# Patient Record
Sex: Female | Born: 1940 | Race: Black or African American | Hispanic: No | Smoking: Never smoker
Health system: Southern US, Community
[De-identification: ages and names within clinical notes are randomized; demographics above are authoritative.]

## PROBLEM LIST (undated history)

## (undated) DIAGNOSIS — S82891A Other fracture of right lower leg, initial encounter for closed fracture: Secondary | ICD-10-CM

## (undated) DIAGNOSIS — F329 Major depressive disorder, single episode, unspecified: Secondary | ICD-10-CM

## (undated) DIAGNOSIS — D696 Thrombocytopenia, unspecified: Secondary | ICD-10-CM

## (undated) DIAGNOSIS — F32A Depression, unspecified: Secondary | ICD-10-CM

## (undated) DIAGNOSIS — IMO0002 Reserved for concepts with insufficient information to code with codable children: Secondary | ICD-10-CM

## (undated) DIAGNOSIS — D472 Monoclonal gammopathy: Secondary | ICD-10-CM

## (undated) DIAGNOSIS — K219 Gastro-esophageal reflux disease without esophagitis: Secondary | ICD-10-CM

## (undated) DIAGNOSIS — F419 Anxiety disorder, unspecified: Secondary | ICD-10-CM

## (undated) DIAGNOSIS — R161 Splenomegaly, not elsewhere classified: Secondary | ICD-10-CM

## (undated) DIAGNOSIS — M1990 Unspecified osteoarthritis, unspecified site: Secondary | ICD-10-CM

## (undated) DIAGNOSIS — E669 Obesity, unspecified: Secondary | ICD-10-CM

## (undated) DIAGNOSIS — E7800 Pure hypercholesterolemia, unspecified: Secondary | ICD-10-CM

## (undated) DIAGNOSIS — K7581 Nonalcoholic steatohepatitis (NASH): Secondary | ICD-10-CM

## (undated) DIAGNOSIS — M797 Fibromyalgia: Secondary | ICD-10-CM

## (undated) HISTORY — DX: Pure hypercholesterolemia, unspecified: E78.00

## (undated) HISTORY — PX: ABDOMINAL HYSTERECTOMY: SHX81

## (undated) HISTORY — DX: Nonalcoholic steatohepatitis (NASH): K75.81

## (undated) HISTORY — DX: Other fracture of right lower leg, initial encounter for closed fracture: S82.891A

## (undated) HISTORY — DX: Thrombocytopenia, unspecified: D69.6

## (undated) HISTORY — DX: Depression, unspecified: F32.A

## (undated) HISTORY — DX: Reserved for concepts with insufficient information to code with codable children: IMO0002

## (undated) HISTORY — DX: Major depressive disorder, single episode, unspecified: F32.9

## (undated) HISTORY — DX: Anxiety disorder, unspecified: F41.9

## (undated) HISTORY — DX: Obesity, unspecified: E66.9

## (undated) HISTORY — DX: Gastro-esophageal reflux disease without esophagitis: K21.9

## (undated) HISTORY — DX: Monoclonal gammopathy: D47.2

## (undated) HISTORY — PX: PARTIAL HYSTERECTOMY: SHX80

## (undated) HISTORY — DX: Unspecified osteoarthritis, unspecified site: M19.90

## (undated) HISTORY — DX: Fibromyalgia: M79.7

## (undated) HISTORY — DX: Splenomegaly, not elsewhere classified: R16.1

---

## 1980-04-01 HISTORY — PX: BREAST BIOPSY: SHX20

## 1997-07-08 ENCOUNTER — Other Ambulatory Visit: Admission: RE | Admit: 1997-07-08 | Discharge: 1997-07-08 | Payer: Self-pay | Admitting: Gynecology

## 1998-05-24 ENCOUNTER — Other Ambulatory Visit: Admission: RE | Admit: 1998-05-24 | Discharge: 1998-05-24 | Payer: Self-pay | Admitting: Gynecology

## 1998-07-24 ENCOUNTER — Ambulatory Visit (HOSPITAL_COMMUNITY): Admission: RE | Admit: 1998-07-24 | Discharge: 1998-07-24 | Payer: Self-pay | Admitting: Gastroenterology

## 1998-07-24 ENCOUNTER — Encounter: Payer: Self-pay | Admitting: Gastroenterology

## 1998-08-10 ENCOUNTER — Ambulatory Visit (HOSPITAL_COMMUNITY): Admission: RE | Admit: 1998-08-10 | Discharge: 1998-08-10 | Payer: Self-pay | Admitting: Gastroenterology

## 1998-08-25 ENCOUNTER — Ambulatory Visit (HOSPITAL_COMMUNITY): Admission: RE | Admit: 1998-08-25 | Discharge: 1998-08-25 | Payer: Self-pay | Admitting: Gastroenterology

## 1999-10-26 ENCOUNTER — Other Ambulatory Visit: Admission: RE | Admit: 1999-10-26 | Discharge: 1999-10-26 | Payer: Self-pay | Admitting: Gynecology

## 2000-02-18 ENCOUNTER — Encounter: Payer: Self-pay | Admitting: Emergency Medicine

## 2000-02-18 ENCOUNTER — Emergency Department (HOSPITAL_COMMUNITY): Admission: EM | Admit: 2000-02-18 | Discharge: 2000-02-18 | Payer: Self-pay

## 2000-11-06 ENCOUNTER — Other Ambulatory Visit: Admission: RE | Admit: 2000-11-06 | Discharge: 2000-11-06 | Payer: Self-pay | Admitting: Gynecology

## 2001-07-27 ENCOUNTER — Ambulatory Visit (HOSPITAL_COMMUNITY): Admission: RE | Admit: 2001-07-27 | Discharge: 2001-07-27 | Payer: Self-pay | Admitting: Pulmonary Disease

## 2001-10-27 ENCOUNTER — Encounter: Payer: Self-pay | Admitting: Neurosurgery

## 2001-10-27 ENCOUNTER — Encounter: Admission: RE | Admit: 2001-10-27 | Discharge: 2001-10-27 | Payer: Self-pay | Admitting: Neurosurgery

## 2001-11-10 ENCOUNTER — Encounter: Admission: RE | Admit: 2001-11-10 | Discharge: 2001-11-10 | Payer: Self-pay | Admitting: Neurosurgery

## 2001-11-10 ENCOUNTER — Encounter: Payer: Self-pay | Admitting: Neurosurgery

## 2002-09-02 ENCOUNTER — Other Ambulatory Visit: Admission: RE | Admit: 2002-09-02 | Discharge: 2002-09-02 | Payer: Self-pay | Admitting: Gynecology

## 2002-12-18 ENCOUNTER — Encounter: Payer: Self-pay | Admitting: Emergency Medicine

## 2002-12-18 ENCOUNTER — Emergency Department (HOSPITAL_COMMUNITY): Admission: EM | Admit: 2002-12-18 | Discharge: 2002-12-18 | Payer: Self-pay | Admitting: Emergency Medicine

## 2003-01-27 ENCOUNTER — Ambulatory Visit (HOSPITAL_COMMUNITY): Admission: RE | Admit: 2003-01-27 | Discharge: 2003-01-27 | Payer: Self-pay | Admitting: Internal Medicine

## 2003-03-24 ENCOUNTER — Ambulatory Visit (HOSPITAL_COMMUNITY): Admission: RE | Admit: 2003-03-24 | Discharge: 2003-03-24 | Payer: Self-pay | Admitting: Family Medicine

## 2003-04-18 ENCOUNTER — Ambulatory Visit (HOSPITAL_COMMUNITY): Admission: RE | Admit: 2003-04-18 | Discharge: 2003-04-18 | Payer: Self-pay | Admitting: Pulmonary Disease

## 2003-06-03 ENCOUNTER — Ambulatory Visit (HOSPITAL_COMMUNITY): Admission: RE | Admit: 2003-06-03 | Discharge: 2003-06-03 | Payer: Self-pay | Admitting: Cardiology

## 2003-09-05 ENCOUNTER — Other Ambulatory Visit: Admission: RE | Admit: 2003-09-05 | Discharge: 2003-09-05 | Payer: Self-pay | Admitting: Gynecology

## 2004-02-29 ENCOUNTER — Other Ambulatory Visit: Admission: RE | Admit: 2004-02-29 | Discharge: 2004-02-29 | Payer: Self-pay | Admitting: Gynecology

## 2004-04-03 ENCOUNTER — Ambulatory Visit (HOSPITAL_COMMUNITY): Admission: RE | Admit: 2004-04-03 | Discharge: 2004-04-03 | Payer: Self-pay | Admitting: Family Medicine

## 2004-06-27 ENCOUNTER — Ambulatory Visit: Payer: Self-pay | Admitting: Orthopedic Surgery

## 2004-08-09 ENCOUNTER — Ambulatory Visit: Payer: Self-pay | Admitting: Orthopedic Surgery

## 2004-09-10 ENCOUNTER — Ambulatory Visit: Payer: Self-pay | Admitting: Orthopedic Surgery

## 2004-10-05 ENCOUNTER — Other Ambulatory Visit: Admission: RE | Admit: 2004-10-05 | Discharge: 2004-10-05 | Payer: Self-pay | Admitting: Gynecology

## 2004-12-17 ENCOUNTER — Ambulatory Visit: Payer: Self-pay | Admitting: Orthopedic Surgery

## 2004-12-19 ENCOUNTER — Ambulatory Visit (HOSPITAL_COMMUNITY): Admission: RE | Admit: 2004-12-19 | Discharge: 2004-12-19 | Payer: Self-pay | Admitting: Orthopedic Surgery

## 2004-12-24 ENCOUNTER — Ambulatory Visit: Payer: Self-pay | Admitting: Orthopedic Surgery

## 2005-02-04 ENCOUNTER — Ambulatory Visit: Payer: Self-pay | Admitting: Orthopedic Surgery

## 2005-07-31 ENCOUNTER — Encounter: Payer: Self-pay | Admitting: Gastroenterology

## 2005-10-07 ENCOUNTER — Other Ambulatory Visit: Admission: RE | Admit: 2005-10-07 | Discharge: 2005-10-07 | Payer: Self-pay | Admitting: Gynecology

## 2005-10-28 ENCOUNTER — Ambulatory Visit (HOSPITAL_COMMUNITY): Admission: RE | Admit: 2005-10-28 | Discharge: 2005-10-28 | Payer: Self-pay | Admitting: Pulmonary Disease

## 2005-11-14 ENCOUNTER — Encounter (HOSPITAL_COMMUNITY): Admission: RE | Admit: 2005-11-14 | Discharge: 2005-12-14 | Payer: Self-pay | Admitting: Neurosurgery

## 2005-12-16 ENCOUNTER — Encounter (HOSPITAL_COMMUNITY): Admission: RE | Admit: 2005-12-16 | Discharge: 2005-12-28 | Payer: Self-pay | Admitting: Neurosurgery

## 2006-10-21 ENCOUNTER — Other Ambulatory Visit: Admission: RE | Admit: 2006-10-21 | Discharge: 2006-10-21 | Payer: Self-pay | Admitting: Gynecology

## 2006-12-16 ENCOUNTER — Encounter: Admission: RE | Admit: 2006-12-16 | Discharge: 2006-12-16 | Payer: Self-pay | Admitting: Gastroenterology

## 2007-12-20 ENCOUNTER — Emergency Department (HOSPITAL_COMMUNITY): Admission: EM | Admit: 2007-12-20 | Discharge: 2007-12-20 | Payer: Self-pay | Admitting: Emergency Medicine

## 2008-08-30 HISTORY — PX: COLONOSCOPY: SHX174

## 2008-10-21 ENCOUNTER — Ambulatory Visit: Payer: Self-pay | Admitting: Gynecology

## 2008-10-21 ENCOUNTER — Other Ambulatory Visit: Admission: RE | Admit: 2008-10-21 | Discharge: 2008-10-21 | Payer: Self-pay | Admitting: Gynecology

## 2008-10-21 ENCOUNTER — Encounter: Payer: Self-pay | Admitting: Gynecology

## 2008-11-08 ENCOUNTER — Ambulatory Visit: Payer: Self-pay | Admitting: Gynecology

## 2009-10-24 ENCOUNTER — Ambulatory Visit: Payer: Self-pay | Admitting: Gynecology

## 2009-10-24 ENCOUNTER — Other Ambulatory Visit: Admission: RE | Admit: 2009-10-24 | Discharge: 2009-10-24 | Payer: Self-pay | Admitting: Gynecology

## 2010-03-23 ENCOUNTER — Emergency Department (HOSPITAL_COMMUNITY)
Admission: EM | Admit: 2010-03-23 | Discharge: 2010-03-23 | Payer: Self-pay | Source: Home / Self Care | Admitting: Emergency Medicine

## 2010-04-28 ENCOUNTER — Emergency Department (HOSPITAL_COMMUNITY)
Admission: EM | Admit: 2010-04-28 | Discharge: 2010-04-29 | Payer: Self-pay | Source: Home / Self Care | Admitting: Emergency Medicine

## 2010-05-21 DIAGNOSIS — S82891A Other fracture of right lower leg, initial encounter for closed fracture: Secondary | ICD-10-CM

## 2010-05-21 DIAGNOSIS — IMO0002 Reserved for concepts with insufficient information to code with codable children: Secondary | ICD-10-CM

## 2010-05-21 HISTORY — DX: Reserved for concepts with insufficient information to code with codable children: IMO0002

## 2010-05-21 HISTORY — DX: Other fracture of right lower leg, initial encounter for closed fracture: S82.891A

## 2010-05-24 ENCOUNTER — Ambulatory Visit (HOSPITAL_COMMUNITY)
Admission: RE | Admit: 2010-05-24 | Discharge: 2010-05-24 | Disposition: A | Discharge status: Home / Self Care | Payer: Medicare Other | Source: Ambulatory Visit | Attending: Pulmonary Disease | Admitting: Pulmonary Disease

## 2010-05-24 ENCOUNTER — Other Ambulatory Visit (HOSPITAL_COMMUNITY): Payer: Self-pay | Admitting: Pulmonary Disease

## 2010-05-24 DIAGNOSIS — M545 Low back pain: Secondary | ICD-10-CM

## 2010-05-24 DIAGNOSIS — M8448XA Pathological fracture, other site, initial encounter for fracture: Secondary | ICD-10-CM | POA: Insufficient documentation

## 2010-05-24 DIAGNOSIS — M549 Dorsalgia, unspecified: Secondary | ICD-10-CM | POA: Insufficient documentation

## 2010-05-25 ENCOUNTER — Other Ambulatory Visit (HOSPITAL_COMMUNITY): Payer: Self-pay | Admitting: Pulmonary Disease

## 2010-05-25 DIAGNOSIS — IMO0002 Reserved for concepts with insufficient information to code with codable children: Secondary | ICD-10-CM

## 2010-05-28 ENCOUNTER — Ambulatory Visit (HOSPITAL_COMMUNITY)
Admission: RE | Admit: 2010-05-28 | Discharge: 2010-05-28 | Payer: Medicare Other | Source: Ambulatory Visit | Attending: Pulmonary Disease | Admitting: Pulmonary Disease

## 2010-06-10 ENCOUNTER — Emergency Department (HOSPITAL_COMMUNITY): Payer: Medicare Other

## 2010-06-10 ENCOUNTER — Emergency Department (HOSPITAL_COMMUNITY)
Admission: EM | Admit: 2010-06-10 | Discharge: 2010-06-10 | Disposition: A | Discharge status: Home / Self Care | Payer: Medicare Other | Attending: Emergency Medicine | Admitting: Emergency Medicine

## 2010-06-10 DIAGNOSIS — R51 Headache: Secondary | ICD-10-CM | POA: Insufficient documentation

## 2010-06-10 DIAGNOSIS — F411 Generalized anxiety disorder: Secondary | ICD-10-CM | POA: Insufficient documentation

## 2010-06-10 DIAGNOSIS — E7800 Pure hypercholesterolemia, unspecified: Secondary | ICD-10-CM | POA: Insufficient documentation

## 2010-06-10 DIAGNOSIS — R079 Chest pain, unspecified: Secondary | ICD-10-CM | POA: Insufficient documentation

## 2010-06-10 DIAGNOSIS — R0602 Shortness of breath: Secondary | ICD-10-CM | POA: Insufficient documentation

## 2010-06-10 DIAGNOSIS — M129 Arthropathy, unspecified: Secondary | ICD-10-CM | POA: Insufficient documentation

## 2010-06-10 DIAGNOSIS — R5381 Other malaise: Secondary | ICD-10-CM | POA: Insufficient documentation

## 2010-06-10 DIAGNOSIS — R5383 Other fatigue: Secondary | ICD-10-CM | POA: Insufficient documentation

## 2010-06-10 DIAGNOSIS — Z79899 Other long term (current) drug therapy: Secondary | ICD-10-CM | POA: Insufficient documentation

## 2010-06-10 DIAGNOSIS — R0989 Other specified symptoms and signs involving the circulatory and respiratory systems: Secondary | ICD-10-CM | POA: Insufficient documentation

## 2010-06-10 DIAGNOSIS — R0609 Other forms of dyspnea: Secondary | ICD-10-CM | POA: Insufficient documentation

## 2010-06-10 LAB — BASIC METABOLIC PANEL
BUN: 11 mg/dL (ref 6–23)
CO2: 27 mEq/L (ref 19–32)
Calcium: 9.3 mg/dL (ref 8.4–10.5)
Chloride: 105 mEq/L (ref 96–112)
Creatinine, Ser: 0.85 mg/dL (ref 0.4–1.2)
GFR calc Af Amer: >60 mL/min (ref >60)
GFR calc non Af Amer: >60 mL/min (ref >60)
Glucose, Bld: 106 mg/dL — ABNORMAL HIGH (ref 70–99)
Potassium: 3.8 mEq/L (ref 3.5–5.1)
Sodium: 140 mEq/L (ref 135–145)

## 2010-06-10 LAB — DIFFERENTIAL
Basophils Absolute: 0 10*3/uL (ref 0.0–0.1)
Basophils Relative: 0 % (ref 0–1)
Eosinophils Relative: 0 % (ref 0–5)
Lymphocytes Relative: 1 % — ABNORMAL LOW (ref 12–46)
Lymphs Abs: 0.1 10*3/uL — ABNORMAL LOW (ref 0.7–4.0)
Monocytes Absolute: 0.3 10*3/uL (ref 0.1–1.0)
Monocytes Relative: 3 % (ref 3–12)

## 2010-06-10 LAB — POCT CARDIAC MARKERS
CKMB, poc: <1 ng/mL — ABNORMAL LOW (ref 1.0–8.0)
CKMB, poc: <1 ng/mL — ABNORMAL LOW (ref 1.0–8.0)
Myoglobin, poc: 77.3 ng/mL (ref 12–200)
Troponin i, poc: <0.05 ng/mL (ref 0.00–0.09)
Troponin i, poc: <0.05 ng/mL (ref 0.00–0.09)

## 2010-06-10 LAB — CBC
HCT: 37.9 % (ref 36.0–46.0)
MCH: 32.6 pg (ref 26.0–34.0)
MCV: 95.7 fL (ref 78.0–100.0)
Platelets: 103 10*3/uL — ABNORMAL LOW (ref 150–400)
RBC: 3.96 MIL/uL (ref 3.87–5.11)
RDW: 12.5 % (ref 11.5–15.5)
WBC: 10.8 10*3/uL — ABNORMAL HIGH (ref 4.0–10.5)

## 2010-06-10 MED ORDER — IOHEXOL 300 MG/ML  SOLN
100.0000 mL | Freq: Once | INTRAMUSCULAR | Status: AC | PRN
  Administered 2010-06-10: 100 mL via INTRAVENOUS

## 2010-08-17 NOTE — Cardiovascular Report (Signed)
NAME:  Melanie Porter, Melanie Porter                         ACCOUNT NO.:  [id number]   MEDICAL RECORD NO.:  [id number]                   PATIENT TYPE:  OIB   LOCATION:  2899                                 FACILITY:  [facility]   PHYSICIAN:  Madaline Savage, M.D.             DATE OF BIRTH:  June 07, 1940   DATE OF PROCEDURE:  06/03/2003  DATE OF DISCHARGE:  06/03/2003                              CARDIAC CATHETERIZATION   PROCEDURES PERFORMED:  1. Selective coronary angiography by Judkins technique.  2. Retrograde left heart catheterization.  3. Left ventricular angiography.  4. Angio-Seal femoral arteriotomy of the right femoral artery.   CARDIOLOGIST:  Madaline Savage, M.D.   COMPLICATIONS:  None.   ENTRY SITE:  Right femoral.   CONTRAST MATERIAL:  Dye used; Omnipaque.   PATIENT PROFILE:  [name] is a 70 year old obese woman, 5 feet 2 inches  tall, 261 pounds, who has multiple complaints including her back, her spine,  her knees, her ankles, and has some difficulty sleeping.  A recent  Cardiolite stress test was performed to assess her cardiac status in view of  her symptoms and her ejection fraction was noted to be 69% and there was  noted to be mild inferolateral scar from base to apex.   The patient was apprised of her situation and after talking it over with  some of her family members she has decided to go ahead with cardiac  catheterization.  She is a patient of Dr. Shaune Pollack.   RESULTS:   PRESSURES:  The left ventricular pressure was 130/11, end-diastolic pressure  18.  Central aortic pressure 130/65, mean of 95.  No aortic valve gradient  by pullback technique.   ANGIOGRAPHIC DATA:  Left Main Coronary Artery:  The left main coronary  artery was patent.   Left Anterior Descending Coronary Artery:  The left anterior descending  coronary artery courses the cardiac apex and gave rise to one diagonal  branch, and both vessels were normal.   Left Circumflex Coronary Artery:  The  left circumflex coronary artery is a  large caliber vessel, which bifurcates from its midportion down into a  circumflex continuation branch and a bifurcating obtuse marginal branch.  No  lesions were seen.   Right Coronary Artery:  The right coronary artery is a relatively small  caliber vessel, codominant with the circumflex, which showed no lesions.   Left ventricular angiogram showed normal LV systolic function.  Ejection  fraction 60-70% with catheter-induced mitral regurgitation, but no prolapse  and no LV thrombus.   AORTOGRAPHIC DATA:  Abdominal aortography showed no evidence of an abdominal  aortic aneurysm.  Normal renal arteries and normal common iliacs.   A right anterior oblique 45-degree view of the right femoral artery showed  the sheath to insert above the bifurcation point and we then proceeded with  a 6 French Angio-Seal arteriotomy, which was successful and uncomplicated.   FINAL IMPRESSION:  1. Angiographically patent coronary arteries.  2. Normal left ventricular systolic function.  3. Normal renal arteries.  4. Normal abdominal aorta.   PLAN:  The patient will recover for approximately two hours and ambulated,  and then is a candidate for discharge.                                               Madaline Savage, M.D.    WHG/MEDQ  D:  06/03/2003  T:  06/04/2003  Job:  [id number]   cc:   Ramon Dredge L. Juanetta Gosling, M.D.  [address]  [place]  [place] [address]  Fax: [phone]   [facility]

## 2010-08-17 NOTE — H&P (Signed)
NAME:  Melanie Porter, Melanie Porter                         ACCOUNT NO.:  [id number]   MEDICAL RECORD NO.:  [id number]                   PATIENT TYPE:  OIB   LOCATION:                                       FACILITY:  [facility]   PHYSICIAN:  Lionel December, M.D.                 DATE OF BIRTH:  08-18-1940   DATE OF ADMISSION:  01/27/2003  DATE OF DISCHARGE:                                HISTORY & PHYSICAL   CHIEF COMPLAINT:  Acid reflux.   HISTORY OF PRESENT ILLNESS:  [name] is a 70 year old African-  American female who presents to our office for evaluation of chronic acid  reflux.  She is  complaining of heartburn on a daily basis, all the time,  especially postprandially.  She has had these symptoms for more than a year,  and has tried numerous over-the-counter regimen such as Tagamet, Zantac, and  Prilosec, as well as several PPI's including Nexium, Aciphex, and Protonix.  Overall, she reports Aciphex works the best for her, and she has been on  that for two months; however, it still does not control all of her symptoms.  She denies any dysphagia or odynophagia.  She denies any nausea or vomiting.  She reports her bowel movements are soft and brown and daily, and she denies  any hematochezia or melena.  She does report a history of peptic ulcer  disease, and EGD approximately 10 years ago in [place].  She also reports  she had a normal colonoscopy by Dr. Loreta Ave in [place] several years ago;  however, she does not know the date.  Weight remains steadily increasing,  and she reports stable appetite.   PAST MEDICAL HISTORY:  1. Hypercholesterolemia.  2. GERD.  3. Depression.  4. Anxiety.  5. Currently on hormone replacement therapy.  6. Hysterectomy.  7. Left breast benign biopsy.   CURRENT MEDICATIONS:  1. Lipitor 10 mg daily.  2. Xanax 0.25 mg b.i.d.  3. Aspirin 81 mg daily.  4. Prozac 20 mg daily.  5. Estratest one tablet p.o. daily.  6. Vitamins A and D.  7. Calcium.  8.  Multivitamin.  9. Tylenol ES 500 mg p.r.n.  10.      Aciphex 20 mg daily.   ALLERGIES:  CODEINE.   FAMILY HISTORY:  No known family history of colorectal carcinoma.  Mother is  deceased at age 52 with a CVA and cerebral aneurysm.  Father is deceased at  36 with CAD.  She has eight sisters and two brothers, all of whom are  healthy.   SOCIAL HISTORY:  She is currently married, and has been for 40 years.  She  has two grown children, both of whom are healthy.  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  She denies any tobacco, alcohol, or drug  use.   REVIEW OF SYSTEMS:  CONSTITUTIONAL:  She reports her weight is slightly  increasing.  She denies any fatigue.  CARDIOVASCULAR:  She denies any chest  pain or palpitations.  PULMONARY:  She denies any shortness of breath,  dyspnea, or cough.  SKIN:  She denies any rash or jaundice.  GI:  See HPI.   PHYSICAL EXAMINATION:  VITAL SIGNS:  Weight 265 pounds, height 62 inches.  Temperature 97.0, blood pressure 130/80, respirations 72.  GENERAL:  Melanie Porter is a 70 year old African-American female who appears  her stated age.  She is alert, pleasant, cooperative, and in no acute  distress.  HEENT:  Sclerae clear, nonicteric.  Conjunctivae pink.  NECK:  Supple without mass or thyromegaly.  CHEST:  Heart regular rate and rhythm without murmurs, clicks, rubs, or  gallops.  LUNGS:  Clear to auscultation bilaterally.  ABDOMEN:  Obese with positive bowel sounds x4.  Positive excoriations where  she has been itching from hives.  Positive bowel sounds x4.  Soft,  nontender, nondistended.  No organomegaly.  However, exam was limited due to  patient's body habitus.  EXTREMITIES:  2+ pedal pulses bilaterally.  No pedal edema.   ASSESSMENT:  1. [name] is a 70 year old African-American female with     intractable gastroesophageal reflux disease and history of  peptic ulcer     disease.  Given her history of peptic ulcer disease, as well as her     epigastric pain and dyspepsia, I felt it would be beneficial at this time     to proceed with EGD by Dr. Dionicia Abler to rule out peptic ulcer disease, reflux     esophagitis, or changes of Barrett's esophagus.  I discussed this     procedure with [name], as well as risks and benefits to include, but     not limited to, __________ and infection.  She agrees with this plan.     She should also continue her Aciphex 20 mg daily, since this PPI is what     has worked best so far for her.  This may need to be increased to b.i.d.;     however, will wait for procedure results initially.  Also, she was     instructed to hold her aspirin three days prior to the procedure.  2. Obesity.  I discussed with [name] the need to decrease her caloric     intake, increase her exercise, and actively try to lose weight.  I feel     like this would help tremendously with her reflux symptoms, as well as     her osteoarthritis.  Not mentioned above, she was also on Vioxx for her     pain, and Actonel, and I have asked her to avoid NSAID's until EGD by Dr.     Dionicia Abler.  Weight loss would also help her osteoarthritic symptoms, as well.     She is currently contemplating this.   RECOMMENDATIONS:  1. Will try to obtain colonoscopy and EGD records from Dr. Kenna Gilbert office in     [place].  2. Recommended weight loss diet and exercise.  3. We will give her literature today on gastroesophageal reflux disease.  4. She is to continue Aciphex 20 mg.  I have given her a prescription for     #90 per her insurance company's request     with one refill.  She is to take 30 minutes before breakfast.  I also  have given her Aciphex samples today.  5. She was scheduled for EGD with Dr. Dionicia Abler at [facility].  Consent     will be obtained.  We would like to thank Dr. Juanetta Gosling for this kind referral.      _____________________________________  ___________________________________________  Nicholas Lose, N.P.               Lionel December, M.D.   KC/MEDQ  D:  01/14/2003  T:  01/14/2003  Job:  [id number]   cc:   Dr. Dionicia Abler   Dr. Juanetta Gosling

## 2010-08-30 ENCOUNTER — Ambulatory Visit (INDEPENDENT_AMBULATORY_CARE_PROVIDER_SITE_OTHER): Payer: Medicare Other | Admitting: Gynecology

## 2010-08-30 DIAGNOSIS — B373 Candidiasis of vulva and vagina: Secondary | ICD-10-CM

## 2010-08-30 DIAGNOSIS — N898 Other specified noninflammatory disorders of vagina: Secondary | ICD-10-CM

## 2010-10-26 ENCOUNTER — Other Ambulatory Visit (HOSPITAL_COMMUNITY)
Admission: RE | Admit: 2010-10-26 | Discharge: 2010-10-26 | Disposition: A | Discharge status: Home / Self Care | Payer: Medicare Other | Source: Ambulatory Visit | Attending: Gynecology | Admitting: Gynecology

## 2010-10-26 ENCOUNTER — Encounter: Payer: Self-pay | Admitting: Gynecology

## 2010-10-26 ENCOUNTER — Ambulatory Visit (INDEPENDENT_AMBULATORY_CARE_PROVIDER_SITE_OTHER): Payer: Medicare Other | Admitting: Gynecology

## 2010-10-26 VITALS — BP 132/88 | Ht 60.0 in | Wt 228.0 lb

## 2010-10-26 DIAGNOSIS — Z780 Asymptomatic menopausal state: Secondary | ICD-10-CM

## 2010-10-26 DIAGNOSIS — L538 Other specified erythematous conditions: Secondary | ICD-10-CM

## 2010-10-26 DIAGNOSIS — L304 Erythema intertrigo: Secondary | ICD-10-CM

## 2010-10-26 DIAGNOSIS — N949 Unspecified condition associated with female genital organs and menstrual cycle: Secondary | ICD-10-CM

## 2010-10-26 DIAGNOSIS — Z124 Encounter for screening for malignant neoplasm of cervix: Secondary | ICD-10-CM

## 2010-10-26 DIAGNOSIS — Z01419 Encounter for gynecological examination (general) (routine) without abnormal findings: Secondary | ICD-10-CM | POA: Insufficient documentation

## 2010-10-26 DIAGNOSIS — Z1211 Encounter for screening for malignant neoplasm of colon: Secondary | ICD-10-CM

## 2010-10-26 DIAGNOSIS — R102 Pelvic and perineal pain: Secondary | ICD-10-CM

## 2010-10-26 MED ORDER — TOLNAFTATE 1 % EX AERP: INHALATION_SPRAY | CUTANEOUS | Status: AC | PRN

## 2010-10-26 NOTE — Progress Notes (Signed)
The patient presented to the office with complaint of left lower quadrant discomfort and pruritus in her groin area. She patient's records outlining her review of systems as well as her past medical history current medications. Physical examination: HEENT unremarkable Lungs care clear to auscultation rhonchi or wheezes Heart regular rate and rhythm no murmurs or gallops Abdomen soft nontender no rebound guarding pendulous Pelvic Bartholin urethra and Skene glands within normal limits Cervix: Answered Vaginal cuff intact bimanual examination incomplete due to patient's obesity. Rectal exam Hemoccult testing done.  Assessment: Patient will be placed on tinactum cream to apply to her intertrigo her groin on a when necessary basis. She'll return back next week for pelvic ultrasound to better assess her left adnexa. She is in the process of being evaluated by a neurosurgeon because of her back and scheduled for CT scan next week. All her lab were done recently with her recent hospitalization as well as by her primary physician. She she was recently placed on Lipitor by her primary physician will have follow up her liver function tests in 3 months. She will return back in a few weeks for ultrasound and further discussion. She was instructed to continue her calcium and vitamin D for osteoporosis prevention as well as weightbearing exercises 3 times a week.

## 2010-10-26 NOTE — Progress Notes (Signed)
Addended by: Bertram Savin A on: 10/26/2010 10:09 AM   Modules accepted: Orders

## 2010-11-09 ENCOUNTER — Other Ambulatory Visit: Payer: Medicare Other

## 2010-11-09 ENCOUNTER — Ambulatory Visit (INDEPENDENT_AMBULATORY_CARE_PROVIDER_SITE_OTHER): Payer: Medicare Other | Admitting: Gynecology

## 2010-11-09 DIAGNOSIS — R1032 Left lower quadrant pain: Secondary | ICD-10-CM

## 2010-11-09 NOTE — Progress Notes (Signed)
Patient 70 years of age presented to the office today for an ultrasound of the fact that time of her annual exam she had brought up to my attention that she was having on-and-off left lower quadrant discomfort. Patient has a history of total vaginal hysterectomy several years ago her colonoscopy was 2 years ago which was normal her mammogram was done this year which was normal and her bone density was done early part of this year ordered by another provider and she states the results were normal. The last bone density study we have was in office in 2010 which had a normal bone mineralization. Patient is overweight so to better assess her adnexa she was asked to come in today for an ultrasound. The ultrasound demonstrated a uterus that measured 14.6 x 13.2 x 14.3 mm both ovaries appeared to be normal absent uterus. Patient was reassured that from a gynecological standpoint there is nothing contributory runoff left lower quadrant discomfort. 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 Her recent Pap smear was also normal as well. We'll see her back in the office in one year or when necessary.

## 2010-11-23 ENCOUNTER — Encounter: Payer: Self-pay | Admitting: Gynecology

## 2010-12-17 ENCOUNTER — Encounter: Payer: Self-pay | Admitting: Gynecology

## 2011-09-27 ENCOUNTER — Ambulatory Visit (HOSPITAL_COMMUNITY)
Admission: RE | Admit: 2011-09-27 | Discharge: 2011-09-27 | Disposition: A | Discharge status: Home / Self Care | Payer: Medicare Other | Source: Ambulatory Visit | Attending: Family Medicine | Admitting: Family Medicine

## 2011-09-27 ENCOUNTER — Other Ambulatory Visit (HOSPITAL_COMMUNITY): Payer: Self-pay | Admitting: Family Medicine

## 2011-09-27 DIAGNOSIS — M546 Pain in thoracic spine: Secondary | ICD-10-CM | POA: Insufficient documentation

## 2011-09-27 DIAGNOSIS — M47814 Spondylosis without myelopathy or radiculopathy, thoracic region: Secondary | ICD-10-CM | POA: Insufficient documentation

## 2011-10-09 ENCOUNTER — Other Ambulatory Visit (HOSPITAL_COMMUNITY): Payer: Self-pay | Admitting: Family Medicine

## 2011-10-09 DIAGNOSIS — M546 Pain in thoracic spine: Secondary | ICD-10-CM

## 2011-10-09 DIAGNOSIS — G8929 Other chronic pain: Secondary | ICD-10-CM

## 2011-10-11 ENCOUNTER — Ambulatory Visit (HOSPITAL_COMMUNITY): Payer: Medicare Other

## 2011-10-16 ENCOUNTER — Other Ambulatory Visit (HOSPITAL_COMMUNITY): Payer: Self-pay | Admitting: Family Medicine

## 2011-10-16 ENCOUNTER — Ambulatory Visit (HOSPITAL_COMMUNITY)
Admission: RE | Admit: 2011-10-16 | Discharge: 2011-10-16 | Disposition: A | Discharge status: Home / Self Care | Payer: Medicare Other | Source: Ambulatory Visit | Attending: Family Medicine | Admitting: Family Medicine

## 2011-10-16 DIAGNOSIS — G8929 Other chronic pain: Secondary | ICD-10-CM

## 2011-10-16 DIAGNOSIS — M546 Pain in thoracic spine: Secondary | ICD-10-CM

## 2011-10-16 DIAGNOSIS — M79609 Pain in unspecified limb: Secondary | ICD-10-CM | POA: Insufficient documentation

## 2011-10-16 MED ORDER — GADOBENATE DIMEGLUMINE 529 MG/ML IV SOLN
20.0000 mL | Freq: Once | INTRAVENOUS | Status: AC | PRN
  Administered 2011-10-16: 20 mL via INTRAVENOUS

## 2011-10-17 LAB — POCT I-STAT, CHEM 8
Creatinine, Ser: 0.7 mg/dL (ref 0.50–1.10)
Glucose, Bld: 106 mg/dL — ABNORMAL HIGH (ref 70–99)
HCT: 42 % (ref 36.0–46.0)
Hemoglobin: 14.3 g/dL (ref 12.0–15.0)
Potassium: 4.1 mEq/L (ref 3.5–5.1)
Sodium: 140 mEq/L (ref 135–145)
TCO2: 20 mmol/L (ref 0–100)

## 2012-01-14 ENCOUNTER — Telehealth (HOSPITAL_COMMUNITY): Payer: Self-pay | Admitting: Oncology

## 2012-01-29 ENCOUNTER — Encounter (HOSPITAL_COMMUNITY): Payer: Medicare Other | Attending: Oncology | Admitting: Oncology

## 2012-01-29 VITALS — BP 141/55 | HR 66 | Temp 97.8°F | Resp 18 | Wt 237.2 lb

## 2012-01-29 DIAGNOSIS — E669 Obesity, unspecified: Secondary | ICD-10-CM | POA: Insufficient documentation

## 2012-01-29 DIAGNOSIS — D696 Thrombocytopenia, unspecified: Secondary | ICD-10-CM

## 2012-01-29 DIAGNOSIS — M1710 Unilateral primary osteoarthritis, unspecified knee: Secondary | ICD-10-CM | POA: Insufficient documentation

## 2012-01-29 DIAGNOSIS — M549 Dorsalgia, unspecified: Secondary | ICD-10-CM | POA: Insufficient documentation

## 2012-01-29 LAB — CBC WITH DIFFERENTIAL/PLATELET
Basophils Relative: 1 % (ref 0–1)
Eosinophils Absolute: 0.3 10*3/uL (ref 0.0–0.7)
Hemoglobin: 13.1 g/dL (ref 12.0–15.0)
MCH: 32.8 pg (ref 26.0–34.0)
MCHC: 33.2 g/dL (ref 30.0–36.0)
Monocytes Relative: 11 % (ref 3–12)
Neutro Abs: 3.4 10*3/uL (ref 1.7–7.7)
Neutrophils Relative %: 48 % (ref 43–77)
Platelets: 88 10*3/uL — ABNORMAL LOW (ref 150–400)
RBC: 4 MIL/uL (ref 3.87–5.11)

## 2012-01-29 LAB — SEDIMENTATION RATE: Sed Rate: 30 mm/hr — ABNORMAL HIGH (ref 0–22)

## 2012-01-29 NOTE — Patient Instructions (Addendum)
[facility]  Discharge Instructions  RECOMMENDATIONS MADE BY THE CONSULTANT AND ANY TEST RESULTS WILL BE SENT TO YOUR REFERRING DOCTOR.   EXAM FINDINGS BY MD TODAY AND SIGNS AND SYMPTOMS TO REPORT TO CLINIC OR PRIMARY MD:  We are doing lab work today. If this does not give [place] and answer we will do a CT scan next Friday   INSTRUCTIONS GIVEN AND DISCUSSED: You have 2 bottles of prep that you will need to drink starting 2 hrs prior to your xray time  SPECIAL INSTRUCTIONS/FOLLOW-UP: 3 weeks to see our PA   I acknowledge that I have been informed and understand all the instructions given to me and received a copy. I do not have any more questions at this time, but understand that I may call the Specialty Clinic at [facility] at [phone] during business hours should I have any further questions or need assistance in obtaining follow-up care.    __________________________________________  _____________  __________ Signature of Patient or Authorized Representative            Date                   Time    __________________________________________ Nurse's Signature

## 2012-01-29 NOTE — Progress Notes (Signed)
Problem #1 thrombocytopenia unclear etiology Problem #2 obesity Problem #3 chronic back pain with which she walks with a cane and has a wide-based gait Problem #4 DJD of the knees. This lady had a platelet count of just over 100,000 last year. Labs were sent to [place] but did not include a platelet count from this year. She has no bleeding issues from the GI or GU tracts. She is chronically overweight. She has not lost nor gained weight in the last 6 months. She has no fevers, night sweats, chills, masses that she is aware of. She is not on any new medication. She is not aware of extra marital sexual encounters for her or her husband. She is not a drug user. She's never had a transfusion. She had a simple hysterectomy many years ago for excessive uterine bleeding. She does not crave ice. She is not have a sore tongue her sore gums. She does have some dryness to her throat she states with some trouble swallowing.  She has 2 children in good health though her son is a diabetic. He is much better than he used to be. She and her husband have been married many years but have not been sexually active since 1989. She is not a smoker or drinker. 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 2 years ago she also lost 85 pounds with the help of a Research scientist (medical) in [place] but regained all of that. She states that 2 years ago after the weight reduction she was placed on medication which increased her appetite but that she took only briefly. She cannot remember the name of the medication. Vital signs are recorded. She has no lymphadenopathy. She has no obvious thyromegaly. Breast exam is negative for masses. There is a scar in the upper inner quadrant of the left breast which is well-healed. Lungs are clear. Heart shows a grade 1/6 systolic murmur. I did not hear an S3 gallop. She has a regular rhythm and rate. Abdomen is obese without obvious masses. Bowel sounds are diminished but present. Pulses are 2+ in the dorsalis  pedis area but posterior tibialis pulses are difficult to feel if at all. She has no leg edema no arm edema. She has fairly good dental hygiene. Pupils are equally round and reactive to light facial symmetry is intact.  She needs blood work and a CT scan and will return to see [place] in a few weeks.

## 2012-01-30 LAB — ANA: Anti Nuclear Antibody(ANA): POSITIVE — AB

## 2012-01-30 LAB — FOLATE: Folate: >20 ng/mL

## 2012-01-30 LAB — ANTI-NUCLEAR AB-TITER (ANA TITER): ANA Titer 1: NEGATIVE

## 2012-01-30 LAB — HIV ANTIBODY (ROUTINE TESTING W REFLEX): HIV: NONREACTIVE

## 2012-01-30 LAB — IRON AND TIBC
Iron: 73 ug/dL (ref 42–135)
TIBC: 259 ug/dL (ref 250–470)

## 2012-01-30 LAB — RHEUMATOID FACTOR: Rhuematoid fact SerPl-aCnc: 11 IU/mL (ref <=14)

## 2012-01-31 ENCOUNTER — Telehealth (HOSPITAL_COMMUNITY): Payer: Self-pay

## 2012-01-31 NOTE — Telephone Encounter (Signed)
Spoke to patient regarding CT scan that is scheduled and that we want her to go ahead and do the scan as scheduled.  Patient verbalized understanding and confirmed appointment time and date and that she had her preparation to drink prior to her scan.

## 2012-02-04 ENCOUNTER — Encounter: Payer: Self-pay | Admitting: Gynecology

## 2012-02-04 ENCOUNTER — Ambulatory Visit (INDEPENDENT_AMBULATORY_CARE_PROVIDER_SITE_OTHER): Payer: Medicare Other | Admitting: Gynecology

## 2012-02-04 VITALS — BP 124/80 | Ht 60.0 in | Wt 234.0 lb

## 2012-02-04 DIAGNOSIS — K219 Gastro-esophageal reflux disease without esophagitis: Secondary | ICD-10-CM | POA: Insufficient documentation

## 2012-02-04 DIAGNOSIS — Z780 Asymptomatic menopausal state: Secondary | ICD-10-CM

## 2012-02-04 DIAGNOSIS — E785 Hyperlipidemia, unspecified: Secondary | ICD-10-CM | POA: Insufficient documentation

## 2012-02-04 DIAGNOSIS — M797 Fibromyalgia: Secondary | ICD-10-CM | POA: Insufficient documentation

## 2012-02-04 DIAGNOSIS — N952 Postmenopausal atrophic vaginitis: Secondary | ICD-10-CM

## 2012-02-04 DIAGNOSIS — M1990 Unspecified osteoarthritis, unspecified site: Secondary | ICD-10-CM | POA: Insufficient documentation

## 2012-02-04 NOTE — Patient Instructions (Signed)
Bone Densitometry Bone densitometry is a special X-ray that measures your bone density and can be used to help predict your risk of bone fractures. This test is used to determine bone mineral content and density to diagnose osteoporosis. Osteoporosis is the loss of bone that may cause the bone to become weak. Osteoporosis commonly occurs in women entering menopause. However, it may be found in men and in people with other diseases. PREPARATION FOR TEST No preparation necessary. WHO SHOULD BE TESTED?  All women older than 65.  Postmenopausal women (50 to 65) with risk factors for osteoporosis.  People with a previous fracture caused by normal activities.  People with a small body frame (less than 127 poundsor a body mass index [BMI] of less than 21).  People who have a parent with a hip fracture or history of osteoporosis.  People who smoke.  People who have rheumatoid arthritis.  Anyone who engages in excessive alcohol use (more than 3 drinks most days).  Women who experience early menopause. WHEN SHOULD YOU BE RETESTED? Current guidelines suggest that you should wait at least 2 years before doing a bone density test again if your first test was normal.Recent studies indicated that women with normal bone density may be able to wait a few years before needing to repeat a bone density test. You should discuss this with your caregiver.  NORMAL FINDINGS   Normal: less than standard deviation below normal (greater than -1).  Osteopenia: 1 to 2.5 standard deviations below normal (-1 to -2.5).  Osteoporosis: greater than 2.5 standard deviations below normal (less than -2.5). Test results are reported as a "T score" and a "Z score."The T score is a number that compares your bone density with the bone density of healthy, young women.The Z score is a number that compares your bone density with the scores of women who are the same age, gender, and race.  Ranges for normal findings may vary  among different laboratories and hospitals. You should always check with your doctor after having lab work or other tests done to discuss the meaning of your test results and whether your values are considered within normal limits. MEANING OF TEST  Your caregiver will go over the test results with you and discuss the importance and meaning of your results, as well as treatment options and the need for additional tests if necessary. OBTAINING THE TEST RESULTS It is your responsibility to obtain your test results. Ask the lab or department performing the test when and how you will get your results. Document Released: 04/09/2004 Document Revised: 06/10/2011 Document Reviewed: 05/02/2010 [employer] Patient Information 2013 [employer].  

## 2012-02-04 NOTE — Progress Notes (Signed)
Melanie Porter 08-01-1940 [id number]   History:    71 y.o.  for annual gyn exam who was last seen in 2012. Patient is complaining of some vaginal dryness. Patient has history of transvaginal hysterectomy in the past. Her colonoscopy was 4 years ago. Her mammogram was normal in 2012 and she states she does her monthly self breast examination. Her bone density study was normal in 2011. She had an ultrasound last year because of her obesity to better assess her ovaries and there were normal. Her primary physician Dr. Phillips Odor has been treating the patient for her arthritis, fibromyalgia, GERD and hyperlipidemia and has been doing all her lab work.  Past medical history,surgical history, family history and social history were all reviewed and documented in the EPIC chart.  Gynecologic History No LMP recorded. Patient is postmenopausal. Contraception: none Last Pap: 2012. Results were: normal Last mammogram: 2012. Results were: normal  Obstetric History OB History    Grav Para Term Preterm Abortions TAB SAB Ect Mult Living   2 2 2       2      # Outc Date GA Lbr Len/2nd Wgt Sex Del Anes PTL Lv   1 TRM     M SVD  No Yes   2 TRM     F SVD  No Yes       ROS: A ROS was performed and pertinent positives and negatives are included in the history.  GENERAL: No fevers or chills. HEENT: No change in vision, no earache, sore throat or sinus congestion. NECK: No pain or stiffness. CARDIOVASCULAR: No chest pain or pressure. No palpitations. PULMONARY: No shortness of breath, cough or wheeze. GASTROINTESTINAL: No abdominal pain, nausea, vomiting or diarrhea, melena or bright red blood per rectum. GENITOURINARY: No urinary frequency, urgency, hesitancy or dysuria. MUSCULOSKELETAL: No joint or muscle pain, no back pain, no recent trauma. DERMATOLOGIC: No rash, no itching, no lesions. ENDOCRINE: No polyuria, polydipsia, no heat or cold intolerance. No recent change in weight. HEMATOLOGICAL: No anemia or easy  bruising or bleeding. NEUROLOGIC: No headache, seizures, numbness, tingling or weakness. PSYCHIATRIC: No depression, no loss of interest in normal activity or change in sleep pattern.     Exam: chaperone present  BP 124/80  Ht 5' (1.524 m)  Wt 234 lb (106.142 kg)  BMI 45.70 kg/m2  Body mass index is 45.70 kg/(m^2).  General appearance : Well developed well nourished female. No acute distress HEENT: Neck supple, trachea midline, no carotid bruits, no thyroidmegaly Lungs: Clear to auscultation, no rhonchi or wheezes, or rib retractions  Heart: Regular rate and rhythm, no murmurs or gallops Breast:Examined in sitting and supine position were symmetrical in appearance, no palpable masses or tenderness,  no skin retraction, no nipple inversion, no nipple discharge, no skin discoloration, no axillary or supraclavicular lymphadenopathy Abdomen: no palpable masses or tenderness, no rebound or guarding Extremities: no edema or skin discoloration or tenderness  Pelvic:  Bartholin, Urethra, Skene Glands: Within normal limits             Vagina: No gross lesions or discharge, first degree cystocele  Cervix: Absent  Uterus absent  Adnexa  Without masses or tenderness  Anus and perineum  normal   Rectovaginal  normal sphincter tone without palpated masses or tenderness             Hemoccult cards provided.     Assessment/Plan:  71 y.o. female for annual exam with some mild vaginal atrophy. No Pap smear done  today. New Pap smear screening guidelines discussed. Patient would know prior history of abnormal Pap smears. Hemoccult cards were provided her to symmetrical the office for testing. Requisition was provided for to schedule her mammogram as well as her bone density study. We discussed importance of calcium and vitamin D and regular exercise for osteoporosis prevention.    Ok Edwards MD, 2:30 PM 02/04/2012

## 2012-02-07 ENCOUNTER — Ambulatory Visit (HOSPITAL_COMMUNITY)
Admission: RE | Admit: 2012-02-07 | Discharge: 2012-02-07 | Disposition: A | Discharge status: Home / Self Care | Payer: Medicare Other | Source: Ambulatory Visit | Attending: Oncology | Admitting: Oncology

## 2012-02-07 DIAGNOSIS — D696 Thrombocytopenia, unspecified: Secondary | ICD-10-CM | POA: Insufficient documentation

## 2012-02-07 DIAGNOSIS — K769 Liver disease, unspecified: Secondary | ICD-10-CM | POA: Insufficient documentation

## 2012-02-07 LAB — POCT I-STAT, CHEM 8
Calcium, Ion: 1.22 mmol/L (ref 1.13–1.30)
Glucose, Bld: 101 mg/dL — ABNORMAL HIGH (ref 70–99)
HCT: 38 % (ref 36.0–46.0)
Hemoglobin: 12.9 g/dL (ref 12.0–15.0)
TCO2: 23 mmol/L (ref 0–100)

## 2012-02-07 MED ORDER — IOHEXOL 300 MG/ML  SOLN
100.0000 mL | Freq: Once | INTRAMUSCULAR | Status: AC | PRN
  Administered 2012-02-07: 100 mL via INTRAVENOUS

## 2012-02-18 ENCOUNTER — Encounter: Payer: Medicare Other | Admitting: Gynecology

## 2012-02-19 ENCOUNTER — Telehealth (HOSPITAL_COMMUNITY): Payer: Self-pay

## 2012-02-19 ENCOUNTER — Telehealth: Payer: Self-pay

## 2012-02-19 ENCOUNTER — Encounter (HOSPITAL_COMMUNITY): Payer: Self-pay | Admitting: Oncology

## 2012-02-19 ENCOUNTER — Encounter (HOSPITAL_COMMUNITY): Payer: Medicare Other | Attending: Oncology | Admitting: Oncology

## 2012-02-19 VITALS — BP 113/62 | HR 78 | Temp 98.0°F | Resp 24 | Wt 234.1 lb

## 2012-02-19 DIAGNOSIS — K7460 Unspecified cirrhosis of liver: Secondary | ICD-10-CM

## 2012-02-19 DIAGNOSIS — D696 Thrombocytopenia, unspecified: Secondary | ICD-10-CM | POA: Insufficient documentation

## 2012-02-19 HISTORY — DX: Thrombocytopenia, unspecified: D69.6

## 2012-02-19 NOTE — Telephone Encounter (Signed)
appt made for 11.25

## 2012-02-19 NOTE — Patient Instructions (Addendum)
[facility]  Discharge Instructions  RECOMMENDATIONS MADE BY THE CONSULTANT AND ANY TEST RESULTS WILL BE SENT TO YOUR REFERRING DOCTOR.   EXAM FINDINGS BY MD TODAY AND SIGNS AND SYMPTOMS TO REPORT TO CLINIC OR PRIMARY MD: exam and discussion by PA.  We will make a referral to Dr. Darrick Penna regarding your cirrhosis of the liver.  MEDICATIONS PRESCRIBED: none   INSTRUCTIONS GIVEN AND DISCUSSED: Other :  Report unusual bruising or bleeding.  SPECIAL INSTRUCTIONS/FOLLOW-UP: Lab work Needed in 6 months and Return to Clinic in 6 months to see MD.   I acknowledge that I have been informed and understand all the instructions given to me and received a copy. I do not have any more questions at this time, but understand that I may call the Specialty Clinic at [facility] at [phone] during business hours should I have any further questions or need assistance in obtaining follow-up care.    __________________________________________  _____________  __________ Signature of Patient or Authorized Representative            Date                   Time    __________________________________________ Nurse's Signature

## 2012-02-19 NOTE — Progress Notes (Signed)
Melanie Ribas, MD [address] [place] [address]  1. Thrombocytopenia     CURRENT THERAPY: Work-up  INTERVAL HISTORY: Melanie Porter 71 y.o. female returns for  regular  visit for followup of thrombocytopenia.  [name] is here for follow-up after her work-up for thrombocytopenia.  She was initially seen by Dr. Mariel Porter in consultation on 01/29/2012 at which time lab work was performed and CT of abd was ordered.   I personally reviewed and went over laboratory results with the patient.  The patient's CBC was impressive for moderate thrombocytopenia at 88,000.  Otherwise her WBC, RBC, Hgb, MCV, were all WNL.  Her iron studies were unremarkable.  Vitamin B-12 and Folate were adequate.  Her Methylmalonic acid is WNL.  Of note, the patient had a sed rate of 30, positive ANA with a negative titer, non-reactive HIV, and rheumatoid factor was 11.   I personally reviewed and went over radiographic studies with the patient.  The patient's CT abd/pelvis was impressive for cirrhosis of the liver.  The CT report denies splenomegaly, but on images review, in our opinion, she has splenomegaly with a generous spleen size on CT scan (14.5 cm anterior-posteriorly, 8.8 cm laterally, and 9.8 cm cranio-caudally).  The patient reports that 30-40 years ago a doctor told her that something was wrong with her liver before.  She thinks she underwent an EGD at that time.  She was informed that she was to eat raw fruits and vegetables and that solved her issue that she knows of.  She denies ever being jaundiced.  She denies being educated that she had an infection such as hepatitis.   With this information regarding her CT scan, we will refer the patient to GI for further evaluation and management.   I provided the patient education regarding cirrhosis of liver and its correlation with thrombocytopenia.   She complains of B/L LE pain, more in right.  She blames this on blood draws  because that is when it started to hurt (after blood tests).  I explained to the patient that it is not likely from blood tests, but she was not interested in hearing that information.   She plans on going to her PCP today and requesting a "shot in her leg."  I have asked her to give her results to her PCP to be added to their chart.    Past Medical History  Diagnosis Date  . Depression   . Anxiety   . Arthritis   . Obesity   . Hypercholesteremia   . Fibromyalgia   . GERD (gastroesophageal reflux disease)   . Back fracture 05/21/10  . Knee fracture, right 05/21/10  . Ankle fracture, right 05/21/10  . Thrombocytopenia 02/19/2012    has Fibromyalgia; Hyperlipidemia; Arthritis; GERD (gastroesophageal reflux disease); and Thrombocytopenia on her problem list.      has no known allergies.  Melanie Porter had no medications administered during this visit.  Past Surgical History  Procedure Date  . Breast biopsy 1982    left benign  . Total vaginal hysterectomy     Denies any headaches, dizziness, double vision, fevers, chills, night sweats, nausea, vomiting, diarrhea, constipation, chest pain, heart palpitations, shortness of breath, blood in stool, black tarry stool, urinary pain, urinary burning, urinary frequency, hematuria.   PHYSICAL EXAMINATION  ECOG PERFORMANCE STATUS: 2 - Symptomatic, <50% confined to bed  Filed Vitals:   02/19/12 1000  BP: 113/62  Pulse: 78  Temp: 98 F (  36.7 C)  Resp: 24    GENERAL:alert, no distress, comfortable, cooperative and obese SKIN: skin color, texture, turgor are normal, no rashes or significant lesions HEAD: Normocephalic, No masses, lesions, tenderness or abnormalities EYES: normal, Conjunctiva are pink and non-injected EARS: External ears normal OROPHARYNX:mucous membranes are moist  NECK: supple, trachea midline LYMPH:  not examined BREAST:not examined LUNGS: clear to auscultation  HEART: regular rate & rhythm, no murmurs, no gallops,  S1 normal and S2 normal ABDOMEN:abdomen soft, non-tender, obese and normal bowel sounds BACK: Back symmetric, no curvature., No CVA tenderness EXTREMITIES:less then 2 second capillary refill, no joint deformities, effusion, or inflammation, no skin discoloration, no clubbing, no cyanosis, positive findings:  edema left LE 1+ pitting edema pre-tibially.  No erythema or heat.  No tenderness to palpation of calves.  NEURO: alert & oriented x 3 with fluent speech, no focal motor/sensory deficits    LABORATORY DATA: CBC    Component Value Date/Time   WBC 7.0 01/29/2012 1630   RBC 4.00 01/29/2012 1630   HGB 12.9 02/07/2012 0940   HCT 38.0 02/07/2012 0940   PLT 88* 01/29/2012 1630   MCV 98.8 01/29/2012 1630   MCH 32.8 01/29/2012 1630   MCHC 33.2 01/29/2012 1630   RDW 12.9 01/29/2012 1630   LYMPHSABS 2.6 01/29/2012 1630   MONOABS 0.7 01/29/2012 1630   EOSABS 0.3 01/29/2012 1630   BASOSABS 0.0 01/29/2012 1630     RADIOGRAPHIC STUDIES: 02/07/2012  *RADIOLOGY REPORT*  Clinical Data: thrombocytopenia  CT ABDOMEN AND PELVIS WITH CONTRAST  Technique: Multidetector CT imaging of the abdomen and pelvis was  performed following the standard protocol during bolus  administration of intravenous contrast.  Contrast: OMNIPAQUE IOHEXOL 300 MG/ML SOLN  Comparison: None  Findings: Lower thorax: No pericardial or pleural effusion. There  is a azygo-esophageal lymph node which appears enlarged measuring  1.5 cm, image #1. The lung bases are clear.  Upper abdomen: There is hypertrophy of the left hepatic lobe and  caudate lobe of the liver.  Atrophy of the medial segment of left hepatic lobe is noted and  there is a diffuse nodular contour of the liver compatible with  cirrhosis. No focal liver lesions identified.  The gallbladder appears normal. There is no biliary dilatation.  Normal appearance of the pancreas. The spleen is normal measuring  9.8 cm and the length.  The adrenal glands are  both normal. Normal appearance of the right  kidney. The left kidney is normal. Partial collapse of the  urinary bladder which appears thick-walled. Bladder cystocele is  identified. Previous hysterectomy.  Gastrohepatic ligament lymph node appears enlarged measuring 1.2  cm, image number 21. The aortocaval lymph node measures 0.8 cm,  image 35. No enlarged pelvic or inguinal lymph nodes.  Normal appearance of the stomach. The small bowel loops appear  within normal limits. Normal appearance of the colon.  Bones/Musculoskeletal: Review of the visualized bony structures is  significant for multilevel degenerative disc disease within the  lumbar spine. There is a compression fracture involving the T12  vertebra. This is similar to previous exam. No new fractures  identified  IMPRESSION:  1. Morphologic features of the liver compatible with cirrhosis.  2. No splenomegaly.  3. Stable appearance of T12 compression fracture.  4. Enlarged subcarinal and gastrohepatic ligament lymph nodes.  Original Report Authenticated By: Signa Kell, M.D.     ASSESSMENT:  1. Thrombocytopenia 2. Cirrhosis of liver 3. Splenomegaly, in our opinion on CT 4. Obesity 5. 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 I personally reviewed and went over laboratory results with the patient. 2. I personally reviewed and went over radiographic studies with the patient. 3. Referral to GI for cirrhosis of liver and associated thrombocytopenia.  She reports that she has seen Dr. Darrick Penna in the past so we will get [name] in to see Dr. Darrick Penna.  4. Lab work in 6 months: CBC diff 5. Return in 6 months for follow-up.     All questions were answered. The patient knows to call the clinic with any problems, questions or concerns. We can certainly see the patient much sooner if necessary.  The patient and plan discussed  with Glenford Peers, MD and he is in agreement with the aforementioned.  KEFALAS,THOMAS

## 2012-02-19 NOTE — Telephone Encounter (Signed)
Patient notified of GI consult on 02/24/12 @ 9:30 am @ [facility].

## 2012-02-19 NOTE — Telephone Encounter (Signed)
Routing to [place] to schedule.

## 2012-02-19 NOTE — Telephone Encounter (Signed)
Get pt 1st available NPV FOR CIRRHOSIS.

## 2012-02-19 NOTE — Telephone Encounter (Signed)
T/C from Fannie Knee at the [facility]. Pt being referred to Dr. Darrick Penna for cirrhosis and needs to be seen within one month.

## 2012-02-20 ENCOUNTER — Other Ambulatory Visit (HOSPITAL_COMMUNITY): Payer: Self-pay | Admitting: Family Medicine

## 2012-02-20 DIAGNOSIS — R609 Edema, unspecified: Secondary | ICD-10-CM

## 2012-02-21 ENCOUNTER — Ambulatory Visit (HOSPITAL_COMMUNITY)
Admission: RE | Admit: 2012-02-21 | Discharge: 2012-02-21 | Disposition: A | Discharge status: Home / Self Care | Payer: Medicare Other | Source: Ambulatory Visit | Attending: Family Medicine | Admitting: Family Medicine

## 2012-02-21 DIAGNOSIS — M25559 Pain in unspecified hip: Secondary | ICD-10-CM | POA: Insufficient documentation

## 2012-02-21 DIAGNOSIS — R609 Edema, unspecified: Secondary | ICD-10-CM | POA: Insufficient documentation

## 2012-02-21 DIAGNOSIS — E119 Type 2 diabetes mellitus without complications: Secondary | ICD-10-CM | POA: Insufficient documentation

## 2012-02-24 ENCOUNTER — Ambulatory Visit (INDEPENDENT_AMBULATORY_CARE_PROVIDER_SITE_OTHER): Payer: Medicare Other | Admitting: Gastroenterology

## 2012-02-24 ENCOUNTER — Encounter: Payer: Self-pay | Admitting: Gastroenterology

## 2012-02-24 VITALS — BP 120/70 | HR 75 | Temp 98.2°F | Ht 62.0 in | Wt 233.4 lb

## 2012-02-24 DIAGNOSIS — K7460 Unspecified cirrhosis of liver: Secondary | ICD-10-CM

## 2012-02-24 LAB — HEPATIC FUNCTION PANEL
ALT: 30 U/L (ref 0–35)
AST: 31 U/L (ref 0–37)
Bilirubin, Direct: 0.1 mg/dL (ref 0.0–0.3)
Indirect Bilirubin: 0.5 mg/dL (ref 0.0–0.9)
Total Bilirubin: 0.6 mg/dL (ref 0.3–1.2)

## 2012-02-24 LAB — PROTIME-INR: Prothrombin Time: 14 seconds (ref 11.6–15.2)

## 2012-02-24 LAB — BASIC METABOLIC PANEL
Chloride: 105 mEq/L (ref 96–112)
Creat: 0.71 mg/dL (ref 0.50–1.10)
Potassium: 3.6 mEq/L (ref 3.5–5.3)

## 2012-02-24 NOTE — Patient Instructions (Addendum)
We have set you up for an upper endoscopy with Dr. Darrick Penna in the near future.  We have also ordered bloodwork; please have this completed, and we will call with the results.  Please review the handout on cirrhosis. We will see you back in 6 months or sooner if needed.       Cirrhosis Cirrhosis is a condition of scarring of the liver which is caused when the liver has tried repairing itself following damage. This damage may come from a previous infection such as one of the forms of hepatitis (usually hepatitis C), or the damage may come from being injured by toxins. The main toxin that causes this damage is alcohol. The scarring of the liver from use of alcohol is irreversible. That means the liver cannot return to normal even though alcohol is not used any more. The main danger of hepatitis C infection is that it may cause long-lasting (chronic) liver disease, and this also may lead to cirrhosis. This complication is progressive and irreversible. CAUSES   Prior to available blood tests, hepatitis C could be contracted by blood transfusions. Since testing of blood has improved, this is now unlikely. This infection can also be contracted through intravenous drug use and the sharing of needles. It can also be contracted through sexual relationships. The injury caused by alcohol comes from too much use. It is not a few drinks that poison the liver, but years of misuse. Usually there will be some signs and symptoms early with scarring of the liver that suggest the development of better habits. Alcohol should never be used while using acetaminophen. A small dose of both taken together may cause irreversible damage to the liver. HOME CARE INSTRUCTIONS   There is no specific treatment for cirrhosis. However, there are things you can do to avoid making the condition worse.  Rest as needed.   Eat a well-balanced diet. Your caregiver can help you with suggestions.   Vitamin supplements including vitamins  A, K, D, and thiamine can help.   A low-salt diet, water restriction, or diuretic medicine may be needed to reduce fluid retention.   Avoid alcohol. This can be extremely toxic if combined with acetaminophen.   Avoid drugs which are toxic to the liver. Some of these include isoniazid, methyldopa, acetaminophen, anabolic steroids (muscle-building drugs), erythromycin, and oral contraceptives (birth control pills). Check with your caregiver to make sure medicines you are presently taking will not be harmful.   Periodic blood tests may be required. Follow your caregiver's advice regarding the timing of these.   Milk thistle is an herbal remedy which does protect the liver against toxins. However, it will not help once the liver has been scarred.  SEEK MEDICAL CARE IF:  You have increasing fatigue or weakness.   You develop swelling of the hands, feet, legs, or face.   You vomit bright red blood, or a coffee ground appearing material.   You have blood in your stools, or the stools turn black and tarry.   You have a fever.   You develop loss of appetite, or have nausea and vomiting.   You develop jaundice.   You develop easy bruising or bleeding.   You have worsening of any of the problems you are concerned about.  Document Released: 03/18/2005 Document Revised: 06/10/2011 Document Reviewed: 11/04/2007 [facility] Patient Information 2013 [place], [place].

## 2012-02-24 NOTE — Progress Notes (Signed)
Primary Care Physician:  Colette Ribas, MD Primary Gastroenterologist:  Dr. Darrick Penna   Chief Complaint  Patient presents with  . Cirrhosis    HPI:   71 year old female referred to our practice by Jenita Seashore, PA/Dr. Mariel Sleet due to new finding of cirrhosis. She had been seeing Hematology due to thrombocytopenia. CT ordered noted cirrhosis. She notes she has been told she had a fatty liver in the remote past. Denies jaundice, pruritis, memory loss, confusion, lack of appetite. +reflux, on Omeprazole daily. Notes difficulty initiating swallowing. No food dysphagia. Difficulty with large pills.  Notes chronic constipation. Takes MOM. No rectal bleeding. Reportedly EGD in remote past, but we do not have records. Requesting. Notes last colonoscopy in 2010; we are requesting this as well.   No prior blood transfusion. No IV drug use, no tattoos. Only 1 sexual partner.    CT Nov 2013:  IMPRESSION:  1. Morphologic features of the liver compatible with cirrhosis.  2. No splenomegaly.  3. Stable appearance of T12 compression fracture.  4. Enlarged subcarinal and gastrohepatic ligament lymph nodes   Past Medical History  Diagnosis Date  . Depression   . Anxiety   . Arthritis   . Obesity   . Hypercholesteremia   . Fibromyalgia   . GERD (gastroesophageal reflux disease)   . Back fracture 05/21/10  . Knee fracture, right 05/21/10  . Ankle fracture, right 05/21/10  . Thrombocytopenia 02/19/2012    Past Surgical History  Procedure Date  . Breast biopsy 1982    left benign  . Partial hysterectomy     Current Outpatient Prescriptions  Medication Sig Dispense Refill  . aspirin 81 MG tablet Take 81 mg by mouth daily.        . bisacodyl (DULCOLAX) 5 MG EC tablet Take 10 mg by mouth daily as needed. Constipation      . Calcium Carbonate-Vit D-Min (CALTRATE PLUS PO) Take 1 tablet by mouth daily.       . fish oil-omega-3 fatty acids 1000 MG capsule Take 1 g by mouth 2 (two) times  daily.       . Flaxseed, Linseed, (FLAXSEED OIL) 1000 MG CAPS Take 1,000 mg by mouth 2 (two) times daily.       Marland Kitchen FLUoxetine (PROZAC) 40 MG capsule Take 40 mg by mouth daily.        Marland Kitchen HYDROcodone-acetaminophen (NORCO/VICODIN) 5-325 MG per tablet Take 1 tablet by mouth every 6 (six) hours as needed. Pain      . Multiple Vitamin (MULTIVITAMIN) tablet Take 1 tablet by mouth daily.        Marland Kitchen omeprazole (PRILOSEC) 20 MG capsule Take 20 mg by mouth daily.        Marland Kitchen Propylene Glycol (SYSTANE BALANCE) 0.6 % SOLN Apply 1 drop to eye daily as needed. Dry Eyes      . simvastatin (ZOCOR) 40 MG tablet Take 40 mg by mouth every evening.      . traZODone (DESYREL) 100 MG tablet Take 100 mg by mouth at bedtime.      . vitamin B-12 (CYANOCOBALAMIN) 250 MCG tablet Take 250 mcg by mouth daily.        Allergies as of 02/24/2012  . (No Known Allergies)    Family History  Problem Relation Age of Onset  . Heart disease Father   . Diabetes Sister   . Hypertension Sister   . Heart disease Sister   . Hypertension Brother   . Heart disease Brother   .  Colon cancer Neg Hx     History   Social History  . Marital Status: Married    Spouse Name: N/A    Number of Children: N/A  . Years of Education: N/A   Occupational History  . 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 Smoking status: Never Smoker   . Smokeless tobacco: Never Used  . Alcohol Use: No  . Drug Use: No  . Sexually Active: No   Other Topics Concern  . Not on file   Social History Narrative  . No narrative on file    Review of Systems: Gen: Denies any fever, chills. + fatigue.  CV: +palpitations Resp: Denies shortness of breath at rest or with exertion. Denies wheezing or cough.  GI: see HPI GU : +urinary incontinence MS: +joint pain "all over" Derm: Denies rash, itching, dry skin Psych: age-related memory changes Heme: Denies bruising, bleeding, and enlarged lymph nodes.  Physical  Exam: BP 120/70  Pulse 75  Temp 98.2 F (36.8 C) (Temporal)  Ht 5\' 2"  (1.575 m)  Wt 233 lb 6.4 oz (105.87 kg)  BMI 42.69 kg/m2 General:   Alert and oriented. Pleasant and cooperative. Well-nourished and well-developed.  Head:  Normocephalic and atraumatic. Eyes:  Without icterus, sclera clear and conjunctiva pink.  Ears:  Normal auditory acuity. Nose:  No deformity, discharge,  or lesions. Mouth:  No deformity or lesions, oral mucosa pink.  Neck:  Supple, without mass or thyromegaly. Lungs:  Clear to auscultation bilaterally. No wheezes, rales, or rhonchi. No distress.  Heart:  S1, S2 present without murmurs appreciated.  Abdomen:  +BS, soft, non-tender and non-distended. Large AP diameter; difficult to appreciate definite HSM.  No guarding or rebound. No masses appreciated.  Rectal:  Deferred  Msk:  Symmetrical without gross deformities. Lower extremity trace edema. Left lower extremity negative for DVT via [place] recently. Extremities:  Without clubbing or edema. Neurologic:  Alert and  oriented x4;  grossly normal neurologically. Skin:  Intact without significant lesions or rashes. Cervical Nodes:  No significant cervical adenopathy. Psych:  Alert and cooperative. Normal mood and affect.

## 2012-02-25 ENCOUNTER — Encounter (HOSPITAL_COMMUNITY): Payer: Self-pay | Admitting: Pharmacy Technician

## 2012-02-25 DIAGNOSIS — K7469 Other cirrhosis of liver: Secondary | ICD-10-CM | POA: Insufficient documentation

## 2012-02-25 LAB — HEPATITIS B SURFACE ANTIGEN: Hepatitis B Surface Ag: NEGATIVE

## 2012-02-25 LAB — IGG, IGA, IGM: IgA: 541 mg/dL — ABNORMAL HIGH (ref 69–380)

## 2012-02-25 NOTE — Assessment & Plan Note (Signed)
71 year old female with recent diagnosis of cirrhosis, hx of fatty liver per pt. Appears well-compensated at this time. Needs EGD for esophageal varices screening; also notes difficulty initiating swallowing at times, but she denies solid food or pill dysphagia. Doubt need for dilation. +reflux but takes Omeprazole daily.   Likely cirrhosis secondary to fatty liver. Will draw further labs, including AFP, for completeness. Next [place] and AFP in 6 mos.  Cirrhosis handout provided.  Proceed with upper endoscopy in the near future with Dr. Darrick Penna. The risks, benefits, and alternatives have been discussed in detail with patient. They have stated understanding and desire to proceed.  As of note, last TCS recently per pt (2010?) retrieve notes for our records.

## 2012-02-26 ENCOUNTER — Other Ambulatory Visit: Payer: Self-pay | Admitting: Gynecology

## 2012-02-26 ENCOUNTER — Telehealth: Payer: Self-pay | Admitting: *Deleted

## 2012-02-26 LAB — ANTI-SMOOTH MUSCLE ANTIBODY, IGG: Smooth Muscle Ab: 7 U (ref <20)

## 2012-02-26 NOTE — Telephone Encounter (Signed)
Please tell her that to save her money that we need to call her in at the compound pharmacy on [address] the following:  Estradiol 0.02% 1 ML prefilled Applicator to apply vaginally twice a week. # 3 months supply 4 refills

## 2012-02-26 NOTE — Telephone Encounter (Signed)
Pt was seen on 02/04/12 c/o vaginal dryness, she spoke with you about a Rx, but pharmacy never received Rx. Pt said it was a cream. Please advise

## 2012-02-26 NOTE — Progress Notes (Signed)
Faxed to PCP

## 2012-03-02 ENCOUNTER — Ambulatory Visit (HOSPITAL_COMMUNITY)
Admission: RE | Admit: 2012-03-02 | Discharge: 2012-03-02 | Disposition: A | Discharge status: Home / Self Care | Payer: Medicare Other | Source: Ambulatory Visit | Attending: Gastroenterology | Admitting: Gastroenterology

## 2012-03-02 ENCOUNTER — Encounter (HOSPITAL_COMMUNITY): Payer: Self-pay | Admitting: *Deleted

## 2012-03-02 ENCOUNTER — Encounter (HOSPITAL_COMMUNITY)
Admission: RE | Disposition: A | Discharge status: Home / Self Care | Payer: Self-pay | Source: Ambulatory Visit | Attending: Gastroenterology

## 2012-03-02 DIAGNOSIS — K259 Gastric ulcer, unspecified as acute or chronic, without hemorrhage or perforation: Secondary | ICD-10-CM | POA: Insufficient documentation

## 2012-03-02 DIAGNOSIS — K7460 Unspecified cirrhosis of liver: Secondary | ICD-10-CM

## 2012-03-02 DIAGNOSIS — K219 Gastro-esophageal reflux disease without esophagitis: Secondary | ICD-10-CM | POA: Insufficient documentation

## 2012-03-02 HISTORY — PX: ESOPHAGOGASTRODUODENOSCOPY: SHX5428

## 2012-03-02 SURGERY — EGD (ESOPHAGOGASTRODUODENOSCOPY)
Anesthesia: Moderate Sedation

## 2012-03-02 MED ORDER — SODIUM CHLORIDE 0.45 % IV SOLN
INTRAVENOUS | Status: DC
  Administered 2012-03-02: 1000 mL via INTRAVENOUS

## 2012-03-02 MED ORDER — STERILE WATER FOR IRRIGATION IR SOLN
Status: DC | PRN
  Administered 2012-03-02: 13:00:00

## 2012-03-02 MED ORDER — MIDAZOLAM HCL 5 MG/5ML IJ SOLN
INTRAMUSCULAR | Status: DC | PRN
  Administered 2012-03-02: 1 mg via INTRAVENOUS
  Administered 2012-03-02: 2 mg via INTRAVENOUS

## 2012-03-02 MED ORDER — MEPERIDINE HCL 100 MG/ML IJ SOLN
INTRAMUSCULAR | Status: DC | PRN
  Administered 2012-03-02: 25 mg via INTRAVENOUS

## 2012-03-02 MED ORDER — MEPERIDINE HCL 100 MG/ML IJ SOLN
INTRAMUSCULAR | Status: AC
  Filled 2012-03-02: qty 2

## 2012-03-02 MED ORDER — BUTAMBEN-TETRACAINE-BENZOCAINE 2-2-14 % EX AERO
INHALATION_SPRAY | CUTANEOUS | Status: DC | PRN
  Administered 2012-03-02: 1 via TOPICAL

## 2012-03-02 MED ORDER — MIDAZOLAM HCL 5 MG/5ML IJ SOLN
INTRAMUSCULAR | Status: AC
  Filled 2012-03-02: qty 10

## 2012-03-02 NOTE — H&P (Signed)
Primary Care Physician:  Colette Ribas, MD Primary Gastroenterologist:  Dr. Darrick Penna  Pre-Procedure History & Physical: HPI:  Melanie Porter is a 71 y.o. female here for SCREENING FOR VARICES.  Past Medical History  Diagnosis Date  . Depression   . Anxiety   . Arthritis   . Obesity   . Hypercholesteremia   . Fibromyalgia   . GERD (gastroesophageal reflux disease)   . Back fracture 05/21/10  . Knee fracture, right 05/21/10  . Ankle fracture, right 05/21/10  . Thrombocytopenia 02/19/2012    Past Surgical History  Procedure Date  . Breast biopsy 1982    left benign  . Partial hysterectomy     Prior to Admission medications   Medication Sig Start Date End Date Taking? Authorizing Provider  aspirin 81 MG tablet Take 81 mg by mouth daily.     Yes Historical Provider, MD  bisacodyl (DULCOLAX) 5 MG EC tablet Take 10 mg by mouth daily as needed. Constipation   Yes Historical Provider, MD  Calcium Carbonate-Vit D-Min (CALTRATE PLUS PO) Take 1 tablet by mouth daily.    Yes Historical Provider, MD  fish oil-omega-3 fatty acids 1000 MG capsule Take 1 g by mouth 2 (two) times daily.    Yes Historical Provider, MD  Flaxseed, Linseed, (FLAXSEED OIL) 1000 MG CAPS Take 1,000 mg by mouth 2 (two) times daily.    Yes Historical Provider, MD  FLUoxetine (PROZAC) 40 MG capsule Take 40 mg by mouth daily.     Yes Historical Provider, MD  HYDROcodone-acetaminophen (NORCO/VICODIN) 5-325 MG per tablet Take 1 tablet by mouth every 6 (six) hours as needed. Pain   Yes Historical Provider, MD  Multiple Vitamin (MULTIVITAMIN) tablet Take 1 tablet by mouth daily.     Yes Historical Provider, MD  omeprazole (PRILOSEC) 20 MG capsule Take 20 mg by mouth daily.     Yes Historical Provider, MD  Propylene Glycol (SYSTANE BALANCE) 0.6 % SOLN Apply 1 drop to eye daily as needed. Dry Eyes   Yes Historical Provider, MD  simvastatin (ZOCOR) 40 MG tablet Take 40 mg by mouth every evening.   Yes Historical Provider, MD    traZODone (DESYREL) 100 MG tablet Take 100 mg by mouth at bedtime.   Yes Historical Provider, MD  vitamin B-12 (CYANOCOBALAMIN) 250 MCG tablet Take 250 mcg by mouth daily.   Yes Historical Provider, MD    Allergies as of 02/24/2012  . (No Known Allergies)    Family History  Problem Relation Age of Onset  . Heart disease Father   . Diabetes Sister   . Hypertension Sister   . Heart disease Sister   . Hypertension Brother   . Heart disease Brother   . Colon cancer Neg Hx     History   Social History  . Marital Status: Married    Spouse Name: N/A    Number of Children: N/A  . Years of Education: N/A   Occupational History  . 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 Smoking status: Never Smoker   . Smokeless tobacco: Never Used  . Alcohol Use: No  . Drug Use: No  . Sexually Active: No   Other Topics Concern  . Not on file   Social History Narrative  . No narrative on file    Review of Systems: See HPI, otherwise negative ROS   Physical Exam: BP 136/65  Pulse 55  Temp 97.8  F (36.6 C) (Oral)  Resp 21  SpO2 96% General:   Alert,  pleasant and cooperative in NAD Head:  Normocephalic and atraumatic. Neck:  Supple; Lungs:  Clear throughout to auscultation.    Heart:  Regular rate and rhythm. Abdomen:  Soft, nontender and nondistended. Normal bowel sounds, without guarding, and without rebound.   Neurologic:  Alert and  oriented x4;  grossly normal neurologically.  Impression/Plan:     SCREENING VARICES  PLAN:  1.EGD TODAY

## 2012-03-02 NOTE — Op Note (Signed)
[facility] [address] [place], [address]   ENDOSCOPY PROCEDURE REPORT  PATIENT: Melanie Porter, Melanie Porter  MR#: [id number] BIRTHDATE: 06-16-1940 , 71  yrs. old GENDER: Female  ENDOSCOPIST: Jonette Eva, MD REFERRED VH:QION Phillips Odor, M.D.  Glenford Peers, M.D.  PROCEDURE DATE: 03/02/2012 PROCEDURE:   EGD w/ biopsy  INDICATIONS:Screening for varices. PROBABLE NASH CIRRHOSIS-BMI > 40. CHILD PUGH A(5) & MELD SCORE 7  MEDICATIONS: Demerol 25 mg IV and Versed 3 mg IV TOPICAL ANESTHETIC:   Cetacaine Spray  DESCRIPTION OF PROCEDURE:     Physical exam was performed.  Informed consent was obtained from the patient after explaining the benefits, risks, and alternatives to the procedure.  The patient was connected to the monitor and placed in the left lateral position.  Continuous oxygen was provided by nasal cannula and IV medicine administered through an indwelling cannula.  After administration of sedation, the patients esophagus was intubated and the EG-[id number] ([id number])  endoscope was advanced under direct visualization to the second portion of the duodenum.  The scope was removed slowly by carefully examining the color, texture, anatomy, and integrity of the mucosa on the way out.  The patient was recovered in endoscopy and discharged home in satisfactory condition.      ESOPHAGUS: The mucosa of the esophagus appeared normal.  STOMACH: Multiple non-bleeding linear, irregular shaped and clean-based ulcers, ranging between 3-43mm in size, with surrounding edema and heaped up edges were found in the gastric antrum. Biopsies were taken around the ulcers.  DUODENUM: The duodenal mucosa showed no abnormalities in the bulb and second portion of the duodenum. COMPLICATIONS:   None  ENDOSCOPIC IMPRESSION: 1.   The mucosa of the esophagus appeared normal 2.   Multiple non-bleeding ulcers, ranging between 3-15mm in size, were found in the gastric antrum 3.   The duodenal  mucosa showed no abnormalities in the bulb and second portion of the duodenum  RECOMMENDATIONS: HOLD ASPIRIN.  RESTART JAN 2.  TAKE OMPERAZOLE twice daily FOR 3 MOS THEN ONCE DAILY.  FOLLOW A LOW FAT DIET.  BIOPSIES SHOULD BE BACK IN 7 DAYS.  FOLLOW UP IN 6 MOS.  CONSIDER REPEAT EGD IN 3 MO S TO ASSESS HEALING. REPEAT EGD IN 3 YEARS TO SCREEN FOR VARICES.   REPEAT EXAM:   _______________________________ Rosalie DoctorJonette Eva, MD 03/02/2012 3:12 PM       PATIENT NAME:  Melanie Porter, Melanie Porter MR#: [id number]

## 2012-03-03 MED ORDER — NONFORMULARY OR COMPOUNDED ITEM: Status: DC

## 2012-03-03 NOTE — Telephone Encounter (Signed)
Pt informed with the below note, rx called  

## 2012-03-04 ENCOUNTER — Telehealth: Payer: Self-pay | Admitting: Gastroenterology

## 2012-03-04 ENCOUNTER — Encounter (HOSPITAL_COMMUNITY): Payer: Self-pay | Admitting: Gastroenterology

## 2012-03-04 NOTE — Telephone Encounter (Signed)
Please call pt. HER stomach Bx shows ULCERS DUE TO ASA USE.    HOLD ASPIRIN. RESTART JAN 2.  TAKE OMPERAZOLE twice daily FOR 3 MOS THEN ONCE DAILY.  FOLLOW A LOW FAT DIET.   REPEAT EGD IN 3 MOS TO CONFIRM ULCERS ARE HEALED.  FOLLOW UP IN 6 MOS E30 CIRRHOSIS. YOU SHOULD BE SEEN TWICE A YEAR.  REPEAT EGD IN 3 YEARS.

## 2012-03-05 NOTE — Telephone Encounter (Signed)
Path faxed to PCP, recalls made 

## 2012-03-05 NOTE — Telephone Encounter (Signed)
Called, home line busy. Mobile not accepting calls at this time.

## 2012-03-05 NOTE — Telephone Encounter (Signed)
I nicd in the computer to repeat EGD in 3 months

## 2012-03-06 NOTE — Telephone Encounter (Signed)
Mailed letter for pt to call.

## 2012-03-10 ENCOUNTER — Telehealth: Payer: Self-pay

## 2012-03-10 NOTE — Telephone Encounter (Signed)
Pt called for her results from the EGD. She was informed of it all.

## 2012-03-12 ENCOUNTER — Other Ambulatory Visit: Payer: Self-pay | Admitting: Anesthesiology

## 2012-03-12 DIAGNOSIS — Z1211 Encounter for screening for malignant neoplasm of colon: Secondary | ICD-10-CM

## 2012-03-16 ENCOUNTER — Other Ambulatory Visit: Payer: Self-pay | Admitting: Gynecology

## 2012-03-16 DIAGNOSIS — Z1211 Encounter for screening for malignant neoplasm of colon: Secondary | ICD-10-CM

## 2012-04-02 NOTE — Progress Notes (Signed)
Quick Note:  MELD 7.  Make sure pt has appt for 6 months ______

## 2012-04-02 NOTE — Progress Notes (Signed)
Quick Note:  Pt chart has been nic'd for 6 month follow up. ______

## 2012-05-28 ENCOUNTER — Encounter: Payer: Self-pay | Admitting: Gastroenterology

## 2012-05-28 NOTE — Progress Notes (Signed)
REVIEWED.  SCHEDULE EGD IN MAR 2014, DX:PUD

## 2012-06-18 ENCOUNTER — Ambulatory Visit (INDEPENDENT_AMBULATORY_CARE_PROVIDER_SITE_OTHER): Payer: Medicare Other

## 2012-06-18 DIAGNOSIS — Z780 Asymptomatic menopausal state: Secondary | ICD-10-CM

## 2012-07-01 ENCOUNTER — Encounter: Payer: Self-pay | Admitting: Gynecology

## 2012-07-02 ENCOUNTER — Other Ambulatory Visit: Payer: Self-pay | Admitting: *Deleted

## 2012-07-03 ENCOUNTER — Other Ambulatory Visit: Payer: Self-pay | Admitting: *Deleted

## 2012-07-03 DIAGNOSIS — M8580 Other specified disorders of bone density and structure, unspecified site: Secondary | ICD-10-CM

## 2012-07-07 ENCOUNTER — Other Ambulatory Visit: Payer: Medicare Other

## 2012-07-07 ENCOUNTER — Encounter: Payer: Self-pay | Admitting: Gastroenterology

## 2012-07-14 ENCOUNTER — Encounter: Payer: Self-pay | Admitting: Gynecology

## 2012-07-28 ENCOUNTER — Other Ambulatory Visit: Payer: Self-pay | Admitting: Radiology

## 2012-07-28 ENCOUNTER — Encounter: Payer: Self-pay | Admitting: Gynecology

## 2012-07-30 ENCOUNTER — Telehealth: Payer: Self-pay | Admitting: *Deleted

## 2012-07-30 NOTE — Telephone Encounter (Signed)
Spoke with patient regarding letter from [facility] for her follow -up breast bx. Pt said that she had bx done and will contact [facility] to make sure everything is done.

## 2012-08-18 ENCOUNTER — Other Ambulatory Visit (HOSPITAL_COMMUNITY): Payer: Medicare Other

## 2012-08-19 ENCOUNTER — Encounter (HOSPITAL_COMMUNITY): Payer: Medicare Other | Attending: Hematology and Oncology

## 2012-08-19 ENCOUNTER — Encounter (HOSPITAL_COMMUNITY): Payer: Self-pay

## 2012-08-19 VITALS — BP 102/63 | HR 71 | Temp 98.5°F | Resp 18 | Wt 236.8 lb

## 2012-08-19 DIAGNOSIS — D696 Thrombocytopenia, unspecified: Secondary | ICD-10-CM | POA: Insufficient documentation

## 2012-08-19 DIAGNOSIS — R599 Enlarged lymph nodes, unspecified: Secondary | ICD-10-CM

## 2012-08-19 LAB — COMPREHENSIVE METABOLIC PANEL
ALT: 21 U/L (ref 0–35)
BUN: 11 mg/dL (ref 6–23)
CO2: 28 mEq/L (ref 19–32)
Calcium: 9.2 mg/dL (ref 8.4–10.5)
GFR calc Af Amer: >90 mL/min (ref >90)
GFR calc non Af Amer: 88 mL/min — ABNORMAL LOW (ref >90)
Glucose, Bld: 103 mg/dL — ABNORMAL HIGH (ref 70–99)
Sodium: 138 mEq/L (ref 135–145)

## 2012-08-19 LAB — CBC WITH DIFFERENTIAL/PLATELET
Basophils Relative: 0 % (ref 0–1)
Eosinophils Absolute: 0.2 10*3/uL (ref 0.0–0.7)
Eosinophils Relative: 4 % (ref 0–5)
HCT: 37.9 % (ref 36.0–46.0)
Hemoglobin: 12.6 g/dL (ref 12.0–15.0)
MCH: 32.6 pg (ref 26.0–34.0)
MCHC: 33.2 g/dL (ref 30.0–36.0)
MCV: 98.2 fL (ref 78.0–100.0)
Monocytes Absolute: 0.6 10*3/uL (ref 0.1–1.0)
Monocytes Relative: 11 % (ref 3–12)
Neutro Abs: 3.1 10*3/uL (ref 1.7–7.7)

## 2012-08-19 LAB — IGG, IGA, IGM: IgG (Immunoglobin G), Serum: 1350 mg/dL (ref 690–1700)

## 2012-08-19 NOTE — Progress Notes (Signed)
[facility] [phone]   [phone]  OFFICE PROGRESS NOTE  Melanie Ribas, MD [address] [place] [address]  DIAGNOSIS: 1.Thrombocytopenia                        2. Lymphadenopathy   INTERVAL HISTORY: Melanie Porter 72 y.o. female returns to the clinic today for follow up of thrombocytopenia. Patient brought with her  CBC and CMP results from her primary physician's office visit. This is date 07/06/2012.The result was essentially unremarkable except for its current 57,000. Hemoglobin was normal at 13 and white count 5.7. I reviewed Radiologic imaging results 02/07/2012 she had features thought to be compatible with  Liver cirrhosis.enlarged subcarinal and gastrohepatic ligament lymph nodes we also mentioned this is ranged from 1.2 cm to 0.8 cm. Also  CT angiogram 06/10/10 showed  Some enlargement of mediastinal  lymph nodes measuring up measuring up to 1.1 cm.  She reports painful right wrist which she does she had sprained last weekend. She says that this is getting better. She reports some mild shortness of breath and activity but this has not changed from baseline. She denies any unintentional weight loss. She reports night sweats but states that this does not happen every night. She admits to easy bruising but states that this has not changed. This involves upper extremities mostly. She denies history of excessive abnormal bleeding.    MEDICAL HISTORY: Past Medical History  Diagnosis Date  . Depression   . Anxiety   . Arthritis   . Obesity   . Hypercholesteremia   . Fibromyalgia   . GERD (gastroesophageal reflux disease)   . Back fracture 05/21/10  . Knee fracture, right 05/21/10  . Ankle fracture, right 05/21/10  . Thrombocytopenia 02/19/2012  . Nonalcoholic steatohepatitis (NASH) BMI > 40    NOV 2013 CHILD PUGH A, MELD SCORE 7    ALLERGIES:  has no allergies on file.  MEDICATIONS:  Current  Outpatient Prescriptions  Medication Sig Dispense Refill  . Calcium Carbonate-Vit D-Min (CALTRATE PLUS PO) Take 1 tablet by mouth daily.       . Cyanocobalamin (VITAMIN B 12 PO) Take 2,000 mcg by mouth daily.      Marland Kitchen docusate sodium (COLACE) 100 MG capsule Take 100 mg by mouth at bedtime. Takes 2 at bedtime      . Flaxseed, Linseed, (FLAXSEED OIL) 1000 MG CAPS Take 1,000 mg by mouth 2 (two) times daily.       Marland Kitchen FLUoxetine (PROZAC) 40 MG capsule Take 40 mg by mouth daily.        Marland Kitchen HYDROcodone-acetaminophen (NORCO/VICODIN) 5-325 MG per tablet Take 1 tablet by mouth every 6 (six) hours as needed. Pain      . loratadine (CLARITIN) 10 MG tablet Take 10 mg by mouth as needed for allergies.      . Multiple Vitamins-Minerals (MULTIVITAMIN GUMMIES ADULT PO) Take 2 each by mouth daily. Vitafusion Women's complete multivitamin gummy      . omeprazole (PRILOSEC) 20 MG capsule Take 20 mg by mouth daily.        Marland Kitchen Propylene Glycol (SYSTANE BALANCE) 0.6 % SOLN Apply 1 drop to eye daily as needed. Dry Eyes      . simvastatin (ZOCOR) 40 MG tablet Take 40 mg by mouth every evening.      . traZODone (DESYREL) 100 MG tablet Take 100 mg by mouth at  bedtime. Takes 1/2 tablet at bedtime.      . bisacodyl (DULCOLAX) 5 MG EC tablet Take 10 mg by mouth daily as needed. Constipation       No current facility-administered medications for this visit.    SURGICAL HISTORY:  Past Surgical History  Procedure Laterality Date  . Breast biopsy  1982    left benign  . Partial hysterectomy    . Esophagogastroduodenoscopy  03/02/2012    Procedure: ESOPHAGOGASTRODUODENOSCOPY (EGD);  Surgeon: West Bali, MD;  Location: [facility];  Service: Endoscopy;  Laterality: N/A;  1:30    REVIEW OF SYSTEMS:  14 point review of system is as in the history above otherwise negative.  PHYSICAL EXAMINATION:    Blood pressure 102/63, pulse 71, temperature 98.5 F (36.9 C), temperature source Oral, resp. rate 18, weight 236 lb 12.8 oz  (107.412 kg).  GENERAL: No distress, Severely obese.  SKIN:  No rashes or significant lesions  HEAD: Normocephalic, No masses, lesions, tenderness or abnormalities  EYES: Conjunctiva are pink and non-injected  ENT: External ears normal ,lips, buccal mucosa, and tongue normal and mucous membranes are moist  LYMPH: No palpable lymphadenopathy in the neck , supraclavicular ,axilla or inguinal areas.  BREAST:not examined.  LUNGS: clear to auscultation , no crackles or wheezes HEART: regular rate & rhythm, no murmurs, no gallops, S1 normal and S2 normal  ABDOMEN: Abdomen soft, non-tender, normal bowel sounds, difficult to palpate for organomegaly due obesity. EXTREMITIES: 1+ leg edema, no skin discoloration or tenderness NEURO: alert & oriented , no focal motor/sensory deficits.    LABORATORY DATA: Lab Results  Component Value Date   WBC 7.0 01/29/2012   HGB 12.9 02/07/2012   HCT 38.0 02/07/2012   MCV 98.8 01/29/2012   PLT 88* 01/29/2012      Chemistry      Component Value Date/Time   NA 137 02/24/2012 1028   K 3.6 02/24/2012 1028   CL 105 02/24/2012 1028   CO2 26 02/24/2012 1028   BUN 18 02/24/2012 1028   CREATININE 0.71 02/24/2012 1028   CREATININE 0.90 02/07/2012 0940      Component Value Date/Time   CALCIUM 9.7 02/24/2012 1028   ALKPHOS 108 02/24/2012 1028   AST 31 02/24/2012 1028   ALT 30 02/24/2012 1028   BILITOT 0.6 02/24/2012 1028       RADIOGRAPHIC STUDIES: No results found.   ASSESSMENT: [name] has thrombocytopenia ,abdomen and thoracic lymphadenopathy. This raises the suspicion of  Lymphoma as possible etiology for decreased platelet count. This is evidenced in the CTs results referenced in the history above.elevated IgG and IgA balance serum protein electrophoresis and light chain assay past biclonal gammopathy is a recognized entity.   PLAN:  1. Have a repeat CT of the chest abdomen and pelvis to re evaluate the interval status of the lymph nodes in the  chest and abdomen. 2. In due course patient may need a bone marrow aspiration and biopsy to further work this before the diagnosis of ITP can be made. 3. Have scheduled a return visit in 4 weeks to discuss results of the CT. 4. I also ordered serum protein electrophoresis and light chain assay further evaluate the increase in IgG and IgA to rule out the possibility of monoclonal gammopathy.  5. I ordered a repeat CBC given that the platelet count done on 07/06/2012 was lower than her baseline. I want to make sure that this is not trending downwards.    All questions  were answered. The patient knows to call the clinic with any problems, questions or concerns. We can certainly see the patient much sooner if necessary.  I spent 15 minutes counseling the patient face to face. The total time spent in the appointment was 30 minutes.   Sherral Hammers, MD FACP. Hematology/Oncology.

## 2012-08-19 NOTE — Patient Instructions (Addendum)
[facility] Discharge Instructions  RECOMMENDATIONS MADE BY THE CONSULTANT AND ANY TEST RESULTS WILL BE SENT TO YOUR REFERRING PHYSICIAN.  EXAM FINDINGS BY THE PHYSICIAN TODAY AND SIGNS OR SYMPTOMS TO REPORT TO CLINIC OR PRIMARY PHYSICIAN: Exam and discussion by Dr. Sharia Reeve.  Your platelet count has dropped some and we need to repeat your blood work.  We also need to recheck some xrays to make sure nothing has changed.  MEDICATIONS PRESCRIBED:  none  INSTRUCTIONS GIVEN AND DISCUSSED: Report increased night sweats, recurring infections, unusual and unexplained bruising or bleeding.  SPECIAL INSTRUCTIONS/FOLLOW-UP: Blood work today, CT scans of your chest, abdomen and pelvis and follow-up in 1 month.  Thank you for choosing [facility] to provide your oncology and hematology care.  To afford each patient quality time with our providers, please arrive at least 15 minutes before your scheduled appointment time.  With your help, our goal is to use those 15 minutes to complete the necessary work-up to ensure our physicians have the information they need to help with your evaluation and healthcare recommendations.    Effective January 1st, 2014, we ask that you re-schedule your appointment with our physicians should you arrive 10 or more minutes late for your appointment.  We strive to give you quality time with our providers, and arriving late affects you and other patients whose appointments are after yours.    Again, thank you for choosing [facility].  Our hope is that these requests will decrease the amount of time that you wait before being seen by our physicians.       _____________________________________________________________  Should you have questions after your visit to [facility], please contact our office at [phone] between the hours of 8:30 a.m. and 5:00 p.m.  Voicemails left after 4:30 p.m. will not be returned  until the following business day.  For prescription refill requests, have your pharmacy contact our office with your prescription refill request.

## 2012-08-20 LAB — KAPPA/LAMBDA LIGHT CHAINS: Kappa free light chain: 2.59 mg/dL — ABNORMAL HIGH (ref 0.33–1.94)

## 2012-08-21 ENCOUNTER — Encounter: Payer: Self-pay | Admitting: Gastroenterology

## 2012-08-21 LAB — MULTIPLE MYELOMA PANEL, SERUM
Albumin ELP: 48.2 % — ABNORMAL LOW (ref 55.8–66.1)
Beta 2: 6.5 % (ref 3.2–6.5)
Beta Globulin: 5.4 % (ref 4.7–7.2)
IgM, Serum: 250 mg/dL (ref 52–322)

## 2012-08-21 LAB — IMMUNOFIXATION ELECTROPHORESIS: IgG (Immunoglobin G), Serum: 1380 mg/dL (ref 690–1700)

## 2012-08-26 ENCOUNTER — Ambulatory Visit (HOSPITAL_COMMUNITY)
Admission: RE | Admit: 2012-08-26 | Discharge: 2012-08-26 | Disposition: A | Discharge status: Home / Self Care | Payer: Medicare Other | Source: Ambulatory Visit | Attending: Hematology and Oncology | Admitting: Hematology and Oncology

## 2012-08-26 ENCOUNTER — Ambulatory Visit (HOSPITAL_COMMUNITY): Payer: Medicare Other

## 2012-08-26 DIAGNOSIS — R05 Cough: Secondary | ICD-10-CM | POA: Insufficient documentation

## 2012-08-26 DIAGNOSIS — R599 Enlarged lymph nodes, unspecified: Secondary | ICD-10-CM

## 2012-08-26 DIAGNOSIS — M545 Low back pain, unspecified: Secondary | ICD-10-CM | POA: Insufficient documentation

## 2012-08-26 DIAGNOSIS — R059 Cough, unspecified: Secondary | ICD-10-CM | POA: Insufficient documentation

## 2012-08-26 DIAGNOSIS — K7460 Unspecified cirrhosis of liver: Secondary | ICD-10-CM | POA: Insufficient documentation

## 2012-08-26 DIAGNOSIS — D696 Thrombocytopenia, unspecified: Secondary | ICD-10-CM

## 2012-08-26 MED ORDER — IOHEXOL 300 MG/ML  SOLN
100.0000 mL | Freq: Once | INTRAMUSCULAR | Status: AC | PRN
  Administered 2012-08-26: 100 mL via INTRAVENOUS

## 2012-09-02 ENCOUNTER — Other Ambulatory Visit (HOSPITAL_COMMUNITY): Payer: Self-pay | Admitting: Oncology

## 2012-09-02 DIAGNOSIS — N6459 Other signs and symptoms in breast: Secondary | ICD-10-CM

## 2012-09-16 ENCOUNTER — Ambulatory Visit (HOSPITAL_COMMUNITY)
Admission: RE | Admit: 2012-09-16 | Discharge: 2012-09-16 | Disposition: A | Discharge status: Home / Self Care | Payer: Medicare Other | Source: Ambulatory Visit | Attending: Oncology | Admitting: Oncology

## 2012-09-16 DIAGNOSIS — N63 Unspecified lump in unspecified breast: Secondary | ICD-10-CM | POA: Insufficient documentation

## 2012-09-16 DIAGNOSIS — N6459 Other signs and symptoms in breast: Secondary | ICD-10-CM

## 2012-09-21 ENCOUNTER — Encounter (HOSPITAL_COMMUNITY): Payer: Medicare Other | Attending: Hematology and Oncology

## 2012-09-21 DIAGNOSIS — D696 Thrombocytopenia, unspecified: Secondary | ICD-10-CM | POA: Insufficient documentation

## 2012-09-21 DIAGNOSIS — R599 Enlarged lymph nodes, unspecified: Secondary | ICD-10-CM | POA: Insufficient documentation

## 2012-09-21 DIAGNOSIS — K7460 Unspecified cirrhosis of liver: Secondary | ICD-10-CM | POA: Insufficient documentation

## 2012-09-21 LAB — CBC WITH DIFFERENTIAL/PLATELET
Basophils Absolute: 0 10*3/uL (ref 0.0–0.1)
Basophils Relative: 0 % (ref 0–1)
Hemoglobin: 12.8 g/dL (ref 12.0–15.0)
Lymphocytes Relative: 30 % (ref 12–46)
MCHC: 33.5 g/dL (ref 30.0–36.0)
Monocytes Relative: 14 % — ABNORMAL HIGH (ref 3–12)
Neutro Abs: 2.5 10*3/uL (ref 1.7–7.7)
Neutrophils Relative %: 51 % (ref 43–77)
RDW: 12.6 % (ref 11.5–15.5)
WBC: 5 10*3/uL (ref 4.0–10.5)

## 2012-09-21 LAB — COMPREHENSIVE METABOLIC PANEL
AST: 31 U/L (ref 0–37)
Albumin: 3.3 g/dL — ABNORMAL LOW (ref 3.5–5.2)
BUN: 10 mg/dL (ref 6–23)
Calcium: 9.2 mg/dL (ref 8.4–10.5)
Creatinine, Ser: 0.73 mg/dL (ref 0.50–1.10)
Total Bilirubin: 0.4 mg/dL (ref 0.3–1.2)
Total Protein: 7.4 g/dL (ref 6.0–8.3)

## 2012-09-21 NOTE — Progress Notes (Signed)
Labs drawn today for cbc/diff,cmp 

## 2012-09-23 ENCOUNTER — Encounter (HOSPITAL_BASED_OUTPATIENT_CLINIC_OR_DEPARTMENT_OTHER): Payer: Medicare Other | Admitting: Oncology

## 2012-09-23 ENCOUNTER — Encounter (HOSPITAL_COMMUNITY): Payer: Self-pay | Admitting: Oncology

## 2012-09-23 VITALS — BP 150/70 | HR 57 | Temp 98.2°F | Resp 20 | Wt 235.3 lb

## 2012-09-23 DIAGNOSIS — R161 Splenomegaly, not elsewhere classified: Secondary | ICD-10-CM

## 2012-09-23 DIAGNOSIS — D696 Thrombocytopenia, unspecified: Secondary | ICD-10-CM

## 2012-09-23 DIAGNOSIS — K7460 Unspecified cirrhosis of liver: Secondary | ICD-10-CM

## 2012-09-23 DIAGNOSIS — D472 Monoclonal gammopathy: Secondary | ICD-10-CM

## 2012-09-23 HISTORY — DX: Splenomegaly, not elsewhere classified: R16.1

## 2012-09-23 HISTORY — DX: Monoclonal gammopathy: D47.2

## 2012-09-23 LAB — CBC WITH DIFFERENTIAL/PLATELET
Basophils Relative: 0 % (ref 0–1)
Eosinophils Relative: 4 % (ref 0–5)
Hemoglobin: 12.6 g/dL (ref 12.0–15.0)
Lymphs Abs: 2.1 10*3/uL (ref 0.7–4.0)
MCH: 32.5 pg (ref 26.0–34.0)
MCV: 98.7 fL (ref 78.0–100.0)
Monocytes Absolute: 0.6 10*3/uL (ref 0.1–1.0)
Monocytes Relative: 12 % (ref 3–12)
Platelets: 81 10*3/uL — ABNORMAL LOW (ref 150–400)
RBC: 3.88 MIL/uL (ref 3.87–5.11)
WBC: 5 10*3/uL (ref 4.0–10.5)

## 2012-09-23 NOTE — Patient Instructions (Addendum)
.  [facility] Discharge Instructions  RECOMMENDATIONS MADE BY THE CONSULTANT AND ANY TEST RESULTS WILL BE SENT TO YOUR REFERRING PHYSICIAN.  EXAM FINDINGS BY THE PHYSICIAN TODAY AND SIGNS OR SYMPTOMS TO REPORT TO CLINIC OR PRIMARY PHYSICIAN:   We will repeat one lab test today to check your platelets SPECIAL INSTRUCTIONS/FOLLOW-UP: You will need labs and see Tom on same day in 3 months  You will need CT scan and labs in 6 months then to see Dr. Mariel Sleet Follow up with mammogram as directed by radiology  Thank you for choosing [facility] to provide your oncology and hematology care.  To afford each patient quality time with our providers, please arrive at least 15 minutes before your scheduled appointment time.  With your help, our goal is to use those 15 minutes to complete the necessary work-up to ensure our physicians have the information they need to help with your evaluation and healthcare recommendations.    Effective January 1st, 2014, we ask that you re-schedule your appointment with our physicians should you arrive 10 or more minutes late for your appointment.  We strive to give you quality time with our providers, and arriving late affects you and other patients whose appointments are after yours.    Again, thank you for choosing [facility].  Our hope is that these requests will decrease the amount of time that you wait before being seen by our physicians.       _____________________________________________________________  Should you have questions after your visit to [facility], please contact our office at [phone] between the hours of 8:30 a.m. and 5:00 p.m.  Voicemails left after 4:30 p.m. will not be returned until the following business day.  For prescription refill requests, have your pharmacy contact our office with your prescription refill request.

## 2012-09-23 NOTE — Progress Notes (Signed)
Colette Ribas, MD [address] [place] [address]  Thrombocytopenia - Plan: CBC with Differential, CBC with Differential, Comprehensive metabolic panel, CBC with Differential, Comprehensive metabolic panel, CT Abdomen Pelvis W Contrast, CBC with Differential, Beta 2 microglobuline, serum, Viscosity, Serum, Multiple myeloma panel, serum, Kappa/lambda light chains  Cirrhosis - Plan: CBC with Differential, CBC with Differential, Comprehensive metabolic panel, CBC with Differential, Comprehensive metabolic panel, CT Abdomen Pelvis W Contrast, CBC with Differential, Beta 2 microglobuline, serum, Viscosity, Serum, Multiple myeloma panel, serum, Kappa/lambda light chains  Splenomegaly  IgM monoclonal gammopathy of uncertain significance  CURRENT THERAPY: Observation  INTERVAL HISTORY: Melanie Porter 72 y.o. female returns for  regular  visit for followup of thrombocytopenia in the setting of cirrhosis of liver and splenomegaly with also some intra-abdominal lymphadenopathy.    I personally reviewed and went over laboratory results with the patient. Laboratory work shows a white blood cell count of 5.0, hemoglobin 12.8 g/dL, platelet clumping, unremarkable metabolic panel, and an IgM kappa monoclonal protein that could potentially be secondary to chronic inflammation of liver due to cirrhosis, but could also represent a MGUS or Waldenstrom macroglobulinemia potentially.  I personally reviewed and went over radiographic studies with the patient.  We'll report follows, but CT scan showed stability of lymphadenopathy, confirms liver cirrhosis, and also reveals splenomegaly. We also reviewed her diagnostic mammograms. She is due to have a repeat diagnostic mammograms in 4-6 months and she reports this are scheduled for October. She is encouraged to continue with this appointment as scheduled.  I spent time with the patient reviewing the aforementioned information.  Provide her education regarding cirrhosis of liver and associated splenomegaly and how this affects her blood counts.  She denies any trauma to the breasts that she is aware of.  She reports that a while back, she was falling and may have injured one of her breasts, but she's not positive on this piece of information.  She denies any B. symptomatology including fevers, chills, night sweats, unintentional weight loss, and decreased appetite. She denies any early satiety.  Hematologically, the patient denies any complaints and ROS questioning is negative.   Past Medical History  Diagnosis Date  . Depression   . Anxiety   . Arthritis   . Obesity   . Hypercholesteremia   . Fibromyalgia   . GERD (gastroesophageal reflux disease)   . Back fracture 05/21/10  . Knee fracture, right 05/21/10  . Ankle fracture, right 05/21/10  . Thrombocytopenia 02/19/2012  . Nonalcoholic steatohepatitis (NASH) BMI > 40    NOV 2013 CHILD PUGH A, MELD SCORE 7  . Splenomegaly 09/23/2012  . IgM monoclonal gammopathy of uncertain significance 09/23/2012    has Fibromyalgia; Hyperlipidemia; Arthritis; GERD (gastroesophageal reflux disease); Thrombocytopenia; Cirrhosis; Splenomegaly; and IgM monoclonal gammopathy of uncertain significance on her problem list.     has no allergies on file.  Ms. Konczal does not currently have medications on file.  Past Surgical History  Procedure Laterality Date  . Breast biopsy  1982    left benign  . Partial hysterectomy    . Esophagogastroduodenoscopy  03/02/2012    Procedure: ESOPHAGOGASTRODUODENOSCOPY (EGD);  Surgeon: West Bali, MD;  Location: [facility];  Service: Endoscopy;  Laterality: N/A;  1:30    Denies any headaches, dizziness, double vision, fevers, chills, night sweats, nausea, vomiting, diarrhea, constipation, chest pain, heart palpitations, shortness of breath, blood in stool, black tarry stool, urinary pain, urinary burning, urinary  frequency, hematuria.     PHYSICAL EXAMINATION  ECOG PERFORMANCE STATUS: 2 - Symptomatic, <50% confined to bed  Filed Vitals:   09/23/12 1437  BP: 150/70  Pulse: 57  Temp: 98.2 F (36.8 C)  Resp: 20    GENERAL:alert, no distress, well nourished, well developed, comfortable, cooperative, obese and smiling SKIN: skin color, texture, turgor are normal, no rashes or significant lesions HEAD: Normocephalic, No masses, lesions, tenderness or abnormalities EYES: normal, Conjunctiva are pink and non-injected EARS: External ears normal OROPHARYNX:mucous membranes are moist  NECK: supple, no adenopathy, thyroid normal size, non-tender, without nodularity, no stridor, non-tender, trachea midline LYMPH:  no palpable lymphadenopathy, no hepatosplenomegaly BREAST:breasts appear normal, no suspicious masses, no skin or nipple changes or axillary nodes, B/L fibroglandular tissue in breast LUNGS: clear to auscultation and percussion HEART: regular rate & rhythm, no murmurs, no gallops, S1 normal and S2 normal ABDOMEN:abdomen soft, non-tender, obese, normal bowel sounds, no masses or organomegaly, difficult to assess for hepatosplenomegaly due to body habitus and no hepatosplenomegaly BACK: Back symmetric, no curvature., No CVA tenderness EXTREMITIES:less then 2 second capillary refill, no joint deformities, effusion, or inflammation, no edema, no skin discoloration, no clubbing, no cyanosis  NEURO: alert & oriented x 3 with fluent speech, no focal motor/sensory deficits, uses a cane for stability when ambulating and she does have a limp secondary to back pain and arthritis    LABORATORY DATA: CBC    Component Value Date/Time   WBC PENDING 09/23/2012 1550   RBC 3.88 09/23/2012 1550   HGB 12.6 09/23/2012 1550   HCT 38.3 09/23/2012 1550   PLT PENDING 09/23/2012 1550   MCV 98.7 09/23/2012 1550   MCH 32.5 09/23/2012 1550   MCHC 32.9 09/23/2012 1550   RDW 12.6 09/23/2012 1550   LYMPHSABS PENDING 09/23/2012 1550   MONOABS  PENDING 09/23/2012 1550   EOSABS PENDING 09/23/2012 1550   BASOSABS PENDING 09/23/2012 1550      Chemistry      Component Value Date/Time   NA 141 09/21/2012 0854   K 4.0 09/21/2012 0854   CL 103 09/21/2012 0854   CO2 30 09/21/2012 0854   BUN 10 09/21/2012 0854   CREATININE 0.73 09/21/2012 0854   CREATININE 0.71 02/24/2012 1028      Component Value Date/Time   CALCIUM 9.2 09/21/2012 0854   ALKPHOS 108 09/21/2012 0854   AST 31 09/21/2012 0854   ALT 21 09/21/2012 0854   BILITOT 0.4 09/21/2012 0854      Results for ZIGGY, REVELES (MRN [id number]) as of 09/23/2012 15:38  Ref. Range 08/19/2012 10:24 08/19/2012 10:24 08/19/2012 10:25  Beta-2 Microglobulin Latest Range: 1.01-1.73 mg/L 2.15 (H)    Total Protein ELP Latest Range: 6.0-8.3 g/dL  6.9   Albumin ELP Latest Range: 55.8-66.1 %   48.2 (L)  Alpha-1-Globulin Latest Range: 2.9-4.9 %   4.0  Alpha-2-Globulin Latest Range: 7.1-11.8 %   11.1  Beta Globulin Latest Range: 4.7-7.2 %   5.4  Beta 2 Latest Range: 3.2-6.5 %   6.5  Gamma Globulin Latest Range: 11.1-18.8 %   24.8 (H)  M-SPIKE, % No range found   0.31  SPE Interp. No range found   (NOTE)  Comment No range found   (NOTE)  IgG (Immunoglobin G), Serum Latest Range: [phone] mg/dL [phone]  IgA Latest Range: 69-380 mg/dL 578 (H) 469 (H) 629 (H)  IgM, Serum Latest Range: 52-322 mg/dL 528 413 244  Kappa free light chain Latest Range: 0.33-1.94 mg/dL 0.10 (  H)    Lamda free light chains Latest Range: 0.57-2.63 mg/dL 16.10 (H)    Kappa, lamda light chain ratio Latest Range: 0.26-1.65  0.19 (L)        RADIOGRAPHIC STUDIES:  08/26/2012  *RADIOLOGY REPORT*  Clinical Data: Cough, low back pain, thrombocytopenia, enlarged  lymph nodes.  CT CHEST, ABDOMEN AND PELVIS WITH CONTRAST  Technique: Multidetector CT imaging of the chest, abdomen and  pelvis was performed following the standard protocol during bolus  administration of intravenous contrast.  Contrast: OMNIPAQUE IOHEXOL  300 MG/ML SOLN  Comparison: CT abdomen pelvis 02/07/2012 and CT chest 06/10/2010.  CT CHEST  Findings: No pathologically enlarged mediastinal, hilar or  axillary lymph nodes. Probable prominent pericardial recess  adjacent to the ascending aorta (image 17), unchanged. Breast  calcifications are seen bilaterally. Somewhat nodular appearing  soft tissue is seen in the lateral right breast. Heart is mildly  enlarged. No pericardial effusion.  A few scattered tiny nodular densities in the right lung are likely  unchanged. No pleural fluid. Airway is unremarkable.  IMPRESSION:  1. No acute findings in the chest.  2. Somewhat nodular appearing soft tissue in the lateral right  breast. Correlation with physical exam and mammogram recommended,  as clinically indicated.  CT ABDOMEN AND PELVIS  Findings: Liver contour is irregular. No definite focal lesions.  Gallbladder and adrenal glands are unremarkable. A sub centimeter  low attenuation lesion in the right kidney is unchanged and likely  a cyst. Kidneys, spleen, pancreas, stomach and bowel are otherwise  unremarkable.  Hysterectomy. Ovaries are visualized. Atherosclerotic  calcification of the arterial vasculature without abdominal aortic  aneurysm. Gastrohepatic ligament lymph node measures 1.5 cm  (previously 1.4 cm). Portacaval lymph node measures 2.2 cm,  unchanged. Retroperitoneal lymph nodes are otherwise sub  centimeter in size. No free fluid. No worrisome lytic or  sclerotic lesions. T12 compression fracture, as before.  IMPRESSION:  1. Cirrhosis.  2. Stable enlargement of gastrohepatic ligament and portacaval  lymph nodes.  Original Report Authenticated By: Leanna Battles, M.D.    09/16/2012  *RADIOLOGY REPORT*  Clinical Data: The patient had a recent CT scan of the chest  showing nodular soft tissue in the lateral aspect of the right  breast. The patient had a stereotactic biopsy of the left breast in  April 2014. The  pathology was benign and 65-month follow-up left  mammogram was recommended.  DIGITAL DIAGNOSTIC RIGHT MAMMOGRAM WITH CAD  Comparison: Chest CT dated 08/26/2012 and prior mammograms from  [facility] dated 06/30/2012, 11/09/2010 and 10/28/2008.  Findings:  ACR Breast Density Category 2: There is a scattered fibroglandular  pattern.  There is no new suspicious mass, malignant-type microcalcifications  or distortion. The asymmetric tissue in the lateral aspect of the  right breast is stable when compared to the prior exams.  Mammographic images were processed with CAD.  IMPRESSION:  No evidence of malignancy in the right breast.  RECOMMENDATION:  The patient is due for a diagnostic left mammogram in 4 months. If  the exam is stable at that time bilateral diagnostic mammogram in 6  months would be suggested to place the patient back on a yearly  schedule.  I have discussed the findings and recommendations with the patient.  Results were also provided in writing at the conclusion of the  visit. If applicable, a reminder letter will be sent to the  patient regarding her next appointment.  BI-RADS CATEGORY 1: Negative.  Original Report Authenticated By: Norwood Levo  Judyann Munson, M.D.     ASSESSMENT:  1. Thrombocytopenia in setting of liver cirrhosis and splenomegaly 2. Cirrhosis of liver 3. Splenomegaly 4. IgM Kappa monoclonal protein, possibly a MGUS, Waldenstrom, or secondary to chronic inflammation secondary to cirrhosis of liver  Patient Active Problem List   Diagnosis Date Noted  . Splenomegaly 09/23/2012  . 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 Cirrhosis 02/25/2012  . Thrombocytopenia 02/19/2012  . Fibromyalgia 02/04/2012  . Hyperlipidemia 02/04/2012  . Arthritis 02/04/2012  . GERD (gastroesophageal reflux disease) 02/04/2012    PLAN:  1. I personally reviewed and went over laboratory results with the patient. 2. I personally reviewed and went  over radiographic studies with the patient. 3. Lab work today: CBC diff and citrate tube for platelet testing 4. Lab work in 3 months: CBC diff, CMET, MM panel, Beta2 MG, Serum viscosity 5. Lab work in 6 months: CBC diff, CMET 6. CT abd/pelvis with contrast in 6 months 7. Diagnostic mammogram is required in both left and right breast in 4-6 months. Patient reports that this is scheduled for October 2014 timeframe.  8. Return in 3 months and 6 months for follow-up   THERAPY PLAN:  This is a very interesting patient. She does have elevation of IgM, monoclonal protein, and this could be indicative of number of things including MGUS, Waldenstrm's, or chronic inflammation of liver secondary to cirrhosis.  We'll perform some laboratory work today and then we will perform more lab work in 3 months time. We've asked to keep a bone marrow aspiration and biopsy on her list of possible interventions in the future depending on results of laboratory work. The role of CT scan in 6 months as approve stability of lymphadenopathy. We'll continue to follow this patient along.  All questions were answered. The patient knows to call the clinic with any problems, questions or concerns. We can certainly see the patient much sooner if necessary.  The patient and plan discussed with Glenford Peers, MD and he is in agreement with the aforementioned.  KEFALAS,THOMAS

## 2012-12-20 DIAGNOSIS — D731 Hypersplenism: Secondary | ICD-10-CM | POA: Insufficient documentation

## 2012-12-24 ENCOUNTER — Encounter (HOSPITAL_COMMUNITY): Payer: Medicare Other | Attending: Hematology and Oncology

## 2012-12-24 DIAGNOSIS — K7460 Unspecified cirrhosis of liver: Secondary | ICD-10-CM | POA: Insufficient documentation

## 2012-12-24 DIAGNOSIS — D696 Thrombocytopenia, unspecified: Secondary | ICD-10-CM

## 2012-12-24 DIAGNOSIS — D472 Monoclonal gammopathy: Secondary | ICD-10-CM | POA: Insufficient documentation

## 2012-12-24 DIAGNOSIS — D731 Hypersplenism: Secondary | ICD-10-CM | POA: Insufficient documentation

## 2012-12-24 LAB — CBC WITH DIFFERENTIAL/PLATELET
Eosinophils Absolute: 0.1 10*3/uL (ref 0.0–0.7)
Hemoglobin: 13.1 g/dL (ref 12.0–15.0)
Lymphs Abs: 1.6 10*3/uL (ref 0.7–4.0)
Monocytes Relative: 12 % (ref 3–12)
Neutro Abs: 2.5 10*3/uL (ref 1.7–7.7)
Neutrophils Relative %: 51 % (ref 43–77)
Platelets: DECREASED 10*3/uL (ref 150–400)
RBC: 4.05 MIL/uL (ref 3.87–5.11)
WBC: 4.8 10*3/uL (ref 4.0–10.5)

## 2012-12-24 LAB — COMPREHENSIVE METABOLIC PANEL
ALT: 18 U/L (ref 0–35)
Albumin: 3.3 g/dL — ABNORMAL LOW (ref 3.5–5.2)
Alkaline Phosphatase: 116 U/L (ref 39–117)
BUN: 11 mg/dL (ref 6–23)
Chloride: 104 mEq/L (ref 96–112)
GFR calc Af Amer: >90 mL/min (ref >90)
Glucose, Bld: 114 mg/dL — ABNORMAL HIGH (ref 70–99)
Potassium: 3.6 mEq/L (ref 3.5–5.1)
Sodium: 140 mEq/L (ref 135–145)
Total Bilirubin: 0.5 mg/dL (ref 0.3–1.2)
Total Protein: 7.6 g/dL (ref 6.0–8.3)

## 2012-12-24 NOTE — Progress Notes (Signed)
Labs drawn today for cbc/diff,cmp,Beta 2, mm,kllc

## 2012-12-24 NOTE — Progress Notes (Signed)
Need last colonoscopy reports. I'm sorry, I can't remember where she said this was completed. Just need for our records.

## 2012-12-25 LAB — KAPPA/LAMBDA LIGHT CHAINS
Kappa, lambda light chain ratio: 0.16 — ABNORMAL LOW (ref 0.26–1.65)
Lambda free light chains: 17.1 mg/dL — ABNORMAL HIGH (ref 0.57–2.63)

## 2012-12-25 LAB — BETA 2 MICROGLOBULIN, SERUM: Beta-2 Microglobulin: 2.18 mg/L — ABNORMAL HIGH (ref 1.01–1.73)

## 2012-12-25 NOTE — Progress Notes (Signed)
Pt cannot remember doctor's name, pt states she thinks she had it done on [address] in [place], I called that office and they don't open until 8:30.  Did pt tell you which doctor she seen?

## 2012-12-25 NOTE — Progress Notes (Signed)
I mailed pt a sign of release form so we can get records from her last colonoscopy.

## 2012-12-28 ENCOUNTER — Encounter (HOSPITAL_BASED_OUTPATIENT_CLINIC_OR_DEPARTMENT_OTHER): Payer: Medicare Other

## 2012-12-28 ENCOUNTER — Encounter (HOSPITAL_COMMUNITY): Payer: Self-pay

## 2012-12-28 VITALS — BP 90/60 | HR 62 | Temp 98.5°F | Resp 18 | Wt 234.6 lb

## 2012-12-28 DIAGNOSIS — D691 Qualitative platelet defects: Secondary | ICD-10-CM | POA: Insufficient documentation

## 2012-12-28 DIAGNOSIS — L259 Unspecified contact dermatitis, unspecified cause: Secondary | ICD-10-CM

## 2012-12-28 DIAGNOSIS — L309 Dermatitis, unspecified: Secondary | ICD-10-CM | POA: Insufficient documentation

## 2012-12-28 DIAGNOSIS — D731 Hypersplenism: Secondary | ICD-10-CM

## 2012-12-28 DIAGNOSIS — D472 Monoclonal gammopathy: Secondary | ICD-10-CM

## 2012-12-28 DIAGNOSIS — D696 Thrombocytopenia, unspecified: Secondary | ICD-10-CM

## 2012-12-28 DIAGNOSIS — K7460 Unspecified cirrhosis of liver: Secondary | ICD-10-CM

## 2012-12-28 DIAGNOSIS — R161 Splenomegaly, not elsewhere classified: Secondary | ICD-10-CM

## 2012-12-28 LAB — MULTIPLE MYELOMA PANEL, SERUM
Albumin ELP: 47 % — ABNORMAL LOW (ref 55.8–66.1)
Alpha-1-Globulin: 6 % — ABNORMAL HIGH (ref 2.9–4.9)
Beta 2: 5.5 % (ref 3.2–6.5)
Beta Globulin: 6 % (ref 4.7–7.2)
Gamma Globulin: 24.8 % — ABNORMAL HIGH (ref 11.1–18.8)
IgM, Serum: 240 mg/dL (ref 52–322)

## 2012-12-28 MED ORDER — NYSTATIN-TRIAMCINOLONE 100000-0.1 UNIT/GM-% EX OINT: TOPICAL_OINTMENT | Freq: Two times a day (BID) | CUTANEOUS | Status: DC

## 2012-12-28 NOTE — Patient Instructions (Addendum)
[facility] Discharge Instructions  RECOMMENDATIONS MADE BY THE CONSULTANT AND ANY TEST RESULTS WILL BE SENT TO YOUR REFERRING PHYSICIAN.  EXAM FINDINGS BY THE PHYSICIAN TODAY AND SIGNS OR SYMPTOMS TO REPORT TO CLINIC OR PRIMARY PHYSICIAN: Exam and findings as discussed by Dr. Zigmund Daniel. Will let you know if there is any problems with your bloodwork.  MEDICATIONS PRESCRIBED:  Mycolog ointment - use as directed  INSTRUCTIONS/FOLLOW-UP: CT scans as ordered in December and return in 6 months with labs and office visit.  Thank you for choosing [facility] to provide your oncology and hematology care.  To afford each patient quality time with our providers, please arrive at least 15 minutes before your scheduled appointment time.  With your help, our goal is to use those 15 minutes to complete the necessary work-up to ensure our physicians have the information they need to help with your evaluation and healthcare recommendations.    Effective January 1st, 2014, we ask that you re-schedule your appointment with our physicians should you arrive 10 or more minutes late for your appointment.  We strive to give you quality time with our providers, and arriving late affects you and other patients whose appointments are after yours.    Again, thank you for choosing [facility].  Our hope is that these requests will decrease the amount of time that you wait before being seen by our physicians.       _____________________________________________________________  Should you have questions after your visit to [facility], please contact our office at [phone] between the hours of 8:30 a.m. and 5:00 p.m.  Voicemails left after 4:30 p.m. will not be returned until the following business day.  For prescription refill requests, have your pharmacy contact our office with your prescription refill request.

## 2012-12-28 NOTE — Progress Notes (Signed)
[facility] OFFICE PROGRESS NOTE  Colette Ribas, MD [address] [place] [address]  DIAGNOSIS: Hypersplenism - Plan: Transferrin Receptor, Soluable  Thrombocytopenia  Cirrhosis  IgM monoclonal gammopathy of uncertain significance - Plan: IgG, IgA, IgM, Beta 2 microglobuline, serum, Lactate dehydrogenase, Kappa/lambda light chains  Splenomegaly  Platelet disorder(CLUMPING)  Chief Complaint  Patient presents with  . Follow-up    CURRENT THERAPY: Observation only.   INTERVAL HISTORY: [place] 72 y.o. female returns for followup of thrombocytopenia in the setting of cirrhosis of the liver with splenomegaly and intra-abdominal lymphadenopathy. He is complaining today of bilateral shoulder discomfort when she raises her arms along with perineal burning and itching. She also has bilateral knee and ankle pain with walking as well as low back pain although she has had for many years. She takes hydrocodone which produces pruritus. She denies any epistaxis but does bruise easily. She denies any melena, hematochezia, hematuria, vaginal bleeding, sore throat, skin rash, headache, or seizures. Lower extremity swelling is minimal.   MEDICAL HISTORY: Past Medical History  Diagnosis Date  . Depression   . Anxiety   . Arthritis   . Obesity   . Hypercholesteremia   . Fibromyalgia   . GERD (gastroesophageal reflux disease)   . Back fracture 05/21/10  . Knee fracture, right 05/21/10  . Ankle fracture, right 05/21/10  . Thrombocytopenia 02/19/2012  . Nonalcoholic steatohepatitis (NASH) BMI > 40    NOV 2013 CHILD PUGH A, MELD SCORE 7  . Splenomegaly 09/23/2012  . IgM monoclonal gammopathy of uncertain significance 09/23/2012    INTERIM HISTORY: has Fibromyalgia; Hyperlipidemia; Arthritis; GERD (gastroesophageal reflux disease); Thrombocytopenia; Cirrhosis; Splenomegaly; IgM monoclonal gammopathy of uncertain significance; Hypersplenism;  and Platelet disorder(CLUMPING) on her problem list.    ALLERGIES:  has no allergies on file.  MEDICATIONS: has a current medication list which includes the following prescription(s): bisacodyl, calcium carbonate-vit d-min, cyanocobalamin, docusate sodium, flaxseed oil, fluoxetine, hydrocodone-acetaminophen, loratadine, multiple vitamins-minerals, omeprazole, propylene glycol, psyllium, simvastatin, trazodone, and trolamine salicylate.  SURGICAL HISTORY:  Past Surgical History  Procedure Laterality Date  . Breast biopsy  1982    left benign  . Partial hysterectomy    . Esophagogastroduodenoscopy  03/02/2012    Procedure: ESOPHAGOGASTRODUODENOSCOPY (EGD);  Surgeon: West Bali, MD;  Location: [facility];  Service: Endoscopy;  Laterality: N/A;  1:30    FAMILY HISTORY: family history includes Diabetes in her sister; Heart disease in her brother, father, and sister; Hypertension in her brother and sister. There is no history of Colon cancer.  SOCIAL HISTORY:  reports that she has never smoked. She has never used smokeless tobacco. She reports that she does not drink alcohol or use illicit drugs.  REVIEW OF SYSTEMS:  Other than that discussed above is noncontributory.  PHYSICAL EXAMINATION: ECOG PERFORMANCE STATUS: 2 - Symptomatic, <50% confined to bed  Weight 234 lb 9.6 oz (106.414 kg).  GENERAL:alert, no distress and comfortable. Morbidly obese. SKIN: skin color, texture, turgor are normal, no rashes or significant lesions. Perineal macular erythema. EYES: normal, Conjunctiva are pink and non-injected, sclera clear OROPHARYNX:no exudate, no erythema and lips, buccal mucosa, and tongue normal  NECK: supple, thyroid normal size, non-tender, without nodularity CHEST: Normal AP diameter. No breast masses. LYMPH:  no palpable lymphadenopathy in the cervical, axillary or inguinal LUNGS: clear to auscultation and percussion with normal breathing effort HEART: regular rate & rhythm and  no murmurs and no lower extremity edema ABDOMEN:abdomen soft,  non-tender and normal bowel sounds. No free fluid wave or shifting dullness. Musculoskeletal:no cyanosis of digits and no clubbing  NEURO: alert & oriented x 3 with fluent speech, no focal motor/sensory deficits   LABORATORY DATA: Infusion on 12/24/2012  Component Date Value Range Status  . WBC 12/24/2012 4.8  4.0 - 10.5 K/uL Final  . RBC 12/24/2012 4.05  3.87 - 5.11 MIL/uL Final  . Hemoglobin 12/24/2012 13.1  12.0 - 15.0 g/dL Final  . HCT 16/12/9602 39.5  36.0 - 46.0 % Final  . MCV 12/24/2012 97.5  78.0 - 100.0 fL Final  . MCH 12/24/2012 32.3  26.0 - 34.0 pg Final  . MCHC 12/24/2012 33.2  30.0 - 36.0 g/dL Final  . RDW 54/11/8117 12.6  11.5 - 15.5 % Final  . Platelets 12/24/2012 PLATELET CLUMPS NOTED ON SMEAR, COUNT APPEARS DECREASED  150 - 400 K/uL Final  . Neutrophils Relative % 12/24/2012 51  43 - 77 % Final  . Neutro Abs 12/24/2012 2.5  1.7 - 7.7 K/uL Final  . Lymphocytes Relative 12/24/2012 34  12 - 46 % Final  . Lymphs Abs 12/24/2012 1.6  0.7 - 4.0 K/uL Final  . Monocytes Relative 12/24/2012 12  3 - 12 % Final  . Monocytes Absolute 12/24/2012 0.6  0.1 - 1.0 K/uL Final  . Eosinophils Relative 12/24/2012 3  0 - 5 % Final  . Eosinophils Absolute 12/24/2012 0.1  0.0 - 0.7 K/uL Final  . Basophils Relative 12/24/2012 0  0 - 1 % Final  . Basophils Absolute 12/24/2012 0.0  0.0 - 0.1 K/uL Final  . Sodium 12/24/2012 140  135 - 145 mEq/L Final  . Potassium 12/24/2012 3.6  3.5 - 5.1 mEq/L Final  . Chloride 12/24/2012 104  96 - 112 mEq/L Final  . CO2 12/24/2012 27  19 - 32 mEq/L Final  . Glucose, Bld 12/24/2012 114* 70 - 99 mg/dL Final  . BUN 14/78/2956 11  6 - 23 mg/dL Final  . Creatinine, Ser 12/24/2012 0.72  0.50 - 1.10 mg/dL Final  . Calcium 21/30/8657 9.5  8.4 - 10.5 mg/dL Final  . Total Protein 12/24/2012 7.6  6.0 - 8.3 g/dL Final  . Albumin 84/69/6295 3.3* 3.5 - 5.2 g/dL Final  . AST 28/41/3244 27  0 - 37 U/L Final   . ALT 12/24/2012 18  0 - 35 U/L Final  . Alkaline Phosphatase 12/24/2012 116  39 - 117 U/L Final  . Total Bilirubin 12/24/2012 0.5  0.3 - 1.2 mg/dL Final  . GFR calc non Af Amer 12/24/2012 84* >90 mL/min Final  . GFR calc Af Amer 12/24/2012 >90  >90 mL/min Final   Comment: (NOTE)                          The eGFR has been calculated using the CKD EPI equation.                          This calculation has not been validated in all clinical situations.                          eGFR's persistently <90 mL/min signify possible Chronic Kidney                          Disease.  . Beta-2 Microglobulin 12/24/2012 2.18* 1.01 - 1.73 mg/L Final  Performed at [facility]  . Viscosity, Serum 12/24/2012 1.7  1.5 - 1.9 rel to H2O Final   Comment: Units = Relative to Water                          Performed at [facility]  . Total Protein 12/24/2012 7.1  6.0 - 8.3 g/dL Final  . Albumin ELP 13/10/6576 PENDING  55.8 - 66.1 % Incomplete  . Alpha-1-Globulin 12/24/2012 PENDING  2.9 - 4.9 % Incomplete  . Alpha-2-Globulin 12/24/2012 PENDING  7.1 - 11.8 % Incomplete  . Beta Globulin 12/24/2012 PENDING  4.7 - 7.2 % Incomplete  . Beta 2 12/24/2012 PENDING  3.2 - 6.5 % Incomplete  . Gamma Globulin 12/24/2012 PENDING  11.1 - 18.8 % Incomplete  . M-Spike, % 12/24/2012 PENDING   Incomplete  . SPE Interp. 12/24/2012 PENDING   Incomplete  . Comment 12/24/2012 PENDING   Incomplete  . IgG (Immunoglobin G), Serum 12/24/2012 1500  690 - 1700 mg/dL Final  . IgA 46/96/2952 448* 69 - 380 mg/dL Final  . IgM, Serum 84/13/2440 240  52 - 322 mg/dL Final   Performed at [facility]  . Immunofix Electr Int 12/24/2012 PENDING   Incomplete  . Kappa free light chain 12/24/2012 2.68* 0.33 - 1.94 mg/dL Final  . Lamda free light chains 12/24/2012 17.10* 0.57 - 2.63 mg/dL Final  . Kappa, lamda light chain ratio 12/24/2012 0.16* 0.26 - 1.65 Final   Performed at [facility]     Urinalysis No results found for  this basename: colorurine,  appearanceur,  labspec,  phurine,  glucoseu,  hgbur,  bilirubinur,  ketonesur,  proteinur,  urobilinogen,  nitrite,  leukocytesur    RADIOGRAPHIC STUDIES: thrombocytopenia, enlarged  lymph nodes.  CT CHEST, ABDOMEN AND PELVIS WITH CONTRAST  Technique: Multidetector CT imaging of the chest, abdomen and  pelvis was performed following the standard protocol during bolus  administration of intravenous contrast.  Contrast: OMNIPAQUE IOHEXOL 300 MG/ML SOLN  Comparison: CT abdomen pelvis 02/07/2012 and CT chest 06/10/2010.  CT CHEST  Findings: No pathologically enlarged mediastinal, hilar or  axillary lymph nodes. Probable prominent pericardial recess  adjacent to the ascending aorta (image 17), unchanged. Breast  calcifications are seen bilaterally. Somewhat nodular appearing  soft tissue is seen in the lateral right breast. Heart is mildly  enlarged. No pericardial effusion.  A few scattered tiny nodular densities in the right lung are likely  unchanged. No pleural fluid. Airway is unremarkable.  IMPRESSION:  1. No acute findings in the chest.  2. Somewhat nodular appearing soft tissue in the lateral right  breast. Correlation with physical exam and mammogram recommended,  as clinically indicated.  CT ABDOMEN AND PELVIS  Findings: Liver contour is irregular. No definite focal lesions.  Gallbladder and adrenal glands are unremarkable. A sub centimeter  low attenuation lesion in the right kidney is unchanged and likely  a cyst. Kidneys, spleen, pancreas, stomach and bowel are otherwise  unremarkable.  Hysterectomy. Ovaries are visualized. Atherosclerotic  calcification of the arterial vasculature without abdominal aortic  aneurysm. Gastrohepatic ligament lymph node measures 1.5 cm  (previously 1.4 cm). Portacaval lymph node measures 2.2 cm,  unchanged. Retroperitoneal lymph nodes are otherwise sub  centimeter in size. No free fluid. No worrisome lytic  or  sclerotic lesions. T12 compression fracture, as before.  IMPRESSION:  1. Cirrhosis.  2. Stable enlargement of gastrohepatic ligament and portacaval  lymph nodes.  Original Report  Authenticated By: Leanna Battles, M.D   ASSESSMENT: #1. Cirrhosis with hypersplenism, stable. #2. IgM monoclonal gammopathy of uncertain significance, stable. #3. Fibromyalgia, on treatment. #4. Perineal dermatitis #5. Uncontrolled blood sugar.   PLAN: #1. Mycolog ointment to apply to the perineal area twice a day after washing, drying, and after application of the ointment-dosing with cornstarch. #2. Call if develops severe bruising, epistaxis, melena, hematochezia, vaginal bleeding, or hematuria. #3. Followup in 6 months with lab tests.   All questions were answered. The patient knows to call the clinic with any problems, questions or concerns. We can certainly see the patient much sooner if necessary.   I spent 30 minutes counseling the patient face to face. The total time spent in the appointment was 25 minutes.    Maurilio Lovely, MD 12/28/2012 11:29 AM

## 2013-01-06 NOTE — Progress Notes (Signed)
Requested Records.  

## 2013-01-14 ENCOUNTER — Other Ambulatory Visit (HOSPITAL_COMMUNITY): Payer: Self-pay | Admitting: Family Medicine

## 2013-01-14 DIAGNOSIS — Z09 Encounter for follow-up examination after completed treatment for conditions other than malignant neoplasm: Secondary | ICD-10-CM

## 2013-01-20 ENCOUNTER — Ambulatory Visit (HOSPITAL_COMMUNITY)
Admission: RE | Admit: 2013-01-20 | Discharge: 2013-01-20 | Disposition: A | Discharge status: Home / Self Care | Payer: Medicare Other | Source: Ambulatory Visit | Attending: Family Medicine | Admitting: Family Medicine

## 2013-01-20 DIAGNOSIS — R928 Other abnormal and inconclusive findings on diagnostic imaging of breast: Secondary | ICD-10-CM | POA: Insufficient documentation

## 2013-01-20 DIAGNOSIS — Z09 Encounter for follow-up examination after completed treatment for conditions other than malignant neoplasm: Secondary | ICD-10-CM | POA: Insufficient documentation

## 2013-03-29 ENCOUNTER — Encounter (HOSPITAL_COMMUNITY): Payer: Self-pay

## 2013-03-29 ENCOUNTER — Ambulatory Visit (HOSPITAL_COMMUNITY)
Admission: RE | Admit: 2013-03-29 | Discharge: 2013-03-29 | Disposition: A | Discharge status: Home / Self Care | Payer: Medicare Other | Source: Ambulatory Visit | Attending: Oncology | Admitting: Oncology

## 2013-03-29 ENCOUNTER — Other Ambulatory Visit (HOSPITAL_COMMUNITY): Payer: Medicare Other

## 2013-03-29 ENCOUNTER — Encounter (HOSPITAL_COMMUNITY): Payer: Medicare Other | Attending: Hematology and Oncology

## 2013-03-29 DIAGNOSIS — R599 Enlarged lymph nodes, unspecified: Secondary | ICD-10-CM | POA: Insufficient documentation

## 2013-03-29 DIAGNOSIS — K7460 Unspecified cirrhosis of liver: Secondary | ICD-10-CM | POA: Insufficient documentation

## 2013-03-29 DIAGNOSIS — R161 Splenomegaly, not elsewhere classified: Secondary | ICD-10-CM | POA: Insufficient documentation

## 2013-03-29 DIAGNOSIS — D696 Thrombocytopenia, unspecified: Secondary | ICD-10-CM

## 2013-03-29 LAB — COMPREHENSIVE METABOLIC PANEL
ALT: 25 U/L (ref 0–35)
AST: 36 U/L (ref 0–37)
Alkaline Phosphatase: 135 U/L — ABNORMAL HIGH (ref 39–117)
CO2: 26 mEq/L (ref 19–32)
Calcium: 9.4 mg/dL (ref 8.4–10.5)
Chloride: 103 mEq/L (ref 96–112)
GFR calc Af Amer: >90 mL/min (ref >90)
GFR calc non Af Amer: 87 mL/min — ABNORMAL LOW (ref >90)
Glucose, Bld: 105 mg/dL — ABNORMAL HIGH (ref 70–99)
Potassium: 4 mEq/L (ref 3.5–5.1)
Sodium: 139 mEq/L (ref 135–145)
Total Bilirubin: 0.5 mg/dL (ref 0.3–1.2)

## 2013-03-29 LAB — CBC WITH DIFFERENTIAL/PLATELET
Basophils Absolute: 0 10*3/uL (ref 0.0–0.1)
Eosinophils Absolute: 0.1 10*3/uL (ref 0.0–0.7)
Eosinophils Relative: 3 % (ref 0–5)
Lymphocytes Relative: 32 % (ref 12–46)
Lymphs Abs: 1.6 10*3/uL (ref 0.7–4.0)
MCH: 32.7 pg (ref 26.0–34.0)
MCV: 98.5 fL (ref 78.0–100.0)
Neutro Abs: 2.8 10*3/uL (ref 1.7–7.7)
Platelets: ADEQUATE 10*3/uL (ref 150–400)
RBC: 3.98 MIL/uL (ref 3.87–5.11)
RDW: 12.9 % (ref 11.5–15.5)
WBC: 5.2 10*3/uL (ref 4.0–10.5)

## 2013-03-29 MED ORDER — IOHEXOL 300 MG/ML  SOLN
100.0000 mL | Freq: Once | INTRAMUSCULAR | Status: AC | PRN
  Administered 2013-03-29: 100 mL via INTRAVENOUS

## 2013-03-29 NOTE — Progress Notes (Signed)
Labs drawn today for cbc/diff,cmp 

## 2013-03-31 ENCOUNTER — Ambulatory Visit (HOSPITAL_COMMUNITY): Payer: Medicare Other

## 2013-06-14 ENCOUNTER — Encounter (HOSPITAL_COMMUNITY): Payer: Medicare Other | Attending: Hematology and Oncology

## 2013-06-14 DIAGNOSIS — D696 Thrombocytopenia, unspecified: Secondary | ICD-10-CM | POA: Insufficient documentation

## 2013-06-14 DIAGNOSIS — K7460 Unspecified cirrhosis of liver: Secondary | ICD-10-CM | POA: Insufficient documentation

## 2013-06-14 DIAGNOSIS — D472 Monoclonal gammopathy: Secondary | ICD-10-CM | POA: Insufficient documentation

## 2013-06-14 DIAGNOSIS — D731 Hypersplenism: Secondary | ICD-10-CM | POA: Insufficient documentation

## 2013-06-14 LAB — CBC WITH DIFFERENTIAL/PLATELET
BASOS ABS: 0 10*3/uL (ref 0.0–0.1)
BASOS PCT: 0 % (ref 0–1)
Eosinophils Absolute: 0.1 10*3/uL (ref 0.0–0.7)
Eosinophils Relative: 1 % (ref 0–5)
HEMATOCRIT: 38.4 % (ref 36.0–46.0)
Hemoglobin: 13 g/dL (ref 12.0–15.0)
LYMPHS PCT: 22 % (ref 12–46)
Lymphs Abs: 1.1 10*3/uL (ref 0.7–4.0)
MCH: 33.2 pg (ref 26.0–34.0)
MCHC: 33.9 g/dL (ref 30.0–36.0)
MCV: 98 fL (ref 78.0–100.0)
MONO ABS: 0.6 10*3/uL (ref 0.1–1.0)
Monocytes Relative: 12 % (ref 3–12)
Neutro Abs: 3.3 10*3/uL (ref 1.7–7.7)
Neutrophils Relative %: 65 % (ref 43–77)
Platelets: ADEQUATE 10*3/uL (ref 150–400)
RBC: 3.92 MIL/uL (ref 3.87–5.11)
RDW: 12.7 % (ref 11.5–15.5)
WBC: 5.1 10*3/uL (ref 4.0–10.5)

## 2013-06-14 LAB — LACTATE DEHYDROGENASE: LDH: 174 U/L (ref 94–250)

## 2013-06-14 NOTE — Progress Notes (Signed)
Labs drawn today for ldh,kllc,cbc/diff,b25mic,IGG,IGM,IGA

## 2013-06-15 LAB — IGG, IGA, IGM
IGA: 460 mg/dL — AB (ref 69–380)
IGG (IMMUNOGLOBIN G), SERUM: 1500 mg/dL (ref 690–1700)
IgM, Serum: 267 mg/dL — ABNORMAL HIGH (ref 52–322)

## 2013-06-15 LAB — KAPPA/LAMBDA LIGHT CHAINS
Kappa free light chain: 3.14 mg/dL — ABNORMAL HIGH (ref 0.33–1.94)
Kappa, lambda light chain ratio: 0.14 — ABNORMAL LOW (ref 0.26–1.65)
Lambda free light chains: 22.2 mg/dL — ABNORMAL HIGH (ref 0.57–2.63)

## 2013-06-16 LAB — BETA 2 MICROGLOBULIN, SERUM: BETA 2 MICROGLOBULIN: 3.7 mg/L — AB (ref <=2.51)

## 2013-06-21 ENCOUNTER — Encounter (HOSPITAL_COMMUNITY): Payer: Self-pay

## 2013-06-21 ENCOUNTER — Encounter (HOSPITAL_BASED_OUTPATIENT_CLINIC_OR_DEPARTMENT_OTHER): Payer: Medicare Other

## 2013-06-21 VITALS — BP 116/68 | HR 68 | Temp 97.4°F | Resp 18 | Wt 229.4 lb

## 2013-06-21 DIAGNOSIS — K7460 Unspecified cirrhosis of liver: Secondary | ICD-10-CM

## 2013-06-21 DIAGNOSIS — D731 Hypersplenism: Secondary | ICD-10-CM

## 2013-06-21 DIAGNOSIS — D696 Thrombocytopenia, unspecified: Secondary | ICD-10-CM

## 2013-06-21 DIAGNOSIS — D472 Monoclonal gammopathy: Secondary | ICD-10-CM

## 2013-06-21 NOTE — Progress Notes (Signed)
[facility]  OFFICE PROGRESS NOTE  Melanie Kilts, MD [address] [place] [address]  DIAGNOSIS: IgM monoclonal gammopathy of uncertain significance - Plan: CBC with Differential, Lactate dehydrogenase, Kappa/lambda light chains, IgG, IgA, IgM, Beta 2 microglobuline, serum  Thrombocytopenia - Plan: CBC with Differential, Lactate dehydrogenase, Kappa/lambda light chains, IgG, IgA, IgM, Beta 2 microglobuline, serum  Cirrhosis - Plan: CBC with Differential, Lactate dehydrogenase, Kappa/lambda light chains, IgG, IgA, IgM, Beta 2 microglobuline, serum  Hypersplenism - Plan: CBC with Differential, Lactate dehydrogenase, Kappa/lambda light chains, IgG, IgA, IgM, Beta 2 microglobuline, serum  Chief Complaint  Patient presents with  . MGUS    CURRENT THERAPY: Watchful expectation and surveillance.  INTERVAL HISTORY: Melanie Porter 73 y.o. female returns for followup of MGUS in the setting of cirrhosis with splenomegaly and intra-abdominal lymphadenopathy. There appears to be  Elevation above IgA and IgM with latest M. the ratio 0.14. She continues to have discomfort in most of her joints but no bone pain. She has had fractures in the knee and ankle on the left side in for that reason has a chronic swelling of the ankle. She denies any significant hot flashes, fever, easy satiety, peripheral paresthesias, melena, hematochezia, hematuria but does bruise easily. She denies any epistaxis, melena, hematochezia, vaginal bleeding, hematuria, or incontinence. Melanie Porter denies any skin rash, headache, or seizures.  MEDICAL HISTORY: Past Medical History  Diagnosis Date  . Depression   . Anxiety   . Arthritis   . Obesity   . Hypercholesteremia   . Fibromyalgia   . GERD (gastroesophageal reflux disease)   . Back fracture 05/21/10  . Knee fracture, right 05/21/10  . Ankle fracture, right 05/21/10  . Thrombocytopenia  02/19/2012  . Nonalcoholic steatohepatitis (NASH) BMI > 40    NOV 2013 CHILD PUGH A, MELD SCORE 7  . Splenomegaly 09/23/2012  . IgM monoclonal gammopathy of uncertain significance 09/23/2012    INTERIM HISTORY: has Fibromyalgia; Hyperlipidemia; Arthritis; GERD (gastroesophageal reflux disease); Thrombocytopenia; Cirrhosis; Splenomegaly; IgM monoclonal gammopathy of uncertain significance; Hypersplenism; Platelet disorder(CLUMPING); and Dermatitis perineal on her problem list.    ALLERGIES:  has No Known Allergies.  MEDICATIONS: has a current medication list which includes the following prescription(s): bisacodyl, calcium carbonate-vit d-min, cyanocobalamin, docusate sodium, fluoxetine, hydrocodone-acetaminophen, loratadine, multiple vitamins-minerals, nystatin-triamcinolone ointment, omeprazole, propylene glycol, psyllium, simvastatin, trazodone, trolamine salicylate, and flaxseed oil.  SURGICAL HISTORY:  Past Surgical History  Procedure Laterality Date  . Breast biopsy  1982    left benign  . Partial hysterectomy    . Esophagogastroduodenoscopy  03/02/2012    Procedure: ESOPHAGOGASTRODUODENOSCOPY (EGD);  Surgeon: Danie Binder, MD;  Location: [facility];  Service: Endoscopy;  Laterality: N/A;  1:30    FAMILY HISTORY: family history includes Diabetes in her sister; Heart disease in her brother, father, and sister; Hypertension in her brother and sister. There is no history of Colon cancer.  SOCIAL HISTORY:  reports that she has never smoked. She has never used smokeless tobacco. She reports that she does not drink alcohol or use illicit drugs.  REVIEW OF SYSTEMS:  Other than that discussed above is noncontributory.  PHYSICAL EXAMINATION: ECOG PERFORMANCE STATUS: 2 - Symptomatic, <50% confined to bed  Blood pressure 116/68, pulse 68, temperature 97.4 F (36.3 C), temperature source Oral, resp. rate 18, weight 229 lb 6.4 oz (104.055 kg), SpO2 100.00%.  GENERAL:alert, no distress and  comfortable. Morbidly obese. SKIN:  skin color, texture, turgor are normal, no rashes or significant lesions EYES: PERLA; Conjunctiva are pink and non-injected, sclera clear SINUSES: No redness or tenderness over maxillary or ethmoid sinuses OROPHARYNX:no exudate, no erythema on lips, buccal mucosa, or tongue. NECK: supple, thyroid normal size, non-tender, without nodularity. No masses CHEST: Normal AP diameter with no breast masses. LYMPH:  no palpable lymphadenopathy in the cervical, axillary or inguinal LUNGS: clear to auscultation and percussion with normal breathing effort HEART: regular rate & rhythm and no murmurs. ABDOMEN:abdomen soft, non-tender and normal bowel sounds MUSCULOSKELETAL:no cyanosis of digits and no clubbing.  Decreased range of motion of the left knee and left ankle. Minimal left ankle swelling.  NEURO: alert & oriented x 3 with fluent speech, no focal motor/sensory deficits   LABORATORY DATA: Infusion on 06/14/2013  Component Date Value Ref Range Status  . IgG (Immunoglobin G), Serum 06/14/2013 1500  690 - 1700 mg/dL Final  . IgA 06/14/2013 460* 69 - 380 mg/dL Final  . IgM, Serum 06/14/2013 267* 52 - 322 mg/dL Final   Performed at [facility]  . Beta-2 Microglobulin 06/14/2013 3.70* <=2.51 mg/L Final   Performed at [facility]  . LDH 06/14/2013 174  94 - 250 U/L Final  . Kappa free light chain 06/14/2013 3.14* 0.33 - 1.94 mg/dL Final  . Lamda free light chains 06/14/2013 22.20* 0.57 - 2.63 mg/dL Final  . Kappa, lamda light chain ratio 06/14/2013 0.14* 0.26 - 1.65 Final   Performed at [facility]  . WBC 06/14/2013 5.1  4.0 - 10.5 K/uL Final  . RBC 06/14/2013 3.92  3.87 - 5.11 MIL/uL Final  . Hemoglobin 06/14/2013 13.0  12.0 - 15.0 g/dL Final  . HCT 06/14/2013 38.4  36.0 - 46.0 % Final  . MCV 06/14/2013 98.0  78.0 - 100.0 fL Final  . MCH 06/14/2013 33.2  26.0 - 34.0 pg Final  . MCHC 06/14/2013 33.9  30.0 - 36.0 g/dL Final  . RDW  06/14/2013 12.7  11.5 - 15.5 % Final  . Platelets 06/14/2013 PLATELET CLUMPS NOTED ON SMEAR, COUNT APPEARS ADEQUATE  150 - 400 K/uL Final  . Neutrophils Relative % 06/14/2013 65  43 - 77 % Final  . Neutro Abs 06/14/2013 3.3  1.7 - 7.7 K/uL Final  . Lymphocytes Relative 06/14/2013 22  12 - 46 % Final  . Lymphs Abs 06/14/2013 1.1  0.7 - 4.0 K/uL Final  . Monocytes Relative 06/14/2013 12  3 - 12 % Final  . Monocytes Absolute 06/14/2013 0.6  0.1 - 1.0 K/uL Final  . Eosinophils Relative 06/14/2013 1  0 - 5 % Final  . Eosinophils Absolute 06/14/2013 0.1  0.0 - 0.7 K/uL Final  . Basophils Relative 06/14/2013 0  0 - 1 % Final  . Basophils Absolute 06/14/2013 0.0  0.0 - 0.1 K/uL Final    PATHOLOGY: No new pathology. Peripheral smear failed to reveal evidence of rouleaux formation.  Urinalysis No results found for this basename: colorurine,  appearanceur,  labspec,  phurine,  glucoseu,  hgbur,  bilirubinur,  ketonesur,  proteinur,  urobilinogen,  nitrite,  leukocytesur    ASSESSMENT: #1. Cirrhosis with hypersplenism, stable.  #2. IgM monoclonal gammopathy of uncertain significance, stable, now in IgA level slightly elevated as well. #3. Fibromyalgia, on treatment.  #4. Perineal dermatitis  #5. Uncontrolled blood sugar   PLAN:  #1. Patient wishes to be followed once a year with which I have no objection. #2. Followup in one year with CBC,  chem profile, myeloma evaluation, serum immunofixation, light chain analysis.   All questions were answered. The patient knows to call the clinic with any problems, questions or concerns. We can certainly see the patient much sooner if necessary.   I spent 25 minutes counseling the patient face to face. The total time spent in the appointment was 30 minutes.    Doroteo Bradford, MD 06/21/2013 12:13 PM

## 2013-06-21 NOTE — Patient Instructions (Signed)
[facility] Discharge Instructions  RECOMMENDATIONS MADE BY THE CONSULTANT AND ANY TEST RESULTS WILL BE SENT TO YOUR REFERRING PHYSICIAN. We will see you in 1 year. We will do repeat labs in 1 year prior to your visit. Please call for any questions for concerns.  Thank you for choosing [facility] to provide your oncology and hematology care.  To afford each patient quality time with our providers, please arrive at least 15 minutes before your scheduled appointment time.  With your help, our goal is to use those 15 minutes to complete the necessary work-up to ensure our physicians have the information they need to help with your evaluation and healthcare recommendations.    Effective January 1st, 2014, we ask that you re-schedule your appointment with our physicians should you arrive 10 or more minutes late for your appointment.  We strive to give you quality time with our providers, and arriving late affects you and other patients whose appointments are after yours.    Again, thank you for choosing [facility].  Our hope is that these requests will decrease the amount of time that you wait before being seen by our physicians.       _____________________________________________________________  Should you have questions after your visit to [facility], please contact our office at [phone] between the hours of 8:30 a.m. and 5:00 p.m.  Voicemails left after 4:30 p.m. will not be returned until the following business day.  For prescription refill requests, have your pharmacy contact our office with your prescription refill request.

## 2013-07-14 ENCOUNTER — Other Ambulatory Visit (HOSPITAL_COMMUNITY): Payer: Self-pay | Admitting: Family Medicine

## 2013-07-14 DIAGNOSIS — R0989 Other specified symptoms and signs involving the circulatory and respiratory systems: Secondary | ICD-10-CM

## 2013-07-15 ENCOUNTER — Ambulatory Visit (HOSPITAL_COMMUNITY)
Admission: RE | Admit: 2013-07-15 | Discharge: 2013-07-15 | Disposition: A | Discharge status: Home / Self Care | Payer: Medicare Other | Source: Ambulatory Visit | Attending: Family Medicine | Admitting: Family Medicine

## 2013-07-15 DIAGNOSIS — I7090 Unspecified atherosclerosis: Secondary | ICD-10-CM | POA: Insufficient documentation

## 2013-07-15 DIAGNOSIS — R0989 Other specified symptoms and signs involving the circulatory and respiratory systems: Secondary | ICD-10-CM

## 2013-07-15 DIAGNOSIS — I6529 Occlusion and stenosis of unspecified carotid artery: Secondary | ICD-10-CM | POA: Insufficient documentation

## 2013-07-15 DIAGNOSIS — I658 Occlusion and stenosis of other precerebral arteries: Secondary | ICD-10-CM | POA: Insufficient documentation

## 2013-07-20 ENCOUNTER — Ambulatory Visit (INDEPENDENT_AMBULATORY_CARE_PROVIDER_SITE_OTHER): Payer: Medicare Other | Admitting: Gynecology

## 2013-07-20 ENCOUNTER — Telehealth: Payer: Self-pay | Admitting: *Deleted

## 2013-07-20 ENCOUNTER — Encounter: Payer: Self-pay | Admitting: Gynecology

## 2013-07-20 VITALS — BP 120/74 | Ht 60.5 in | Wt 231.0 lb

## 2013-07-20 DIAGNOSIS — M949 Disorder of cartilage, unspecified: Secondary | ICD-10-CM

## 2013-07-20 DIAGNOSIS — N63 Unspecified lump in unspecified breast: Secondary | ICD-10-CM | POA: Insufficient documentation

## 2013-07-20 DIAGNOSIS — M899 Disorder of bone, unspecified: Secondary | ICD-10-CM

## 2013-07-20 DIAGNOSIS — E663 Overweight: Secondary | ICD-10-CM | POA: Insufficient documentation

## 2013-07-20 DIAGNOSIS — M8580 Other specified disorders of bone density and structure, unspecified site: Secondary | ICD-10-CM

## 2013-07-20 DIAGNOSIS — N8111 Cystocele, midline: Secondary | ICD-10-CM

## 2013-07-20 DIAGNOSIS — N952 Postmenopausal atrophic vaginitis: Secondary | ICD-10-CM | POA: Insufficient documentation

## 2013-07-20 NOTE — Telephone Encounter (Signed)
Message copied by Thamas Jaegers on Tue Jul 20, 2013 10:33 AM ------      Message from: Terrance Mass      Created: Tue Jul 20, 2013  9:59 AM       Please schedule a diagnostic mammogram for this patient's at [facility] where she has had a mammogram before.            Patient has a right breast solid mass measuring 1-1/2 cm nontender 4 fingerbreadths from the areolar region at the 7:00 position. No supraclavicular or axillary lymphadenopathy. Contralateral breast no palpable masses or tenderness or supraclavicular or axillary lymphadenopathy. ------

## 2013-07-20 NOTE — Telephone Encounter (Signed)
Appointment on 07/21/13 @ 2:45 pm at [facility], order faxed left message for pt to call.

## 2013-07-20 NOTE — Progress Notes (Signed)
Melanie Porter [birth year] [id number]   History:    73 y.o. with complaints of a right breast lump that she palpated in February but is nontender and has not seek any medical attention. Several years ago she had a left breast biopsy which was reported to be benign. Patient is being followed by her PCP Dr. Hilma Favors who has been doing her blood work. 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   Patient has history of osteopenia her last bone density study in 2014 demonstrating her lowest T score of -1.5 at the distal one third left forearm her left hip score T. score was -1.1 and she had a normal FRAX analysis. She did have a traumatic fall in 2012 resulting in a back and right ankle fracture. She is taking her calcium and vitamin D. Patient has had a history of transvaginal hysterectomy in the past. Patient denied any prior history of abnormal Pap smear.   Colonoscopy 2009 reported to be normal  Past medical history,surgical history, family history and social history were all reviewed and documented in the EPIC chart.  Gynecologic History No LMP recorded. Patient is postmenopausal. Contraception: status post hysterectomy Last Pap: 2012. Results were: normal Last mammogram: 2014. Results were: normal  Obstetric History OB History  Gravida Para Term Preterm AB SAB TAB Ectopic Multiple Living  2 2 2       2     # Outcome Date GA Lbr Len/2nd Weight Sex Delivery Anes PTL Lv  2 TRM     F SVD  N Y  1 TRM     M SVD  N Y       ROS: A ROS was performed and pertinent positives and negatives are included in the history.  GENERAL: No fevers or chills. HEENT: No change in vision, no earache, sore throat or sinus congestion. NECK: No pain or stiffness. CARDIOVASCULAR: No chest pain or pressure. No palpitations. PULMONARY: No shortness of breath, cough or wheeze. GASTROINTESTINAL: No abdominal pain, nausea,  vomiting or diarrhea, melena or bright red blood per rectum. GENITOURINARY: No urinary frequency, urgency, hesitancy or dysuria. MUSCULOSKELETAL: No joint or muscle pain, no back pain, no recent trauma. DERMATOLOGIC: No rash, no itching, no lesions. ENDOCRINE: No polyuria, polydipsia, no heat or cold intolerance. No recent change in weight. HEMATOLOGICAL: No anemia or easy bruising or bleeding. NEUROLOGIC: No headache, seizures, numbness, tingling or weakness. PSYCHIATRIC: No depression, no loss of interest in normal activity or change in sleep pattern.    Complaining of a right breast mass  Exam: chaperone present  BP 120/74  Ht 5' 0.5" (1.537 m)  Wt 231 lb (104.781 kg)  BMI 44.35 kg/m2  Body mass index is 44.35 kg/(m^2).  General appearance : Well developed well nourished female. No acute distress HEENT: Neck supple, trachea midline, no carotid bruits, no thyroidmegaly Lungs: Clear to auscultation, no rhonchi or wheezes, or rib retractions  Heart: Regular rate and rhythm, no murmurs or gallops Breast:Examined in sitting and supine position were symmetrical in appearance, Patient has a right breast solid mass measuring 1-1/2 cm nontender 4 fingerbreadths from the areolar region at the 7:00 position. No supraclavicular or axillary lymphadenopathy. Contralateral breast no palpable masses or tenderness or supraclavicular or axillary lymphadenopathy. Abdomen: no palpable masses or tenderness, no rebound or guarding Extremities: no edema or skin discoloration or tenderness  Pelvic:  Bartholin, Urethra, Skene Glands:  Within normal limits             Vagina: No gross lesions or discharge  Cervix:  absent  Uterus:  absent  Adnexa  Without masses or tenderness  Anus and perineum  normal   Rectovaginal  normal sphincter tone without palpated masses or tenderness             Hemoccult PCP provides      Assessment/Plan:  73 y.o. female with  a right breast solid mass measuring 1-1/2 cm  nontender 4 fingerbreadths from the areolar region at the 7:00 position. No supraclavicular or axillary lymphadenopathy. Contralateral breast no palpable masses or tenderness or supraclavicular or axillary lymphadenopathy. Patient will be scheduled for a diagnostic mammogram.PCP will be drawn her blood work. Pap smear not done in accordance to the new guidelines. She will need a bone density study next year. She was monitored on the importance of calcium and vitamin D and regular exercise for osteoporosis prevention.  Note: This dictation was prepared with  Dragon/digital dictation along withSmart phrase technology. Any transcriptional errors that result from this process are unintentional.   Terrance Mass MD, 10:00 AM 07/20/2013

## 2013-07-21 NOTE — Telephone Encounter (Signed)
Pt informed

## 2013-07-22 ENCOUNTER — Encounter: Payer: Self-pay | Admitting: Gynecology

## 2013-10-11 ENCOUNTER — Other Ambulatory Visit: Payer: Self-pay | Admitting: Gastroenterology

## 2013-10-11 ENCOUNTER — Encounter: Payer: Self-pay | Admitting: Gastroenterology

## 2013-10-11 DIAGNOSIS — K7460 Unspecified cirrhosis of liver: Secondary | ICD-10-CM

## 2013-11-01 ENCOUNTER — Ambulatory Visit (HOSPITAL_COMMUNITY)
Admission: RE | Admit: 2013-11-01 | Discharge: 2013-11-01 | Disposition: A | Discharge status: Home / Self Care | Payer: Medicare Other | Source: Ambulatory Visit | Attending: Gastroenterology | Admitting: Gastroenterology

## 2013-11-01 DIAGNOSIS — K7460 Unspecified cirrhosis of liver: Secondary | ICD-10-CM | POA: Diagnosis present

## 2013-11-02 NOTE — Progress Notes (Signed)
Quick Note:  Cirrhosis noted. No HCC. Repeat in 6 months. Needs routine, non-urgent OV with [place]. ______

## 2013-11-04 NOTE — Progress Notes (Signed)
Quick Note:  Called and informed pt. Routing to [place] to schedule appt and nic the [place] in 6 months. ______

## 2013-11-18 ENCOUNTER — Other Ambulatory Visit: Payer: Self-pay | Admitting: Gastroenterology

## 2013-11-18 ENCOUNTER — Other Ambulatory Visit: Payer: Self-pay

## 2013-11-18 ENCOUNTER — Ambulatory Visit (INDEPENDENT_AMBULATORY_CARE_PROVIDER_SITE_OTHER): Payer: Medicare Other | Admitting: Gastroenterology

## 2013-11-18 ENCOUNTER — Encounter: Payer: Self-pay | Admitting: Gastroenterology

## 2013-11-18 VITALS — BP 125/67 | HR 76 | Temp 97.9°F | Ht 62.0 in | Wt 229.2 lb

## 2013-11-18 DIAGNOSIS — R011 Cardiac murmur, unspecified: Secondary | ICD-10-CM

## 2013-11-18 DIAGNOSIS — K7460 Unspecified cirrhosis of liver: Secondary | ICD-10-CM

## 2013-11-18 DIAGNOSIS — K7469 Other cirrhosis of liver: Secondary | ICD-10-CM

## 2013-11-18 DIAGNOSIS — K219 Gastro-esophageal reflux disease without esophagitis: Secondary | ICD-10-CM

## 2013-11-18 DIAGNOSIS — K279 Peptic ulcer, site unspecified, unspecified as acute or chronic, without hemorrhage or perforation: Secondary | ICD-10-CM | POA: Insufficient documentation

## 2013-11-18 MED ORDER — PANTOPRAZOLE SODIUM 40 MG PO TBEC: 40.0000 mg | DELAYED_RELEASE_TABLET | Freq: Every day | ORAL | Status: DC

## 2013-11-18 NOTE — Assessment & Plan Note (Signed)
Notable systolic murmur on exam. Patient states she may have been told she had a murmur but no prior work-up. ?aortic stenosis. Proceed with echocardiogram now. Will then refer to cardiology likely. I would like to have her evaluated by cardiology prior to elective EGD. Query intermittent dizziness secondary to cardiac etiology. Will need further evaluation by PCP and cardiology.

## 2013-11-18 NOTE — Assessment & Plan Note (Signed)
Change from Prilosec to Protonix. EGD for PUD surveillance.

## 2013-11-18 NOTE — Assessment & Plan Note (Signed)
73 year old female with PUD in 2013, overdue for surveillance EGD. Lost to follow-up. 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 No varices on last EGD. Continued dyspepsia. Needs EGD at this point for evaluation. Change Prilosec to Protonix once daily.

## 2013-11-18 NOTE — Assessment & Plan Note (Addendum)
Likely secondary to NASH. Hep A and B vaccinations completed per patient. Next [place] due in Feb 2015. EGD now for variceal surveillance. Mild to moderate lower extremity edema. No concern for abdominal ascites. Consider low-dose diuretics after retrieving outside labs.

## 2013-11-18 NOTE — Patient Instructions (Signed)
I have set you up for an echocardiogram of your heart. We will likely be referring you to cardiology for further evaluation. I would like to get this done before pursuing the upper endoscopy.  You will need an upper endoscopy in the near future, once you see cardiology.  I have requested blood work from your primary care doctor. I may put you on some low dose diuretics after review of this.   Stop Omeprazole. You may start Protonix once daily, 30 minutes before breakfast.

## 2013-11-18 NOTE — Progress Notes (Signed)
Referring Provider: Sharilyn Sites, MD Primary Care Physician:  [name], MD Primary GI: Dr. Oneida Alar   Chief Complaint  Patient presents with  . Follow-up    HPI:   Returns today in follow-up with NASH cirrhosis. EGD in Dec 2013 with multiple non-bleeding ulcers, ranging between 3-7 mm in size. Was due for 3 month surveillance but did not complete for unknown reasons. Lost to follow-up. Colonoscopy at outside facility; records requested. Recent US of abdomen with cirrhotic liver, no HCC.   Wakes up in the morning with mouth wide open. Feels light-headed at times. States putting groceries away, laying down, bending head back causes dizziness. Denies any esophageal dysphagia. Feels like someone has poured syrup down her esophagus in the morning. Notes esophagus burns "all the time". Any time she throws her head back, feels like she is going to pass out. Taking Prilosec 20 mg once daily. Not helping with GERD. Uses dietary measures to assist with constipation; makes a smoothie with bananas, peaches, strawberries, pears, metamucil in the morning. No melena. No NSAIDs.   Notes bilateral ankle edema.   Past Medical History  Diagnosis Date  . Depression   . Anxiety   . Arthritis   . Obesity   . Hypercholesteremia   . Fibromyalgia   . GERD (gastroesophageal reflux disease)   . Back fracture 05/21/10  . Knee fracture, right 05/21/10  . Ankle fracture, right 05/21/10  . Thrombocytopenia 02/19/2012  . 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 Splenomegaly 09/23/2012  . 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 Breast biopsy  1982    left benign  . Partial hysterectomy    . Esophagogastroduodenoscopy  03/02/2012    Dr. Oneida Alar:The mucosa of the esophagus appeared normal Multiple  non-bleeding ulcers, ranging between 3-72mm in size were found in the gastric antrum  The duodenal mucosa showed no abnormalities in the bulb and second portion of the duodenum. path negative for H.pylori    Current Outpatient Prescriptions  Medication Sig Dispense Refill  . bisacodyl (DULCOLAX) 5 MG EC tablet Take 10 mg by mouth daily as needed. Constipation      . Calcium Carbonate-Vit D-Min (CALTRATE PLUS PO) Take 1 tablet by mouth daily.       . Cyanocobalamin (VITAMIN B 12 PO) Take 2,000 mcg by mouth daily.      Marland Kitchen docusate sodium (COLACE) 100 MG capsule Take 100 mg by mouth at bedtime. Takes 2 at bedtime      . Flaxseed, Linseed, (FLAXSEED OIL) 1000 MG CAPS Take 1,000 mg by mouth 2 (two) times daily.       Marland Kitchen FLUoxetine (PROZAC) 40 MG capsule Take 40 mg by mouth daily.        Marland Kitchen HYDROcodone-acetaminophen (NORCO/VICODIN) 5-325 MG per tablet Take 1 tablet by mouth every 6 (six) hours as needed. Pain      . loratadine (CLARITIN) 10 MG tablet Take 10 mg by mouth as needed for allergies.      . Multiple Vitamins-Minerals (MULTIVITAMIN GUMMIES ADULT PO) Take 2 each by mouth daily. Vitafusion Women's complete multivitamin gummy      . omeprazole (PRILOSEC) 20 MG capsule Take 20 mg by mouth daily.        Marland Kitchen Propylene Glycol (SYSTANE BALANCE)  0.6 % SOLN Apply 1 drop to eye daily as needed. Dry Eyes      . psyllium (METAMUCIL) 58.6 % powder Take 1 packet by mouth as needed.      . simvastatin (ZOCOR) 40 MG tablet Take 40 mg by mouth every evening.      . traZODone (DESYREL) 100 MG tablet Take 100 mg by mouth at bedtime. Takes 1/2 tablet at bedtime.      . trolamine salicylate (ASPERCREME) 10 % cream Apply topically as needed.      . pantoprazole (PROTONIX) 40 MG tablet Take 1 tablet (40 mg total) by mouth daily. Take 30 minutes prior to breakfast.  30 tablet  5   No current facility-administered medications for this visit.    Allergies as of 11/18/2013  . (No Known Allergies)    Family History    Problem Relation Age of Onset  . Heart disease Father   . Diabetes Sister   . Hypertension Sister   . Heart disease Sister   . Hypertension Brother   . Heart disease Brother   . Colon cancer Neg Hx     History   Social History  . Marital Status: Married    Spouse Name: N/A    Number of Children: N/A  . Years of Education: N/A   Occupational History  . 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 Smoking status: Never Smoker   . Smokeless tobacco: Never Used  . Alcohol Use: No  . Drug Use: No  . Sexual Activity: No   Other Topics Concern  . None   Social History Narrative  . None    Review of Systems: As mentioned in HPI.   Physical Exam: BP 125/67  Pulse 76  Temp(Src) 97.9 F (36.6 C) (Oral)  Ht 5\' 2"  (1.575 m)  Wt 229 lb 3.2 oz (103.964 kg)  BMI 41.91 kg/m2 General:   Alert and oriented. No distress noted. Pleasant and cooperative.  Head:  Normocephalic and atraumatic. Eyes:  Conjuctiva clear without scleral icterus. Mouth:  Oral mucosa pink and moist.  Heart:  S1, S2 present with 3/6 systolic murmur. No documentation of murmurs in PMH.  Abdomen:  +BS, soft, mild TTP epigastric region and non-distended. No rebound or guarding. No HSM or masses noted. Msk:  Symmetrical without gross deformities. Normal posture. Extremities:  1+ pedal/ankle edema Neurologic:  Alert and  oriented x4;  grossly normal neurologically. Skin:  Intact without significant lesions or rashes Psych:  Alert and cooperative. Normal mood and affect.

## 2013-11-18 NOTE — Progress Notes (Signed)
LMOM for a return call. Lab orders faxed to [facility].

## 2013-11-18 NOTE — Progress Notes (Signed)
Received outside labs dated June 2015.   Alk Phos 146, AST 44, ALT 39. No BMP performed.   Needs BMP and CBC prior to initiating any diuretic therapy.

## 2013-11-19 ENCOUNTER — Ambulatory Visit (HOSPITAL_COMMUNITY)
Admission: RE | Admit: 2013-11-19 | Discharge: 2013-11-19 | Disposition: A | Discharge status: Home / Self Care | Payer: Medicare Other | Source: Ambulatory Visit | Attending: Gastroenterology | Admitting: Gastroenterology

## 2013-11-19 DIAGNOSIS — R011 Cardiac murmur, unspecified: Secondary | ICD-10-CM | POA: Insufficient documentation

## 2013-11-19 DIAGNOSIS — I359 Nonrheumatic aortic valve disorder, unspecified: Secondary | ICD-10-CM

## 2013-11-19 NOTE — Progress Notes (Signed)
  Echocardiogram 2D Echocardiogram has been performed.  [name], [place] 11/19/2013, 11:01 AM

## 2013-11-22 NOTE — Progress Notes (Signed)
cc'd to pcp 

## 2013-11-23 NOTE — Progress Notes (Signed)
Pt is aware and will go today or tomorrow for labs.

## 2013-11-24 LAB — CBC WITH DIFFERENTIAL/PLATELET
BASOS PCT: 0 % (ref 0–1)
Basophils Absolute: 0 10*3/uL (ref 0.0–0.1)
EOS ABS: 0.1 10*3/uL (ref 0.0–0.7)
Eosinophils Relative: 2 % (ref 0–5)
HEMATOCRIT: 36.9 % (ref 36.0–46.0)
HEMOGLOBIN: 12.2 g/dL (ref 12.0–15.0)
Lymphocytes Relative: 22 % (ref 12–46)
Lymphs Abs: 1.1 10*3/uL (ref 0.7–4.0)
MCH: 32.7 pg (ref 26.0–34.0)
MCHC: 33.1 g/dL (ref 30.0–36.0)
MCV: 98.9 fL (ref 78.0–100.0)
MONO ABS: 0.5 10*3/uL (ref 0.1–1.0)
MONOS PCT: 11 % (ref 3–12)
Neutro Abs: 3.1 10*3/uL (ref 1.7–7.7)
Neutrophils Relative %: 65 % (ref 43–77)
Platelets: 60 10*3/uL — ABNORMAL LOW (ref 150–400)
RBC: 3.73 MIL/uL — ABNORMAL LOW (ref 3.87–5.11)
RDW: 13.4 % (ref 11.5–15.5)
WBC: 4.8 10*3/uL (ref 4.0–10.5)

## 2013-11-24 LAB — COMPREHENSIVE METABOLIC PANEL
ALK PHOS: 112 U/L (ref 39–117)
ALT: 27 U/L (ref 0–35)
AST: 33 U/L (ref 0–37)
Albumin: 3.5 g/dL (ref 3.5–5.2)
BILIRUBIN TOTAL: 0.6 mg/dL (ref 0.2–1.2)
BUN: 13 mg/dL (ref 6–23)
CO2: 28 meq/L (ref 19–32)
Calcium: 8.6 mg/dL (ref 8.4–10.5)
Chloride: 106 mEq/L (ref 96–112)
Creat: 0.66 mg/dL (ref 0.50–1.10)
GLUCOSE: 99 mg/dL (ref 70–99)
Potassium: 4.1 mEq/L (ref 3.5–5.3)
Sodium: 140 mEq/L (ref 135–145)
Total Protein: 6.8 g/dL (ref 6.0–8.3)

## 2013-11-24 NOTE — Progress Notes (Signed)
From ECHO: EF 60-65% with grade 1 diastolic dysfunction. Mild mitral regurgitation. Mild to moderate calcific aortic stenosis. Mild tricuspid regurgitation.   I ordered this due to murmur noted on exam. Let's have her establish care with cardiology. Reporting intermittent dizziness but no syncopal episodes. Some lower extremity mild edema but without concern for abdominal ascites. May benefit from low-dose diuretic therapy but I would like to have cardiology input first. For now follow a 2 gram Na diet.   Please have her see cardiology in near future, then we can proceed with EGD with Dr. Gala Romney for follow-up of PUD and variceal surveillance.

## 2013-11-25 ENCOUNTER — Other Ambulatory Visit: Payer: Self-pay | Admitting: Gastroenterology

## 2013-11-25 DIAGNOSIS — I340 Nonrheumatic mitral (valve) insufficiency: Secondary | ICD-10-CM

## 2013-11-25 NOTE — Progress Notes (Signed)
Referral has been made to [facility] in [place]

## 2013-12-02 NOTE — Progress Notes (Signed)
Quick Note:  CBC and CMP reviewed. 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 LFTs normal. ______

## 2013-12-09 ENCOUNTER — Telehealth: Payer: Self-pay

## 2013-12-09 NOTE — Telephone Encounter (Signed)
Returned patients call.

## 2013-12-09 NOTE — Telephone Encounter (Signed)
Pt left VM that she was returning someone's call in reference to MRI. Routing to [place].

## 2013-12-14 LAB — HEPATIC FUNCTION PANEL
ALT: 39
AST: 44 U/L
Albumin: 3.6
Alkaline Phosphatase: 146 U/L
BILIRUBIN DIRECT: 0.1 mg/dL (ref 0.01–0.4)
BILIRUBIN INDIRECT: 0.5
BILIRUBIN TOTAL: 0.6 mg/dL
Total Protein: 7.2 g/dL

## 2013-12-28 ENCOUNTER — Ambulatory Visit (INDEPENDENT_AMBULATORY_CARE_PROVIDER_SITE_OTHER): Payer: Medicare Other | Admitting: Cardiology

## 2013-12-28 ENCOUNTER — Encounter: Payer: Self-pay | Admitting: Cardiology

## 2013-12-28 VITALS — BP 108/78 | HR 78 | Ht 62.0 in | Wt 231.0 lb

## 2013-12-28 DIAGNOSIS — R609 Edema, unspecified: Secondary | ICD-10-CM

## 2013-12-28 DIAGNOSIS — R0989 Other specified symptoms and signs involving the circulatory and respiratory systems: Secondary | ICD-10-CM

## 2013-12-28 DIAGNOSIS — I359 Nonrheumatic aortic valve disorder, unspecified: Secondary | ICD-10-CM

## 2013-12-28 DIAGNOSIS — I059 Rheumatic mitral valve disease, unspecified: Secondary | ICD-10-CM

## 2013-12-28 DIAGNOSIS — R600 Localized edema: Secondary | ICD-10-CM

## 2013-12-28 DIAGNOSIS — I350 Nonrheumatic aortic (valve) stenosis: Secondary | ICD-10-CM

## 2013-12-28 MED ORDER — FUROSEMIDE 20 MG PO TABS: 20.0000 mg | ORAL_TABLET | Freq: Every day | ORAL | Status: DC

## 2013-12-28 NOTE — Patient Instructions (Signed)
Your physician wants you to follow-up in: 1 year You will receive a reminder letter in the mail two months in advance. If you don't receive a letter, please call our office to schedule the follow-up appointment.      Take lasix 20 mg daily as needed for swelling    Thank you for choosing [facility] !

## 2013-12-29 NOTE — Progress Notes (Signed)
Clinical Summary Ms. Redford is a 73 y.o.female seen today as a new patient for heart murmur  1. Heart murmur - noted by patient's gastroenterologist, echo ordered that showed mild AS (AVA 1.56, mean grad14) - she denies any significant DOE, chest pain, lightheadedness or syncope. Can have some occasional LE edema.  2. NASH cirrhosis - followed by GI   Past Medical History  Diagnosis Date  . Depression   . Anxiety   . Arthritis   . Obesity   . Hypercholesteremia   . Fibromyalgia   . GERD (gastroesophageal reflux disease)   . Back fracture 05/21/10  . Knee fracture, right 05/21/10  . Ankle fracture, right 05/21/10  . Thrombocytopenia 02/19/2012  . 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 Splenomegaly 09/23/2012  . 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 bisacodyl (DULCOLAX) 5 MG EC tablet Take 10 mg by mouth daily as needed. Constipation      . Calcium Carbonate-Vit D-Min (CALTRATE PLUS PO) Take 1 tablet by mouth daily.       . Cyanocobalamin (VITAMIN B 12 PO) Take 2,000 mcg by mouth daily.      Marland Kitchen docusate sodium (COLACE) 100 MG capsule Take 100 mg by mouth at bedtime. Takes 2 at bedtime      . Flaxseed, Linseed, (FLAXSEED OIL) 1000 MG CAPS Take 1,000 mg by mouth 2 (two) times daily.       Marland Kitchen FLUoxetine (PROZAC) 40 MG capsule Take 40 mg by mouth daily.        Marland Kitchen HYDROcodone-acetaminophen (NORCO/VICODIN) 5-325 MG per tablet Take 1 tablet by mouth every 6 (six) hours as needed. Pain      . loratadine (CLARITIN) 10 MG tablet Take 10 mg by mouth as needed for allergies.      . Multiple Vitamins-Minerals (MULTIVITAMIN GUMMIES ADULT PO) Take 2 each by mouth daily. Vitafusion Women's complete multivitamin gummy      . omeprazole (PRILOSEC) 20  MG capsule Take 20 mg by mouth daily.        . pantoprazole (PROTONIX) 40 MG tablet Take 1 tablet (40 mg total) by mouth daily. Take 30 minutes prior to breakfast.  30 tablet  5  . Propylene Glycol (SYSTANE BALANCE) 0.6 % SOLN Apply 1 drop to eye daily as needed. Dry Eyes      . psyllium (METAMUCIL) 58.6 % powder Take 1 packet by mouth as needed.      . simvastatin (ZOCOR) 40 MG tablet Take 40 mg by mouth every evening.      . traZODone (DESYREL) 100 MG tablet Take 100 mg by mouth at bedtime. Takes 1/2 tablet at bedtime.      . trolamine salicylate (ASPERCREME) 10 % cream Apply topically as needed.      . furosemide (LASIX) 20 MG tablet Take 1 tablet (20 mg total) by mouth daily. Take prn as needed for swelling  90 tablet  3   No current facility-administered medications for this visit.     Past Surgical History  Procedure Laterality Date  . Breast biopsy  1982    left benign  . Partial hysterectomy    . Esophagogastroduodenoscopy  03/02/2012    Dr. Oneida Alar:The mucosa of the esophagus  appeared normal Multiple non-bleeding ulcers, ranging between 3-3mm in size were found in the gastric antrum  The duodenal mucosa showed no abnormalities in the bulb and second portion of the duodenum. path negative for H.pylori     No Known Allergies    Family History  Problem Relation Age of Onset  . Heart disease Father   . Diabetes Sister   . Hypertension Sister   . Heart disease Sister   . Hypertension Brother   . Heart disease Brother   . Colon cancer Neg Hx      Social History Ms. Grabe reports that she has never smoked. She has never used smokeless tobacco. Ms. Kenealy reports that she does not drink alcohol.   Review of Systems CONSTITUTIONAL: No weight loss, fever, chills, weakness or fatigue.  HEENT: Eyes: No visual loss, blurred vision, double vision or yellow sclerae.No hearing loss, sneezing, congestion, runny nose or sore throat.  SKIN: No rash or itching.  CARDIOVASCULAR: per  HPI RESPIRATORY: No shortness of breath, cough or sputum.  GASTROINTESTINAL: No anorexia, nausea, vomiting or diarrhea. No abdominal pain or blood.  GENITOURINARY: No burning on urination, no polyuria NEUROLOGICAL: No headache, dizziness, syncope, paralysis, ataxia, numbness or tingling in the extremities. No change in bowel or bladder control.  MUSCULOSKELETAL: No muscle, back pain, joint pain or stiffness.  LYMPHATICS: No enlarged nodes. No history of splenectomy.  PSYCHIATRIC: No history of depression or anxiety.  ENDOCRINOLOGIC: No reports of sweating, cold or heat intolerance. No polyuria or polydipsia.  Marland Kitchen   Physical Examination Filed Vitals:   12/28/13 0846  BP: 108/78  Pulse: 78   Filed Weights   12/28/13 0846  Weight: 231 lb (104.781 kg)    Gen: resting comfortably, no acute distress HEENT: no scleral icterus, pupils equal round and reactive, no palptable cervical adenopathy,  CV: RRR, 2/6 systolic murmur RUSB, no JVD. + bilateral carotid bruits Resp: Clear to auscultation bilaterally GI: abdomen is soft, non-tender, non-distended, normal bowel sounds, no hepatosplenomegaly MSK: extremities are warm, 1+ bilateral LE edema  Skin: warm, no rash Neuro:  no focal deficits Psych: appropriate affect   Diagnostic Studies 11/19/13 echo Left ventricle: Systolic function was normal. The estimated ejection fraction was in the range of 60% to 65%. Wall motion was normal; there were no regional wall motion abnormalities. Doppler parameters are consistent with abnormal left ventricular relaxation (grade 1 diastolic dysfunction). - Aortic valve: Mildly calcified annulus. Trileaflet; moderately calcified leaflets. Cusp separation was reduced. There was mild to moderate stenosis. Mean gradient (S): 14 mm Hg. Peak gradient (S): 30 mm Hg. VTI ratio of LVOT to aortic valve: 0.39. Valve area (VTI): 1.56 cm^2. Valve area (Vmax): 1.43 cm^2. - Mitral valve: Calcified annulus. There was  mild regurgitation. - Right atrium: Central venous pressure (est): 3 mm Hg. - Tricuspid valve: There was mild regurgitation. - Pulmonary arteries: PA peak pressure: 37 mm Hg (S). - Pericardium, extracardiac: There was no pericardial effusion.  Impressions:  - Normal LV wall thickness and chamber size with LVEF 65-03%, grade 1 diastolic dysfunction. MAC with mild mitral regurgitation. Mild to moderate calcific aortic stenosis as outlined above. Mild tricuspid regurgitation with PASP 37 mmHg.  06/2013 Carotid US IMPRESSION:  Minimal atherosclerotic disease in the carotid arteries. No  significant stenosis. Estimated degree of stenosis in the internal  carotid arteries is less than 50% bilaterally.      Assessment and Plan  1. Aortic stenosis - mild by recent echo, no significant symptoms - continue  to follow clinically  2. Carotid bruits - recent carotid US without significant stenosis, continue to follow clinically  3. LE Edema - does have some mild diasotlic dysfunction on echo, given lasix prn      Arnoldo Lenis, M.D.

## 2014-01-25 ENCOUNTER — Ambulatory Visit (INDEPENDENT_AMBULATORY_CARE_PROVIDER_SITE_OTHER): Payer: Medicare Other | Admitting: Orthopedic Surgery

## 2014-01-25 ENCOUNTER — Ambulatory Visit (INDEPENDENT_AMBULATORY_CARE_PROVIDER_SITE_OTHER): Payer: Medicare Other

## 2014-01-25 ENCOUNTER — Encounter: Payer: Self-pay | Admitting: Orthopedic Surgery

## 2014-01-25 VITALS — BP 117/59 | Ht 62.0 in | Wt 231.0 lb

## 2014-01-25 DIAGNOSIS — M4806 Spinal stenosis, lumbar region: Secondary | ICD-10-CM

## 2014-01-25 DIAGNOSIS — M545 Low back pain: Secondary | ICD-10-CM

## 2014-01-25 DIAGNOSIS — M48061 Spinal stenosis, lumbar region without neurogenic claudication: Secondary | ICD-10-CM | POA: Insufficient documentation

## 2014-01-25 MED ORDER — GABAPENTIN 100 MG PO CAPS: 100.0000 mg | ORAL_CAPSULE | Freq: Three times a day (TID) | ORAL | Status: DC

## 2014-01-25 NOTE — Patient Instructions (Signed)
We will order MRI for you and call you with results We will refer you to Neurology, Dr Merlene Laughter Follow up with your primary care doctor

## 2014-01-25 NOTE — Progress Notes (Signed)
Patient ID: Melanie Porter, female   DOB: 06/21/1940, 73 y.o.   MRN: [id number] Patient ID: Melanie Porter, female   DOB: 02-Oct-1940, 73 y.o.   MRN: [id number]  Chief Complaint  Patient presents with  . Leg Pain    bilateral leg pain    HPI Melanie Porter is a 73 y.o. female.  HPI This is a 73 year old female with multiple medical problems as listed below who presents with a history of lumbar spine fracture and progressive worsening bilateral leg numbness weakness associated with upper extremity numbness and tingling and difficulty walking over the last 3 months.  She also reports that she loses control of her bladder and urine.  She does not report any treatment other than Norco for pain.   Past Medical History  Diagnosis Date  . Depression   . Anxiety   . Arthritis   . Obesity   . Hypercholesteremia   . Fibromyalgia   . GERD (gastroesophageal reflux disease)   . Back fracture 05/21/10  . Knee fracture, right 05/21/10  . Ankle fracture, right 05/21/10  . Thrombocytopenia 02/19/2012  . 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 Splenomegaly 09/23/2012  . 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 Breast biopsy  1982    left benign  . Partial hysterectomy    . Esophagogastroduodenoscopy  03/02/2012    Dr. Oneida Alar:The mucosa of the esophagus appeared normal Multiple non-bleeding ulcers, ranging between 3-50mm in size were found in the gastric antrum  The duodenal mucosa showed no abnormalities in the bulb and second portion of the duodenum. path negative for H.pylori    Family History  Problem Relation Age of Onset  . Heart disease Father   . Diabetes Sister   . Hypertension Sister   . Heart disease Sister   . Hypertension Brother   . Heart disease Brother   . Colon cancer Neg  Hx     Social History History  Substance Use Topics  . Smoking status: Never Smoker   . Smokeless tobacco: Never Used  . Alcohol Use: No    No Known Allergies  Current Outpatient Prescriptions  Medication Sig Dispense Refill  . bisacodyl (DULCOLAX) 5 MG EC tablet Take 10 mg by mouth daily as needed. Constipation      . Calcium Carbonate-Vit D-Min (CALTRATE PLUS PO) Take 1 tablet by mouth daily.       . Cyanocobalamin (VITAMIN B 12 PO) Take 2,000 mcg by mouth daily.      Marland Kitchen docusate sodium (COLACE) 100 MG capsule Take 100 mg by mouth at bedtime. Takes 2 at bedtime      . Flaxseed, Linseed, (FLAXSEED OIL) 1000 MG CAPS Take 1,000 mg by mouth 2 (two) times daily.       Marland Kitchen FLUoxetine (PROZAC) 40 MG capsule Take 40 mg by mouth daily.        . furosemide (LASIX) 20 MG tablet Take 1 tablet (20 mg total) by mouth daily. Take prn as needed for swelling  90 tablet  3  . HYDROcodone-acetaminophen (NORCO/VICODIN) 5-325 MG per tablet Take 1 tablet by mouth every 6 (six) hours as needed. Pain      . loratadine (CLARITIN) 10 MG tablet Take 10 mg by mouth as needed for allergies.      Marland Kitchen  Multiple Vitamins-Minerals (MULTIVITAMIN GUMMIES ADULT PO) Take 2 each by mouth daily. Vitafusion Women's complete multivitamin gummy      . omeprazole (PRILOSEC) 20 MG capsule Take 20 mg by mouth daily.        . pantoprazole (PROTONIX) 40 MG tablet Take 1 tablet (40 mg total) by mouth daily. Take 30 minutes prior to breakfast.  30 tablet  5  . Propylene Glycol (SYSTANE BALANCE) 0.6 % SOLN Apply 1 drop to eye daily as needed. Dry Eyes      . psyllium (METAMUCIL) 58.6 % powder Take 1 packet by mouth as needed.      . simvastatin (ZOCOR) 40 MG tablet Take 40 mg by mouth every evening.      . traZODone (DESYREL) 100 MG tablet Take 100 mg by mouth at bedtime. Takes 1/2 tablet at bedtime.      . trolamine salicylate (ASPERCREME) 10 % cream Apply topically as needed.       No current facility-administered medications for this  visit.    Review of Systems Review of Systems  Constitutional: Positive for fever, chills and fatigue.  HENT: Positive for ear pain.   Eyes: Positive for visual disturbance.  Respiratory: Positive for cough and shortness of breath.   Cardiovascular: Positive for leg swelling.  Gastrointestinal: Positive for abdominal pain and constipation.  Genitourinary: Positive for difficulty urinating.  Musculoskeletal: Positive for arthralgias, back pain, gait problem, joint swelling, myalgias, neck pain and neck stiffness.  Skin: Positive for rash.  Allergic/Immunologic: Positive for environmental allergies.  Neurological: Positive for dizziness, weakness and numbness.  Psychiatric/Behavioral: The patient is nervous/anxious.   All other systems reviewed and are negative.   Blood pressure 117/59, height 5\' 2"  (1.575 m), weight 231 lb (104.781 kg).  Physical Exam Physical Exam   Objective:     General :    alert, cooperative and mild distress  Gait:  Abnormal. The patient can bear weight   Tenderness:   bilateral paralumbar, bilateral iliac crest, bilateral sacroiliac, bilateral gluteal, bilateral ischial tuberosity/ies, bilateral greater trochanter, bilateral radicular  Edema:   ankles left greater than right .  Back ROM:  leg lengths equal with no atrophy  abnormal could not flex or extend more than 10 noted, poor rotation               Atrophy:    is absent  Pulses:  2+ and symmetric  Strength:  abductor 5/5; quadraceps 5/5; hamstrings 5/5; adductors 5/5; iliopsoas 5/5, upper extremities grade 5 strength as well   Straight  Leg Raise:  pain without radicular symptoms   Patellar Reflexes:  2+ bilaterally  Ankle Reflexes:   1+ bilaterally    her upper extremities were well aligned without contracture subluxation atrophy or tremor   Imaging I interpreted the images as severe spinal  spondylosis and degenerative disc disease with scoliosis      Assessment:      Encounter  Diagnosis  Name Primary?  . Spinal stenosis of lumbar region Yes        Plan:      Meds ordered this encounter  Medications  . gabapentin (NEURONTIN) 100 MG capsule    Sig: Take 1 capsule (100 mg total) by mouth 3 (three) times daily.    Dispense:  60 capsule    Refill:  5   I recommend MRI of the spine to evaluate the bladder control loss and the leg pain, most likely secondary to spinal stenosis.  She will need referral  to neurology , Dr.Doonquah  She will need to eventually be seen by neurosurgery probably.        Arther Abbott 01/25/2014, 9:53 AM

## 2014-01-31 ENCOUNTER — Encounter: Payer: Self-pay | Admitting: Orthopedic Surgery

## 2014-02-04 ENCOUNTER — Ambulatory Visit (HOSPITAL_COMMUNITY)
Admission: RE | Admit: 2014-02-04 | Discharge: 2014-02-04 | Disposition: A | Discharge status: Home / Self Care | Payer: Medicare Other | Source: Ambulatory Visit | Attending: Orthopedic Surgery | Admitting: Orthopedic Surgery

## 2014-02-04 DIAGNOSIS — R200 Anesthesia of skin: Secondary | ICD-10-CM | POA: Insufficient documentation

## 2014-02-04 DIAGNOSIS — M5126 Other intervertebral disc displacement, lumbar region: Secondary | ICD-10-CM | POA: Insufficient documentation

## 2014-02-04 DIAGNOSIS — M8448XD Pathological fracture, other site, subsequent encounter for fracture with routine healing: Secondary | ICD-10-CM | POA: Insufficient documentation

## 2014-02-04 DIAGNOSIS — M545 Low back pain: Secondary | ICD-10-CM | POA: Diagnosis present

## 2014-02-04 DIAGNOSIS — M48061 Spinal stenosis, lumbar region without neurogenic claudication: Secondary | ICD-10-CM

## 2014-02-14 ENCOUNTER — Ambulatory Visit (INDEPENDENT_AMBULATORY_CARE_PROVIDER_SITE_OTHER): Payer: Medicare Other | Admitting: Orthopedic Surgery

## 2014-02-14 ENCOUNTER — Encounter: Payer: Self-pay | Admitting: Orthopedic Surgery

## 2014-02-14 VITALS — BP 120/64 | Ht 61.0 in | Wt 231.0 lb

## 2014-02-14 DIAGNOSIS — M4806 Spinal stenosis, lumbar region: Secondary | ICD-10-CM

## 2014-02-14 DIAGNOSIS — M48061 Spinal stenosis, lumbar region without neurogenic claudication: Secondary | ICD-10-CM

## 2014-02-14 NOTE — Progress Notes (Signed)
Patient ID: Melanie Porter, female   DOB: January 10, 1941, 73 y.o.   MRN: [id number]  Chief Complaint  Patient presents with  . Results    MRI L SPINE RESULTS   The patient has complaints of back pain and left leg radicular pain  No trauma chronic history of back problems manifested lately with spinal stenosis and radicular symptoms in the left lower extremity.  Review of systems she still has control of her bowel and bladder  MRI was done medication list check she has gabapentin has not started yet  MRI shows spinal stenosis root impingement  Recommend referral to a spine specialist for further definitive care

## 2014-02-14 NOTE — Patient Instructions (Signed)
Will refer to Neurosurgery

## 2014-02-23 ENCOUNTER — Telehealth: Payer: Self-pay | Admitting: Gastroenterology

## 2014-02-23 NOTE — Telephone Encounter (Signed)
Needs routine follow-up to set up EGD for variceal screening. Lost to follow-up from last visit. Was sent to cardiology due to murmur noted on exam, and she was evaluated for this. Now just needs to follow-up with [place].

## 2014-02-28 NOTE — Telephone Encounter (Signed)
APPT MADE AND PATIENT AWARE °

## 2014-04-13 ENCOUNTER — Ambulatory Visit: Payer: Medicare Other | Admitting: Gastroenterology

## 2014-04-29 ENCOUNTER — Encounter: Payer: Self-pay | Admitting: Gastroenterology

## 2014-06-08 ENCOUNTER — Other Ambulatory Visit: Payer: Self-pay

## 2014-06-08 ENCOUNTER — Telehealth: Payer: Self-pay

## 2014-06-08 ENCOUNTER — Encounter: Payer: Self-pay | Admitting: Gastroenterology

## 2014-06-08 ENCOUNTER — Ambulatory Visit (INDEPENDENT_AMBULATORY_CARE_PROVIDER_SITE_OTHER): Payer: Medicare Other | Admitting: Gastroenterology

## 2014-06-08 VITALS — BP 110/70 | HR 73 | Temp 98.2°F | Ht 62.0 in | Wt 225.0 lb

## 2014-06-08 DIAGNOSIS — K279 Peptic ulcer, site unspecified, unspecified as acute or chronic, without hemorrhage or perforation: Secondary | ICD-10-CM

## 2014-06-08 DIAGNOSIS — K7460 Unspecified cirrhosis of liver: Secondary | ICD-10-CM | POA: Diagnosis not present

## 2014-06-08 DIAGNOSIS — K7581 Nonalcoholic steatohepatitis (NASH): Secondary | ICD-10-CM | POA: Diagnosis not present

## 2014-06-08 NOTE — Patient Instructions (Signed)
CONTINUE OMEPRAZOLE DAILY.  UPPER ENDOSCOPY APR 15.  COMPLETE LABS AND ULTRASOUND.  FOLLOW UP IN 6 MOS.

## 2014-06-08 NOTE — Progress Notes (Signed)
ON RECALL LIST  °

## 2014-06-08 NOTE — Telephone Encounter (Signed)
REVIEWED.  

## 2014-06-08 NOTE — Telephone Encounter (Signed)
T/C from [place] at [facility] asking for a different code for the AFP tumor marker. I gave her the code for cirrhosis and she said her insurance would not accept that. I told her I will have to check with DR. Fields, this is first time I have been told that would not cover for AFP.  She said she can draw it, but pt will probably be billed for it.

## 2014-06-08 NOTE — Assessment & Plan Note (Signed)
DUE TO NASH-WELL COMPENSATED LIVER DISEASE. NO SIGNS OR SYMPTOMS OF ACTIVE GI BLEED.  EGD IN APR EARLY AM PER PT REQUEST. DISCUSSED PROCEDURE, BENEFITS, AND RISKS OF PROCEDURE. COMPLETE LABS AND U/S. OPV IN 6 MOS

## 2014-06-08 NOTE — Telephone Encounter (Signed)
Per Dr. Oneida Alar I called and gave them the code K74.60 to try for the AFP tumor marker.  They will call if questions.

## 2014-06-08 NOTE — Progress Notes (Addendum)
   Subjective:    Patient ID: Melanie Porter, female    DOB: 09-10-1940, 74 y.o.   MRN: [id number]  [name], MD  HPI HAVING SINUS PROBLEMS AND LEG PAIN. SUBJECTIVE CHILLS. LAST EGD DEC 2013. LAST LABS/U/S AUG 2016. PT DENIES FEVER, CHILLS, HEMATOCHEZIA, HEMATEMESIS, nausea, vomiting, or melena. NO problems with sedation, heartburn or indigestion.   Past Medical History  Diagnosis Date  . Depression   . Anxiety   . Arthritis   . Obesity   . Hypercholesteremia   . Fibromyalgia   . GERD (gastroesophageal reflux disease)   . Back fracture 05/21/10  . Knee fracture, right 05/21/10  . Ankle fracture, right 05/21/10  . Thrombocytopenia 02/19/2012  . 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 Splenomegaly 09/23/2012  . 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 Breast biopsy  1982    left benign  . Partial hysterectomy    . Esophagogastroduodenoscopy  03/02/2012    Dr. Oneida Alar:The mucosa of the esophagus appeared normal Multiple non-bleeding ulcers, ranging between 3-60mm in size were found in the gastric antrum  The duodenal mucosa showed no abnormalities in the bulb and second portion of the duodenum. path negative for H.pylori    Allergies as of 06/08/2014  . (No Known Allergies)    Family History  Problem Relation Age of Onset  . Heart disease Father   . Diabetes Sister   . Hypertension Sister   . Heart disease Sister   . Hypertension Brother   . Heart disease Brother   . Colon cancer Neg Hx     History   Social History  . Marital Status: Married    Spouse Name: N/A  . Number of Children: N/A  . Years of Education: N/A   Occupational History  . 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 Smoking status: Never Smoker    . Smokeless tobacco: Never Used  . Alcohol Use: No  . Drug Use: No  . Sexual Activity: No   Other Topics Concern  . Not on file   Social History Narrative    Review of Systems: See HPI, otherwise negative ROS   Physical Exam: BP 110/70 mmHg  Pulse 73  Temp(Src) 98.2 F (36.8 C) (Oral)  Ht 5\' 2"  (1.575 m)  Wt 225 lb (102.059 kg)  BMI 41.14 kg/m2 General:   Alert,  pleasant and cooperative in NAD Head:  Normocephalic and atraumatic. Neck:  Supple; Lungs:  Clear throughout to auscultation.    Heart:  Regular rate and rhythm. Abdomen:  Soft, nontender and nondistended. Normal bowel sounds, without guarding, and without rebound.   Neurologic:  Alert and  oriented x4;  grossly normal neurologically.  Impression/Plan:        Review of Systems     Objective:   Physical Exam        Assessment & Plan:

## 2014-06-08 NOTE — Assessment & Plan Note (Signed)
.  ASYMPTOMATIC

## 2014-06-09 LAB — COMPREHENSIVE METABOLIC PANEL
ALBUMIN: 3.5 g/dL (ref 3.5–5.2)
ALT: 26 U/L (ref 0–35)
AST: 33 U/L (ref 0–37)
Alkaline Phosphatase: 109 U/L (ref 39–117)
BUN: 11 mg/dL (ref 6–23)
CO2: 28 meq/L (ref 19–32)
Calcium: 9.2 mg/dL (ref 8.4–10.5)
Chloride: 103 mEq/L (ref 96–112)
Creat: 0.7 mg/dL (ref 0.50–1.10)
Glucose, Bld: 91 mg/dL (ref 70–99)
Potassium: 3.9 mEq/L (ref 3.5–5.3)
Sodium: 139 mEq/L (ref 135–145)
Total Bilirubin: 0.5 mg/dL (ref 0.2–1.2)
Total Protein: 6.8 g/dL (ref 6.0–8.3)

## 2014-06-09 LAB — PROTIME-INR
INR: 1.13 (ref <1.50)
Prothrombin Time: 14.5 seconds (ref 11.6–15.2)

## 2014-06-09 LAB — CBC WITH DIFFERENTIAL/PLATELET
BASOS PCT: 0 % (ref 0–1)
Basophils Absolute: 0 10*3/uL (ref 0.0–0.1)
EOS ABS: 0.1 10*3/uL (ref 0.0–0.7)
Eosinophils Relative: 2 % (ref 0–5)
HCT: 38.9 % (ref 36.0–46.0)
HEMOGLOBIN: 12.4 g/dL (ref 12.0–15.0)
Lymphocytes Relative: 30 % (ref 12–46)
Lymphs Abs: 1.6 10*3/uL (ref 0.7–4.0)
MCH: 32 pg (ref 26.0–34.0)
MCHC: 31.9 g/dL (ref 30.0–36.0)
MCV: 100.5 fL — ABNORMAL HIGH (ref 78.0–100.0)
MPV: 11.7 fL (ref 8.6–12.4)
Monocytes Absolute: 0.5 10*3/uL (ref 0.1–1.0)
Monocytes Relative: 10 % (ref 3–12)
NEUTROS ABS: 3.1 10*3/uL (ref 1.7–7.7)
NEUTROS PCT: 58 % (ref 43–77)
Platelets: 93 10*3/uL — ABNORMAL LOW (ref 150–400)
RBC: 3.87 MIL/uL (ref 3.87–5.11)
RDW: 13.3 % (ref 11.5–15.5)
WBC: 5.4 10*3/uL (ref 4.0–10.5)

## 2014-06-09 LAB — AFP TUMOR MARKER: AFP-Tumor Marker: 4.7 ng/mL (ref <6.1)

## 2014-06-09 NOTE — Progress Notes (Signed)
cc'ed to pcp °

## 2014-06-10 ENCOUNTER — Ambulatory Visit (HOSPITAL_COMMUNITY)
Admission: RE | Admit: 2014-06-10 | Discharge: 2014-06-10 | Disposition: A | Discharge status: Home / Self Care | Payer: Medicare Other | Source: Ambulatory Visit | Attending: Gastroenterology | Admitting: Gastroenterology

## 2014-06-10 ENCOUNTER — Other Ambulatory Visit (HOSPITAL_COMMUNITY): Payer: Self-pay

## 2014-06-10 DIAGNOSIS — E669 Obesity, unspecified: Secondary | ICD-10-CM | POA: Insufficient documentation

## 2014-06-10 DIAGNOSIS — K279 Peptic ulcer, site unspecified, unspecified as acute or chronic, without hemorrhage or perforation: Secondary | ICD-10-CM

## 2014-06-10 DIAGNOSIS — D472 Monoclonal gammopathy: Secondary | ICD-10-CM

## 2014-06-10 DIAGNOSIS — K7460 Unspecified cirrhosis of liver: Secondary | ICD-10-CM | POA: Diagnosis not present

## 2014-06-15 ENCOUNTER — Encounter (HOSPITAL_COMMUNITY): Payer: Medicare Other | Attending: Hematology & Oncology

## 2014-06-15 DIAGNOSIS — D472 Monoclonal gammopathy: Secondary | ICD-10-CM

## 2014-06-15 DIAGNOSIS — R161 Splenomegaly, not elsewhere classified: Secondary | ICD-10-CM | POA: Diagnosis not present

## 2014-06-15 DIAGNOSIS — K7460 Unspecified cirrhosis of liver: Secondary | ICD-10-CM | POA: Insufficient documentation

## 2014-06-15 DIAGNOSIS — D696 Thrombocytopenia, unspecified: Secondary | ICD-10-CM | POA: Diagnosis not present

## 2014-06-15 LAB — CBC WITH DIFFERENTIAL/PLATELET
BASOS ABS: 0 10*3/uL (ref 0.0–0.1)
BASOS PCT: 0 % (ref 0–1)
Eosinophils Absolute: 0.1 10*3/uL (ref 0.0–0.7)
Eosinophils Relative: 2 % (ref 0–5)
HCT: 37.8 % (ref 36.0–46.0)
Hemoglobin: 12.4 g/dL (ref 12.0–15.0)
Lymphocytes Relative: 24 % (ref 12–46)
Lymphs Abs: 1.2 10*3/uL (ref 0.7–4.0)
MCH: 32.8 pg (ref 26.0–34.0)
MCHC: 32.8 g/dL (ref 30.0–36.0)
MCV: 100 fL (ref 78.0–100.0)
Monocytes Absolute: 0.5 10*3/uL (ref 0.1–1.0)
Monocytes Relative: 10 % (ref 3–12)
NEUTROS ABS: 3.3 10*3/uL (ref 1.7–7.7)
NEUTROS PCT: 64 % (ref 43–77)
PLATELETS: 73 10*3/uL — AB (ref 150–400)
RBC: 3.78 MIL/uL — ABNORMAL LOW (ref 3.87–5.11)
RDW: 12.7 % (ref 11.5–15.5)
WBC: 5.1 10*3/uL (ref 4.0–10.5)

## 2014-06-15 LAB — COMPREHENSIVE METABOLIC PANEL
ALBUMIN: 3.4 g/dL — AB (ref 3.5–5.2)
ALT: 29 U/L (ref 0–35)
ANION GAP: 6 (ref 5–15)
AST: 40 U/L — ABNORMAL HIGH (ref 0–37)
Alkaline Phosphatase: 115 U/L (ref 39–117)
BILIRUBIN TOTAL: 0.8 mg/dL (ref 0.3–1.2)
BUN: 12 mg/dL (ref 6–23)
CALCIUM: 9 mg/dL (ref 8.4–10.5)
CO2: 27 mmol/L (ref 19–32)
CREATININE: 0.66 mg/dL (ref 0.50–1.10)
Chloride: 106 mmol/L (ref 96–112)
GFR calc Af Amer: >90 mL/min (ref >90)
GFR calc non Af Amer: 86 mL/min — ABNORMAL LOW (ref >90)
Glucose, Bld: 101 mg/dL — ABNORMAL HIGH (ref 70–99)
Potassium: 3.8 mmol/L (ref 3.5–5.1)
Sodium: 139 mmol/L (ref 135–145)
Total Protein: 7.2 g/dL (ref 6.0–8.3)

## 2014-06-15 NOTE — Progress Notes (Signed)
Labs drawn

## 2014-06-16 LAB — KAPPA/LAMBDA LIGHT CHAINS
Kappa free light chain: 27.3 mg/L — ABNORMAL HIGH (ref 3.30–19.40)
Kappa, lambda light chain ratio: 0.08 — ABNORMAL LOW (ref 0.26–1.65)
Lambda free light chains: 344.06 mg/L — ABNORMAL HIGH (ref 5.71–26.30)

## 2014-06-16 LAB — BETA 2 MICROGLOBULIN, SERUM: Beta-2 Microglobulin: 2.6 mg/L — ABNORMAL HIGH (ref 0.6–2.4)

## 2014-06-21 LAB — MULTIPLE MYELOMA PANEL, SERUM
ALBUMIN ELP: 48.3 % — AB (ref 55.8–66.1)
ALPHA-1-GLOBULIN: 4.1 % (ref 2.9–4.9)
ALPHA-2-GLOBULIN: 10.7 % (ref 7.1–11.8)
BETA 2: 7.1 % — AB (ref 3.2–6.5)
BETA GLOBULIN: 5.3 % (ref 4.7–7.2)
GAMMA GLOBULIN: 24.5 % — AB (ref 11.1–18.8)
IGM, SERUM: 227 mg/dL (ref 52–322)
IgA: 381 mg/dL — ABNORMAL HIGH (ref 69–380)
IgG (Immunoglobin G), Serum: 1470 mg/dL (ref 690–1700)
M-Spike, %: 0.31 g/dL
TOTAL PROTEIN: 7.2 g/dL (ref 6.0–8.3)

## 2014-06-22 ENCOUNTER — Encounter (HOSPITAL_BASED_OUTPATIENT_CLINIC_OR_DEPARTMENT_OTHER): Payer: Medicare Other | Admitting: Hematology & Oncology

## 2014-06-22 ENCOUNTER — Encounter (HOSPITAL_COMMUNITY): Payer: Self-pay | Admitting: Hematology & Oncology

## 2014-06-22 VITALS — BP 122/59 | HR 73 | Temp 97.8°F | Resp 20 | Wt 225.5 lb

## 2014-06-22 DIAGNOSIS — D696 Thrombocytopenia, unspecified: Secondary | ICD-10-CM

## 2014-06-22 DIAGNOSIS — K7460 Unspecified cirrhosis of liver: Secondary | ICD-10-CM | POA: Diagnosis not present

## 2014-06-22 NOTE — Telephone Encounter (Addendum)
PLEASE CALL PT. HER LIVER TEST AND TUMOR MARKER ARE NORMAL. HER BLOOD COUNT IS NORMAL EXCEPT FOR HER PLATELET COUNT. HER U/S SHOWS CIRRHOSIS. EGD APR 15. CONTINUE YOUR WEIGHT LOSS EFFORTS.

## 2014-06-22 NOTE — Progress Notes (Signed)
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 female returns for  regular  visit for followup of thrombocytopenia in the setting of cirrhosis of liver and splenomegaly with also some intra-abdominal lymphadenopathy.    She continues to do fairly well with her only limitations being secondary to arthritis. She states she is also having some problems with sciatica. She is as active as she can be, she attends church regularly. She gets screening mammograms. She denies any change in baseline performance status since her last visit.  She denies any B. symptomatology including fevers, chills, night sweats, unintentional weight loss, and decreased appetite. She denies any early satiety.  Hematologically, the patient denies any complaints and ROS questioning is negative.   Past Medical History  Diagnosis Date  . Depression   . Anxiety   . Arthritis   . Obesity   . Hypercholesteremia   . Fibromyalgia   . GERD (gastroesophageal reflux disease)   . Back fracture 05/21/10  . Knee fracture, right 05/21/10  . Ankle fracture, right 05/21/10  . Thrombocytopenia 02/19/2012  . Nonalcoholic steatohepatitis (NASH) BMI > 40    Melanie Porter. 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 Splenomegaly 09/23/2012  . IgM monoclonal gammopathy of uncertain significance 09/23/2012    has Fibromyalgia; Hyperlipidemia; Arthritis; GERD (gastroesophageal reflux disease); Thrombocytopenia; Cirrhosis of liver without ascites; Splenomegaly; IgM monoclonal gammopathy of uncertain significance; Platelet disorder(CLUMPING); Dermatitis perineal; Osteopenia; Breast mass in female; Vaginal atrophy; PUD (peptic ulcer disease); Undiagnosed cardiac murmurs; and  Spinal stenosis of lumbar region on her problem list.     has No Known Allergies.  Melanie Porter does not currently have medications on file.  Past Surgical History  Procedure Laterality Date  . Breast biopsy  1982    left benign  . Partial hysterectomy    . Esophagogastroduodenoscopy  03/02/2012    Dr. Oneida Alar:The mucosa of the esophagus appeared normal Multiple non-bleeding ulcers, ranging between 3-19mm in size were found in the gastric antrum  The duodenal mucosa showed no abnormalities in the bulb and second portion of the duodenum. path negative for H.pylori    Denies any headaches, dizziness, double vision, fevers, chills, night sweats, nausea, vomiting, diarrhea, constipation, chest pain, heart palpitations, shortness of breath, blood in stool, black tarry stool, urinary pain, urinary burning, urinary frequency, hematuria.   PHYSICAL EXAMINATION  ECOG PERFORMANCE STATUS: 2 - Symptomatic, <50% confined to bed  There were no vitals filed for this visit.  GENERAL:alert, no distress, well nourished, well developed, comfortable, cooperative, obese and smiling SKIN: skin color, texture, turgor are normal, no rashes or significant lesions HEAD: Normocephalic, No masses, lesions, tenderness or abnormalities EYES: normal, Conjunctiva are pink and non-injected EARS: External ears normal OROPHARYNX:mucous membranes are moist  NECK: supple, no adenopathy, thyroid normal size, non-tender, without nodularity, no stridor, non-tender, trachea midline LYMPH:  no palpable lymphadenopathy, no hepatosplenomegaly BREAST:breasts appear normal, no suspicious masses, no skin or nipple changes or axillary nodes, B/L fibroglandular tissue in breast LUNGS: clear to auscultation and percussion HEART: regular rate & rhythm, no murmurs, no gallops, S1 normal and S2 normal ABDOMEN:abdomen soft, non-tender, obese, normal bowel sounds, no masses or organomegaly, difficult to assess for hepatosplenomegaly due to  body habitus and  no hepatosplenomegaly BACK: Back symmetric, no curvature., No CVA tenderness EXTREMITIES:less then 2 second capillary refill, no joint deformities, effusion, or inflammation, no edema, no skin discoloration, no clubbing, no cyanosis  NEURO: alert & oriented x 3 with fluent speech, no focal motor/sensory deficits, uses a cane for stability when ambulating    LABORATORY DATA: CBC    Component Value Date/Time   WBC 5.1 06/15/2014 0903   RBC 3.78* 06/15/2014 0903   HGB 12.4 06/15/2014 0903   HCT 37.8 06/15/2014 0903   PLT 73* 06/15/2014 0903   MCV 100.0 06/15/2014 0903   MCH 32.8 06/15/2014 0903   MCHC 32.8 06/15/2014 0903   RDW 12.Porter 06/15/2014 0903   LYMPHSABS 1.2 06/15/2014 0903   MONOABS 0.5 06/15/2014 0903   EOSABS 0.1 06/15/2014 0903   BASOSABS 0.0 06/15/2014 0903      Chemistry      Component Value Date/Time   NA 139 06/15/2014 0903   K 3.8 06/15/2014 0903   CL 106 06/15/2014 0903   CO2 27 06/15/2014 0903   BUN 12 06/15/2014 0903   CREATININE 0.66 06/15/2014 0903   CREATININE 0.70 06/08/2014 1510      Component Value Date/Time   CALCIUM 9.0 06/15/2014 0903   ALKPHOS 115 06/15/2014 0903   ALKPHOS 146 09/16/2013   AST 40* 06/15/2014 0903   AST 44 09/16/2013   ALT 29 06/15/2014 0903   ALT 39 09/16/2013   BILITOT 0.8 06/15/2014 0903   BILITOT 0.6 09/16/2013       RADIOGRAPHIC STUDIES:  CLINICAL DATA: Cirrhosis. Obesity.  EXAM: US ABDOMEN LIMITED - RIGHT UPPER QUADRANT  COMPARISON: Ultrasound 11/01/2013. CT 03/29/2013.  FINDINGS: Gallbladder:  No gallstones or wall thickening visualized. No sonographic Murphy sign noted.  Common bile duct:  Diameter: 4.3 mm.  Liver:  Liver has a nodular coarse echotexture consistent with patient's known cirrhosis. No focal hepatic abnormality.  IMPRESSION: Coarse nodular liver consistent with known cirrhosis. No focal hepatic abnormality. Exam is otherwise unremarkable. No  gallstones or biliary distention.   Electronically Signed  By: Marcello Moores Register  On: 06/10/2014 10:03   ASSESSMENT:  1. Thrombocytopenia in setting of liver cirrhosis and splenomegaly 2. Cirrhosis of liver 3. Splenomegaly 4. IgM Kappa monoclonal protein, possibly a MGUS, Waldenstrom, or secondary to chronic inflammation secondary to cirrhosis of liver   THERAPY PLAN:   I have recommended ongoing observation with follow-up again in 6 months. Counts have remained stable. She is a very small M spike and her SPEP is pending today. If she has any significant change on her SPEP/IEP  would bring her back sooner.   He is to continue with her screening mammograms.  All questions were answered. The patient knows to call the clinic with any problems, questions or concerns. We can certainly see the patient much sooner if necessary.  This note was signed electronically Molli Hazard MD

## 2014-06-22 NOTE — Patient Instructions (Signed)
[place] at [facility] Discharge Instructions  RECOMMENDATIONS MADE BY THE CONSULTANT AND ANY TEST RESULTS WILL BE SENT TO YOUR REFERRING PHYSICIAN.  Return to clinic in 6 months as scheduled. Report any issues/concerns to clinic as needed prior to appointment.  Thank you for choosing [place] at [facility] to provide your oncology and hematology care.  To afford each patient quality time with our provider, please arrive at least 15 minutes before your scheduled appointment time.    You need to re-schedule your appointment should you arrive 10 or more minutes late.  We strive to give you quality time with our providers, and arriving late affects you and other patients whose appointments are after yours.  Also, if you no show three or more times for appointments you may be dismissed from the clinic at the providers discretion.     Again, thank you for choosing [facility].  Our hope is that these requests will decrease the amount of time that you wait before being seen by our physicians.       _____________________________________________________________  Should you have questions after your visit to [facility], please contact our office at [phone] between the hours of 8:30 a.m. and 4:30 p.m.  Voicemails left after 4:30 p.m. will not be returned until the following business day.  For prescription refill requests, have your pharmacy contact our office.

## 2014-06-23 NOTE — Telephone Encounter (Signed)
LMOM to call. ( off tomorrow).

## 2014-06-27 NOTE — Telephone Encounter (Signed)
PT is aware of results.  

## 2014-06-28 ENCOUNTER — Encounter (HOSPITAL_COMMUNITY): Payer: Self-pay | Admitting: Hematology & Oncology

## 2014-07-15 ENCOUNTER — Ambulatory Visit (HOSPITAL_COMMUNITY)
Admission: RE | Admit: 2014-07-15 | Discharge: 2014-07-15 | Disposition: A | Discharge status: Home / Self Care | Payer: Medicare Other | Source: Ambulatory Visit | Attending: Gastroenterology | Admitting: Gastroenterology

## 2014-07-15 ENCOUNTER — Encounter (HOSPITAL_COMMUNITY): Payer: Self-pay | Admitting: *Deleted

## 2014-07-15 ENCOUNTER — Encounter (HOSPITAL_COMMUNITY)
Admission: RE | Disposition: A | Discharge status: Home / Self Care | Payer: Self-pay | Source: Ambulatory Visit | Attending: Gastroenterology

## 2014-07-15 DIAGNOSIS — K279 Peptic ulcer, site unspecified, unspecified as acute or chronic, without hemorrhage or perforation: Secondary | ICD-10-CM | POA: Diagnosis not present

## 2014-07-15 DIAGNOSIS — K7581 Nonalcoholic steatohepatitis (NASH): Secondary | ICD-10-CM | POA: Insufficient documentation

## 2014-07-15 DIAGNOSIS — F419 Anxiety disorder, unspecified: Secondary | ICD-10-CM | POA: Diagnosis not present

## 2014-07-15 DIAGNOSIS — E780 Pure hypercholesterolemia: Secondary | ICD-10-CM | POA: Insufficient documentation

## 2014-07-15 DIAGNOSIS — Z7982 Long term (current) use of aspirin: Secondary | ICD-10-CM | POA: Diagnosis not present

## 2014-07-15 DIAGNOSIS — E669 Obesity, unspecified: Secondary | ICD-10-CM | POA: Diagnosis not present

## 2014-07-15 DIAGNOSIS — Z79891 Long term (current) use of opiate analgesic: Secondary | ICD-10-CM | POA: Diagnosis not present

## 2014-07-15 DIAGNOSIS — K2950 Unspecified chronic gastritis without bleeding: Secondary | ICD-10-CM | POA: Diagnosis not present

## 2014-07-15 DIAGNOSIS — M797 Fibromyalgia: Secondary | ICD-10-CM | POA: Insufficient documentation

## 2014-07-15 DIAGNOSIS — F329 Major depressive disorder, single episode, unspecified: Secondary | ICD-10-CM | POA: Diagnosis not present

## 2014-07-15 DIAGNOSIS — Z90711 Acquired absence of uterus with remaining cervical stump: Secondary | ICD-10-CM | POA: Diagnosis not present

## 2014-07-15 DIAGNOSIS — K219 Gastro-esophageal reflux disease without esophagitis: Secondary | ICD-10-CM | POA: Insufficient documentation

## 2014-07-15 DIAGNOSIS — K259 Gastric ulcer, unspecified as acute or chronic, without hemorrhage or perforation: Secondary | ICD-10-CM | POA: Insufficient documentation

## 2014-07-15 DIAGNOSIS — Z79899 Other long term (current) drug therapy: Secondary | ICD-10-CM | POA: Diagnosis not present

## 2014-07-15 DIAGNOSIS — D472 Monoclonal gammopathy: Secondary | ICD-10-CM | POA: Diagnosis not present

## 2014-07-15 DIAGNOSIS — Z6841 Body Mass Index (BMI) 40.0 and over, adult: Secondary | ICD-10-CM | POA: Insufficient documentation

## 2014-07-15 DIAGNOSIS — M1990 Unspecified osteoarthritis, unspecified site: Secondary | ICD-10-CM | POA: Insufficient documentation

## 2014-07-15 DIAGNOSIS — Z0389 Encounter for observation for other suspected diseases and conditions ruled out: Secondary | ICD-10-CM | POA: Diagnosis present

## 2014-07-15 HISTORY — PX: ESOPHAGOGASTRODUODENOSCOPY: SHX5428

## 2014-07-15 SURGERY — EGD (ESOPHAGOGASTRODUODENOSCOPY)
Anesthesia: Moderate Sedation

## 2014-07-15 MED ORDER — MEPERIDINE HCL 100 MG/ML IJ SOLN
INTRAMUSCULAR | Status: DC | PRN
  Administered 2014-07-15: 25 mg via INTRAVENOUS

## 2014-07-15 MED ORDER — MEPERIDINE HCL 100 MG/ML IJ SOLN
INTRAMUSCULAR | Status: AC
  Filled 2014-07-15: qty 2

## 2014-07-15 MED ORDER — LIDOCAINE VISCOUS 2 % MT SOLN
OROMUCOSAL | Status: AC
  Filled 2014-07-15: qty 15

## 2014-07-15 MED ORDER — STERILE WATER FOR IRRIGATION IR SOLN
Status: DC | PRN
  Administered 2014-07-15: 09:00:00

## 2014-07-15 MED ORDER — MIDAZOLAM HCL 5 MG/5ML IJ SOLN
INTRAMUSCULAR | Status: DC | PRN
  Administered 2014-07-15 (×2): 2 mg via INTRAVENOUS

## 2014-07-15 MED ORDER — MIDAZOLAM HCL 5 MG/5ML IJ SOLN
INTRAMUSCULAR | Status: AC
  Filled 2014-07-15: qty 10

## 2014-07-15 MED ORDER — SODIUM CHLORIDE 0.9 % IV SOLN
INTRAVENOUS | Status: DC
Start: 2014-07-15 — End: 2014-07-15
  Administered 2014-07-15: 08:00:00 via INTRAVENOUS

## 2014-07-15 NOTE — Op Note (Signed)
[facility] [address] [place], [address]   ENDOSCOPY PROCEDURE REPORT  PATIENT: Melanie Porter, Melanie Porter  MR#: [id number] BIRTHDATE: December 16, 1940 , 73  yrs. old GENDER: female  ENDOSCOPIST: Danie Binder, MD REFERRED MG:NOIB Hilma Favors, M.D.  PROCEDURE DATE: 07-20-14 PROCEDURE:   EGD w/ biopsy  INDICATIONS:screening for varices. MEDICATIONS: Demerol 25 mg IV and Versed 5 mg IV TOPICAL ANESTHETIC:   Viscous Xylocaine ASA CLASS:  DESCRIPTION OF PROCEDURE:     Physical exam was performed.  Informed consent was obtained from the patient after explaining the benefits, risks, and alternatives to the procedure.  The patient was connected to the monitor and placed in the left lateral position.  Continuous oxygen was provided by nasal cannula and IV medicine administered through an indwelling cannula.  After administration of sedation, the patients esophagus was intubated and the [id number] ([id number])  endoscope was advanced under direct visualization to the second portion of the duodenum.  The scope was removed slowly by carefully examining the color, texture, anatomy, and integrity of the mucosa on the way out.  The patient was recovered in endoscopy and discharged home in satisfactory condition.   ESOPHAGUS: The mucosa of the esophagus appeared normal.   STOMACH: A small non-bleeding, round and deep ulcer with surrounding edema was found in the gastric body.  Biopsies were taken around the ulcer. DUODENUM: The duodenal mucosa showed no abnormalities in the bulb and 2nd part of the duodenum. COMPLICATIONS: There were no immediate complications.  ENDOSCOPIC IMPRESSION: 1.   NO ESOPHAGEAL, GASTRIC, OR DUODENAL VARICES 2.   Small 1-2 MM GASTRIC ulcer  RECOMMENDATIONS: STRICTLY AVOID ASPIRIN, BC/GOODY POWDERS, IBUPROFEN/MOTRIN, OR NAPROXEN/ALEVE UNLESS MEDICALLY NECESSARY. USE TYLENOL AS NEEDED FOR PAIN. CONTINUE OMEPRAZOLE.  TAKE 30 MINUTES PRIOR TO YOUR FIRST  MEALTWICE DAILY FOR 3 MOS THEN ONCE DAILY. FOLLOW A LOW FAT DIET. AWAIT BIOPSY RESULTS. FOLLOW UP IN SEP 2016.  REPEAT EXAM:  eSigned:  Danie Binder, MD 2014-07-20 4:12 PM    CPT CODES: ICD CODES:  The ICD and CPT codes recommended by this software are interpretations from the data that the clinical staff has captured with the software.  The verification of the translation of this report to the ICD and CPT codes and modifiers is the sole responsibility of the health care institution and practicing physician where this report was generated.  [place]. will not be held responsible for the validity of the ICD and CPT codes included on this report.  AMA assumes no liability for data contained or not contained herein. CPT is a Designer, television/film set of the [employer].

## 2014-07-15 NOTE — Discharge Instructions (Signed)
YOU HAVE ONE SMALL ULCERS LIKELY DUE TO ASA USE. YOU HAVE MILD GASTRITIS. I BIOPSIED YOUR STOMACH.   STRICTLY AVOID ASPIRIN, BC/GOODY POWDERS, IBUPROFEN/MOTRIN, OR NAPROXEN/ALEVE.  USE TYLENOL AS NEEDED FOR PAIN.  CONTINUE OMEPRAZOLE.  TAKE 30 MINUTES PRIOR TO YOUR FIRST MEALTWICE DAILY FOR 3 MOS THEN ONCE DAILY.  FOLLOW A LOW FAT DIET. SEE INFO BELOW.  YOUR BIOPSY RESULTS WILL BE AVAILABLE IN MY CHART AFTER  APR 19 AND MY OFFICE WILL CONTACT YOU IN 10-14 DAYS WITH YOUR RESULTS.   FOLLOW UP IN SEP 2016.     UPPER ENDOSCOPY AFTER CARE Read the instructions outlined below and refer to this sheet in the next week. These discharge instructions provide you with general information on caring for yourself after you leave the hospital. While your treatment has been planned according to the most current medical practices available, unavoidable complications occasionally occur. If you have any problems or questions after discharge, call DR. Gregorey Nabor, [phone].  ACTIVITY  You may resume your regular activity, but move at a slower pace for the next 24 hours.   Take frequent rest periods for the next 24 hours.   Walking will help get rid of the air and reduce the bloated feeling in your belly (abdomen).   No driving for 24 hours (because of the medicine (anesthesia) used during the test).   You may shower.   Do not sign any important legal documents or operate any machinery for 24 hours (because of the anesthesia used during the test).    NUTRITION  Drink plenty of fluids.   You may resume your normal diet as instructed by your doctor.   Begin with a light meal and progress to your normal diet. Heavy or fried foods are harder to digest and may make you feel sick to your stomach (nauseated).   Avoid alcoholic beverages for 24 hours or as instructed.    MEDICATIONS  You may resume your normal medications.   WHAT YOU CAN EXPECT TODAY  Some feelings of bloating in the abdomen.     Passage of more gas than usual.    IF YOU HAD A BIOPSY TAKEN DURING THE UPPER ENDOSCOPY:    Eat a soft diet IF YOU HAVE NAUSEA, BLOATING, ABDOMINAL PAIN, OR VOMITING.    FINDING OUT THE RESULTS OF YOUR TEST Not all test results are available during your visit. DR. Oneida Alar WILL CALL YOU WITHIN 14 DAYS OF YOUR PROCEDUE WITH YOUR RESULTS. Do not assume everything is normal if you have not heard from DR. Ethan Clayburn, CALL HER OFFICE AT [phone].  SEEK IMMEDIATE MEDICAL ATTENTION AND CALL THE OFFICE: [phone] IF:  You have more than a spotting of blood in your stool.   Your belly is swollen (abdominal distention).   You are nauseated or vomiting.   You have a temperature over 101F.   You have abdominal pain or discomfort that is severe or gets worse throughout the day.   Ulcer Disease (Peptic Ulcer, Gastric Ulcer, Duodenal Ulcer) You have an ulcer. This may be in your stomach (gastric ulcer) or in the first part of your small bowel, the duodenum (duodenal ulcer). An ulcer is a break in the lining of the stomach or duodenum. The ulcer causes erosion into the deeper tissue.  CAUSES The stomach has a lining to protect itself from the acid that digests food. The lining can be damaged in two main ways:  The [place] Pylori bacteria (H. Pyolori) can infect the lining of the stomach  and cause ulcers.   Nonsteroidal, anti-inflammatory medications (NSAIDS) can cause gastric ulcerations.   Smoking tobacco can increase the acid in the stomach. This can lead to ulcers, and will impair healing of ulcers.   Other factors, such as alcohol use and stress may contribute to ulcer formation.  SYMPTOMS The problems (symptoms) of ulcer disease are usually a burning or gnawing of the mid-upper belly (abdomen). This is often worse on an empty stomach and may get better with food. This may be associated with feeling sick to your stomach (nausea), bloating, and vomiting.  HOME CARE  INSTRUCTIONS Continue regular work and usual activities unless advised otherwise by your caregiver.  Avoid tobacco, alcohol, and caffeine. Tobacco use will decrease and slow the rates of healing.   Avoid foods that seem to aggravate or cause discomfort.    Low-Fat Diet BREADS, CEREALS, PASTA, RICE, DRIED PEAS, AND BEANS These products are high in carbohydrates and most are low in fat. Therefore, they can be increased in the diet as substitutes for fatty foods. They too, however, contain calories and should not be eaten in excess. Cereals can be eaten for snacks as well as for breakfast.   FRUITS AND VEGETABLES It is good to eat fruits and vegetables. Besides being sources of fiber, both are rich in vitamins and some minerals. They help you get the daily allowances of these nutrients. Fruits and vegetables can be used for snacks and desserts.  MEATS Limit lean meat, chicken, [place], and fish to no more than 6 ounces per day. Beef, Pork, and Lamb Use lean cuts of beef, pork, and lamb. Lean cuts include:  Extra-lean ground beef.  Arm roast.  Sirloin tip.  Center-cut ham.  Round steak.  Loin chops.  Rump roast.  Tenderloin.  Trim all fat off the outside of meats before cooking. It is not necessary to severely decrease the intake of red meat, but lean choices should be made. Lean meat is rich in protein and contains a highly absorbable form of iron. Premenopausal women, in particular, should avoid reducing lean red meat because this could increase the risk for low red blood cells (iron-deficiency anemia).  Chicken and [place] These are good sources of protein. The fat of poultry can be reduced by removing the skin and underlying fat layers before cooking. Chicken and [place] can be substituted for lean red meat in the diet. Poultry should not be fried or covered with high-fat sauces. Fish and Shellfish Fish is a good source of protein. Shellfish contain cholesterol, but they usually are low  in saturated fatty acids. The preparation of fish is important. Like chicken and [place], they should not be fried or covered with high-fat sauces. EGGS Egg whites contain no fat or cholesterol. They can be eaten often. Try 1 to 2 egg whites instead of whole eggs in recipes or use egg substitutes that do not contain yolk. MILK AND DAIRY PRODUCTS Use skim or 1% milk instead of 2% or whole milk. Decrease whole milk, natural, and processed cheeses. Use nonfat or low-fat (2%) cottage cheese or low-fat cheeses made from vegetable oils. Choose nonfat or low-fat (1 to 2%) yogurt. Experiment with evaporated skim milk in recipes that call for heavy cream. Substitute low-fat yogurt or low-fat cottage cheese for sour cream in dips and salad dressings. Have at least 2 servings of low-fat dairy products, such as 2 glasses of skim (or 1%) milk each day to help get your daily calcium intake. FATS AND OILS Reduce the  total intake of fats, especially saturated fat. Butterfat, lard, and beef fats are high in saturated fat and cholesterol. These should be avoided as much as possible. Vegetable fats do not contain cholesterol, but certain vegetable fats, such as coconut oil, palm oil, and palm kernel oil are very high in saturated fats. These should be limited. These fats are often used in bakery goods, processed foods, popcorn, oils, and nondairy creamers. Vegetable shortenings and some peanut butters contain hydrogenated oils, which are also saturated fats. Read the labels on these foods and check for saturated vegetable oils. Unsaturated vegetable oils and fats do not raise blood cholesterol. However, they should be limited because they are fats and are high in calories. Total fat should still be limited to 30% of your daily caloric intake. Desirable liquid vegetable oils are corn oil, cottonseed oil, olive oil, canola oil, safflower oil, soybean oil, and sunflower oil. Peanut oil is not as good, but small amounts are  acceptable. Buy a heart-healthy tub margarine that has no partially hydrogenated oils in the ingredients. Mayonnaise and salad dressings often are made from unsaturated fats, but they should also be limited because of their high calorie and fat content. Seeds, nuts, peanut butter, olives, and avocados are high in fat, but the fat is mainly the unsaturated type. These foods should be limited mainly to avoid excess calories and fat. OTHER EATING TIPS Snacks  Most sweets should be limited as snacks. They tend to be rich in calories and fats, and their caloric content outweighs their nutritional value. Some good choices in snacks are graham crackers, melba toast, soda crackers, bagels (no egg), English muffins, fruits, and vegetables. These snacks are preferable to snack crackers, [place] fries, TORTILLA CHIPS, and POTATO chips. Popcorn should be air-popped or cooked in small amounts of liquid vegetable oil. Desserts Eat fruit, low-fat yogurt, and fruit ices instead of pastries, cake, and cookies. Sherbet, angel food cake, gelatin dessert, frozen low-fat yogurt, or other frozen products that do not contain saturated fat (pure fruit juice bars, frozen ice pops) are also acceptable.  COOKING METHODS Choose those methods that use little or no fat. They include: Poaching.  Braising.  Steaming.  Grilling.  Baking.  Stir-frying.  Broiling.  Microwaving.  Foods can be cooked in a nonstick pan without added fat, or use a nonfat cooking spray in regular cookware. Limit fried foods and avoid frying in saturated fat. Add moisture to lean meats by using water, broth, cooking wines, and other nonfat or low-fat sauces along with the cooking methods mentioned above. Soups and stews should be chilled after cooking. The fat that forms on top after a few hours in the refrigerator should be skimmed off. When preparing meals, avoid using excess salt. Salt can contribute to raising blood pressure in some people.  EATING  AWAY FROM HOME Order entres, potatoes, and vegetables without sauces or butter. When meat exceeds the size of a deck of cards (3 to 4 ounces), the rest can be taken home for another meal. Choose vegetable or fruit salads and ask for low-calorie salad dressings to be served on the side. Use dressings sparingly. Limit high-fat toppings, such as bacon, crumbled eggs, cheese, sunflower seeds, and olives. Ask for heart-healthy tub margarine instead of butter.

## 2014-07-15 NOTE — H&P (Signed)
Primary Care Physician:  [name], MD Primary Gastroenterologist:  Dr. Oneida Alar  Pre-Procedure History & Physical: HPI:  Melanie Porter is a 74 y.o. female here for [place].  Past Medical History  Diagnosis Date  . Depression   . Anxiety   . Arthritis   . Obesity   . Hypercholesteremia   . Fibromyalgia   . GERD (gastroesophageal reflux disease)   . Back fracture 05/21/10  . Knee fracture, right 05/21/10  . Ankle fracture, right 05/21/10  . Thrombocytopenia 02/19/2012  . 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 Splenomegaly 09/23/2012  . 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 Breast biopsy  1982    left benign  . Partial hysterectomy    . Esophagogastroduodenoscopy  03/02/2012    Dr. Oneida Alar:The mucosa of the esophagus appeared normal Multiple non-bleeding ulcers, ranging between 3-89mm in size were found in the gastric antrum  The duodenal mucosa showed no abnormalities in the bulb and second portion of the duodenum. path negative for H.pylori    Prior to Admission medications   Medication Sig Start Date End Date Taking? Authorizing Provider  aspirin 81 MG tablet Take 81 mg by mouth daily.   Yes Historical Provider, MD  bisacodyl (DULCOLAX) 5 MG EC tablet Take 10 mg by mouth daily as needed. Constipation   Yes Historical Provider, MD  Calcium Carbonate-Vit D-Min (CALTRATE PLUS PO) Take 1 tablet by mouth daily.    Yes Historical Provider, MD  Cyanocobalamin (VITAMIN B 12 PO) Take 2,000 mcg by mouth daily.   Yes Historical Provider, MD  docusate sodium (COLACE) 100 MG capsule Take 100 mg by mouth at bedtime. Takes 2 at bedtime   Yes Historical Provider, MD  Flaxseed, Linseed, (FLAXSEED OIL) 1000 MG CAPS Take 1,000 mg by mouth 2 (two) times daily.    Yes  Historical Provider, MD  FLUoxetine (PROZAC) 40 MG capsule Take 40 mg by mouth daily.     Yes Historical Provider, MD  furosemide (LASIX) 20 MG tablet Take 1 tablet (20 mg total) by mouth daily. Take prn as needed for swelling 12/28/13  Yes Arnoldo Lenis, MD  gabapentin (NEURONTIN) 100 MG capsule Take 1 capsule (100 mg total) by mouth 3 (three) times daily. 01/25/14  Yes Carole Civil, MD  loratadine (CLARITIN) 10 MG tablet Take 10 mg by mouth as needed for allergies.   Yes Historical Provider, MD  Magnesium 250 MG TABS Take by mouth.   Yes Historical Provider, MD  Multiple Vitamins-Minerals (MULTIVITAMIN GUMMIES ADULT PO) Take 2 each by mouth daily. Vitafusion Women's complete multivitamin gummy   Yes Historical Provider, MD  omeprazole (PRILOSEC) 20 MG capsule Take 20 mg by mouth daily.     Yes Historical Provider, MD  oxybutynin (DITROPAN-XL) 5 MG 24 hr tablet Take 5 mg by mouth at bedtime.   Yes Historical Provider, MD  Propylene Glycol (SYSTANE BALANCE) 0.6 % SOLN Apply 1 drop to eye daily as needed. Dry Eyes   Yes Historical Provider, MD  psyllium (METAMUCIL) 58.6 % powder Take 1 packet by mouth as needed.   Yes Historical Provider, MD  simvastatin (ZOCOR) 40 MG tablet Take 40 mg by mouth every evening.   Yes Historical Provider, MD  traMADol (ULTRAM-ER) 100 MG  24 hr tablet Take 100 mg by mouth daily.   Yes Historical Provider, MD  traZODone (DESYREL) 100 MG tablet Take 100 mg by mouth at bedtime. Takes 1/2 tablet at bedtime.   Yes Historical Provider, MD  trolamine salicylate (ASPERCREME) 10 % cream Apply topically as needed.   Yes Historical Provider, MD  HYDROcodone-acetaminophen (NORCO/VICODIN) 5-325 MG per tablet Take 1 tablet by mouth every 6 (six) hours as needed. Pain    Historical Provider, MD    Allergies as of 06/08/2014  . (No Known Allergies)    Family History  Problem Relation Age of Onset  . Heart disease Father   . Heart attack Father   . Diabetes Sister   .  Hypertension Sister   . Heart disease Sister   . Hypertension Brother   . Heart disease Brother   . Colon cancer Neg Hx   . Aneurysm Mother     History   Social History  . Marital Status: Married    Spouse Name: N/A  . Number of Children: N/A  . Years of Education: N/A   Occupational History  . 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 Smoking status: Never Smoker   . Smokeless tobacco: Never Used  . Alcohol Use: No  . Drug Use: No  . Sexual Activity: No   Other Topics Concern  . Not on file   Social History Narrative   HAS A HUSBAND WHO MAY HAVE DEMENTIA AND IS CHALLENGING TO CARE FOR.    Review of Systems: See HPI, otherwise negative ROS   Physical Exam: BP 128/55 mmHg  Pulse 64  Temp(Src) 97.7 F (36.5 C) (Oral)  Resp 20  Ht 5\' 2"  (1.575 m)  Wt 225 lb (102.059 kg)  BMI 41.14 kg/m2  SpO2 92% General:   Alert,  pleasant and cooperative in NAD Head:  Normocephalic and atraumatic. Neck:  Supple; Lungs:  Clear throughout to auscultation.    Heart:  Regular rate and rhythm. Abdomen:  Soft, nontender and nondistended. Normal bowel sounds, without guarding, and without rebound.   Neurologic:  Alert and  oriented x4;  grossly normal neurologically.  Impression/Plan:    SCREENING VARICES  PLAN:  1.EGD TODAY

## 2014-07-18 ENCOUNTER — Encounter (HOSPITAL_COMMUNITY): Payer: Self-pay | Admitting: Gastroenterology

## 2014-07-20 ENCOUNTER — Other Ambulatory Visit (HOSPITAL_COMMUNITY): Payer: Self-pay | Admitting: Family Medicine

## 2014-07-20 DIAGNOSIS — Z1231 Encounter for screening mammogram for malignant neoplasm of breast: Secondary | ICD-10-CM

## 2014-07-23 ENCOUNTER — Telehealth: Payer: Self-pay | Admitting: Gastroenterology

## 2014-07-23 NOTE — Telephone Encounter (Signed)
Please call pt. HER stomach Bx shows gastritis. Continue OMEPRAZOLE 30 MINUTES PRIOR TO MEALS BID FOR 3 mos then once daily. FOLLOW A LOW FAT DIET. OPV IN in SEP 2016 E 30 CIRRHOSIS, GASTRITIS.

## 2014-07-25 NOTE — Telephone Encounter (Signed)
OV made °

## 2014-07-26 NOTE — Telephone Encounter (Signed)
LMOM to call.

## 2014-07-26 NOTE — Telephone Encounter (Signed)
Pt is aware of results. 

## 2014-07-27 ENCOUNTER — Ambulatory Visit (INDEPENDENT_AMBULATORY_CARE_PROVIDER_SITE_OTHER): Payer: Medicare Other | Admitting: Gynecology

## 2014-07-27 ENCOUNTER — Encounter: Payer: Self-pay | Admitting: Gynecology

## 2014-07-27 VITALS — BP 126/78 | Ht 61.0 in | Wt 225.0 lb

## 2014-07-27 DIAGNOSIS — Z01419 Encounter for gynecological examination (general) (routine) without abnormal findings: Secondary | ICD-10-CM

## 2014-07-27 DIAGNOSIS — IMO0002 Reserved for concepts with insufficient information to code with codable children: Secondary | ICD-10-CM

## 2014-07-27 DIAGNOSIS — R350 Frequency of micturition: Secondary | ICD-10-CM

## 2014-07-27 DIAGNOSIS — N8110 Cystocele, unspecified: Secondary | ICD-10-CM

## 2014-07-27 DIAGNOSIS — R300 Dysuria: Secondary | ICD-10-CM

## 2014-07-27 DIAGNOSIS — IMO0001 Reserved for inherently not codable concepts without codable children: Secondary | ICD-10-CM

## 2014-07-27 DIAGNOSIS — M8580 Other specified disorders of bone density and structure, unspecified site: Secondary | ICD-10-CM

## 2014-07-27 LAB — URINALYSIS W MICROSCOPIC + REFLEX CULTURE
BILIRUBIN URINE: NEGATIVE
Glucose, UA: NEGATIVE mg/dL
Hgb urine dipstick: NEGATIVE
KETONES UR: NEGATIVE mg/dL
Leukocytes, UA: NEGATIVE
NITRITE: NEGATIVE
PH: 7.5 (ref 5.0–8.0)
Protein, ur: NEGATIVE mg/dL
SPECIFIC GRAVITY, URINE: 1.015 (ref 1.005–1.030)
Urobilinogen, UA: 0.2 mg/dL (ref 0.0–1.0)

## 2014-07-27 NOTE — Patient Instructions (Signed)

## 2014-07-27 NOTE — Progress Notes (Signed)
Melanie Porter 28-Aug-1940 [id number]   History:    74 y.o.  for annual gyn exam who is only complaint today was of dysuria and frequency. Patient denied any back pain, fever, chills, nausea, or vomiting.Patient is being followed by her PCP Dr. Hilma Favors who has been doing her blood work. Also the hematologist oncologist has been following her for her thrombocytopenia. Her PCP has been doing her blood work. Patient stated that her last colonoscopy was in 2012 benign polyps of been removed and she is on a 5 year cycle. She also suffers from gastritis. Patient states all her vaccines are up-to-date.  Patient has history of osteopenia her last bone density study in 2014 demonstrating her lowest T score of -1.5 at the distal one third left forearm her left hip score T. score was -1.1 and she had a normal FRAX analysis. She did have a traumatic fall in 2012 resulting in a back and right ankle fracture. She is taking her calcium and vitamin D. Patient has had a history of transvaginal hysterectomy in the past. Patient denied any prior history of abnormal Pap smear.    Past medical history,surgical history, family history and social history were all reviewed and documented in the EPIC chart.  Gynecologic History No LMP recorded. Patient is postmenopausal. Contraception: status post hysterectomy Last Pap: 2012. Results were: normal Last mammogram: 2015. Results were: normal  Obstetric History OB History  Gravida Para Term Preterm AB SAB TAB Ectopic Multiple Living  2 2 2       2     # Outcome Date GA Lbr Len/2nd Weight Sex Delivery Anes PTL Lv  2 Term     F Vag-Spont  N Y  1 Term     M Vag-Spont  N Y       ROS: A ROS was performed and pertinent positives and negatives are included in the history.  GENERAL: No fevers or chills. HEENT: No change in vision, no earache, sore throat or sinus congestion. NECK: No pain or stiffness. CARDIOVASCULAR: No chest pain or pressure. No palpitations.  PULMONARY: No shortness of breath, cough or wheeze. GASTROINTESTINAL: No abdominal pain, nausea, vomiting or diarrhea, melena or bright red blood per rectum. GENITOURINARY: No urinary frequency, urgency, hesitancy or dysuria. MUSCULOSKELETAL: No joint or muscle pain, no back pain, no recent trauma. DERMATOLOGIC: No rash, no itching, no lesions. ENDOCRINE: No polyuria, polydipsia, no heat or cold intolerance. No recent change in weight. HEMATOLOGICAL: No anemia or easy bruising or bleeding. NEUROLOGIC: No headache, seizures, numbness, tingling or weakness. PSYCHIATRIC: No depression, no loss of interest in normal activity or change in sleep pattern.     Exam: chaperone present  BP 126/78 mmHg  Ht 5\' 1"  (1.549 m)  Wt 225 lb (102.059 kg)  BMI 42.54 kg/m2  Body mass index is 42.54 kg/(m^2).  General appearance : Well developed well nourished female. No acute distress HEENT: Eyes: no retinal hemorrhage or exudates,  Neck supple, trachea midline, no carotid bruits, no thyroidmegaly Lungs: Clear to auscultation, no rhonchi or wheezes, or rib retractions  Heart: Regular rate and rhythm, no murmurs or gallops Breast:Examined in sitting and supine position were symmetrical in appearance, no palpable masses or tenderness,  no skin retraction, no nipple inversion, no nipple discharge, no skin discoloration, no axillary or supraclavicular lymphadenopathy Abdomen: no palpable masses or tenderness, no rebound or guarding Extremities: no edema or skin discoloration or tenderness  Pelvic:  Bartholin, Urethra, Skene Glands: Within normal limits  Vagina: No gross lesions or discharge, first-degree cystocele  Cervix: Absent  Uterus absental   Rectovaginal  normal sphincter tone without palpated masses or tenderness             HemocculPCP provides     her screening urinalysis was negative   Assessment/Plan:  74 y.o. female for annualWho is overweight. She was weighing 231 pounds last year and  is down to 225 pounds. Although she's having dysuria and frequency and her urinalysis was negative were going to ask them to run a urine culture. Patient really attributes to a smell in her urine more than true dysuria. Pap smear no longer needed according to the new guidelines. Patient to schedule bone density study here in the office the next few weeks. She also is due for her mammogram for which requisition was provided. Her PCP will be doing her blood work.   Terrance Mass MD, 12:41 PM 07/27/2014

## 2014-07-29 LAB — URINE CULTURE: Colony Count: 1000

## 2014-08-15 ENCOUNTER — Telehealth: Payer: Self-pay | Admitting: Orthopedic Surgery

## 2014-08-16 ENCOUNTER — Other Ambulatory Visit: Payer: Self-pay | Admitting: Gynecology

## 2014-08-16 ENCOUNTER — Ambulatory Visit (INDEPENDENT_AMBULATORY_CARE_PROVIDER_SITE_OTHER): Payer: Medicare Other

## 2014-08-16 DIAGNOSIS — M8580 Other specified disorders of bone density and structure, unspecified site: Secondary | ICD-10-CM

## 2014-08-16 DIAGNOSIS — Z13820 Encounter for screening for osteoporosis: Secondary | ICD-10-CM

## 2014-08-16 DIAGNOSIS — N63 Unspecified lump in breast: Secondary | ICD-10-CM | POA: Diagnosis not present

## 2014-08-17 ENCOUNTER — Encounter: Payer: Self-pay | Admitting: Gynecology

## 2014-08-18 ENCOUNTER — Other Ambulatory Visit: Payer: Self-pay | Admitting: Orthopedic Surgery

## 2014-08-18 NOTE — Telephone Encounter (Signed)
Routing to Carol Foltz 

## 2014-08-18 NOTE — Telephone Encounter (Signed)
Patient aware - [facility] pharmacy to fax request - I confirmed with pharmacy to manually fax Vs. Electronic, per Estill Bamberg.

## 2014-08-18 NOTE — Telephone Encounter (Signed)
PATIENT IS CALLING STATING THAT [facility] IN [place] AS NOT RECEIVED A REFILL REQUEST FROM [place] FOR M[facility] [place] gabapentin (NEURONTIN) 100 MG capsule PLEASE ADVISE?

## 2014-08-19 ENCOUNTER — Other Ambulatory Visit: Payer: Self-pay | Admitting: *Deleted

## 2014-08-19 MED ORDER — GABAPENTIN 100 MG PO CAPS: 100.0000 mg | ORAL_CAPSULE | Freq: Three times a day (TID) | ORAL | Status: DC

## 2014-09-27 DIAGNOSIS — M5410 Radiculopathy, site unspecified: Secondary | ICD-10-CM | POA: Diagnosis not present

## 2014-09-27 DIAGNOSIS — M5136 Other intervertebral disc degeneration, lumbar region: Secondary | ICD-10-CM | POA: Diagnosis not present

## 2014-09-27 DIAGNOSIS — Z1389 Encounter for screening for other disorder: Secondary | ICD-10-CM | POA: Diagnosis not present

## 2014-10-18 DIAGNOSIS — E782 Mixed hyperlipidemia: Secondary | ICD-10-CM | POA: Diagnosis not present

## 2014-10-18 DIAGNOSIS — R7309 Other abnormal glucose: Secondary | ICD-10-CM | POA: Diagnosis not present

## 2014-10-18 DIAGNOSIS — Z0000 Encounter for general adult medical examination without abnormal findings: Secondary | ICD-10-CM | POA: Diagnosis not present

## 2014-10-18 DIAGNOSIS — Z1389 Encounter for screening for other disorder: Secondary | ICD-10-CM | POA: Diagnosis not present

## 2014-10-18 DIAGNOSIS — Z6841 Body Mass Index (BMI) 40.0 and over, adult: Secondary | ICD-10-CM | POA: Diagnosis not present

## 2014-11-17 DIAGNOSIS — Z23 Encounter for immunization: Secondary | ICD-10-CM | POA: Diagnosis not present

## 2014-11-25 DIAGNOSIS — M25561 Pain in right knee: Secondary | ICD-10-CM | POA: Diagnosis not present

## 2014-11-25 DIAGNOSIS — M25562 Pain in left knee: Secondary | ICD-10-CM | POA: Diagnosis not present

## 2014-11-25 DIAGNOSIS — M25551 Pain in right hip: Secondary | ICD-10-CM | POA: Diagnosis not present

## 2014-12-14 ENCOUNTER — Encounter (HOSPITAL_COMMUNITY): Payer: Medicare Other | Attending: Hematology & Oncology

## 2014-12-14 DIAGNOSIS — D472 Monoclonal gammopathy: Secondary | ICD-10-CM

## 2014-12-14 DIAGNOSIS — K7460 Unspecified cirrhosis of liver: Secondary | ICD-10-CM | POA: Diagnosis not present

## 2014-12-14 DIAGNOSIS — D696 Thrombocytopenia, unspecified: Secondary | ICD-10-CM | POA: Insufficient documentation

## 2014-12-14 LAB — COMPREHENSIVE METABOLIC PANEL
ALBUMIN: 3.2 g/dL — AB (ref 3.5–5.0)
ALK PHOS: 126 U/L (ref 38–126)
ALT: 30 U/L (ref 14–54)
AST: 42 U/L — ABNORMAL HIGH (ref 15–41)
Anion gap: 5 (ref 5–15)
BUN: 14 mg/dL (ref 6–20)
CALCIUM: 8.8 mg/dL — AB (ref 8.9–10.3)
CO2: 26 mmol/L (ref 22–32)
Chloride: 107 mmol/L (ref 101–111)
Creatinine, Ser: 0.64 mg/dL (ref 0.44–1.00)
GFR calc Af Amer: >60 mL/min (ref >60)
GLUCOSE: 101 mg/dL — AB (ref 65–99)
Potassium: 4.1 mmol/L (ref 3.5–5.1)
Sodium: 138 mmol/L (ref 135–145)
Total Bilirubin: 0.6 mg/dL (ref 0.3–1.2)
Total Protein: 7 g/dL (ref 6.5–8.1)

## 2014-12-14 LAB — CBC WITH DIFFERENTIAL/PLATELET
BASOS PCT: 0 %
Basophils Absolute: 0 10*3/uL (ref 0.0–0.1)
Eosinophils Absolute: 0.1 10*3/uL (ref 0.0–0.7)
Eosinophils Relative: 2 %
HEMATOCRIT: 37.8 % (ref 36.0–46.0)
HEMOGLOBIN: 12.5 g/dL (ref 12.0–15.0)
LYMPHS ABS: 1.5 10*3/uL (ref 0.7–4.0)
LYMPHS PCT: 29 %
MCH: 32.9 pg (ref 26.0–34.0)
MCHC: 33.1 g/dL (ref 30.0–36.0)
MCV: 99.5 fL (ref 78.0–100.0)
MONO ABS: 0.5 10*3/uL (ref 0.1–1.0)
Monocytes Relative: 10 %
NEUTROS ABS: 2.9 10*3/uL (ref 1.7–7.7)
NEUTROS PCT: 58 %
Platelets: DECREASED 10*3/uL (ref 150–400)
RBC: 3.8 MIL/uL — ABNORMAL LOW (ref 3.87–5.11)
RDW: 13 % (ref 11.5–15.5)
WBC: 5 10*3/uL (ref 4.0–10.5)

## 2014-12-15 ENCOUNTER — Telehealth: Payer: Self-pay | Admitting: Gastroenterology

## 2014-12-15 ENCOUNTER — Encounter: Payer: Self-pay | Admitting: Gastroenterology

## 2014-12-15 ENCOUNTER — Ambulatory Visit: Payer: Medicare Other | Admitting: Gastroenterology

## 2014-12-15 NOTE — Telephone Encounter (Signed)
Letter mailed

## 2014-12-15 NOTE — Telephone Encounter (Signed)
Pt was a no show

## 2014-12-15 NOTE — Progress Notes (Signed)
Labs drawn

## 2014-12-16 ENCOUNTER — Other Ambulatory Visit (HOSPITAL_COMMUNITY): Payer: Medicare Other

## 2014-12-23 ENCOUNTER — Encounter (HOSPITAL_BASED_OUTPATIENT_CLINIC_OR_DEPARTMENT_OTHER): Payer: Medicare Other | Admitting: Hematology & Oncology

## 2014-12-23 ENCOUNTER — Encounter (HOSPITAL_BASED_OUTPATIENT_CLINIC_OR_DEPARTMENT_OTHER): Payer: Medicare Other

## 2014-12-23 ENCOUNTER — Encounter (HOSPITAL_COMMUNITY): Payer: Self-pay | Admitting: Hematology & Oncology

## 2014-12-23 VITALS — BP 107/49 | HR 73 | Temp 98.0°F | Resp 18 | Wt 235.2 lb

## 2014-12-23 DIAGNOSIS — D241 Benign neoplasm of right breast: Secondary | ICD-10-CM | POA: Diagnosis not present

## 2014-12-23 DIAGNOSIS — D472 Monoclonal gammopathy: Secondary | ICD-10-CM

## 2014-12-23 DIAGNOSIS — K7460 Unspecified cirrhosis of liver: Secondary | ICD-10-CM | POA: Diagnosis not present

## 2014-12-23 DIAGNOSIS — D691 Qualitative platelet defects: Secondary | ICD-10-CM | POA: Diagnosis not present

## 2014-12-23 DIAGNOSIS — R161 Splenomegaly, not elsewhere classified: Secondary | ICD-10-CM

## 2014-12-23 DIAGNOSIS — D696 Thrombocytopenia, unspecified: Secondary | ICD-10-CM | POA: Diagnosis not present

## 2014-12-23 LAB — CBC WITH DIFFERENTIAL/PLATELET
BASOS ABS: 0 10*3/uL (ref 0.0–0.1)
Basophils Relative: 0 %
EOS ABS: 0.1 10*3/uL (ref 0.0–0.7)
EOS PCT: 3 %
HEMATOCRIT: 35.2 % — AB (ref 36.0–46.0)
Hemoglobin: 11.5 g/dL — ABNORMAL LOW (ref 12.0–15.0)
Lymphocytes Relative: 26 %
Lymphs Abs: 1.1 10*3/uL (ref 0.7–4.0)
MCH: 33 pg (ref 26.0–34.0)
MCHC: 32.7 g/dL (ref 30.0–36.0)
MCV: 100.9 fL — ABNORMAL HIGH (ref 78.0–100.0)
MONO ABS: 0.5 10*3/uL (ref 0.1–1.0)
MONOS PCT: 13 %
Neutro Abs: 2.4 10*3/uL (ref 1.7–7.7)
Neutrophils Relative %: 59 %
PLATELETS: ADEQUATE 10*3/uL (ref 150–400)
RBC: 3.49 MIL/uL — ABNORMAL LOW (ref 3.87–5.11)
RDW: 12.9 % (ref 11.5–15.5)
WBC: 4.2 10*3/uL (ref 4.0–10.5)

## 2014-12-23 NOTE — Patient Instructions (Signed)
[place] at [facility] Discharge Instructions  RECOMMENDATIONS MADE BY THE CONSULTANT AND ANY TEST RESULTS WILL BE SENT TO YOUR REFERRING PHYSICIAN.  Exam and discussion by Dr. Whitney Muse Need to recheck your blood work to see what your platelet count is. Report unusual bruising or bleeding.  Labs and office visit in 6 months.  Thank you for choosing [place] at [facility] to provide your oncology and hematology care.  To afford each patient quality time with our provider, please arrive at least 15 minutes before your scheduled appointment time.    You need to re-schedule your appointment should you arrive 10 or more minutes late.  We strive to give you quality time with our providers, and arriving late affects you and other patients whose appointments are after yours.  Also, if you no show three or more times for appointments you may be dismissed from the clinic at the providers discretion.     Again, thank you for choosing [facility].  Our hope is that these requests will decrease the amount of time that you wait before being seen by our physicians.       _____________________________________________________________  Should you have questions after your visit to [facility], please contact our office at [phone] between the hours of 8:30 a.m. and 4:30 p.m.  Voicemails left after 4:30 p.m. will not be returned until the following business day.  For prescription refill requests, have your pharmacy contact our office.

## 2014-12-23 NOTE — Progress Notes (Signed)
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 female returns for  regular  visit for followup of thrombocytopenia in the setting of cirrhosis of liver and splenomegaly with also some intra-abdominal lymphadenopathy.    She denies any B. symptomatology including fevers, chills, night sweats, unintentional weight loss, and decreased appetite. She denies any early satiety.  Hematologically, the patient denies any complaints and ROS questioning is negative.   Melanie Porter is here alone today. The patient presents in a wheelchair. She is here to discuss her blood results. She has been feeling well, other than some knee and ankle pain. This pain is chronic. She remarks she has been eating 'too good'.   She walks at home, sometimes staggering. She denies falling. She lives with her husband who is not very healthy. Her son and daughter come by to check on them regularly. She notes that her daughter comes by more frequently.  She does not follow with a cardiologist. Last mammogram was in May. She had an ultrasound at that time and was diagnosed with a calcified fibroadenoma.  She gets her breast imaging at [facility].   Past Medical History  Diagnosis Date  . Depression   . Anxiety   . Arthritis   . Obesity   . Hypercholesteremia   . Fibromyalgia   . GERD (gastroesophageal reflux disease)   . Back fracture 05/21/10  . Knee fracture, right 05/21/10  . Ankle fracture, right 05/21/10  . Thrombocytopenia 02/19/2012  . 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 Splenomegaly 09/23/2012  . 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     has No Known Allergies.  Melanie Porter does not currently have medications on file.  Past Surgical History  Procedure Laterality Date  . Breast biopsy  1982    left benign  . Partial hysterectomy    . Esophagogastroduodenoscopy  03/02/2012    Dr. Oneida Alar:The mucosa of the esophagus appeared normal Multiple non-bleeding ulcers, ranging between 3-40mm in size were found in the gastric antrum  The duodenal mucosa showed no abnormalities in the bulb and second portion of the duodenum. path negative for H.pylori  . Esophagogastroduodenoscopy N/A 07/15/2014    SLF: 1. No esophageal, gastric, or duodenal varices 2. small 1-2 mm gastric ulcer  . Abdominal hysterectomy      Denies any headaches, dizziness, double vision, fevers, chills, night sweats, nausea, vomiting, diarrhea, constipation, chest pain, heart palpitations, shortness of breath, blood in stool, black tarry stool, urinary pain, urinary burning, urinary frequency, hematuria. Positive for knee and ankle pain.     Chronic  PHYSICAL EXAMINATION  ECOG PERFORMANCE STATUS: 2 - Symptomatic, <50% confined to bed  Filed Vitals:   12/23/14 1115  BP: 107/49  Pulse: 73  Temp: 98 F (36.7 C)  Resp: 18    GENERAL:alert, no distress, well nourished, well developed, comfortable, cooperative, obese  and smiling SKIN: skin color, texture, turgor are normal, no rashes or significant lesions HEAD: Normocephalic, No masses, lesions, tenderness or abnormalities EYES: normal, Conjunctiva are pink and non-injected EARS: External ears  normal OROPHARYNX:mucous membranes are moist  NECK: supple, no adenopathy, thyroid normal size, non-tender, without nodularity, no stridor, non-tender, trachea midline LYMPH:  no palpable lymphadenopathy, no hepatosplenomegaly BREAST:breasts appear normal, no suspicious masses, no skin or nipple changes or axillary nodes, B/L fibroglandular tissue in breast, R breast nodule noted and mobile (evaluated as above in HPI) LUNGS: clear to auscultation and percussion  Expiratory wheezing HEART: regular rate & rhythm, no murmurs, no gallops, S1 normal and S2 normal ABDOMEN:abdomen soft, non-tender, obese, normal bowel sounds, no masses or organomegaly, difficult to assess for hepatosplenomegaly due to body habitus and no hepatosplenomegaly BACK: Back symmetric, no curvature., No CVA tenderness EXTREMITIES:less then 2 second capillary refill, no joint deformities, effusion, or inflammation, no edema, no skin discoloration, no clubbing, no cyanosis  NEURO: alert & oriented x 3 with fluent speech, no focal motor/sensory deficits, uses a cane for stability when ambulating    LABORATORY DATA: I have reviewed the labs below CBC    Component Value Date/Time   WBC 5.0 12/14/2014 1238   RBC 3.80* 12/14/2014 1238   HGB 12.5 12/14/2014 1238   HCT 37.8 12/14/2014 1238   PLT  12/14/2014 1238    PLATELET CLUMPS NOTED ON SMEAR, COUNT APPEARS DECREASED   MCV 99.5 12/14/2014 1238   MCH 32.9 12/14/2014 1238   MCHC 33.1 12/14/2014 1238   RDW 13.0 12/14/2014 1238   LYMPHSABS 1.5 12/14/2014 1238   MONOABS 0.5 12/14/2014 1238   EOSABS 0.1 12/14/2014 1238   BASOSABS 0.0 12/14/2014 1238      Chemistry      Component Value Date/Time   NA 138 12/14/2014 1238   K 4.1 12/14/2014 1238   CL 107 12/14/2014 1238   CO2 26 12/14/2014 1238   BUN 14 12/14/2014 1238   CREATININE 0.64 12/14/2014 1238   CREATININE 0.70 06/08/2014 1510      Component Value Date/Time   CALCIUM 8.8* 12/14/2014 1238   ALKPHOS 126  12/14/2014 1238   ALKPHOS 146 09/16/2013   AST 42* 12/14/2014 1238   AST 44 09/16/2013   ALT 30 12/14/2014 1238   ALT 39 09/16/2013   BILITOT 0.6 12/14/2014 1238   BILITOT 0.6 09/16/2013       RADIOGRAPHIC STUDIES:  CLINICAL DATA: Cirrhosis. Obesity.  EXAM: US ABDOMEN LIMITED - RIGHT UPPER QUADRANT  COMPARISON: Ultrasound 11/01/2013. CT 03/29/2013.  FINDINGS: Gallbladder:  No gallstones or wall thickening visualized. No sonographic Murphy sign noted.  Common bile duct:  Diameter: 4.3 mm.  Liver:  Liver has a nodular coarse echotexture consistent with patient's known cirrhosis. No focal hepatic abnormality.  IMPRESSION: Coarse nodular liver consistent with known cirrhosis. No focal hepatic abnormality. Exam is otherwise unremarkable. No gallstones or biliary distention.   Electronically Signed  By: Marcello Moores Register  On: 06/10/2014 10:03   ASSESSMENT:  1. Thrombocytopenia in setting of liver cirrhosis and splenomegaly 2. Cirrhosis of liver 3. Splenomegaly 4. IgM Kappa monoclonal protein, MGUS 5. Calcified fibroadenoma R breast   THERAPY PLAN:   I have recommended ongoing observation with follow-up again in 6 months. Counts have remained stable. She is a very small M spike and her SPEP has been stable.  She is to continue with her screening mammograms.   We will call with her platelet levels from today.  I will see  her again in 6 months for routine follow up.  All questions were answered. The patient knows to call the clinic with any problems, questions or concerns. We can certainly see the patient much sooner if necessary.  This note was signed electronically  This document serves as a record of services personally performed by Ancil Linsey, MD. It was created on her behalf by Arlyce Harman, a trained medical scribe. The creation of this record is based on the scribe's personal observations and the provider's statements to them.  This document has been checked and approved by the attending provider.  I have reviewed the above documentation for accuracy and completeness, and I agree with the above.  Kelby Fam. Whitney Muse, MD

## 2014-12-23 NOTE — Progress Notes (Signed)
LABS DRAWN

## 2015-01-01 ENCOUNTER — Encounter (HOSPITAL_COMMUNITY): Payer: Self-pay | Admitting: Hematology & Oncology

## 2015-01-11 ENCOUNTER — Ambulatory Visit (INDEPENDENT_AMBULATORY_CARE_PROVIDER_SITE_OTHER): Payer: Medicare Other | Admitting: Cardiology

## 2015-01-11 ENCOUNTER — Encounter: Payer: Self-pay | Admitting: Cardiology

## 2015-01-11 VITALS — BP 124/72 | HR 79 | Ht 62.0 in | Wt 231.2 lb

## 2015-01-11 DIAGNOSIS — R0989 Other specified symptoms and signs involving the circulatory and respiratory systems: Secondary | ICD-10-CM | POA: Diagnosis not present

## 2015-01-11 DIAGNOSIS — I350 Nonrheumatic aortic (valve) stenosis: Secondary | ICD-10-CM | POA: Diagnosis not present

## 2015-01-11 DIAGNOSIS — Z136 Encounter for screening for cardiovascular disorders: Secondary | ICD-10-CM

## 2015-01-11 NOTE — Patient Instructions (Addendum)
Medication Instructions:  Your physician recommends that you continue on your current medications as directed. Please refer to the Current Medication list given to you today.  Labwork: None ordered  Testing/Procedures: None ordered  Follow-Up: Your physician wants you to follow-up in: 1 year with Dr. Harl Bowie. You will receive a reminder letter in the mail two months in advance. If you don't receive a letter, please call our office to schedule the follow-up appointment.   Any Other Special Instructions Will Be Listed Below (If Applicable). Thank you for choosing [facility]!!

## 2015-01-11 NOTE — Progress Notes (Signed)
Patient ID: Kathrin Ruddy, female   DOB: 1940/09/25, 74 y.o.   MRN: [id number]     Clinical Summary Ms. Emmer is a 74 y.o.female seen today for follow up of the following medical problems.   1. Aortic stenosis - mild by echo 06/2013 - denies any SOB, no DOE, no chest pain. No syncope  2. NASH cirrhosis - followed by GI  3. MGUS - followed by heme/onc  Past Medical History  Diagnosis Date  . Depression   . Anxiety   . Arthritis   . Obesity   . Hypercholesteremia   . Fibromyalgia   . GERD (gastroesophageal reflux disease)   . Back fracture 05/21/10  . Knee fracture, right 05/21/10  . Ankle fracture, right 05/21/10  . Thrombocytopenia ([place]) 02/19/2012  . 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 Splenomegaly 09/23/2012  . 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 bisacodyl (DULCOLAX) 5 MG EC tablet Take 10 mg by mouth daily as needed. Constipation    . Calcium Carbonate-Vit D-Min (CALTRATE PLUS PO) Take 1 tablet by mouth daily.     . Cyanocobalamin (VITAMIN B 12 PO) Take 2,000 mcg by mouth daily.    . Diclofenac Sodium CR 100 MG 24 hr tablet Take 100 mg by mouth daily.  0  . docusate sodium (COLACE) 100 MG capsule Take 100 mg by mouth at bedtime. Takes 2 at bedtime    . Flaxseed, Linseed, (FLAXSEED OIL) 1000 MG CAPS Take 1,000 mg by mouth 2 (two) times daily.     Marland Kitchen FLUoxetine (PROZAC) 40 MG capsule Take 40 mg by mouth daily.      . furosemide (LASIX) 20 MG tablet Take 1 tablet (20 mg total) by mouth daily. Take prn as needed for swelling 90 tablet 3  . gabapentin (NEURONTIN) 100 MG capsule Take 1 capsule (100 mg total) by mouth 3 (three) times daily. 60 capsule 1  . loratadine (CLARITIN) 10 MG tablet Take 10 mg by mouth as needed for  allergies.    . Multiple Vitamins-Minerals (MULTIVITAMIN GUMMIES ADULT PO) Take 2 each by mouth daily. Vitafusion Women's complete multivitamin gummy    . omeprazole (PRILOSEC) 20 MG capsule Take 20 mg by mouth daily.      Marland Kitchen oxybutynin (DITROPAN-XL) 5 MG 24 hr tablet Take 5 mg by mouth at bedtime.    Marland Kitchen Propylene Glycol (SYSTANE BALANCE) 0.6 % SOLN Apply 1 drop to eye daily as needed. Dry Eyes    . psyllium (METAMUCIL) 58.6 % powder Take 1 packet by mouth as needed.    . simvastatin (ZOCOR) 40 MG tablet Take 40 mg by mouth every evening.    . traMADol (ULTRAM-ER) 100 MG 24 hr tablet Take 100 mg by mouth daily.    . traZODone (DESYREL) 100 MG tablet Take 100 mg by mouth at bedtime. Takes 1/2 tablet at bedtime.    . trolamine salicylate (ASPERCREME) 10 % cream Apply topically as needed.     No current facility-administered medications for this visit.     Past Surgical History  Procedure Laterality Date  . Breast biopsy  1982    left benign  . Partial hysterectomy    . Esophagogastroduodenoscopy  03/02/2012    Dr. Oneida Alar:The  mucosa of the esophagus appeared normal Multiple non-bleeding ulcers, ranging between 3-56mm in size were found in the gastric antrum  The duodenal mucosa showed no abnormalities in the bulb and second portion of the duodenum. path negative for H.pylori  . Esophagogastroduodenoscopy N/A 07/15/2014    SLF: 1. No esophageal, gastric, or duodenal varices 2. small 1-2 mm gastric ulcer  . Abdominal hysterectomy       No Known Allergies    Family History  Problem Relation Age of Onset  . Heart disease Father   . Heart attack Father   . Diabetes Sister   . Hypertension Sister   . Heart disease Sister   . Hypertension Brother   . Heart disease Brother   . Colon cancer Neg Hx   . Aneurysm Mother      Social History Ms. Brensinger reports that she has never smoked. She has never used smokeless tobacco. Ms. Boden reports that she does not drink alcohol.   Review of  Systems CONSTITUTIONAL: No weight loss, fever, chills, weakness or fatigue.  HEENT: Eyes: No visual loss, blurred vision, double vision or yellow sclerae.No hearing loss, sneezing, congestion, runny nose or sore throat.  SKIN: No rash or itching.  CARDIOVASCULAR: per HPI RESPIRATORY: No shortness of breath, cough or sputum.  GASTROINTESTINAL: No anorexia, nausea, vomiting or diarrhea. No abdominal pain or blood.  GENITOURINARY: No burning on urination, no polyuria NEUROLOGICAL: No headache, dizziness, syncope, paralysis, ataxia, numbness or tingling in the extremities. No change in bowel or bladder control.  MUSCULOSKELETAL: No muscle, back pain, joint pain or stiffness.  LYMPHATICS: No enlarged nodes. No history of splenectomy.  PSYCHIATRIC: No history of depression or anxiety.  ENDOCRINOLOGIC: No reports of sweating, cold or heat intolerance. No polyuria or polydipsia.  Marland Kitchen   Physical Examination Filed Vitals:   01/11/15 1431  BP: 124/72  Pulse: 79   Filed Vitals:   01/11/15 1431  Height: 5\' 2"  (1.575 m)  Weight: 231 lb 3.2 oz (104.872 kg)    Gen: resting comfortably, no acute distress HEENT: no scleral icterus, pupils equal round and reactive, no palptable cervical adenopathy,  CV: RRR, 2/6 systolic murmur RUSB, no jvd. Bilateral carotid bruits Resp: Clear to auscultation bilaterally GI: abdomen is soft, non-tender, non-distended, normal bowel sounds, no hepatosplenomegaly MSK: extremities are warm, no edema.  Skin: warm, no rash Neuro:  no focal deficits Psych: appropriate affect   Diagnostic Studies 11/19/13 echo Left ventricle: Systolic function was normal. The estimated ejection fraction was in the range of 60% to 65%. Wall motion was normal; there were no regional wall motion abnormalities. Doppler parameters are consistent with abnormal left ventricular relaxation (grade 1 diastolic dysfunction). - Aortic valve: Mildly calcified annulus. Trileaflet;  moderately calcified leaflets. Cusp separation was reduced. There was mild to moderate stenosis. Mean gradient (S): 14 mm Hg. Peak gradient (S): 30 mm Hg. VTI ratio of LVOT to aortic valve: 0.39. Valve area (VTI): 1.56 cm^2. Valve area (Vmax): 1.43 cm^2. - Mitral valve: Calcified annulus. There was mild regurgitation. - Right atrium: Central venous pressure (est): 3 mm Hg. - Tricuspid valve: There was mild regurgitation. - Pulmonary arteries: PA peak pressure: 37 mm Hg (S). - Pericardium, extracardiac: There was no pericardial effusion.  Impressions:  - Normal LV wall thickness and chamber size with LVEF 22-97%, grade 1 diastolic dysfunction. MAC with mild mitral regurgitation. Mild to moderate calcific aortic stenosis as outlined above. Mild tricuspid regurgitation with PASP 37 mmHg.  06/2013 Carotid US IMPRESSION:  Minimal atherosclerotic disease in the carotid arteries. No  significant stenosis. Estimated degree of stenosis in the internal  carotid arteries is less than 50% bilaterally.     Assessment and Plan   1. Aortic stenosis - mild by recent echo, no significant symptoms - continue to follow clinically  2. Carotid bruits - recent carotid US without significant stenosis, continue to follow clinically    F/u 1 year  Arnoldo Lenis, M.D.

## 2015-02-13 ENCOUNTER — Encounter: Payer: Self-pay | Admitting: Gastroenterology

## 2015-03-23 ENCOUNTER — Other Ambulatory Visit: Payer: Self-pay

## 2015-03-23 ENCOUNTER — Ambulatory Visit (INDEPENDENT_AMBULATORY_CARE_PROVIDER_SITE_OTHER): Payer: Medicare Other | Admitting: Gastroenterology

## 2015-03-23 ENCOUNTER — Encounter: Payer: Self-pay | Admitting: Gastroenterology

## 2015-03-23 VITALS — BP 129/73 | HR 90 | Temp 97.4°F | Ht 62.0 in | Wt 230.6 lb

## 2015-03-23 DIAGNOSIS — K219 Gastro-esophageal reflux disease without esophagitis: Secondary | ICD-10-CM | POA: Diagnosis not present

## 2015-03-23 DIAGNOSIS — K7469 Other cirrhosis of liver: Secondary | ICD-10-CM | POA: Diagnosis not present

## 2015-03-23 DIAGNOSIS — K7460 Unspecified cirrhosis of liver: Secondary | ICD-10-CM

## 2015-03-23 MED ORDER — PANTOPRAZOLE SODIUM 40 MG PO TBEC: 40.0000 mg | DELAYED_RELEASE_TABLET | Freq: Every day | ORAL | Status: AC

## 2015-03-23 NOTE — Progress Notes (Signed)
Referring Provider: Sharilyn Sites, MD Primary Care Physician:  [name], MD  Primary GI: Dr. Oneida Alar   Chief Complaint  Patient presents with  . Follow-up    HPI:   [name] is a 74 y.o. female presenting today with a history of NASH cirrhosis. PUD in remote past with recent EGD completed noting a small non-bleeding round and deep ulcer in the gastric body. Negative H.pylori. No varices. Last colonoscopy normal in 2010 and due for routine screening in 2020.   Takes medicine for reflux. States throat burns a lot. Prilosec once daily. Can't eat anything fried. Likes bacon and sausage. No problems with lower extremity edema, has lasix to take as needed. States throat has a funny feeling now. Feels like she is hoarse.   Past Medical History  Diagnosis Date  . Depression   . Anxiety   . Arthritis   . Obesity   . Hypercholesteremia   . Fibromyalgia   . GERD (gastroesophageal reflux disease)   . Back fracture 05/21/10  . Knee fracture, right 05/21/10  . Ankle fracture, right 05/21/10  . Thrombocytopenia ([place]) 02/19/2012  . 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 Splenomegaly 09/23/2012  . 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 Breast biopsy  1982    left benign  . Partial hysterectomy    . Esophagogastroduodenoscopy  03/02/2012    Dr. Oneida Alar:The mucosa of the esophagus appeared normal Multiple non-bleeding ulcers, ranging between 3-52mm in size were found in the gastric antrum  The duodenal mucosa showed no abnormalities in the bulb and second portion of the duodenum. path negative for H.pylori  . Esophagogastroduodenoscopy N/A 07/15/2014    SLF: 1. No esophageal, gastric, or duodenal varices 2. small 1-2 mm gastric ulcer. Negative H.pylori   . Abdominal  hysterectomy    . Colonoscopy  June 2010    Dr. Collene Mares: few scattered sigmoid diverticula, otherwise normal. Screening in 2020     Current Outpatient Prescriptions  Medication Sig Dispense Refill  . bisacodyl (DULCOLAX) 5 MG EC tablet Take 10 mg by mouth daily as needed. Constipation    . Calcium Carbonate-Vit D-Min (CALTRATE PLUS PO) Take 1 tablet by mouth daily.     . Cyanocobalamin (VITAMIN B 12 PO) Take 2,000 mcg by mouth daily.    . Diclofenac Sodium CR 100 MG 24 hr tablet Take 100 mg by mouth daily.  0  . docusate sodium (COLACE) 100 MG capsule Take 100 mg by mouth at bedtime. Takes 2 at bedtime    . Flaxseed, Linseed, (FLAXSEED OIL) 1000 MG CAPS Take 1,000 mg by mouth 2 (two) times daily.     Marland Kitchen FLUoxetine (PROZAC) 40 MG capsule Take 40 mg by mouth daily.      . furosemide (LASIX) 20 MG tablet Take 1 tablet (20 mg total) by mouth daily. Take prn as needed for swelling 90 tablet 3  . gabapentin (NEURONTIN) 100 MG capsule Take 1 capsule (100 mg total) by mouth 3 (three) times daily. 60 capsule 1  . loratadine (CLARITIN) 10 MG tablet Take 10 mg by mouth as needed for allergies.    . Multiple Vitamins-Minerals (MULTIVITAMIN GUMMIES ADULT PO) Take 2 each by mouth daily. Vitafusion Women's complete multivitamin gummy    .  omeprazole (PRILOSEC) 20 MG capsule Take 20 mg by mouth daily.      Marland Kitchen oxybutynin (DITROPAN-XL) 5 MG 24 hr tablet Take 5 mg by mouth at bedtime.    Marland Kitchen Propylene Glycol (SYSTANE BALANCE) 0.6 % SOLN Apply 1 drop to eye daily as needed. Dry Eyes    . psyllium (METAMUCIL) 58.6 % powder Take 1 packet by mouth as needed.    . simvastatin (ZOCOR) 40 MG tablet Take 40 mg by mouth every evening.    . traMADol (ULTRAM-ER) 100 MG 24 hr tablet Take 100 mg by mouth daily.    . traZODone (DESYREL) 100 MG tablet Take 100 mg by mouth at bedtime. Takes 1/2 tablet at bedtime.    . trolamine salicylate (ASPERCREME) 10 % cream Apply topically as needed.    . pantoprazole (PROTONIX) 40 MG tablet  Take 1 tablet (40 mg total) by mouth daily. 90 tablet 3   No current facility-administered medications for this visit.    Allergies as of 03/23/2015  . (No Known Allergies)    Family History  Problem Relation Age of Onset  . Heart disease Father   . Heart attack Father   . Diabetes Sister   . Hypertension Sister   . Heart disease Sister   . Hypertension Brother   . Heart disease Brother   . Colon cancer Neg Hx   . Aneurysm Mother     Social History   Social History  . Marital Status: Married    Spouse Name: N/A  . Number of Children: N/A  . Years of Education: N/A   Occupational History  . 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 Smoking status: Never Smoker   . Smokeless tobacco: Never Used  . Alcohol Use: No  . Drug Use: No  . Sexual Activity: No   Other Topics Concern  . None   Social History Narrative   HAS A HUSBAND WHO MAY HAVE DEMENTIA AND IS CHALLENGING TO CARE FOR.    Review of Systems: As mentioned in HPI   Physical Exam: BP 129/73 mmHg  Pulse 90  Temp(Src) 97.4 F (36.3 C)  Ht 5\' 2"  (1.575 m)  Wt 230 lb 9.6 oz (104.599 kg)  BMI 42.17 kg/m2 General:   Alert and oriented. No distress noted. Pleasant and cooperative.  Head:  Normocephalic and atraumatic. Abdomen:  +BS, soft, non-tender and non-distended. No rebound or guarding. No HSM or masses noted. Msk:  Symmetrical without gross deformities. Normal posture. Extremities:  Without edema. Neurologic:  Alert and  oriented x4;  grossly normal neurologically. Psych:  Alert and cooperative. Normal mood and affect.

## 2015-03-23 NOTE — Patient Instructions (Signed)
Stop Prilosec. Start taking Protonix once each day, 30 minutes before breakfast. I sent this to your pharmacy.  We have ordered a routine ultrasound of your liver.   Please call me in 2-4 weeks if your throat is not feeling better.   We will see you in 3 months!

## 2015-03-31 ENCOUNTER — Encounter: Payer: Self-pay | Admitting: Gastroenterology

## 2015-03-31 NOTE — Assessment & Plan Note (Signed)
Change from Prilosec to Protonix. If persistent hoarseness, refer to ENT. Patient to call me in 2-4 weeks.

## 2015-03-31 NOTE — Assessment & Plan Note (Signed)
EGD up-to-date for variceal screening. May need repeat EGD due to findings of persistent ulcer. Will discuss with Dr. Oneida Alar. Due for US abdomen now. Return in 6 months.

## 2015-04-04 NOTE — Progress Notes (Signed)
REVIEWED. PT NEEDS TO EAT RIGHT, LOSE WEIGHT, AND TAKE HER MEDICINES. REPEAT EGD WITHIN THE NEXT 1-2 MOS TO ASSESS HEALING.

## 2015-04-04 NOTE — Progress Notes (Signed)
cc'ed to pcp °

## 2015-04-10 NOTE — Progress Notes (Addendum)
Please have patient return in about 4 weeks (cancel the March appt) to see me. We will set up an EGD at that time.

## 2015-04-11 ENCOUNTER — Encounter: Payer: Self-pay | Admitting: Gastroenterology

## 2015-04-11 DIAGNOSIS — H52203 Unspecified astigmatism, bilateral: Secondary | ICD-10-CM | POA: Diagnosis not present

## 2015-04-11 DIAGNOSIS — H25013 Cortical age-related cataract, bilateral: Secondary | ICD-10-CM | POA: Diagnosis not present

## 2015-04-11 DIAGNOSIS — H04123 Dry eye syndrome of bilateral lacrimal glands: Secondary | ICD-10-CM | POA: Diagnosis not present

## 2015-04-11 DIAGNOSIS — H2513 Age-related nuclear cataract, bilateral: Secondary | ICD-10-CM | POA: Diagnosis not present

## 2015-04-11 DIAGNOSIS — H524 Presbyopia: Secondary | ICD-10-CM | POA: Diagnosis not present

## 2015-04-11 NOTE — Progress Notes (Signed)
APPT RESCHEDULED TO [facility] AND LETTER SENT

## 2015-04-12 ENCOUNTER — Ambulatory Visit (HOSPITAL_COMMUNITY)
Admission: RE | Admit: 2015-04-12 | Discharge: 2015-04-12 | Disposition: A | Discharge status: Home / Self Care | Payer: Medicare Other | Source: Ambulatory Visit | Attending: Gastroenterology | Admitting: Gastroenterology

## 2015-04-12 DIAGNOSIS — R161 Splenomegaly, not elsewhere classified: Secondary | ICD-10-CM | POA: Diagnosis not present

## 2015-04-12 DIAGNOSIS — K7460 Unspecified cirrhosis of liver: Secondary | ICD-10-CM | POA: Diagnosis not present

## 2015-04-12 DIAGNOSIS — K7581 Nonalcoholic steatohepatitis (NASH): Secondary | ICD-10-CM | POA: Insufficient documentation

## 2015-04-24 NOTE — Progress Notes (Signed)
Quick Note:  Stable changes of cirrhosis. Repeat in 6 months. ______

## 2015-04-26 NOTE — Progress Notes (Signed)
Quick Note:  LMOM to call. ______ 

## 2015-04-27 NOTE — Progress Notes (Signed)
Quick Note:  Pt is aware. Routing to [place] to nic. ______

## 2015-05-12 ENCOUNTER — Encounter: Payer: Self-pay | Admitting: Gastroenterology

## 2015-05-12 ENCOUNTER — Telehealth: Payer: Self-pay

## 2015-05-12 ENCOUNTER — Other Ambulatory Visit: Payer: Self-pay

## 2015-05-12 ENCOUNTER — Ambulatory Visit (INDEPENDENT_AMBULATORY_CARE_PROVIDER_SITE_OTHER): Payer: Medicare Other | Admitting: Gastroenterology

## 2015-05-12 VITALS — BP 124/72 | HR 74 | Temp 97.3°F | Ht 62.0 in | Wt 232.4 lb

## 2015-05-12 DIAGNOSIS — K279 Peptic ulcer, site unspecified, unspecified as acute or chronic, without hemorrhage or perforation: Secondary | ICD-10-CM | POA: Diagnosis not present

## 2015-05-12 DIAGNOSIS — K7469 Other cirrhosis of liver: Secondary | ICD-10-CM | POA: Diagnosis not present

## 2015-05-12 DIAGNOSIS — K7460 Unspecified cirrhosis of liver: Secondary | ICD-10-CM | POA: Diagnosis not present

## 2015-05-12 LAB — COMPLETE METABOLIC PANEL WITH GFR
ALT: 29 U/L (ref 6–29)
AST: 43 U/L — ABNORMAL HIGH (ref 10–35)
Albumin: 3.4 g/dL — ABNORMAL LOW (ref 3.6–5.1)
Alkaline Phosphatase: 132 U/L — ABNORMAL HIGH (ref 33–130)
BILIRUBIN TOTAL: 0.8 mg/dL (ref 0.2–1.2)
BUN: 8 mg/dL (ref 7–25)
CHLORIDE: 102 mmol/L (ref 98–110)
CO2: 27 mmol/L (ref 20–31)
CREATININE: 0.72 mg/dL (ref 0.60–0.93)
Calcium: 9.2 mg/dL (ref 8.6–10.4)
GFR, Est African American: >89 mL/min (ref >=60)
GFR, Est Non African American: 83 mL/min (ref >=60)
GLUCOSE: 99 mg/dL (ref 65–99)
Potassium: 4.2 mmol/L (ref 3.5–5.3)
Sodium: 140 mmol/L (ref 135–146)
TOTAL PROTEIN: 7 g/dL (ref 6.1–8.1)

## 2015-05-12 NOTE — Progress Notes (Signed)
Referring Provider: Sharilyn Sites, MD Primary Care Physician:  [name], MD  Primary GI: Dr. Oneida Alar   Chief Complaint  Patient presents with  . Follow-up    HPI:   [name] is a 75 y.o. female presenting today with a history of NASH cirrhosis. PUD in remote past with recent EGD completed in April 2016 noting a small 1-2 mm gastric ulcer with negative H.pylori. No varices. She is due for routine screening colonoscopy in 2020. She is due for a surveillance EGD due to history of ulcer. Recent hepatoma screening ultrasound Jan 2017 up-to-date without HCC. She is also overdue for routine cirrhosis labs.   States her throat is sore sometimes. Says she "pees a lot". Nocturnal urination. Takes diclofenac sodium daily. No constipation. No rectal bleeding. Sometimes her stomach feels "sore". Has chronic joint pain, back pain. Uses a cane for assistance with ambulation.     Past Medical History  Diagnosis Date  . Depression   . Anxiety   . Arthritis   . Obesity   . Hypercholesteremia   . Fibromyalgia   . GERD (gastroesophageal reflux disease)   . Back fracture 05/21/10  . Knee fracture, right 05/21/10  . Ankle fracture, right 05/21/10  . Thrombocytopenia ([facility]) 02/19/2012  . 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 Splenomegaly 09/23/2012  . 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 Breast biopsy  1982    left benign  . Partial hysterectomy    . Esophagogastroduodenoscopy  03/02/2012    Dr. Oneida Alar:The mucosa of the esophagus appeared normal Multiple non-bleeding ulcers, ranging between 3-83mm in size were found in the gastric antrum  The duodenal mucosa showed no abnormalities in the bulb and second portion of the duodenum. path negative for H.pylori  .  Esophagogastroduodenoscopy N/A 07/15/2014    SLF: 1. No esophageal, gastric, or duodenal varices 2. small 1-2 mm gastric ulcer. Negative H.pylori   . Abdominal hysterectomy    . Colonoscopy  June 2010    Dr. Collene Mares: few scattered sigmoid diverticula, otherwise normal. Screening in 2020     Current Outpatient Prescriptions  Medication Sig Dispense Refill  . bisacodyl (DULCOLAX) 5 MG EC tablet Take 10 mg by mouth daily as needed. Constipation    . Calcium Carbonate-Vit D-Min (CALTRATE PLUS PO) Take 1 tablet by mouth daily.     . Cyanocobalamin (VITAMIN B 12 PO) Take 2,000 mcg by mouth daily.    . Diclofenac Sodium CR 100 MG 24 hr tablet Take 100 mg by mouth daily.  0  . docusate sodium (COLACE) 100 MG capsule Take 100 mg by mouth at bedtime. Takes 2 at bedtime    . Flaxseed, Linseed, (FLAXSEED OIL) 1000 MG CAPS Take 1,000 mg by mouth 2 (two) times daily.     Marland Kitchen FLUoxetine (PROZAC) 40 MG capsule Take 40 mg by mouth daily.      . furosemide (LASIX) 20 MG tablet Take 1 tablet (20 mg total) by mouth daily. Take prn as needed for swelling 90 tablet 3  . gabapentin (NEURONTIN) 100 MG capsule Take 1 capsule (100 mg total) by mouth 3 (three) times daily. 60 capsule 1  . loratadine (CLARITIN) 10 MG tablet Take 10 mg by mouth as needed for allergies.    Marland Kitchen  Multiple Vitamins-Minerals (MULTIVITAMIN GUMMIES ADULT PO) Take 2 each by mouth daily. Vitafusion Women's complete multivitamin gummy    . omeprazole (PRILOSEC) 20 MG capsule Take 20 mg by mouth daily.      Marland Kitchen oxybutynin (DITROPAN-XL) 5 MG 24 hr tablet Take 5 mg by mouth at bedtime.    . pantoprazole (PROTONIX) 40 MG tablet Take 1 tablet (40 mg total) by mouth daily. 90 tablet 3  . Propylene Glycol (SYSTANE BALANCE) 0.6 % SOLN Apply 1 drop to eye daily as needed. Dry Eyes    . psyllium (METAMUCIL) 58.6 % powder Take 1 packet by mouth as needed.    . simvastatin (ZOCOR) 40 MG tablet Take 40 mg by mouth every evening.    . traMADol (ULTRAM-ER) 100 MG 24 hr  tablet Take 100 mg by mouth daily.    . traZODone (DESYREL) 100 MG tablet Take 100 mg by mouth at bedtime. Takes 1/2 tablet at bedtime.    . trolamine salicylate (ASPERCREME) 10 % cream Apply topically as needed.     No current facility-administered medications for this visit.    Allergies as of 05/12/2015  . (No Known Allergies)    Family History  Problem Relation Age of Onset  . Heart disease Father   . Heart attack Father   . Diabetes Sister   . Hypertension Sister   . Heart disease Sister   . Hypertension Brother   . Heart disease Brother   . Colon cancer Neg Hx   . Aneurysm Mother     Social History   Social History  . Marital Status: Married    Spouse Name: N/A  . Number of Children: N/A  . Years of Education: N/A   Occupational History  . 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 Smoking status: Never Smoker   . Smokeless tobacco: Never Used  . Alcohol Use: No  . Drug Use: No  . Sexual Activity: No   Other Topics Concern  . None   Social History Narrative   HAS A HUSBAND WHO MAY HAVE DEMENTIA AND IS CHALLENGING TO CARE FOR.    Review of Systems: Negative unless mentioned in HPI   Physical Exam: BP 124/72 mmHg  Pulse 74  Temp(Src) 97.3 F (36.3 C) (Oral)  Ht 5\' 2"  (1.575 m)  Wt 232 lb 6.4 oz (105.416 kg)  BMI 42.50 kg/m2 General:   Alert and oriented. No distress noted. Pleasant and cooperative.  Head:  Normocephalic and atraumatic. Eyes:  Conjuctiva clear without scleral icterus. Mouth:  Oral mucosa pink and moist.  Heart:  S1, S2 present without murmurs, rubs, or gallops. Regular rate and rhythm. Abdomen:  +BS, soft, non-tender and non-distended. No rebound or guarding. No HSM or masses noted. Msk:  Symmetrical without gross deformities. Normal posture. Extremities:  With 1+ trace pitting lower extremity edema. Neurologic:  Alert and  oriented x4;  grossly normal neurologically. Psych:  Alert and  cooperative. Normal mood and affect.

## 2015-05-12 NOTE — Assessment & Plan Note (Signed)
75 year old female with history of PUD in April 2016, overdue for routine surveillance. As of note, takes diclofenac daily. May need to reconsider this if persistent ulcer disease. Remains on Protonix daily. Vague concerns of throat feeling sore. If EGD unrevealing, may need ENT evaluation.   Proceed with upper endoscopy in the near future with Dr. Oneida Alar. The risks, benefits, and alternatives have been discussed in detail with patient. They have stated understanding and desire to proceed.

## 2015-05-12 NOTE — Telephone Encounter (Signed)
Per [facility] online CASE # [id number] was submitted for approval of EGD.  Awaiting approval

## 2015-05-12 NOTE — Patient Instructions (Signed)
Please have blood work done today. This is routine for your liver.   Your next ultrasound will be in  6 months, and we will see you in 6 months.  Finally, we have scheduled you for an upper endoscopy with Dr. Oneida Alar to ensure that your ulcer is healing or healed.

## 2015-05-12 NOTE — Progress Notes (Signed)
CC'ED TO PCP 

## 2015-05-12 NOTE — Assessment & Plan Note (Signed)
US abdomen up-to-date with no HCC and due again in 6 months. Needs updated labs to assess MELD score. Overall, well-compensated. Labs now and return in 6 months.

## 2015-05-13 LAB — CBC
HCT: 39.6 % (ref 36.0–46.0)
Hemoglobin: 12.9 g/dL (ref 12.0–15.0)
MCH: 32.7 pg (ref 26.0–34.0)
MCHC: 32.6 g/dL (ref 30.0–36.0)
MCV: 100.3 fL — AB (ref 78.0–100.0)
MPV: 12 fL (ref 8.6–12.4)
Platelets: 76 10*3/uL — ABNORMAL LOW (ref 150–400)
RBC: 3.95 MIL/uL (ref 3.87–5.11)
RDW: 13.4 % (ref 11.5–15.5)
WBC: 4.7 10*3/uL (ref 4.0–10.5)

## 2015-05-13 LAB — PROTIME-INR
INR: 1.12 (ref <1.50)
Prothrombin Time: 14.5 seconds (ref 11.6–15.2)

## 2015-05-13 NOTE — Progress Notes (Signed)
REVIEWED-NO ADDITIONAL RECOMMENDATIONS. 

## 2015-05-15 LAB — AFP TUMOR MARKER: AFP-Tumor Marker: 6.7 ng/mL — ABNORMAL HIGH (ref <6.1)

## 2015-05-18 NOTE — Telephone Encounter (Signed)
Approved per [facility] online

## 2015-05-23 DIAGNOSIS — Z1389 Encounter for screening for other disorder: Secondary | ICD-10-CM | POA: Diagnosis not present

## 2015-05-23 DIAGNOSIS — M2550 Pain in unspecified joint: Secondary | ICD-10-CM | POA: Diagnosis not present

## 2015-05-23 DIAGNOSIS — M1991 Primary osteoarthritis, unspecified site: Secondary | ICD-10-CM | POA: Diagnosis not present

## 2015-05-30 NOTE — Progress Notes (Signed)
Quick Note:  MELD Na is 7. Hgb improved. AST continues to stay in the low 40s, at baseline. AFP is slightly elevated from previously. It has remained consistently in the 4.7 range, now 6.7. Interestingly, this has been shown to be elevated in individuals with elevated AST. It could be elevated for other reasons as well, but it is not markedly elevated. Let's recheck AFP only in 3 months. Would consider MRI liver if any further increase. Recent US abdomen on file. ______

## 2015-05-30 NOTE — Progress Notes (Signed)
Quick Note:  LMOM to call. ______ 

## 2015-05-31 ENCOUNTER — Other Ambulatory Visit: Payer: Self-pay

## 2015-05-31 DIAGNOSIS — R978 Other abnormal tumor markers: Secondary | ICD-10-CM

## 2015-05-31 NOTE — Progress Notes (Signed)
Quick Note:  Pt is aware and lab order on file for 08/31/2015. ______

## 2015-06-05 ENCOUNTER — Ambulatory Visit (HOSPITAL_COMMUNITY)
Admission: RE | Admit: 2015-06-05 | Discharge: 2015-06-05 | Disposition: A | Discharge status: Home / Self Care | Payer: Medicare Other | Source: Ambulatory Visit | Attending: Gastroenterology | Admitting: Gastroenterology

## 2015-06-05 ENCOUNTER — Encounter (HOSPITAL_COMMUNITY): Payer: Self-pay

## 2015-06-05 ENCOUNTER — Encounter (HOSPITAL_COMMUNITY)
Admission: RE | Disposition: A | Discharge status: Home / Self Care | Payer: Self-pay | Source: Ambulatory Visit | Attending: Gastroenterology

## 2015-06-05 DIAGNOSIS — K279 Peptic ulcer, site unspecified, unspecified as acute or chronic, without hemorrhage or perforation: Secondary | ICD-10-CM | POA: Diagnosis not present

## 2015-06-05 DIAGNOSIS — Z79899 Other long term (current) drug therapy: Secondary | ICD-10-CM | POA: Diagnosis not present

## 2015-06-05 DIAGNOSIS — M1991 Primary osteoarthritis, unspecified site: Secondary | ICD-10-CM | POA: Insufficient documentation

## 2015-06-05 DIAGNOSIS — Z6841 Body Mass Index (BMI) 40.0 and over, adult: Secondary | ICD-10-CM | POA: Diagnosis not present

## 2015-06-05 DIAGNOSIS — F329 Major depressive disorder, single episode, unspecified: Secondary | ICD-10-CM | POA: Diagnosis not present

## 2015-06-05 DIAGNOSIS — E669 Obesity, unspecified: Secondary | ICD-10-CM | POA: Insufficient documentation

## 2015-06-05 DIAGNOSIS — K2970 Gastritis, unspecified, without bleeding: Secondary | ICD-10-CM | POA: Diagnosis not present

## 2015-06-05 DIAGNOSIS — E7800 Pure hypercholesterolemia, unspecified: Secondary | ICD-10-CM | POA: Diagnosis not present

## 2015-06-05 DIAGNOSIS — Z7982 Long term (current) use of aspirin: Secondary | ICD-10-CM | POA: Insufficient documentation

## 2015-06-05 DIAGNOSIS — K219 Gastro-esophageal reflux disease without esophagitis: Secondary | ICD-10-CM | POA: Insufficient documentation

## 2015-06-05 HISTORY — PX: ESOPHAGOGASTRODUODENOSCOPY: SHX5428

## 2015-06-05 SURGERY — EGD (ESOPHAGOGASTRODUODENOSCOPY)
Anesthesia: Moderate Sedation

## 2015-06-05 MED ORDER — SODIUM CHLORIDE 0.9 % IV SOLN
INTRAVENOUS | Status: DC
  Administered 2015-06-05: 09:00:00 via INTRAVENOUS

## 2015-06-05 MED ORDER — LIDOCAINE VISCOUS 2 % MT SOLN
OROMUCOSAL | Status: DC | PRN
  Administered 2015-06-05: 1 via OROMUCOSAL

## 2015-06-05 MED ORDER — MEPERIDINE HCL 100 MG/ML IJ SOLN
INTRAMUSCULAR | Status: AC
  Filled 2015-06-05: qty 2

## 2015-06-05 MED ORDER — LIDOCAINE VISCOUS 2 % MT SOLN
OROMUCOSAL | Status: AC
  Filled 2015-06-05: qty 15

## 2015-06-05 MED ORDER — MEPERIDINE HCL 100 MG/ML IJ SOLN
INTRAMUSCULAR | Status: DC | PRN
  Administered 2015-06-05 (×2): 25 mg via INTRAVENOUS

## 2015-06-05 MED ORDER — MIDAZOLAM HCL 5 MG/5ML IJ SOLN
INTRAMUSCULAR | Status: DC | PRN
  Administered 2015-06-05 (×2): 2 mg via INTRAVENOUS
  Administered 2015-06-05: 1 mg via INTRAVENOUS

## 2015-06-05 MED ORDER — STERILE WATER FOR IRRIGATION IR SOLN
Status: DC | PRN
  Administered 2015-06-05: 2.5 mL

## 2015-06-05 MED ORDER — MIDAZOLAM HCL 5 MG/5ML IJ SOLN
INTRAMUSCULAR | Status: AC
  Filled 2015-06-05: qty 10

## 2015-06-05 NOTE — Discharge Instructions (Signed)
YOUR ULCERS ARE HEALED. You have mild gastritis DUE TO YOUR USING ASPIRIN.   CONTINUE PROTONIX. TAKE 30 MINUTES PRIOR TO BREAKFAST FOREVER.  FOLLOW UP MAR 2018.   UPPER ENDOSCOPY AFTER CARE Read the instructions outlined below and refer to this sheet in the next week. These discharge instructions provide you with general information on caring for yourself after you leave the hospital. While your treatment has been planned according to the most current medical practices available, unavoidable complications occasionally occur. If you have any problems or questions after discharge, call DR. Lacrystal Barbe, [phone].  ACTIVITY  You may resume your regular activity, but move at a slower pace for the next 24 hours.   Take frequent rest periods for the next 24 hours.   Walking will help get rid of the air and reduce the bloated feeling in your belly (abdomen).   No driving for 24 hours (because of the medicine (anesthesia) used during the test).   You may shower.   Do not sign any important legal documents or operate any machinery for 24 hours (because of the anesthesia used during the test).    NUTRITION  Drink plenty of fluids.   You may resume your normal diet as instructed by your doctor.   Begin with a light meal and progress to your normal diet. Heavy or fried foods are harder to digest and may make you feel sick to your stomach (nauseated).   Avoid alcoholic beverages for 24 hours or as instructed.    MEDICATIONS  You may resume your normal medications.   WHAT YOU CAN EXPECT TODAY  Some feelings of bloating in the abdomen.   Passage of more gas than usual.    IF YOU HAD A BIOPSY TAKEN DURING THE UPPER ENDOSCOPY:  Eat a soft diet IF YOU HAVE NAUSEA, BLOATING, ABDOMINAL PAIN, OR VOMITING.    FINDING OUT THE RESULTS OF YOUR TEST Not all test results are available during your visit. DR. Oneida Alar WILL CALL YOU WITHIN 7 DAYS OF YOUR PROCEDUE WITH YOUR RESULTS. Do not assume  everything is normal if you have not heard from DR. Fong Mccarry IN ONE WEEK, CALL HER OFFICE AT [phone].  SEEK IMMEDIATE MEDICAL ATTENTION AND CALL THE OFFICE: [phone] IF:  You have more than a spotting of blood in your stool.   Your belly is swollen (abdominal distention).   You are nauseated or vomiting.   You have a temperature over 101F.   You have abdominal pain or discomfort that is severe or gets worse throughout the day.   Gastritis  Gastritis is an inflammation (the body's way of reacting to injury and/or infection) of the stomach. It is often caused by bacterial (germ) infections. It can also be caused BY ASPIRIN, BC/GOODY POWDER'S, (IBUPROFEN) MOTRIN, OR ALEVE (NAPROXEN), chemicals (including alcohol), SPICY FOODS, and medications. This illness may be associated with generalized malaise (feeling tired, not well), UPPER ABDOMINAL STOMACH cramps, and fever. One common bacterial cause of gastritis is an organism known as H. Pylori. This can be treated with antibiotics.

## 2015-06-05 NOTE — H&P (Signed)
Primary Care Physician:  [name], MD Primary Gastroenterologist:  Dr. Oneida Alar  Pre-Procedure History & Physical: HPI:  Melanie Porter is a 75 y.o. female here for SCREENING FOR PUD.  Past Medical History  Diagnosis Date  . Depression   . Anxiety   . Arthritis   . Obesity   . Hypercholesteremia   . Fibromyalgia   . GERD (gastroesophageal reflux disease)   . Back fracture 05/21/10  . Knee fracture, right 05/21/10  . Ankle fracture, right 05/21/10  . Thrombocytopenia ([place]) 02/19/2012  . 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 Splenomegaly 09/23/2012  . 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 Breast biopsy  1982    left benign  . Partial hysterectomy    . Esophagogastroduodenoscopy  03/02/2012    Dr. Oneida Alar:The mucosa of the esophagus appeared normal Multiple non-bleeding ulcers, ranging between 3-75mm in size were found in the gastric antrum  The duodenal mucosa showed no abnormalities in the bulb and second portion of the duodenum. path negative for H.pylori  . Esophagogastroduodenoscopy N/A 07/15/2014    SLF: 1. No esophageal, gastric, or duodenal varices 2. small 1-2 mm gastric ulcer. Negative H.pylori   . Abdominal hysterectomy    . Colonoscopy  June 2010    Dr. Collene Mares: few scattered sigmoid diverticula, otherwise normal. Screening in 2020     Prior to Admission medications   Medication Sig Start Date End Date Taking? Authorizing Provider  aspirin EC 81 MG tablet Take 81 mg by mouth at bedtime.   Yes Historical Provider, MD  bisacodyl (DULCOLAX) 5 MG EC tablet Take 10 mg by mouth daily as needed. Constipation   Yes Historical Provider, MD  busPIRone (BUSPAR) 5 MG tablet Take 1 tablet by mouth 3 (three) times daily. 05/12/15  Yes Historical Provider, MD  Calcium  Carbonate-Vit D-Min (CALTRATE PLUS PO) Take 1 tablet by mouth daily.    Yes Historical Provider, MD  Cyanocobalamin (VITAMIN B 12 PO) Take 2,000 mcg by mouth daily.   Yes Historical Provider, MD  Diclofenac Sodium CR 100 MG 24 hr tablet Take 100 mg by mouth daily. 11/25/14  Yes Historical Provider, MD  docusate sodium (COLACE) 100 MG capsule Take 100 mg by mouth daily as needed for mild constipation.    Yes Historical Provider, MD  Flaxseed, Linseed, (FLAXSEED OIL) 1000 MG CAPS Take 1,000 mg by mouth 2 (two) times daily.    Yes Historical Provider, MD  furosemide (LASIX) 20 MG tablet Take 1 tablet (20 mg total) by mouth daily. Take prn as needed for swelling 12/28/13  Yes Arnoldo Lenis, MD  loratadine (CLARITIN) 10 MG tablet Take 10 mg by mouth as needed for allergies.   Yes Historical Provider, MD  Magnesium Hydroxide (MILK OF MAGNESIA PO) Take 1 tablet by mouth at bedtime.   Yes Historical Provider, MD  Multiple Vitamin (MULTIVITAMIN WITH MINERALS) TABS tablet Take 1 tablet by mouth daily.   Yes Historical Provider, MD  Omega-3 Fatty Acids (FISH OIL) 1200 MG CAPS Take 1 capsule by mouth daily.   Yes Historical Provider, MD  pantoprazole (PROTONIX) 40 MG tablet Take 1 tablet (40 mg total) by mouth daily. 03/23/15  Yes Orvil Feil, NP  Propylene Glycol (SYSTANE BALANCE) 0.6 % SOLN Apply 1  drop to eye daily as needed. Dry Eyes   Yes Historical Provider, MD  psyllium (METAMUCIL) 58.6 % powder Take 1 packet by mouth as needed (constipation).    Yes Historical Provider, MD  simvastatin (ZOCOR) 40 MG tablet Take 40 mg by mouth every evening.   Yes Historical Provider, MD  traZODone (DESYREL) 100 MG tablet Take 50 mg by mouth at bedtime.    Yes Historical Provider, MD  trolamine salicylate (ASPERCREME) 10 % cream Apply 1 application topically as needed for muscle pain.    Yes Historical Provider, MD  FLUoxetine (PROZAC) 40 MG capsule Take 40 mg by mouth daily.      Historical Provider, MD    Allergies  as of 05/12/2015  . (No Known Allergies)    Family History  Problem Relation Age of Onset  . Heart disease Father   . Heart attack Father   . Diabetes Sister   . Hypertension Sister   . Heart disease Sister   . Hypertension Brother   . Heart disease Brother   . Colon cancer Neg Hx   . Aneurysm Mother     Social History   Social History  . Marital Status: Married    Spouse Name: N/A  . Number of Children: N/A  . Years of Education: N/A   Occupational History  . 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 Smoking status: Never Smoker   . Smokeless tobacco: Never Used  . Alcohol Use: No  . Drug Use: No  . Sexual Activity: No   Other Topics Concern  . Not on file   Social History Narrative   HAS A HUSBAND WHO MAY HAVE DEMENTIA AND IS CHALLENGING TO CARE FOR.    Review of Systems: See HPI, otherwise negative ROS   Physical Exam: BP 136/65 mmHg  Pulse 67  Temp(Src) 98.1 F (36.7 C) (Oral)  Resp 22  Ht 5\' 2"  (1.575 m)  Wt 233 lb (105.688 kg)  BMI 42.61 kg/m2  SpO2 96% General:   Alert,  pleasant and cooperative in NAD Head:  Normocephalic and atraumatic. Neck:  Supple; Lungs:  Clear throughout to auscultation.    Heart:  Regular rate and rhythm. Abdomen:  Soft, nontender and nondistended. Normal bowel sounds, without guarding, and without rebound.   Neurologic:  Alert and  oriented x4;  grossly normal neurologically.  Impression/Plan:     PUD  PLAN:  REPEAT EGD TO CONFIRM ULCERS ARE HEALED.

## 2015-06-05 NOTE — Progress Notes (Signed)
REVIEWED-NO ADDITIONAL RECOMMENDATIONS. 

## 2015-06-05 NOTE — Op Note (Addendum)
[facility] [address] [place], [address]   ENDOSCOPY PROCEDURE REPORT  PATIENT: Melanie, Porter  MR#: [id number] BIRTHDATE: 04-Sep-1940 , 74  yrs. old GENDER: female  ENDOSCOPIST: Danie Binder, MD REFERRED [id number] Melanie Porter, M.D.  PROCEDURE DATE: 07/05/15 PROCEDURE:   EGD, diagnostic INDICATIONS:PT HAS PMHx: PUD EGD TODAY TO ASSESS IF ULCERS ARE HEALED.  MEDICATIONS: Demerol 50 mg IV and Versed 5 mg IV MD STARTED [id number]. PROCEDURE COMPLETE: 1001 TOPICAL ANESTHETIC:   Viscous Xylocaine ASA CLASS:  DESCRIPTION OF PROCEDURE:     Physical exam was performed.  Informed consent was obtained from the patient after explaining the benefits, risks, and alternatives to the procedure.  The patient was connected to the monitor and placed in the left lateral position.  Continuous oxygen was provided by nasal cannula and IV medicine administered through an indwelling cannula.  After administration of sedation, the patients esophagus was intubated and the [id number]  endoscope was advanced under direct visualization to the second portion of the duodenum.  The scope was removed slowly by carefully examining the color, texture, anatomy, and integrity of the mucosa on the way out.  The patient was recovered in endoscopy and discharged home in satisfactory condition.  Estimated blood loss is zero unless otherwise noted in this procedure report.    ESOPHAGUS: NORMAL.   STOMACH: MILD NON-EROSIVE GASTRITIS. NO ULCERS OR EROSIONS.  ANTRUM.  A DUODENUM: The duodenal mucosa showed no abnormalities.         COMPLICATIONS: There were no immediate complications.  ENDOSCOPIC IMPRESSION: 1.   MILD GASTRITIS ON ASA AND PPI  RECOMMENDATIONS: PROTONIX DAILY CONTINUE ASA DAILY. OPV W/ DR.  Willowdean Porter IN ONE YEAR.  REPEAT EXAM:   _______________________________ Lorrin MaisDanie Binder, MD 05-Jul-2015 12:18 PM Revised: Jul 05, 2015 12:18 PM    CPT CODES: ICD  CODES:  The ICD and CPT codes recommended by this software are interpretations from the data that the clinical staff has captured with the software.  The verification of the translation of this report to the ICD and CPT codes and modifiers is the sole responsibility of the health care institution and practicing physician where this report was generated.  [facility]. will not be held responsible for the validity of the ICD and CPT codes included on this report.  AMA assumes no liability for data contained or not contained herein. CPT is a Designer, television/film set of the [employer].

## 2015-06-07 ENCOUNTER — Encounter (HOSPITAL_COMMUNITY): Payer: Self-pay | Admitting: Gastroenterology

## 2015-06-13 DIAGNOSIS — H25011 Cortical age-related cataract, right eye: Secondary | ICD-10-CM | POA: Diagnosis not present

## 2015-06-13 DIAGNOSIS — H25013 Cortical age-related cataract, bilateral: Secondary | ICD-10-CM | POA: Diagnosis not present

## 2015-06-13 DIAGNOSIS — H2513 Age-related nuclear cataract, bilateral: Secondary | ICD-10-CM | POA: Diagnosis not present

## 2015-06-13 DIAGNOSIS — H2511 Age-related nuclear cataract, right eye: Secondary | ICD-10-CM | POA: Diagnosis not present

## 2015-06-20 ENCOUNTER — Encounter (HOSPITAL_COMMUNITY): Payer: Medicare Other | Attending: Hematology & Oncology

## 2015-06-20 DIAGNOSIS — Z7982 Long term (current) use of aspirin: Secondary | ICD-10-CM | POA: Diagnosis not present

## 2015-06-20 DIAGNOSIS — K219 Gastro-esophageal reflux disease without esophagitis: Secondary | ICD-10-CM | POA: Diagnosis not present

## 2015-06-20 DIAGNOSIS — D696 Thrombocytopenia, unspecified: Secondary | ICD-10-CM | POA: Insufficient documentation

## 2015-06-20 DIAGNOSIS — E669 Obesity, unspecified: Secondary | ICD-10-CM | POA: Insufficient documentation

## 2015-06-20 DIAGNOSIS — Z9889 Other specified postprocedural states: Secondary | ICD-10-CM | POA: Diagnosis not present

## 2015-06-20 DIAGNOSIS — Z79899 Other long term (current) drug therapy: Secondary | ICD-10-CM | POA: Insufficient documentation

## 2015-06-20 DIAGNOSIS — K7460 Unspecified cirrhosis of liver: Secondary | ICD-10-CM | POA: Insufficient documentation

## 2015-06-20 DIAGNOSIS — M1990 Unspecified osteoarthritis, unspecified site: Secondary | ICD-10-CM | POA: Insufficient documentation

## 2015-06-20 DIAGNOSIS — M797 Fibromyalgia: Secondary | ICD-10-CM | POA: Diagnosis not present

## 2015-06-20 DIAGNOSIS — D691 Qualitative platelet defects: Secondary | ICD-10-CM | POA: Diagnosis not present

## 2015-06-20 DIAGNOSIS — Z90710 Acquired absence of both cervix and uterus: Secondary | ICD-10-CM | POA: Diagnosis not present

## 2015-06-20 DIAGNOSIS — F329 Major depressive disorder, single episode, unspecified: Secondary | ICD-10-CM | POA: Diagnosis not present

## 2015-06-20 DIAGNOSIS — R161 Splenomegaly, not elsewhere classified: Secondary | ICD-10-CM | POA: Insufficient documentation

## 2015-06-20 DIAGNOSIS — E7800 Pure hypercholesterolemia, unspecified: Secondary | ICD-10-CM | POA: Diagnosis not present

## 2015-06-20 DIAGNOSIS — F419 Anxiety disorder, unspecified: Secondary | ICD-10-CM | POA: Insufficient documentation

## 2015-06-20 LAB — CBC WITH DIFFERENTIAL/PLATELET
BASOS PCT: 0 %
Basophils Absolute: 0 10*3/uL (ref 0.0–0.1)
EOS ABS: 0.1 10*3/uL (ref 0.0–0.7)
Eosinophils Relative: 2 %
HCT: 37.3 % (ref 36.0–46.0)
Hemoglobin: 12.3 g/dL (ref 12.0–15.0)
LYMPHS ABS: 0.9 10*3/uL (ref 0.7–4.0)
Lymphocytes Relative: 19 %
MCH: 33.2 pg (ref 26.0–34.0)
MCHC: 33 g/dL (ref 30.0–36.0)
MCV: 100.5 fL — ABNORMAL HIGH (ref 78.0–100.0)
MONO ABS: 0.3 10*3/uL (ref 0.1–1.0)
MONOS PCT: 7 %
NEUTROS ABS: 3.3 10*3/uL (ref 1.7–7.7)
NEUTROS PCT: 72 %
PLATELETS: 63 10*3/uL — AB (ref 150–400)
RBC: 3.71 MIL/uL — ABNORMAL LOW (ref 3.87–5.11)
RDW: 12.4 % (ref 11.5–15.5)
WBC: 4.6 10*3/uL (ref 4.0–10.5)

## 2015-06-21 ENCOUNTER — Ambulatory Visit: Payer: Medicare Other | Admitting: Gastroenterology

## 2015-06-22 ENCOUNTER — Encounter: Payer: Self-pay | Admitting: Gastroenterology

## 2015-06-22 ENCOUNTER — Encounter (HOSPITAL_BASED_OUTPATIENT_CLINIC_OR_DEPARTMENT_OTHER): Payer: Medicare Other | Admitting: Oncology

## 2015-06-22 ENCOUNTER — Ambulatory Visit (HOSPITAL_COMMUNITY): Payer: Medicare Other | Admitting: Hematology & Oncology

## 2015-06-22 ENCOUNTER — Ambulatory Visit (INDEPENDENT_AMBULATORY_CARE_PROVIDER_SITE_OTHER): Payer: Medicare Other | Admitting: Gastroenterology

## 2015-06-22 ENCOUNTER — Ambulatory Visit (HOSPITAL_COMMUNITY): Payer: Medicare Other | Admitting: Oncology

## 2015-06-22 ENCOUNTER — Other Ambulatory Visit (HOSPITAL_COMMUNITY): Payer: Medicare Other

## 2015-06-22 VITALS — BP 124/64 | HR 69 | Temp 97.5°F | Ht 62.0 in | Wt 238.0 lb

## 2015-06-22 VITALS — HR 70 | Temp 97.9°F | Resp 18 | Wt 238.0 lb

## 2015-06-22 DIAGNOSIS — K279 Peptic ulcer, site unspecified, unspecified as acute or chronic, without hemorrhage or perforation: Secondary | ICD-10-CM | POA: Diagnosis not present

## 2015-06-22 DIAGNOSIS — K7460 Unspecified cirrhosis of liver: Secondary | ICD-10-CM

## 2015-06-22 DIAGNOSIS — K7581 Nonalcoholic steatohepatitis (NASH): Secondary | ICD-10-CM | POA: Diagnosis not present

## 2015-06-22 DIAGNOSIS — M8580 Other specified disorders of bone density and structure, unspecified site: Secondary | ICD-10-CM | POA: Diagnosis not present

## 2015-06-22 DIAGNOSIS — D696 Thrombocytopenia, unspecified: Secondary | ICD-10-CM

## 2015-06-22 DIAGNOSIS — R161 Splenomegaly, not elsewhere classified: Secondary | ICD-10-CM

## 2015-06-22 DIAGNOSIS — D691 Qualitative platelet defects: Secondary | ICD-10-CM

## 2015-06-22 DIAGNOSIS — D472 Monoclonal gammopathy: Secondary | ICD-10-CM

## 2015-06-22 NOTE — Assessment & Plan Note (Signed)
Thrombocytopenia in the setting of cirrhosis of liver and splenomegaly with also some intra-abdominal lymphadenopathy.  STABLE.  I personally reviewed and went over laboratory results with the patient.  The results are noted within this dictation.  Labs in 6 months: CBC diff, CMET  Return in 6 months for follow-up.

## 2015-06-22 NOTE — Patient Instructions (Addendum)
1. We will plan on further labs in six month prior to follow up. I will see if we can coordinate with your labs being done at [facility].

## 2015-06-22 NOTE — Progress Notes (Signed)
Primary Care Physician: [name], MD  Primary Gastroenterologist:  Barney Drain, MD   Chief Complaint  Patient presents with  . Follow-up    HPI: [name] is a 75 y.o. female here for follow-up. Recent EGD March 2017 for history of peptic ulcer disease and to assess healing of ulcers. She had mild gastritis but no persisting ulcers in the setting of aspirin and PPI therapy. She also has h/o NASH cirrhosis. Her labs were done in 05/2015, u/s in 04/2015. MELD NA 7 at that time. AFP slightly elevated at 6.7. Plans to repeat in 3 months.   Clinically she has no GI complaints. No abdominal pain. Complains of cough with talking/lay down at night. Denies cough with meals or liquids. BM regular. No melena, brbpr.   Current Outpatient Prescriptions  Medication Sig Dispense Refill  . aspirin EC 81 MG tablet Take 81 mg by mouth at bedtime.    . bisacodyl (DULCOLAX) 5 MG EC tablet Take 10 mg by mouth daily as needed. Constipation    . Bromfenac Sodium 0.07 % SOLN     . busPIRone (BUSPAR) 5 MG tablet Take 1 tablet by mouth 3 (three) times daily.  0  . Calcium Carbonate-Vit D-Min (CALTRATE PLUS PO) Take 1 tablet by mouth daily.     . Cyanocobalamin (VITAMIN B 12 PO) Take 2,000 mcg by mouth daily.    . Diclofenac Sodium CR 100 MG 24 hr tablet Take 100 mg by mouth daily.  0  . docusate sodium (COLACE) 100 MG capsule Take 100 mg by mouth daily as needed for mild constipation.     . Flaxseed, Linseed, (FLAXSEED OIL) 1000 MG CAPS Take 1,000 mg by mouth 2 (two) times daily.     Marland Kitchen FLUoxetine (PROZAC) 40 MG capsule Take 40 mg by mouth daily.      . furosemide (LASIX) 20 MG tablet Take 1 tablet (20 mg total) by mouth daily. Take prn as needed for swelling 90 tablet 3  . loratadine (CLARITIN) 10 MG tablet Take 10 mg by mouth as needed for allergies.    . Magnesium Hydroxide (MILK OF MAGNESIA PO) Take 1 tablet by mouth at bedtime.    . meloxicam (MOBIC) 7.5 MG tablet   0  . Multiple  Vitamin (MULTIVITAMIN WITH MINERALS) TABS tablet Take 1 tablet by mouth daily.    Marland Kitchen ofloxacin (OCUFLOX) 0.3 % ophthalmic solution     . ofloxacin (OCUFLOX) 0.3 % ophthalmic solution PUT 1 DROP TWICE DAILY IN OPERATIVE EYE STARTING 2 DAYS BEFORE SU...  (REFER TO PRESCRIPTION NOTES).  0  . Omega-3 Fatty Acids (FISH OIL) 1200 MG CAPS Take 1 capsule by mouth daily.    . pantoprazole (PROTONIX) 40 MG tablet Take 1 tablet (40 mg total) by mouth daily. 90 tablet 3  . PROLENSA 0.07 % SOLN PUT 1 DROP IN OPERATIVE EYE DAILY STARTING 2 DAYS BEFORE SURGERY ...  (REFER TO PRESCRIPTION NOTES).  0  . Propylene Glycol (SYSTANE BALANCE) 0.6 % SOLN Apply 1 drop to eye daily as needed. Dry Eyes    . psyllium (METAMUCIL) 58.6 % powder Take 1 packet by mouth as needed (constipation).     . simvastatin (ZOCOR) 40 MG tablet Take 40 mg by mouth every evening.    . traZODone (DESYREL) 100 MG tablet Take 50 mg by mouth at bedtime.     . trolamine salicylate (ASPERCREME) 10 % cream Apply 1 application topically as needed for muscle pain.  No current facility-administered medications for this visit.    Allergies as of 06/22/2015  . (No Known Allergies)    ROS:  General: Negative for anorexia, weight loss, fever, chills, fatigue, weakness. ENT: Negative for hoarseness, difficulty swallowing , nasal congestion. CV: Negative for chest pain, angina, palpitations, dyspnea on exertion, peripheral edema.  Respiratory: Negative for dyspnea at rest, dyspnea on exertion, cough, sputum, wheezing.  GI: See history of present illness. GU:  Negative for dysuria, hematuria, urinary incontinence, urinary frequency, nocturnal urination.  Endo: Negative for unusual weight change.    Physical Examination:   BP 124/64 mmHg  Pulse 69  Temp(Src) 97.5 F (36.4 C) (Oral)  Ht 5\' 2"  (1.575 m)  Wt 238 lb (107.956 kg)  BMI 43.52 kg/m2  General: Well-nourished, well-developed in no acute distress.  Eyes: No icterus. Mouth:  Oropharyngeal mucosa moist and pink , no lesions erythema or exudate. Lungs: Clear to auscultation bilaterally.  Heart: Regular rate and rhythm, no murmurs rubs or gallops.  Abdomen: Bowel sounds are normal, nontender, nondistended, no hepatosplenomegaly or masses, no abdominal bruits or hernia , no rebound or guarding.   Extremities: No lower extremity edema. No clubbing or deformities. Neuro: Alert and oriented x 4   Skin: Warm and dry, no jaundice.   Psych: Alert and cooperative, normal mood and affect.  Labs:  Lab Results  Component Value Date   CREATININE 0.72 05/12/2015   BUN 8 05/12/2015   NA 140 05/12/2015   K 4.2 05/12/2015   CL 102 05/12/2015   CO2 27 05/12/2015   Lab Results  Component Value Date   WBC 4.6 06/20/2015   HGB 12.3 06/20/2015   HCT 37.3 06/20/2015   MCV 100.5* 06/20/2015   PLT 63* 06/20/2015   Lab Results  Component Value Date   ALT 29 05/12/2015   AST 43* 05/12/2015   ALKPHOS 132* 05/12/2015   BILITOT 0.8 05/12/2015   Lab Results  Component Value Date   INR 1.12 05/12/2015   INR 1.13 06/08/2014   INR 1.08 02/24/2012     Imaging Studies: No results found.

## 2015-06-22 NOTE — Assessment & Plan Note (Signed)
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  Labs in 6 months: CBC diff, CMET, LDH, SPEP + IFE, and light chain assay.

## 2015-06-22 NOTE — Patient Instructions (Signed)
[place] at [facility] Discharge Instructions  RECOMMENDATIONS MADE BY THE CONSULTANT AND ANY TEST RESULTS WILL BE SENT TO YOUR REFERRING PHYSICIAN.  Labs the other day were stable without any major changes. We will repeat labs in 6 months time Return in 6 months for follow-up. Continue follow-up with other physicians as scheduled.   Thank you for choosing [place] at [facility] to provide your oncology and hematology care.  To afford each patient quality time with our provider, please arrive at least 15 minutes before your scheduled appointment time.   Beginning January 23rd 2017 lab work for the [facility] will be done in the  Main lab at [facility] on 1st floor. If you have a lab appointment with the [facility] please come in thru the  Main Entrance and check in at the main information desk  You need to re-schedule your appointment should you arrive 10 or more minutes late.  We strive to give you quality time with our providers, and arriving late affects you and other patients whose appointments are after yours.  Also, if you no show three or more times for appointments you may be dismissed from the clinic at the providers discretion.     Again, thank you for choosing [facility].  Our hope is that these requests will decrease the amount of time that you wait before being seen by our physicians.       _____________________________________________________________  Should you have questions after your visit to [facility], please contact our office at [phone] between the hours of 8:30 a.m. and 4:30 p.m.  Voicemails left after 4:30 p.m. will not be returned until the following business day.  For prescription refill requests, have your pharmacy contact our office.         Resources For Cancer Patients and their Caregivers ? American Cancer Society: Can assist with transportation, wigs, general  needs, runs Look Good Feel Better.        936-872-6414 ? Cancer Care: Provides financial assistance, online support groups, medication/co-pay assistance.  1-800-813-HOPE 513-078-5029) ? [place] Assists [place] cancer patients and their families through emotional , educational and financial support.  914-287-2844 ? [place] DSS Where to apply for food stamps, Medicaid and utility assistance. 843-472-2497 ? [facility]: Transportation to medical appointments. 970-327-5698 ? Social Security Administration: May apply for disability if have a Stage IV cancer. 636-526-8940 403-756-1538 ? [employer], Disability and Transit Services: Assists with nutrition, care and transit needs. [phone]

## 2015-06-22 NOTE — Progress Notes (Signed)
[name], MD [place] [id number]  Platelet disorder(CLUMPING) - Plan: CBC with Differential, Comprehensive metabolic panel  Thrombocytopenia (HCC) - Plan: CBC with Differential, Comprehensive metabolic panel  IgM monoclonal gammopathy of uncertain significance - Plan: CBC with Differential, Comprehensive metabolic panel, Lactate dehydrogenase, Kappa/lambda light chains, IgG, IgA, IgM, Immunofixation electrophoresis, Protein electrophoresis, serum  Osteopenia - Plan: CBC with Differential, Comprehensive metabolic panel, Lactate dehydrogenase, Kappa/lambda light chains, IgG, IgA, IgM, Immunofixation electrophoresis, Protein electrophoresis, serum   CURRENT THERAPY: Observation  INTERVAL HISTORY: Melanie Porter 75 y.o. female returns for followup of thrombocytopenia in the setting of cirrhosis of liver and splenomegaly with also some intra-abdominal lymphadenopathy.    I personally reviewed and went over laboratory results with the patient.  The results are noted within this dictation.  Labs were updated the other day.  Platelet count is stable at 63,000.  I personally reviewed and went over radiographic studies with the patient.  The results are noted within this dictation.  [place] of abdomen on 04/12/2015 by GI re-demonstrated stable cirrhotic changes of the liver and mild splenomegaly.  She denies any bleeding, easy bruising, blood in stool, dark stool, hemoptysis, hematuria, gingival bleeding, epistaxis, etc.  She does admit to fatigue.  Past Medical History  Diagnosis Date  . Depression   . Anxiety   . Arthritis   . Obesity   . Hypercholesteremia   . Fibromyalgia   . GERD (gastroesophageal reflux disease)   . Back fracture 05/21/10  . Knee fracture, right 05/21/10  . Ankle fracture, right 05/21/10  . Thrombocytopenia ([place]) 02/19/2012  . 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 Pt states she  has received Hep A and B vaccinations as she worked in [employer]  . Splenomegaly 09/23/2012  . IgM monoclonal gammopathy of uncertain significance 09/23/2012    has Fibromyalgia; Hyperlipidemia; Arthritis; GERD (gastroesophageal reflux disease); Thrombocytopenia ([place]); Non-alcoholic micronodular cirrhosis of liver ([place]); Splenomegaly; IgM monoclonal gammopathy of uncertain significance; Platelet disorder([place]MPING); Dermatitis perineal; Osteopenia; Vaginal atrophy; PUD (peptic ulcer disease); Undiagnosed cardiac murmurs; Spinal stenosis of lumbar region; and Peptic ulcer disease on her problem list.     has No Known Allergies.  Current Outpatient Prescriptions on File Prior to Visit  Medication Sig Dispense Refill  . aspirin EC 81 MG tablet Take 81 mg by mouth at bedtime.    . bisacodyl (DULCOLAX) 5 MG EC tablet Take 10 mg by mouth daily as needed. Constipation    . busPIRone (BUSPAR) 5 MG tablet Take 1 tablet by mouth 3 (three) times daily.  0  . Calcium Carbonate-Vit D-Min (CALTRATE PLUS PO) Take 1 tablet by mouth daily.     . Cyanocobalamin (VITAMIN B 12 PO) Take 2,000 mcg by mouth daily.    . Diclofenac Sodium CR 100 MG 24 hr tablet Take 100 mg by mouth daily.  0  . docusate sodium (COLACE) 100 MG capsule Take 100 mg by mouth daily as needed for mild constipation.     . Flaxseed, Linseed, (FLAXSEED OIL) 1000 MG CAPS Take 1,000 mg by mouth 2 (two) times daily.     Marland Kitchen FLUoxetine (PROZAC) 40 MG capsule Take 40 mg by mouth daily.      . furosemide (LASIX) 20 MG tablet Take 1 tablet (20 mg total) by mouth daily. Take prn as needed for swelling 90 tablet 3  . loratadine (CLARITIN) 10 MG tablet Take 10  mg by mouth as needed for allergies.    . Magnesium Hydroxide (MILK OF MAGNESIA PO) Take 1 tablet by mouth at bedtime.    . Multiple Vitamin (MULTIVITAMIN WITH MINERALS) TABS tablet Take 1 tablet by mouth daily.    . Omega-3 Fatty Acids (FISH OIL) 1200 MG CAPS Take 1 capsule by mouth daily.    .  pantoprazole (PROTONIX) 40 MG tablet Take 1 tablet (40 mg total) by mouth daily. 90 tablet 3  . Propylene Glycol (SYSTANE BALANCE) 0.6 % SOLN Apply 1 drop to eye daily as needed. Dry Eyes    . psyllium (METAMUCIL) 58.6 % powder Take 1 packet by mouth as needed (constipation).     . simvastatin (ZOCOR) 40 MG tablet Take 40 mg by mouth every evening.    . traZODone (DESYREL) 100 MG tablet Take 50 mg by mouth at bedtime.     . trolamine salicylate (ASPERCREME) 10 % cream Apply 1 application topically as needed for muscle pain.      No current facility-administered medications on file prior to visit.    Past Surgical History  Procedure Laterality Date  . Breast biopsy  1982    left benign  . Partial hysterectomy    . Esophagogastroduodenoscopy  03/02/2012    Dr. Oneida Alar:The mucosa of the esophagus appeared normal Multiple non-bleeding ulcers, ranging between 3-41mm in size were found in the gastric antrum  The duodenal mucosa showed no abnormalities in the bulb and second portion of the duodenum. path negative for H.pylori  . Esophagogastroduodenoscopy N/A 07/15/2014    SLF: 1. No esophageal, gastric, or duodenal varices 2. small 1-2 mm gastric ulcer. Negative H.pylori   . Abdominal hysterectomy    . Colonoscopy  June 2010    Dr. Collene Mares: few scattered sigmoid diverticula, otherwise normal. Screening in 2020   . Esophagogastroduodenoscopy N/A 06/05/2015    SLF: 1. Mild gastritis on ASA and PPI    Denies any headaches, dizziness, double vision, fevers, chills, night sweats, nausea, vomiting, diarrhea, constipation, chest pain, heart palpitations, shortness of breath, blood in stool, black tarry stool, urinary pain, urinary burning, urinary frequency, hematuria.   PHYSICAL EXAMINATION  ECOG PERFORMANCE STATUS: 1 - Symptomatic but completely ambulatory  Filed Vitals:   06/22/15 1009  Pulse: 70  Temp: 97.9 F (36.6 C)  Resp: 18    GENERAL:alert, no distress, well nourished, well developed,  comfortable, cooperative, obese, smiling and unaccompanied  SKIN: skin color, texture, turgor are normal, no rashes or significant lesions HEAD: Normocephalic, No masses, lesions, tenderness or abnormalities EYES: normal, EOMI, Conjunctiva are pink and non-injected EARS: External ears normal OROPHARYNX:lips, buccal mucosa, and tongue normal and mucous membranes are moist  NECK: supple, trachea midline LYMPH:  no palpable lymphadenopathy BREAST:not examined LUNGS: clear to auscultation and percussion HEART: regular rate & rhythm and 1/6,  aortic area and pulmonic area soft murmur ABDOMEN:abdomen soft, obese and normal bowel sounds BACK: Back symmetric, no curvature. EXTREMITIES:less then 2 second capillary refill, no joint deformities, effusion, or inflammation, no skin discoloration, no cyanosis  NEURO: alert & oriented x 3 with fluent speech, no focal motor/sensory deficits, gait normal with a cane for assistance.    LABORATORY DATA: CBC    Component Value Date/Time   WBC 4.6 06/20/2015 1148   RBC 3.71* 06/20/2015 1148   HGB 12.3 06/20/2015 1148   HCT 37.3 06/20/2015 1148   PLT 63* 06/20/2015 1148   MCV 100.5* 06/20/2015 1148   MCH 33.2 06/20/2015 1148   MCHC  33.0 06/20/2015 1148   RDW 12.4 06/20/2015 1148   LYMPHSABS 0.9 06/20/2015 1148   MONOABS 0.3 06/20/2015 1148   EOSABS 0.1 06/20/2015 1148   BASOSABS 0.0 06/20/2015 1148      Chemistry      Component Value Date/Time   NA 140 05/12/2015 0905   K 4.2 05/12/2015 0905   CL 102 05/12/2015 0905   CO2 27 05/12/2015 0905   BUN 8 05/12/2015 0905   CREATININE 0.72 05/12/2015 0905   CREATININE 0.64 12/14/2014 1238      Component Value Date/Time   CALCIUM 9.2 05/12/2015 0905   ALKPHOS 132* 05/12/2015 0905   ALKPHOS 146 09/16/2013   AST 43* 05/12/2015 0905   AST 44 09/16/2013   ALT 29 05/12/2015 0905   ALT 39 09/16/2013   BILITOT 0.8 05/12/2015 0905   BILITOT 0.6 09/16/2013        PENDING  LABS:   RADIOGRAPHIC STUDIES:  No results found.   PATHOLOGY:    ASSESSMENT AND PLAN:  Thrombocytopenia ([place]) Thrombocytopenia in the setting of cirrhosis of liver and splenomegaly with also some intra-abdominal lymphadenopathy.  STABLE.  I personally reviewed and went over laboratory results with the patient.  The results are noted within this dictation.  Labs in 6 months: CBC diff, CMET  Return in 6 months for follow-up.  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  Labs in 6 months: CBC diff, CMET, LDH, SPEP + IFE, and light chain assay.    THERAPY PLAN:  Continue with observation as outlined above.  All questions were answered. The patient knows to call the clinic with any problems, questions or concerns. We can certainly see the patient much sooner if necessary.  Patient and plan discussed with Dr. Ancil Linsey and she is in agreement with the aforementioned.   This note is electronically signed by: Doy Mince 06/22/2015 3:39 PM

## 2015-06-23 DIAGNOSIS — K7460 Unspecified cirrhosis of liver: Secondary | ICD-10-CM | POA: Insufficient documentation

## 2015-06-23 DIAGNOSIS — K7581 Nonalcoholic steatohepatitis (NASH): Secondary | ICD-10-CM

## 2015-06-23 NOTE — Assessment & Plan Note (Signed)
Next labs due 10/2015, u/s 09/2015 with follow up shortly thereafter. Repeat AFP in 3 months as planned.

## 2015-06-23 NOTE — Progress Notes (Signed)
Needs PT/INR, AFP tumor marker in 12/2015 when she goes for labs at the cancer center.

## 2015-06-23 NOTE — Assessment & Plan Note (Signed)
Continue PPI. PUD resolved but with asymptomatic gastritis.

## 2015-06-26 ENCOUNTER — Other Ambulatory Visit: Payer: Self-pay | Admitting: Gastroenterology

## 2015-06-26 DIAGNOSIS — K7581 Nonalcoholic steatohepatitis (NASH): Principal | ICD-10-CM

## 2015-06-26 DIAGNOSIS — K7460 Unspecified cirrhosis of liver: Secondary | ICD-10-CM

## 2015-06-26 NOTE — Progress Notes (Signed)
Lab orders on file. 

## 2015-06-27 NOTE — Progress Notes (Signed)
cc'ed to pcp °

## 2015-07-05 NOTE — Patient Instructions (Signed)
Your procedure is scheduled on:  07/11/2015  Report to [facility] at  68   AM.  Call this number if you have problems the morning of surgery: [phone]   Do not eat food or drink liquids :After Midnight.      Take these medicines the morning of surgery with A SIP OF WATER: protonix, buspirone, prozac, claritin.   Do not wear jewelry, make-up or nail polish.  Do not wear lotions, powders, or perfumes. You may wear deodorant.  Do not shave 48 hours prior to surgery.  Do not bring valuables to the hospital.  Contacts, dentures or bridgework may not be worn into surgery.  Leave suitcase in the car. After surgery it may be brought to your room.  For patients admitted to the hospital, checkout time is 11:00 AM the day of discharge.   Patients discharged the day of surgery will not be allowed to drive home.  :     Please read over the following fact sheets that you were given: Coughing and Deep Breathing, Surgical Site Infection Prevention, Anesthesia Post-op Instructions and Care and Recovery After Surgery    Cataract A cataract is a clouding of the lens of the eye. When a lens becomes cloudy, vision is reduced based on the degree and nature of the clouding. Many cataracts reduce vision to some degree. Some cataracts make people more near-sighted as they develop. Other cataracts increase glare. Cataracts that are ignored and become worse can sometimes look white. The white color can be seen through the pupil. CAUSES   Aging. However, cataracts may occur at any age, even in newborns.   Certain drugs.   Trauma to the eye.   Certain diseases such as diabetes.   Specific eye diseases such as chronic inflammation inside the eye or a sudden attack of a rare form of glaucoma.   Inherited or acquired medical problems.  SYMPTOMS   Gradual, progressive drop in vision in the affected eye.   Severe, rapid visual loss. This most often happens when trauma is the cause.  DIAGNOSIS  To detect a  cataract, an eye doctor examines the lens. Cataracts are best diagnosed with an exam of the eyes with the pupils enlarged (dilated) by drops.  TREATMENT  For an early cataract, vision may improve by using different eyeglasses or stronger lighting. If that does not help your vision, surgery is the only effective treatment. A cataract needs to be surgically removed when vision loss interferes with your everyday activities, such as driving, reading, or watching TV. A cataract may also have to be removed if it prevents examination or treatment of another eye problem. Surgery removes the cloudy lens and usually replaces it with a substitute lens (intraocular lens, IOL).  At a time when both you and your doctor agree, the cataract will be surgically removed. If you have cataracts in both eyes, only one is usually removed at a time. This allows the operated eye to heal and be out of danger from any possible problems after surgery (such as infection or poor wound healing). In rare cases, a cataract may be doing damage to your eye. In these cases, your caregiver may advise surgical removal right away. The vast majority of people who have cataract surgery have better vision afterward. HOME CARE INSTRUCTIONS  If you are not planning surgery, you may be asked to do the following:  Use different eyeglasses.   Use stronger or brighter lighting.   Ask your eye doctor about  reducing your medicine dose or changing medicines if it is thought that a medicine caused your cataract. Changing medicines does not make the cataract go away on its own.   Become familiar with your surroundings. Poor vision can lead to injury. Avoid bumping into things on the affected side. You are at a higher risk for tripping or falling.   Exercise extreme care when driving or operating machinery.   Wear sunglasses if you are sensitive to bright light or experiencing problems with glare.  SEEK IMMEDIATE MEDICAL CARE IF:   You have a  worsening or sudden vision loss.   You notice redness, swelling, or increasing pain in the eye.   You have a fever.  Document Released: 03/18/2005 Document Revised: 03/07/2011 Document Reviewed: 11/09/2010 [facility] Patient Information 2012 [place].PATIENT INSTRUCTIONS POST-ANESTHESIA  IMMEDIATELY FOLLOWING SURGERY:  Do not drive or operate machinery for the first twenty four hours after surgery.  Do not make any important decisions for twenty four hours after surgery or while taking narcotic pain medications or sedatives.  If you develop intractable nausea and vomiting or a severe headache please notify your doctor immediately.  FOLLOW-UP:  Please make an appointment with your surgeon as instructed. You do not need to follow up with anesthesia unless specifically instructed to do so.  WOUND CARE INSTRUCTIONS (if applicable):  Keep a dry clean dressing on the anesthesia/puncture wound site if there is drainage.  Once the wound has quit draining you may leave it open to air.  Generally you should leave the bandage intact for twenty four hours unless there is drainage.  If the epidural site drains for more than 36-48 hours please call the anesthesia department.  QUESTIONS?:  Please feel free to call your physician or the hospital operator if you have any questions, and they will be happy to assist you.

## 2015-07-06 ENCOUNTER — Encounter (HOSPITAL_COMMUNITY)
Admission: RE | Admit: 2015-07-06 | Discharge: 2015-07-06 | Disposition: A | Discharge status: Home / Self Care | Payer: Medicare Other | Source: Ambulatory Visit | Attending: Ophthalmology | Admitting: Ophthalmology

## 2015-07-06 ENCOUNTER — Encounter (HOSPITAL_COMMUNITY): Payer: Self-pay

## 2015-07-06 DIAGNOSIS — Z01812 Encounter for preprocedural laboratory examination: Secondary | ICD-10-CM | POA: Diagnosis not present

## 2015-07-06 DIAGNOSIS — H25011 Cortical age-related cataract, right eye: Secondary | ICD-10-CM | POA: Diagnosis not present

## 2015-07-06 DIAGNOSIS — H2511 Age-related nuclear cataract, right eye: Secondary | ICD-10-CM | POA: Insufficient documentation

## 2015-07-06 LAB — CBC WITH DIFFERENTIAL/PLATELET
BASOS ABS: 0 10*3/uL (ref 0.0–0.1)
BASOS PCT: 0 %
EOS ABS: 0.1 10*3/uL (ref 0.0–0.7)
EOS PCT: 2 %
HCT: 34.4 % — ABNORMAL LOW (ref 36.0–46.0)
Hemoglobin: 11.5 g/dL — ABNORMAL LOW (ref 12.0–15.0)
Lymphocytes Relative: 28 %
Lymphs Abs: 0.9 10*3/uL (ref 0.7–4.0)
MCH: 33.4 pg (ref 26.0–34.0)
MCHC: 33.4 g/dL (ref 30.0–36.0)
MCV: 100 fL (ref 78.0–100.0)
MONO ABS: 0.3 10*3/uL (ref 0.1–1.0)
MONOS PCT: 10 %
Neutro Abs: 2 10*3/uL (ref 1.7–7.7)
Neutrophils Relative %: 60 %
PLATELETS: ADEQUATE 10*3/uL (ref 150–400)
RBC: 3.44 MIL/uL — ABNORMAL LOW (ref 3.87–5.11)
RDW: 13.1 % (ref 11.5–15.5)
WBC: 3.3 10*3/uL — ABNORMAL LOW (ref 4.0–10.5)

## 2015-07-06 LAB — BASIC METABOLIC PANEL
ANION GAP: 6 (ref 5–15)
BUN: 17 mg/dL (ref 6–20)
CALCIUM: 8.7 mg/dL — AB (ref 8.9–10.3)
CO2: 26 mmol/L (ref 22–32)
CREATININE: 0.71 mg/dL (ref 0.44–1.00)
Chloride: 108 mmol/L (ref 101–111)
Glucose, Bld: 97 mg/dL (ref 65–99)
Potassium: 3.9 mmol/L (ref 3.5–5.1)
Sodium: 140 mmol/L (ref 135–145)

## 2015-07-06 NOTE — Pre-Procedure Instructions (Signed)
Patient given information to sign up for my chart at home. 

## 2015-07-10 DIAGNOSIS — J302 Other seasonal allergic rhinitis: Secondary | ICD-10-CM | POA: Diagnosis not present

## 2015-07-10 DIAGNOSIS — Z1389 Encounter for screening for other disorder: Secondary | ICD-10-CM | POA: Diagnosis not present

## 2015-07-10 DIAGNOSIS — H8110 Benign paroxysmal vertigo, unspecified ear: Secondary | ICD-10-CM | POA: Diagnosis not present

## 2015-07-11 ENCOUNTER — Encounter (HOSPITAL_COMMUNITY): Payer: Self-pay | Admitting: Anesthesiology

## 2015-07-11 ENCOUNTER — Ambulatory Visit (HOSPITAL_COMMUNITY): Payer: Medicare Other | Admitting: Anesthesiology

## 2015-07-11 ENCOUNTER — Encounter (HOSPITAL_COMMUNITY)
Admission: RE | Disposition: A | Discharge status: Home / Self Care | Payer: Self-pay | Source: Ambulatory Visit | Attending: Ophthalmology

## 2015-07-11 ENCOUNTER — Ambulatory Visit (HOSPITAL_COMMUNITY)
Admission: RE | Admit: 2015-07-11 | Discharge: 2015-07-11 | Disposition: A | Discharge status: Home / Self Care | Payer: Medicare Other | Source: Ambulatory Visit | Attending: Ophthalmology | Admitting: Ophthalmology

## 2015-07-11 DIAGNOSIS — H25011 Cortical age-related cataract, right eye: Secondary | ICD-10-CM | POA: Diagnosis not present

## 2015-07-11 DIAGNOSIS — Z7982 Long term (current) use of aspirin: Secondary | ICD-10-CM | POA: Insufficient documentation

## 2015-07-11 DIAGNOSIS — E7800 Pure hypercholesterolemia, unspecified: Secondary | ICD-10-CM | POA: Diagnosis not present

## 2015-07-11 DIAGNOSIS — F418 Other specified anxiety disorders: Secondary | ICD-10-CM | POA: Insufficient documentation

## 2015-07-11 DIAGNOSIS — M1991 Primary osteoarthritis, unspecified site: Secondary | ICD-10-CM | POA: Insufficient documentation

## 2015-07-11 DIAGNOSIS — H268 Other specified cataract: Secondary | ICD-10-CM | POA: Diagnosis not present

## 2015-07-11 DIAGNOSIS — Z79899 Other long term (current) drug therapy: Secondary | ICD-10-CM | POA: Insufficient documentation

## 2015-07-11 DIAGNOSIS — H2512 Age-related nuclear cataract, left eye: Secondary | ICD-10-CM | POA: Diagnosis not present

## 2015-07-11 DIAGNOSIS — H25012 Cortical age-related cataract, left eye: Secondary | ICD-10-CM | POA: Diagnosis not present

## 2015-07-11 DIAGNOSIS — H2511 Age-related nuclear cataract, right eye: Secondary | ICD-10-CM | POA: Diagnosis not present

## 2015-07-11 DIAGNOSIS — H269 Unspecified cataract: Secondary | ICD-10-CM | POA: Diagnosis not present

## 2015-07-11 DIAGNOSIS — H5703 Miosis: Secondary | ICD-10-CM | POA: Diagnosis not present

## 2015-07-11 DIAGNOSIS — K219 Gastro-esophageal reflux disease without esophagitis: Secondary | ICD-10-CM | POA: Diagnosis not present

## 2015-07-11 HISTORY — PX: CATARACT EXTRACTION W/PHACO: SHX586

## 2015-07-11 SURGERY — PHACOEMULSIFICATION, CATARACT, WITH IOL INSERTION
Anesthesia: Monitor Anesthesia Care | Site: Eye | Laterality: Right

## 2015-07-11 MED ORDER — MIDAZOLAM HCL 2 MG/2ML IJ SOLN
1.0000 mg | INTRAMUSCULAR | Status: DC | PRN
  Administered 2015-07-11: 1 mg via INTRAVENOUS

## 2015-07-11 MED ORDER — BSS IO SOLN
INTRAOCULAR | Status: DC | PRN
  Administered 2015-07-11: 15 mL

## 2015-07-11 MED ORDER — TETRACAINE 0.5 % OP SOLN OPTIME - NO CHARGE
OPHTHALMIC | Status: DC | PRN
  Administered 2015-07-11: 2 [drp] via OPHTHALMIC

## 2015-07-11 MED ORDER — FENTANYL CITRATE (PF) 100 MCG/2ML IJ SOLN
25.0000 ug | Freq: Once | INTRAMUSCULAR | Status: AC
  Administered 2015-07-11: 25 ug via INTRAVENOUS

## 2015-07-11 MED ORDER — CYCLOPENTOLATE-PHENYLEPHRINE 0.2-1 % OP SOLN
1.0000 [drp] | OPHTHALMIC | Status: AC
  Administered 2015-07-11 (×3): 1 [drp] via OPHTHALMIC

## 2015-07-11 MED ORDER — PHENYLEPHRINE HCL 2.5 % OP SOLN
1.0000 [drp] | OPHTHALMIC | Status: AC
  Administered 2015-07-11 (×3): 1 [drp] via OPHTHALMIC

## 2015-07-11 MED ORDER — LACTATED RINGERS IV SOLN
INTRAVENOUS | Status: DC
  Administered 2015-07-11: 08:00:00 via INTRAVENOUS
  Administered 2015-07-11: 1000 mL via INTRAVENOUS

## 2015-07-11 MED ORDER — PROVISC 10 MG/ML IO SOLN
INTRAOCULAR | Status: DC | PRN
  Administered 2015-07-11: 0.85 mL via INTRAOCULAR

## 2015-07-11 MED ORDER — EPINEPHRINE HCL 1 MG/ML IJ SOLN
INTRAOCULAR | Status: DC | PRN
  Administered 2015-07-11: 500 mL

## 2015-07-11 MED ORDER — KETOROLAC TROMETHAMINE 0.5 % OP SOLN
1.0000 [drp] | OPHTHALMIC | Status: AC
  Administered 2015-07-11 (×3): 1 [drp] via OPHTHALMIC

## 2015-07-11 MED ORDER — TETRACAINE HCL 0.5 % OP SOLN
1.0000 [drp] | OPHTHALMIC | Status: AC
  Administered 2015-07-11 (×3): 1 [drp] via OPHTHALMIC

## 2015-07-11 MED ORDER — EPINEPHRINE HCL 1 MG/ML IJ SOLN
INTRAMUSCULAR | Status: AC
  Filled 2015-07-11: qty 1

## 2015-07-11 MED ORDER — LIDOCAINE HCL (PF) 1 % IJ SOLN
INTRAMUSCULAR | Status: DC | PRN
  Administered 2015-07-11: .5 mL

## 2015-07-11 MED ORDER — LIDOCAINE HCL (PF) 1 % IJ SOLN
INTRAMUSCULAR | Status: AC
  Filled 2015-07-11: qty 2

## 2015-07-11 MED ORDER — MIDAZOLAM HCL 2 MG/2ML IJ SOLN
INTRAMUSCULAR | Status: AC
  Filled 2015-07-11: qty 2

## 2015-07-11 MED ORDER — FENTANYL CITRATE (PF) 100 MCG/2ML IJ SOLN
INTRAMUSCULAR | Status: AC
  Filled 2015-07-11: qty 2

## 2015-07-11 SURGICAL SUPPLY — 14 items
CLOTH BEACON ORANGE TIMEOUT ST (SAFETY) ×3 IMPLANT
EYE SHIELD UNIVERSAL CLEAR (GAUZE/BANDAGES/DRESSINGS) ×3 IMPLANT
GLOVE BIO SURGEON STRL SZ 6.5 (GLOVE) ×2 IMPLANT
GLOVE BIO SURGEONS STRL SZ 6.5 (GLOVE) ×1
GLOVE BIOGEL PI IND STRL 7.0 (GLOVE) ×1 IMPLANT
GLOVE BIOGEL PI INDICATOR 7.0 (GLOVE) ×2
GLOVE EXAM NITRILE MD LF STRL (GLOVE) ×3 IMPLANT
GOWN STRL REUS W/TWL LRG LVL3 (GOWN DISPOSABLE) ×3 IMPLANT
LENS ALC ACRYL/TECN (Ophthalmic Related) ×3 IMPLANT
PAD ARMBOARD 7.5X6 YLW CONV (MISCELLANEOUS) ×3 IMPLANT
RING MALYGIN (MISCELLANEOUS) ×3 IMPLANT
TAPE SURG TRANSPORE 1 IN (GAUZE/BANDAGES/DRESSINGS) ×1 IMPLANT
TAPE SURGICAL TRANSPORE 1 IN (GAUZE/BANDAGES/DRESSINGS) ×2
WATER STERILE IRR 250ML POUR (IV SOLUTION) ×3 IMPLANT

## 2015-07-11 NOTE — Addendum Note (Signed)
Addendum  created 07/11/15 [id number] by Ollen Bowl, CRNA   Modules edited: Anesthesia Flowsheet

## 2015-07-11 NOTE — Discharge Instructions (Signed)
°  °          [facility] Instructions °[address]- [place] [address]-[place] °    ° °1. Avoid closing eyes tightly. One often closes the eye tightly when laughing, talking, sneezing, coughing or if they feel irritated. At these times, you should be careful not to close your eyes tightly. ° °2. Instill eye drops as instructed. To instill drops in your eye, open it, look up and have someone gently pull the lower lid down and instill a couple of drops inside the lower lid. ° °3. Do not touch upper lid. ° °4. Take Advil or Tylenol for pain. ° °5. You may use either eye for near work, such as reading or sewing and you may watch television. ° °6. You may have your hair done at the beauty parlor at any time. ° °7. Wear dark glasses with or without your own glasses if you are in bright light. ° °8. Call our office at [phone] or [phone] if you have sharp pain in your eye or unusual symptoms. ° °9.  FOLLOW UP WITH DR. SHAPIRO TODAY IN HIS [place] OFFICE AT 2:45pm. ° °  °I have received a copy of the above instructions and will follow them.  ° ° ° °IF YOU ARE IN IMMEDIATE DANGER CALL 911! ° °It is important for you to keep your follow-up appointment with your physician after discharge, OR, for you /your caregiver to make a follow-up appointment with your physician / medical provider after discharge. ° °Show these instructions to the next healthcare provider you see. ° °

## 2015-07-11 NOTE — Anesthesia Preprocedure Evaluation (Addendum)
Anesthesia Evaluation  Patient identified by MRN, date of birth, ID band Patient awake    Reviewed: Allergy & Precautions, NPO status , Patient's Chart, lab work & pertinent test results  Airway Mallampati: II  TM Distance: >3 FB     Dental  (+) Teeth Intact   Pulmonary neg pulmonary ROS,    breath sounds clear to auscultation       Cardiovascular  Rhythm:Regular Rate:Normal     Neuro/Psych PSYCHIATRIC DISORDERS Anxiety Depression    GI/Hepatic PUD, GERD  ,(+) Cirrhosis  (NASH)      ,   Endo/Other    Renal/GU      Musculoskeletal  (+) Fibromyalgia -  Abdominal   Peds  Hematology  (+) Blood dyscrasia (thrombocytopenia), ,   Anesthesia Other Findings   Reproductive/Obstetrics                             Anesthesia Physical Anesthesia Plan  ASA: III  Anesthesia Plan: MAC   Post-op Pain Management:    Induction: Intravenous  Airway Management Planned: Nasal Cannula  Additional Equipment:   Intra-op Plan:   Post-operative Plan:   Informed Consent: I have reviewed the patients History and Physical, chart, labs and discussed the procedure including the risks, benefits and alternatives for the proposed anesthesia with the patient or authorized representative who has indicated his/her understanding and acceptance.     Plan Discussed with:   Anesthesia Plan Comments:         Anesthesia Quick Evaluation

## 2015-07-11 NOTE — Anesthesia Postprocedure Evaluation (Signed)
Anesthesia Post Note  Patient: Debbra S Buendia  Procedure(s) Performed: Procedure(s) (LRB): CATARACT EXTRACTION PHACO AND INTRAOCULAR LENS PLACEMENT (IOC) (Right)  Patient location during evaluation: Short Stay Anesthesia Type: MAC Level of consciousness: awake and alert Pain management: pain level controlled Vital Signs Assessment: post-procedure vital signs reviewed and stable Respiratory status: spontaneous breathing Cardiovascular status: blood pressure returned to baseline Postop Assessment: no signs of nausea or vomiting Anesthetic complications: no    Last Vitals:  Filed Vitals:   07/11/15 0725 07/11/15 0730  BP:  129/68  Temp:    Resp: 39 20    Last Pain: There were no vitals filed for this visit.               Jamarian Jacinto

## 2015-07-11 NOTE — Transfer of Care (Signed)
Immediate Anesthesia Transfer of Care Note  Patient: Melanie Porter  Procedure(s) Performed: Procedure(s) with comments: CATARACT EXTRACTION PHACO AND INTRAOCULAR LENS PLACEMENT (IOC) (Right) - CDE: 6.00  Patient Location: Short Stay  Anesthesia Type:MAC  Level of Consciousness: awake  Airway & Oxygen Therapy: Patient Spontanous Breathing  Post-op Assessment: Report given to RN  Post vital signs: Reviewed  Last Vitals:  Filed Vitals:   07/11/15 0725 07/11/15 0730  BP:  129/68  Temp:    Resp: 39 20    Complications: No apparent anesthesia complications

## 2015-07-11 NOTE — Op Note (Signed)
Patient brought to the operating room and prepped and draped in the usual manner.  Lid speculum inserted in right eye.  Stab incision made at the twelve o'clock position.  Provisc instilled in the anterior chamber.   A 2.4 mm. Stab incision was made temporally. Due to a small pupil, a Malugyn Ring was inserted. An anterior capsulotomy was done with a bent 25 gauge needle.  The nucleus was hydrodissected.  The Phaco tip was inserted in the anterior chamber and the nucleus was emulsified.  CDE was 6.00.  The cortical material was then removed with the I and A tip.  Posterior capsule was the polished.  The anterior chamber was deepened with Provisc.  A 20.0 Diopter Hoya Model 250 IOL was then inserted in the capsular bag. The Malugyn Ring was removed.  Provisc was then removed with the I and A tip.  The wound was then hydrated.  Patient sent to the Recovery Room in good condition with follow up in my office.  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

## 2015-07-11 NOTE — H&P (Signed)
The patient was re examined and there is no change in the patients condition since the original H and P. 

## 2015-07-12 ENCOUNTER — Encounter (HOSPITAL_COMMUNITY): Payer: Self-pay | Admitting: Ophthalmology

## 2015-07-21 ENCOUNTER — Encounter (HOSPITAL_COMMUNITY)
Admission: RE | Admit: 2015-07-21 | Discharge: 2015-07-21 | Disposition: A | Discharge status: Home / Self Care | Payer: Medicare Other | Source: Ambulatory Visit | Attending: Ophthalmology | Admitting: Ophthalmology

## 2015-07-21 ENCOUNTER — Encounter (HOSPITAL_COMMUNITY): Payer: Self-pay

## 2015-07-24 MED ORDER — CYCLOPENTOLATE-PHENYLEPHRINE OP SOLN OPTIME - NO CHARGE
OPHTHALMIC | Status: AC
  Filled 2015-07-24: qty 12

## 2015-07-25 ENCOUNTER — Ambulatory Visit (HOSPITAL_COMMUNITY): Payer: Medicare Other | Admitting: Anesthesiology

## 2015-07-25 ENCOUNTER — Encounter (HOSPITAL_COMMUNITY)
Admission: RE | Disposition: A | Discharge status: Home / Self Care | Payer: Self-pay | Source: Ambulatory Visit | Attending: Ophthalmology

## 2015-07-25 ENCOUNTER — Encounter (HOSPITAL_COMMUNITY): Payer: Self-pay | Admitting: Anesthesiology

## 2015-07-25 ENCOUNTER — Ambulatory Visit (HOSPITAL_COMMUNITY)
Admission: RE | Admit: 2015-07-25 | Discharge: 2015-07-25 | Disposition: A | Discharge status: Home / Self Care | Payer: Medicare Other | Source: Ambulatory Visit | Attending: Ophthalmology | Admitting: Ophthalmology

## 2015-07-25 DIAGNOSIS — M1991 Primary osteoarthritis, unspecified site: Secondary | ICD-10-CM | POA: Diagnosis not present

## 2015-07-25 DIAGNOSIS — K219 Gastro-esophageal reflux disease without esophagitis: Secondary | ICD-10-CM | POA: Insufficient documentation

## 2015-07-25 DIAGNOSIS — Z7982 Long term (current) use of aspirin: Secondary | ICD-10-CM | POA: Diagnosis not present

## 2015-07-25 DIAGNOSIS — H269 Unspecified cataract: Secondary | ICD-10-CM | POA: Diagnosis not present

## 2015-07-25 DIAGNOSIS — Z79899 Other long term (current) drug therapy: Secondary | ICD-10-CM | POA: Insufficient documentation

## 2015-07-25 DIAGNOSIS — H268 Other specified cataract: Secondary | ICD-10-CM | POA: Diagnosis not present

## 2015-07-25 DIAGNOSIS — H2512 Age-related nuclear cataract, left eye: Secondary | ICD-10-CM | POA: Diagnosis not present

## 2015-07-25 DIAGNOSIS — M797 Fibromyalgia: Secondary | ICD-10-CM | POA: Insufficient documentation

## 2015-07-25 DIAGNOSIS — E7800 Pure hypercholesterolemia, unspecified: Secondary | ICD-10-CM | POA: Diagnosis not present

## 2015-07-25 DIAGNOSIS — I509 Heart failure, unspecified: Secondary | ICD-10-CM | POA: Diagnosis not present

## 2015-07-25 DIAGNOSIS — H25012 Cortical age-related cataract, left eye: Secondary | ICD-10-CM | POA: Diagnosis not present

## 2015-07-25 HISTORY — PX: CATARACT EXTRACTION W/PHACO: SHX586

## 2015-07-25 SURGERY — PHACOEMULSIFICATION, CATARACT, WITH IOL INSERTION
Anesthesia: Monitor Anesthesia Care | Site: Eye | Laterality: Left

## 2015-07-25 MED ORDER — PHENYLEPHRINE HCL 2.5 % OP SOLN
1.0000 [drp] | OPHTHALMIC | Status: AC
  Administered 2015-07-25 (×3): 1 [drp] via OPHTHALMIC

## 2015-07-25 MED ORDER — TETRACAINE HCL 0.5 % OP SOLN
1.0000 [drp] | OPHTHALMIC | Status: AC
  Administered 2015-07-25 (×3): 1 [drp] via OPHTHALMIC

## 2015-07-25 MED ORDER — LIDOCAINE HCL (PF) 1 % IJ SOLN
INTRAMUSCULAR | Status: AC
  Filled 2015-07-25: qty 2

## 2015-07-25 MED ORDER — CYCLOPENTOLATE-PHENYLEPHRINE 0.2-1 % OP SOLN
1.0000 [drp] | OPHTHALMIC | Status: AC
  Administered 2015-07-25 (×3): 1 [drp] via OPHTHALMIC

## 2015-07-25 MED ORDER — PROVISC 10 MG/ML IO SOLN
INTRAOCULAR | Status: DC | PRN
  Administered 2015-07-25: 0.85 mL via INTRAOCULAR

## 2015-07-25 MED ORDER — MIDAZOLAM HCL 2 MG/2ML IJ SOLN
1.0000 mg | INTRAMUSCULAR | Status: DC | PRN
  Administered 2015-07-25: 2 mg via INTRAVENOUS

## 2015-07-25 MED ORDER — MIDAZOLAM HCL 2 MG/2ML IJ SOLN
INTRAMUSCULAR | Status: AC
  Filled 2015-07-25: qty 2

## 2015-07-25 MED ORDER — TETRACAINE 0.5 % OP SOLN OPTIME - NO CHARGE
OPHTHALMIC | Status: DC | PRN
  Administered 2015-07-25: 2 [drp] via OPHTHALMIC

## 2015-07-25 MED ORDER — ONDANSETRON HCL 4 MG/2ML IJ SOLN: 4.0000 mg | Freq: Once | INTRAMUSCULAR | Status: DC | PRN

## 2015-07-25 MED ORDER — LACTATED RINGERS IV SOLN
INTRAVENOUS | Status: DC
  Administered 2015-07-25: 1000 mL via INTRAVENOUS

## 2015-07-25 MED ORDER — BSS IO SOLN
INTRAOCULAR | Status: DC | PRN
  Administered 2015-07-25: 15 mL

## 2015-07-25 MED ORDER — EPINEPHRINE HCL 1 MG/ML IJ SOLN
INTRAOCULAR | Status: DC | PRN
  Administered 2015-07-25: 500 mL

## 2015-07-25 MED ORDER — FENTANYL CITRATE (PF) 100 MCG/2ML IJ SOLN: 25.0000 ug | INTRAMUSCULAR | Status: DC | PRN

## 2015-07-25 MED ORDER — KETOROLAC TROMETHAMINE 0.5 % OP SOLN
1.0000 [drp] | OPHTHALMIC | Status: AC
  Administered 2015-07-25 (×3): 1 [drp] via OPHTHALMIC

## 2015-07-25 MED ORDER — EPINEPHRINE HCL 1 MG/ML IJ SOLN
INTRAMUSCULAR | Status: AC
  Filled 2015-07-25: qty 1

## 2015-07-25 SURGICAL SUPPLY — 9 items
CLOTH BEACON ORANGE TIMEOUT ST (SAFETY) ×3 IMPLANT
EYE SHIELD UNIVERSAL CLEAR (GAUZE/BANDAGES/DRESSINGS) ×3 IMPLANT
GLOVE ECLIPSE 6.5 STRL STRAW (GLOVE) ×3 IMPLANT
GLOVE EXAM NITRILE MD LF STRL (GLOVE) ×3 IMPLANT
LENS ALC ACRYL/TECN (Ophthalmic Related) ×3 IMPLANT
PAD ARMBOARD 7.5X6 YLW CONV (MISCELLANEOUS) ×3 IMPLANT
TAPE SURG TRANSPORE 1 IN (GAUZE/BANDAGES/DRESSINGS) ×1 IMPLANT
TAPE SURGICAL TRANSPORE 1 IN (GAUZE/BANDAGES/DRESSINGS) ×2
WATER STERILE IRR 250ML POUR (IV SOLUTION) ×3 IMPLANT

## 2015-07-25 NOTE — Anesthesia Postprocedure Evaluation (Signed)
Anesthesia Post Note  Patient: Melanie Porter  Procedure(s) Performed: Procedure(s) (LRB): CATARACT EXTRACTION PHACO AND INTRAOCULAR LENS PLACEMENT (IOC) (Left)  Patient location during evaluation: Short Stay Anesthesia Type: MAC Level of consciousness: awake and alert Pain management: pain level controlled Vital Signs Assessment: post-procedure vital signs reviewed and stable Respiratory status: spontaneous breathing Cardiovascular status: blood pressure returned to baseline Postop Assessment: no signs of nausea or vomiting Anesthetic complications: no    Last Vitals:  Filed Vitals:   07/25/15 0710 07/25/15 0715  BP: 141/61   Pulse:    Temp:    Resp: 20 33    Last Pain: There were no vitals filed for this visit.               Susano Cleckler

## 2015-07-25 NOTE — Transfer of Care (Signed)
Immediate Anesthesia Transfer of Care Note  Patient: Melanie Porter  Procedure(s) Performed: Procedure(s) with comments: CATARACT EXTRACTION PHACO AND INTRAOCULAR LENS PLACEMENT (IOC) (Left) - CDE:8.58  Patient Location: Short Stay  Anesthesia Type:MAC  Level of Consciousness: awake  Airway & Oxygen Therapy: Patient Spontanous Breathing  Post-op Assessment: Report given to RN  Post vital signs: Reviewed  Last Vitals:  Filed Vitals:   07/25/15 0710 07/25/15 0715  BP: 141/61   Pulse:    Temp:    Resp: 20 33    Complications: No apparent anesthesia complications

## 2015-07-25 NOTE — Op Note (Signed)
Patient brought to the operating room and prepped and draped in the usual manner.  Lid speculum inserted in left eye.  Stab incision made at the twelve o'clock position.  Provisc instilled in the anterior chamber.   A 2.4 mm. Stab incision was made temporally.  An anterior capsulotomy was done with a bent 25 gauge needle.  The nucleus was hydrodissected.  The Phaco tip was inserted in the anterior chamber and the nucleus was emulsified.  CDE was 8.58.  The cortical material was then removed with the I and A tip.  Posterior capsule was the polished.  The anterior chamber was deepened with Provisc.  A 20.5 Diopter Hoya Model 250 IOL was then inserted in the capsular bag.  Provisc was then removed with the I and A tip.  The wound was then hydrated.  Patient sent to the Recovery Room in good condition with follow up in my office.  Preoperative Diagnosis:  Cortical and Nuclear Cataract OS Postoperative Diagnosis:  Same Procedure name: Kelman Phacoemulsification OS with IOL

## 2015-07-25 NOTE — H&P (Signed)
The patient was re examined and there is no change in the patients condition since the original H and P. 

## 2015-07-25 NOTE — Discharge Instructions (Signed)
Cataract Surgery, Care After °Refer to this sheet in the next few weeks. These instructions provide you with information on caring for yourself after your procedure. Your caregiver may also give you more specific instructions. Your treatment has been planned according to current medical practices, but problems sometimes occur. Call your caregiver if you have any problems or questions after your procedure.  °HOME CARE INSTRUCTIONS  °· Avoid strenuous activities as directed by your caregiver. °· Ask your caregiver when you can resume driving. °· Use eyedrops or other medicines to help healing and control pressure inside your eye as directed by your caregiver. °· Only take over-the-counter or prescription medicines for pain, discomfort, or fever as directed by your caregiver. °· Do not to touch or rub your eyes. °· You may be instructed to use a protective shield during the first few days and nights after surgery. If not, wear sunglasses to protect your eyes. This is to protect the eye from pressure or from being accidentally bumped. °· Keep the area around your eye clean and dry. Avoid swimming or allowing water to hit you directly in the face while showering. Keep soap and shampoo out of your eyes. °· Do not bend or lift heavy objects. Bending increases pressure in the eye. You can walk, climb stairs, and do light household chores. °· Do not put a contact lens into the eye that had surgery until your caregiver says it is okay to do so. °· Ask your doctor when you can return to work. This will depend on the kind of work that you do. If you work in a dusty environment, you may be advised to wear protective eyewear for a period of time. °· Ask your caregiver when it will be safe to engage in sexual activity. °· Continue with your regular eye exams as directed by your caregiver. °What to expect: °· It is normal to feel itching and mild discomfort for a few days after cataract surgery. Some fluid discharge is also common,  and your eye may be sensitive to light and touch. °· After 1 to 2 days, even moderate discomfort should disappear. In most cases, healing will take about 6 weeks. °· If you received an intraocular lens (IOL), you may notice that colors are very bright or have a blue tinge. Also, if you have been in bright sunlight, everything may appear reddish for a few hours. If you see these color tinges, it is because your lens is clear and no longer cloudy. Within a few months after receiving an IOL, these extra colors should go away. When you have healed, you will probably need new glasses. °SEEK MEDICAL CARE IF:  °· You have increased bruising around your eye. °· You have discomfort not helped by medicine. °SEEK IMMEDIATE MEDICAL CARE IF:  °· You have a  fever. °· You have a worsening or sudden vision loss. °· You have redness, swelling, or increasing pain in the eye. °· You have a thick discharge from the eye that had surgery. °MAKE SURE YOU: °· Understand these instructions. °· Will watch your condition. °· Will get help right away if you are not doing well or get worse. °  °This information is not intended to replace advice given to you by your health care provider. Make sure you discuss any questions you have with your health care provider. °  °Document Released: 10/05/2004 Document Revised: 04/08/2014 Document Reviewed: 11/09/2010 °Elsevier Interactive Patient Education ©2016 Elsevier Inc. ° °

## 2015-07-25 NOTE — Anesthesia Preprocedure Evaluation (Signed)
Anesthesia Evaluation  Patient identified by MRN, date of birth, ID band Patient awake    Reviewed: Allergy & Precautions, NPO status , Patient's Chart, lab work & pertinent test results  Airway Mallampati: III  TM Distance: <3 FB     Dental  (+) Missing,    Pulmonary    Pulmonary exam normal        Cardiovascular Normal cardiovascular exam     Neuro/Psych  Neuromuscular disease    GI/Hepatic PUD, GERD  Medicated and Controlled,(+) Hepatitis -, Unspecified  Endo/Other  Morbid obesity  Renal/GU      Musculoskeletal  (+) Arthritis , Osteoarthritis,  Fibromyalgia -  Abdominal Normal abdominal exam  (+)   Peds  Hematology   Anesthesia Other Findings   Reproductive/Obstetrics                             Anesthesia Physical Anesthesia Plan  ASA: III  Anesthesia Plan: MAC   Post-op Pain Management:    Induction:   Airway Management Planned: Nasal Cannula  Additional Equipment:   Intra-op Plan:   Post-operative Plan:   Informed Consent: I have reviewed the patients History and Physical, chart, labs and discussed the procedure including the risks, benefits and alternatives for the proposed anesthesia with the patient or authorized representative who has indicated his/her understanding and acceptance.   Dental advisory given  Plan Discussed with: CRNA  Anesthesia Plan Comments:         Anesthesia Quick Evaluation

## 2015-07-26 ENCOUNTER — Encounter (HOSPITAL_COMMUNITY): Payer: Self-pay | Admitting: Ophthalmology

## 2015-07-28 ENCOUNTER — Encounter: Payer: Self-pay | Admitting: Gynecology

## 2015-07-28 ENCOUNTER — Ambulatory Visit (INDEPENDENT_AMBULATORY_CARE_PROVIDER_SITE_OTHER): Payer: Medicare Other | Admitting: Gynecology

## 2015-07-28 VITALS — BP 128/78 | Ht 61.0 in | Wt 234.0 lb

## 2015-07-28 DIAGNOSIS — IMO0002 Reserved for concepts with insufficient information to code with codable children: Secondary | ICD-10-CM

## 2015-07-28 DIAGNOSIS — M8580 Other specified disorders of bone density and structure, unspecified site: Secondary | ICD-10-CM | POA: Diagnosis not present

## 2015-07-28 DIAGNOSIS — N8110 Cystocele, unspecified: Secondary | ICD-10-CM | POA: Diagnosis not present

## 2015-07-28 DIAGNOSIS — N3281 Overactive bladder: Secondary | ICD-10-CM | POA: Diagnosis not present

## 2015-07-28 DIAGNOSIS — Z01419 Encounter for gynecological examination (general) (routine) without abnormal findings: Secondary | ICD-10-CM | POA: Diagnosis not present

## 2015-07-28 MED ORDER — MIRABEGRON ER 25 MG PO TB24: 25.0000 mg | ORAL_TABLET | Freq: Every day | ORAL | Status: DC

## 2015-07-28 NOTE — Progress Notes (Signed)
Melanie Porter 1940-05-14 [id number]   History:    75 y.o.  for annual gyn exam with complaints of frequency and nocturia. She has stated that her primary care physician had put on Detrol which she took for about 3 months and discontinued it because it was not helping her.Patient is being followed by her PCP Dr. Hilma Favors who has been doing her blood work. Also the hematologist oncologist has been following her for her thrombocytopenia. Her PCP has been doing her blood work. Patient stated that her last colonoscopy was in 2012 benign polyps of been removed and she is on a 5 year cycle. She also suffers from gastritis. Patient states all her vaccines are up-to-date. Patient's bone density study done in 2016 demonstrated the lowest T score was -1.6 at the right femoral neck. Total right hip bone mineralization decreased by -8.3% when compared with 2014 the rest of the region of interest with no statistically significant change and had a normal Frax analysis.She did have a traumatic fall in 2012 resulting in a back and right ankle fracture. She is taking her calcium and vitamin D. Patient has had a history of transvaginal hysterectomy in the past. Patient denied any prior history of abnormal Pap smear.   Last year mammogram and ultrasound had demonstrated a calcified fibroadenoma of the right breast at the 4:00 position 3-4 finger breast from the nipple and recommendation was to follow-up in one year which she is due next month.  Past medical history,surgical history, family history and social history were all reviewed and documented in the EPIC chart.  Gynecologic History No LMP recorded. Patient has had a hysterectomy. Contraception: status post hysterectomy Last Pap: 2012. Results were: normal Last mammogram: See above. Results were: See above  Obstetric History OB History  Gravida Para Term Preterm AB SAB TAB Ectopic Multiple Living  2 2 2       2     # Outcome Date GA Lbr Len/2nd Weight Sex  Delivery Anes PTL Lv  2 Term     F Vag-Spont  N Y  1 Term     M Vag-Spont  N Y       ROS: A ROS was performed and pertinent positives and negatives are included in the history.  GENERAL: No fevers or chills. HEENT: No change in vision, no earache, sore throat or sinus congestion. NECK: No pain or stiffness. CARDIOVASCULAR: No chest pain or pressure. No palpitations. PULMONARY: No shortness of breath, cough or wheeze. GASTROINTESTINAL: No abdominal pain, nausea, vomiting or diarrhea, melena or bright red blood per rectum. GENITOURINARY: No urinary frequency, urgency, hesitancy or dysuria. MUSCULOSKELETAL: No joint or muscle pain, no back pain, no recent trauma. DERMATOLOGIC: No rash, no itching, no lesions. ENDOCRINE: No polyuria, polydipsia, no heat or cold intolerance. No recent change in weight. HEMATOLOGICAL: No anemia or easy bruising or bleeding. NEUROLOGIC: No headache, seizures, numbness, tingling or weakness. PSYCHIATRIC: No depression, no loss of interest in normal activity or change in sleep pattern.     Exam: chaperone present  BP 128/78 mmHg  Ht 5\' 1"  (1.549 m)  Wt 234 lb (106.142 kg)  BMI 44.24 kg/m2  Body mass index is 44.24 kg/(m^2).  General appearance : Well developed well nourished female. No acute distress HEENT: Eyes: no retinal hemorrhage or exudates,  Neck supple, trachea midline, no carotid bruits, no thyroidmegaly Lungs: Clear to auscultation, no rhonchi or wheezes, or rib retractions  Heart: Regular rate and rhythm, no murmurs or gallops  Breast:Examined in sitting and supine position were symmetrical in appearance, no palpable masses or tenderness,  no skin retraction, no nipple inversion, no nipple discharge, no skin discoloration, no axillary or supraclavicular lymphadenopathy Abdomen: no palpable masses or tenderness, no rebound or guarding Extremities: no edema or skin discoloration or tenderness  Pelvic:  Bartholin, Urethra, Skene Glands: Within normal  limits             Vagina: First-degree cystocele vaginal atrophy  Cervix: Absent  Uterus absent  Adnexa  Without masses or tenderness  Anus and perineum  normal   Rectovaginal  normal sphincter tone without palpated masses or tenderness             Hemoccult PCP provides     Assessment/Plan:  75 y.o. female for annual exam with apparent overactive bladder Detrol did not help. Patient with no history of glaucoma or hypertension will be place on Myrbetriq 25 mg daily. Risks benefits and pros and cons were discussed literature information was provided. Pap smear no longer indicated. Patient was reminded to follow-up with her mammogram next month and to continue her monthly breast exam. She will not need a bone density study until next year. We discussed importance of calcium vitamin D and weightbearing exercises for osteoporosis prevention. She did have benign colon polyp removed in 2012 and she is due for her next colonoscopy this year which she will check with her gastroenterologist.   Terrance Mass MD, 11:20 AM 07/28/2015

## 2015-07-28 NOTE — Patient Instructions (Signed)
Mirabegron extended-release tablets What is this medicine? MIRABEGRON (MIR a BEG ron) is used to treat overactive bladder. This medicine reduces the amount of bathroom visits. It Bonebrake also help to control wetting accidents. This medicine Trost be used for other purposes; ask your health care provider or pharmacist if you have questions. What should I tell my health care provider before I take this medicine? They need to know if you have any of these conditions: -difficulty passing urine -high blood pressure -kidney disease -liver disease -an unusual or allergic reaction to mirabegron, other medicines, foods, dyes, or preservatives -pregnant or trying to get pregnant -breast-feeding How should I use this medicine? Take this medicine by mouth with a glass of water. Follow the directions on the prescription label. Do not cut, crush or chew this medicine. You can take it with or without food. If it upsets your stomach, take it with food. Take your medicine at regular intervals. Do not take it more often than directed. Do not stop taking except on your doctor's advice. Talk to your pediatrician regarding the use of this medicine in children. Special care Jamerson be needed. Overdosage: If you think you have taken too much of this medicine contact a poison control center or emergency room at once. NOTE: This medicine is only for you. Do not share this medicine with others. What if I miss a dose? If you miss a dose, take it as soon as you can. If it is almost time for your next dose, take only that dose. Do not take double or extra doses. What Coccia interact with this medicine? -certain medicines for bladder problems like fesoterodine, oxybutynin, solifenacin, tolterodine -desipramine -digoxin -flecainide -ketoconazole -MAOIs like Carbex, Eldepryl, Marplan, Nardil, and Parnate -metoprolol -propafenone -thioridazine -warfarin This list [place] not describe all possible interactions. Give your health care  provider a list of all the medicines, herbs, non-prescription drugs, or dietary supplements you use. Also tell them if you smoke, drink alcohol, or use illegal drugs. Some items Miotke interact with your medicine. What should I watch for while using this medicine? It Hendel take 8 weeks to notice the full benefit from this medicine. You Cassar need to limit your intake tea, coffee, caffeinated sodas, and alcohol. These drinks Markert make your symptoms worse. Visit your doctor or health care professional for regular checks on your progress. Check your blood pressure as directed. Ask your doctor or health care professional what your blood pressure should be and when you should contact him or her. What side effects Shadoan I notice from receiving this medicine? Side effects that you should report to your doctor or health care professional as soon as possible: -allergic reactions like skin rash, itching or hives, swelling of the face, lips, or tongue -chest pain or palpitations -severe or sudden headache -high blood pressure -fast, irregular heartbeat -redness, blistering, peeling or loosening of the skin, including inside the mouth -signs of infection like fever or chills; cough; sore throat; pain or difficulty passing urine -trouble passing urine or change in the amount of urine Side effects that usually do not require medical attention (Report these to your doctor or health care professional if they continue or are bothersome.): -constipation -diarrhea -dizziness -dry eyes -joint pain -mild headache -nausea -runny nose This list Mundorf not describe all possible side effects. Call your doctor for medical advice about side effects. You Trim report side effects to FDA at 1-800-FDA-1088. Where should I keep my medicine? Keep out of the reach of children. Store   at room temperature between 15 and 30 degrees C (59 and 86 degrees F). Throw away any unused medicine after the expiration date. NOTE: This sheet is a  summary. It may not cover all possible information. If you have questions about this medicine, talk to your doctor, pharmacist, or health care provider.    2016, Elsevier/Gold Standard. (2014-11-17 10:22:20)

## 2015-08-21 ENCOUNTER — Other Ambulatory Visit: Payer: Self-pay

## 2015-08-21 DIAGNOSIS — R978 Other abnormal tumor markers: Secondary | ICD-10-CM

## 2015-09-07 ENCOUNTER — Telehealth: Payer: Self-pay | Admitting: Gastroenterology

## 2015-09-07 NOTE — Telephone Encounter (Signed)
Letter mail out

## 2015-09-07 NOTE — Telephone Encounter (Signed)
Repeat U/S abd in 6 months. Pt is on the July recall

## 2015-09-12 ENCOUNTER — Other Ambulatory Visit (HOSPITAL_COMMUNITY)
Admission: RE | Admit: 2015-09-12 | Discharge: 2015-09-12 | Disposition: A | Discharge status: Home / Self Care | Payer: Medicare Other | Source: Ambulatory Visit | Attending: Gastroenterology | Admitting: Gastroenterology

## 2015-09-12 DIAGNOSIS — R978 Other abnormal tumor markers: Secondary | ICD-10-CM | POA: Insufficient documentation

## 2015-09-13 ENCOUNTER — Other Ambulatory Visit: Payer: Self-pay

## 2015-09-13 DIAGNOSIS — K7460 Unspecified cirrhosis of liver: Secondary | ICD-10-CM

## 2015-09-13 DIAGNOSIS — K7581 Nonalcoholic steatohepatitis (NASH): Principal | ICD-10-CM

## 2015-09-13 LAB — AFP TUMOR MARKER: AFP-Tumor Marker: 5.7 ng/mL (ref 0.0–8.3)

## 2015-09-14 ENCOUNTER — Other Ambulatory Visit: Payer: Self-pay

## 2015-09-14 DIAGNOSIS — K7460 Unspecified cirrhosis of liver: Secondary | ICD-10-CM

## 2015-09-15 NOTE — Progress Notes (Signed)
Quick Note:  PT is aware. ______ 

## 2015-09-15 NOTE — Progress Notes (Signed)
Quick Note:  Lab order on file for 03/2016. ______

## 2015-10-02 ENCOUNTER — Encounter: Payer: Self-pay | Admitting: Gastroenterology

## 2015-10-24 DIAGNOSIS — Z1231 Encounter for screening mammogram for malignant neoplasm of breast: Secondary | ICD-10-CM | POA: Diagnosis not present

## 2015-10-24 DIAGNOSIS — M8589 Other specified disorders of bone density and structure, multiple sites: Secondary | ICD-10-CM | POA: Diagnosis not present

## 2015-11-13 ENCOUNTER — Encounter: Payer: Self-pay | Admitting: Gynecology

## 2015-11-20 DIAGNOSIS — Z0000 Encounter for general adult medical examination without abnormal findings: Secondary | ICD-10-CM | POA: Diagnosis not present

## 2015-11-20 DIAGNOSIS — R81 Glycosuria: Secondary | ICD-10-CM | POA: Diagnosis not present

## 2015-11-20 DIAGNOSIS — Z1389 Encounter for screening for other disorder: Secondary | ICD-10-CM | POA: Diagnosis not present

## 2015-11-23 ENCOUNTER — Ambulatory Visit (INDEPENDENT_AMBULATORY_CARE_PROVIDER_SITE_OTHER): Payer: Medicare Other | Admitting: Gastroenterology

## 2015-11-23 ENCOUNTER — Encounter: Payer: Self-pay | Admitting: Gastroenterology

## 2015-11-23 DIAGNOSIS — K279 Peptic ulcer, site unspecified, unspecified as acute or chronic, without hemorrhage or perforation: Secondary | ICD-10-CM

## 2015-11-23 DIAGNOSIS — K7469 Other cirrhosis of liver: Secondary | ICD-10-CM | POA: Diagnosis not present

## 2015-11-23 DIAGNOSIS — K219 Gastro-esophageal reflux disease without esophagitis: Secondary | ICD-10-CM

## 2015-11-23 NOTE — Assessment & Plan Note (Signed)
SYMPTOMS CONTROLLED/RESOLVED.  CONTINUE TO MONITOR SYMPTOMS. PPI DAILY AS LONG AS SHE TAKES ASA.

## 2015-11-23 NOTE — Patient Instructions (Addendum)
CONTINUE YOUR WEIGHT LOSS EFFORTS.  CONTINUE PROTONIX. TAKE 30 MINUTES PRIOR TO BREAKFAST.  DO NOT TAKE MELOXICAM AND DICLOFENAC IN THE SAME DAY. IT WILL KILL YOUR KIDNEYS.  YOUR MEDICINE FUROSEMIDE IS A FLUID PILL. IT WILL MAKE YOU URINATE.  LET ME KNOW IF YOU CHANGE YOUR MIND ABOUT THE LABS AND ULTRASOUND.  FOLLOW UP IN 1 YEAR.

## 2015-11-23 NOTE — Progress Notes (Signed)
Subjective:    Patient ID: Melanie Porter, female    DOB: [birth year], 75 y.o.   MRN: [id number]  [name], MD  HPI Pt here for routine follow up. Being weaned off some meds. Put on some weight since JAN. NAUSEA, OCCASIONALLY FEELS QUEASY AND SOUR STOMACH IN THE AM. BMS: GOOD. URINATES A LOTA ND MAY HAVE ACCIDENTS. MAY BE SHORT WINDED SOME.   PT DENIES FEVER, CHILLS, HEMATOCHEZIA, HEMATEMESIS, vomiting, melena, diarrhea, CHEST PAIN, CHANGE IN BOWEL IN HABITS, constipation, abdominal pain, problems swallowing, OR heartburn or indigestion.  Past Medical History:  Diagnosis Date  . Ankle fracture, right 05/21/10  . Anxiety   . Arthritis   . Back fracture 05/21/10  . Depression   . Fibromyalgia   . GERD (gastroesophageal reflux disease)   . Hypercholesteremia   . 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 Knee fracture, right 05/21/10  . Nonalcoholic steatohepatitis (NASH) BMI > 40   NOV 2013 CHILD PUGH A, MELD SCORE 7. 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 Obesity   . Splenomegaly 09/23/2012  . Thrombocytopenia ([employer]) 02/19/2012    Past Surgical History:  Procedure Laterality Date  . ABDOMINAL HYSTERECTOMY    . BREAST BIOPSY  1982   left benign  . CATARACT EXTRACTION W/PHACO Right 07/11/2015   Procedure: CATARACT EXTRACTION PHACO AND INTRAOCULAR LENS PLACEMENT (IOC);  Surgeon: Rutherford Guys, MD;  Location: [facility];  Service: Ophthalmology;  Laterality: Right;  CDE: 6.00  . CATARACT EXTRACTION W/PHACO Left 07/25/2015   Procedure: CATARACT EXTRACTION PHACO AND INTRAOCULAR LENS PLACEMENT (IOC);  Surgeon: Rutherford Guys, MD;  Location: [facility];  Service: Ophthalmology;  Laterality: Left;  CDE:8.58  . COLONOSCOPY  June 2010   Dr. Collene Mares: few scattered sigmoid diverticula, otherwise normal. Screening in 2020   . ESOPHAGOGASTRODUODENOSCOPY  03/02/2012   Dr. Oneida Alar:The mucosa of the esophagus appeared normal Multiple  non-bleeding ulcers, ranging between 3-10mm in size were found in the gastric antrum  The duodenal mucosa showed no abnormalities in the bulb and second portion of the duodenum. path negative for H.pylori  . ESOPHAGOGASTRODUODENOSCOPY N/A 07/15/2014   SLF: 1. No esophageal, gastric, or duodenal varices 2. small 1-2 mm gastric ulcer. Negative H.pylori   . ESOPHAGOGASTRODUODENOSCOPY N/A 06/05/2015   SLF: 1. Mild gastritis on ASA and PPI  . PARTIAL HYSTERECTOMY      No Known Allergies Current Outpatient Prescriptions  Medication Sig Dispense Refill  . aspirin EC 81 MG tablet Take 81 mg by mouth at bedtime.    . bisacodyl (DULCOLAX) 5 MG EC tablet Take 10 mg by mouth daily as needed. Constipation    . Bromfenac Sodium 0.07 % SOLN     . busPIRone (BUSPAR) 5 MG tablet Take 1 tablet by mouth 3 (three) times daily.    . Calcium Carbonate-Vit D-Min (CALTRATE PLUS PO) Take 1 tablet by mouth daily.     . Diclofenac Sodium CR 100 MG 24 hr tablet Take 100 mg by mouth daily.    Marland Kitchen docusate sodium (COLACE) 100 MG capsule Take 100 mg by mouth daily as needed for mild constipation.     Marland Kitchen FLUoxetine (PROZAC) 40 MG capsule Take 40 mg by mouth daily.      . furosemide (LASIX) 20 MG tablet Take 1 tablet (20 mg total) by mouth daily. Take prn as needed for swelling    . loratadine (CLARITIN) 10 MG tablet Take 10 mg  by mouth as needed for allergies.    . Magnesium Hydroxide (MILK OF MAGNESIA PO) Take 1 tablet by mouth at bedtime.    . meloxicam (MOBIC) 7.5 MG tablet Take 7.5 mg by mouth daily.     . Multiple Vitamin (MULTIVITAMIN WITH MINERALS) TABS tablet Take 1 tablet by mouth daily.    Marland Kitchen ofloxacin (OCUFLOX) 0.3 % ophthalmic solution     . ofloxacin (OCUFLOX) 0.3 % ophthalmic solution PUT 1 DROP TWICE DAILY IN OPERATIVE EYE STARTING 2 DAYS BEFORE SU...  (REFER TO PRESCRIPTION NOTES).    . pantoprazole (PROTONIX) 40 MG tablet Take 1 tablet (40 mg total) by mouth daily. 30 MINS MAYBE   . Propylene Glycol (SYSTANE  BALANCE) 0.6 % SOLN Apply 1 drop to eye daily as needed. Dry Eyes    . psyllium (METAMUCIL) 58.6 % powder Take 1 packet by mouth as needed (constipation).     . simvastatin (ZOCOR) 40 MG tablet Take 40 mg by mouth every evening.    . traZODone (DESYREL) 100 MG tablet Take 50 mg by mouth at bedtime.     . trolamine salicylate (ASPERCREME) 10 % cream Apply 1 application topically as needed for muscle pain.     . Cyanocobalamin (VITAMIN B 12 PO) Take 2,000 mcg by mouth daily.    . Flaxseed, Linseed, (FLAXSEED OIL) 1000 MG CAPS Take 1,000 mg by mouth 2 (two) times daily.     . mirabegron ER (MYRBETRIQ) 25 MG TB24 tablet Take 1 tablet (25 mg total) by mouth daily. (Patient not taking: Reported on 11/23/2015)    . Omega-3 Fatty Acids (FISH OIL) 1200 MG CAPS Take 1 capsule by mouth daily.    Marland Kitchen PROLENSA 0.07 % SOLN PUT 1 DROP IN OPERATIVE EYE DAILY STARTING 2 DAYS BEFORE SURGERY ...  (REFER TO PRESCRIPTION NOTES).     Review of Systems PER HPI OTHERWISE ALL SYSTEMS ARE NEGATIVE.    Objective:   Physical Exam  Constitutional: She is oriented to person, place, and time. She appears well-developed and well-nourished. No distress.  HENT:  Head: Normocephalic and atraumatic.  Mouth/Throat: Oropharynx is clear and moist. No oropharyngeal exudate.  Eyes: Pupils are equal, round, and reactive to light. No scleral icterus.  Neck: Normal range of motion. Neck supple.  Cardiovascular: Normal rate, regular rhythm and normal heart sounds.   Pulmonary/Chest: Effort normal and breath sounds normal. No respiratory distress.  Abdominal: Soft. Bowel sounds are normal. She exhibits no distension. There is no tenderness.  Musculoskeletal: She exhibits edema.  BILATERAL LOWER EXTREMITIES: trace, difficult for PATIENT TO GET UP ON EXAM TABLE   Lymphadenopathy:    She has no cervical adenopathy.  Neurological: She is alert and oriented to person, place, and time.  NO  NEW FOCAL DEFICITS  Psychiatric: She has a  normal mood and affect.  Vitals reviewed.     Assessment & Plan:

## 2015-11-23 NOTE — Assessment & Plan Note (Signed)
SYMPTOMS FAIRLY WELL CONTROLLED.  CONTINUE PROTONIX. TAKE 30 MINUTES PRIOR TO BREAKFAST. CONTINUE YOUR WEIGHT LOSS EFFORTS.

## 2015-11-23 NOTE — Progress Notes (Signed)
cc'ed to pcp °

## 2015-11-23 NOTE — Assessment & Plan Note (Signed)
PT NOT INTERESETD IN LABS OR [place] FOR ROUTINE SURVEILLANCE. SHE FEELS LIKE SHE HAS LIVED INTO HER 70S AND THAT'S GOOD ENOUGH FOR HER.  PT WILL LET ME KNOW IF SHE CHANGES HER MIND ABOUT LABS AND [place]. AFP UP TO DATE. LAST [place] JAN 2017.  FOLLOW UP IN 1 YEAR PER PT REQUEST. SHE UNDERSTANDS SHE SHOULD HAVE LABS/US/OPV Q6MOS.

## 2015-11-27 ENCOUNTER — Other Ambulatory Visit: Payer: Self-pay

## 2015-11-27 DIAGNOSIS — K7460 Unspecified cirrhosis of liver: Secondary | ICD-10-CM

## 2015-11-27 DIAGNOSIS — K7581 Nonalcoholic steatohepatitis (NASH): Principal | ICD-10-CM

## 2015-11-27 NOTE — Progress Notes (Signed)
ON RECALL  °

## 2015-11-28 DIAGNOSIS — Z961 Presence of intraocular lens: Secondary | ICD-10-CM | POA: Diagnosis not present

## 2015-12-22 ENCOUNTER — Encounter (HOSPITAL_COMMUNITY): Payer: Medicare Other | Attending: Hematology & Oncology

## 2015-12-22 DIAGNOSIS — M1990 Unspecified osteoarthritis, unspecified site: Secondary | ICD-10-CM | POA: Insufficient documentation

## 2015-12-22 DIAGNOSIS — D472 Monoclonal gammopathy: Secondary | ICD-10-CM | POA: Diagnosis not present

## 2015-12-22 DIAGNOSIS — K219 Gastro-esophageal reflux disease without esophagitis: Secondary | ICD-10-CM | POA: Insufficient documentation

## 2015-12-22 DIAGNOSIS — R161 Splenomegaly, not elsewhere classified: Secondary | ICD-10-CM | POA: Insufficient documentation

## 2015-12-22 DIAGNOSIS — E669 Obesity, unspecified: Secondary | ICD-10-CM | POA: Diagnosis not present

## 2015-12-22 DIAGNOSIS — D696 Thrombocytopenia, unspecified: Secondary | ICD-10-CM | POA: Insufficient documentation

## 2015-12-22 DIAGNOSIS — F419 Anxiety disorder, unspecified: Secondary | ICD-10-CM | POA: Insufficient documentation

## 2015-12-22 DIAGNOSIS — F329 Major depressive disorder, single episode, unspecified: Secondary | ICD-10-CM | POA: Insufficient documentation

## 2015-12-22 DIAGNOSIS — M797 Fibromyalgia: Secondary | ICD-10-CM | POA: Insufficient documentation

## 2015-12-22 DIAGNOSIS — M8580 Other specified disorders of bone density and structure, unspecified site: Secondary | ICD-10-CM

## 2015-12-22 DIAGNOSIS — E7800 Pure hypercholesterolemia, unspecified: Secondary | ICD-10-CM | POA: Diagnosis not present

## 2015-12-22 DIAGNOSIS — E039 Hypothyroidism, unspecified: Secondary | ICD-10-CM | POA: Diagnosis not present

## 2015-12-22 DIAGNOSIS — Z6841 Body Mass Index (BMI) 40.0 and over, adult: Secondary | ICD-10-CM | POA: Insufficient documentation

## 2015-12-22 DIAGNOSIS — K7460 Unspecified cirrhosis of liver: Secondary | ICD-10-CM | POA: Insufficient documentation

## 2015-12-22 DIAGNOSIS — D691 Qualitative platelet defects: Secondary | ICD-10-CM

## 2015-12-22 LAB — COMPREHENSIVE METABOLIC PANEL
ALK PHOS: 129 U/L — AB (ref 38–126)
ALT: 32 U/L (ref 14–54)
AST: 48 U/L — AB (ref 15–41)
Albumin: 3.2 g/dL — ABNORMAL LOW (ref 3.5–5.0)
Anion gap: 5 (ref 5–15)
BILIRUBIN TOTAL: 0.7 mg/dL (ref 0.3–1.2)
BUN: 11 mg/dL (ref 6–20)
CALCIUM: 9 mg/dL (ref 8.9–10.3)
CHLORIDE: 107 mmol/L (ref 101–111)
CO2: 27 mmol/L (ref 22–32)
CREATININE: 0.67 mg/dL (ref 0.44–1.00)
GFR calc Af Amer: >60 mL/min (ref >60)
Glucose, Bld: 108 mg/dL — ABNORMAL HIGH (ref 65–99)
Potassium: 3.8 mmol/L (ref 3.5–5.1)
Sodium: 139 mmol/L (ref 135–145)
Total Protein: 6.8 g/dL (ref 6.5–8.1)

## 2015-12-22 LAB — CBC WITH DIFFERENTIAL/PLATELET
Basophils Absolute: 0 10*3/uL (ref 0.0–0.1)
Basophils Relative: 0 %
Eosinophils Absolute: 0.2 10*3/uL (ref 0.0–0.7)
Eosinophils Relative: 3 %
HCT: 37.5 % (ref 36.0–46.0)
Hemoglobin: 12.3 g/dL (ref 12.0–15.0)
Lymphocytes Relative: 25 %
Lymphs Abs: 1.1 10*3/uL (ref 0.7–4.0)
MCH: 33.2 pg (ref 26.0–34.0)
MCHC: 32.8 g/dL (ref 30.0–36.0)
MCV: 101.1 fL — ABNORMAL HIGH (ref 78.0–100.0)
Monocytes Absolute: 0.4 10*3/uL (ref 0.1–1.0)
Monocytes Relative: 10 %
Neutro Abs: 2.7 10*3/uL (ref 1.7–7.7)
Neutrophils Relative %: 62 %
Platelets: ADEQUATE 10*3/uL (ref 150–400)
RBC: 3.71 MIL/uL — ABNORMAL LOW (ref 3.87–5.11)
RDW: 12.9 % (ref 11.5–15.5)
WBC: 4.4 10*3/uL (ref 4.0–10.5)

## 2015-12-22 LAB — LACTATE DEHYDROGENASE: LDH: 132 U/L (ref 98–192)

## 2015-12-23 LAB — IGG, IGA, IGM
IgA: 362 mg/dL (ref 64–422)
IgG (Immunoglobin G), Serum: 1550 mg/dL (ref 700–1600)
IgM, Serum: 270 mg/dL — ABNORMAL HIGH (ref 26–217)

## 2015-12-25 ENCOUNTER — Ambulatory Visit (HOSPITAL_COMMUNITY): Payer: Medicare Other | Admitting: Oncology

## 2015-12-25 DIAGNOSIS — E039 Hypothyroidism, unspecified: Secondary | ICD-10-CM | POA: Diagnosis not present

## 2015-12-25 DIAGNOSIS — R5383 Other fatigue: Secondary | ICD-10-CM | POA: Diagnosis not present

## 2015-12-25 DIAGNOSIS — Z23 Encounter for immunization: Secondary | ICD-10-CM | POA: Diagnosis not present

## 2015-12-25 DIAGNOSIS — E782 Mixed hyperlipidemia: Secondary | ICD-10-CM | POA: Diagnosis not present

## 2015-12-25 LAB — PROTEIN ELECTROPHORESIS, SERUM
A/G RATIO SPE: 1 (ref 0.7–1.7)
ALPHA-2-GLOBULIN: 0.5 g/dL (ref 0.4–1.0)
Albumin ELP: 3.3 g/dL (ref 2.9–4.4)
Alpha-1-Globulin: 0.2 g/dL (ref 0.0–0.4)
BETA GLOBULIN: 0.9 g/dL (ref 0.7–1.3)
GAMMA GLOBULIN: 1.7 g/dL (ref 0.4–1.8)
Globulin, Total: 3.4 g/dL (ref 2.2–3.9)
M-Spike, %: 0.2 g/dL — ABNORMAL HIGH
Total Protein ELP: 6.7 g/dL (ref 6.0–8.5)

## 2015-12-25 LAB — KAPPA/LAMBDA LIGHT CHAINS
KAPPA, LAMDA LIGHT CHAIN RATIO: 0.05 — AB (ref 0.26–1.65)
Kappa free light chain: 28.6 mg/L — ABNORMAL HIGH (ref 3.3–19.4)
LAMDA FREE LIGHT CHAINS: 598.8 mg/L — AB (ref 5.7–26.3)

## 2015-12-27 LAB — IMMUNOFIXATION ELECTROPHORESIS
IGA: 376 mg/dL (ref 64–422)
IgG (Immunoglobin G), Serum: 1414 mg/dL (ref 700–1600)
IgM, Serum: 270 mg/dL — ABNORMAL HIGH (ref 26–217)
Total Protein ELP: 6.6 g/dL (ref 6.0–8.5)

## 2015-12-28 ENCOUNTER — Encounter (HOSPITAL_BASED_OUTPATIENT_CLINIC_OR_DEPARTMENT_OTHER): Payer: Medicare Other | Admitting: Oncology

## 2015-12-28 ENCOUNTER — Telehealth (HOSPITAL_COMMUNITY): Payer: Self-pay | Admitting: *Deleted

## 2015-12-28 ENCOUNTER — Encounter (HOSPITAL_COMMUNITY): Payer: Self-pay | Admitting: Oncology

## 2015-12-28 DIAGNOSIS — D472 Monoclonal gammopathy: Secondary | ICD-10-CM

## 2015-12-28 DIAGNOSIS — D696 Thrombocytopenia, unspecified: Secondary | ICD-10-CM

## 2015-12-28 NOTE — Patient Instructions (Signed)
[place] at [facility] Discharge Instructions  RECOMMENDATIONS MADE BY THE CONSULTANT AND ANY TEST RESULTS WILL BE SENT TO YOUR REFERRING PHYSICIAN.  You were seen by Gershon Mussel today. Return in 6 months for follow up with labs  Thank you for choosing [place] at [facility] to provide your oncology and hematology care.  To afford each patient quality time with our provider, please arrive at least 15 minutes before your scheduled appointment time.   Beginning January 23rd 2017 lab work for the [facility] will be done in the  Main lab at [facility] on 1st floor. If you have a lab appointment with the [facility] please come in thru the  Main Entrance and check in at the main information desk  You need to re-schedule your appointment should you arrive 10 or more minutes late.  We strive to give you quality time with our providers, and arriving late affects you and other patients whose appointments are after yours.  Also, if you no show three or more times for appointments you may be dismissed from the clinic at the providers discretion.     Again, thank you for choosing [facility].  Our hope is that these requests will decrease the amount of time that you wait before being seen by our physicians.       _____________________________________________________________  Should you have questions after your visit to [facility], please contact our office at [phone] between the hours of 8:30 a.m. and 4:30 p.m.  Voicemails left after 4:30 p.m. will not be returned until the following business day.  For prescription refill requests, have your pharmacy contact our office.         Resources For Cancer Patients and their Caregivers ? American Cancer Society: Can assist with transportation, wigs, general needs, runs Look Good Feel Better.        701-519-4342 ? Cancer Care: Provides financial assistance, online support  groups, medication/co-pay assistance.  1-800-813-HOPE 9523885580) ? [facility] Assists [place] cancer patients and their families through emotional , educational and financial support.  (951)020-5853 ? [place] DSS Where to apply for food stamps, Medicaid and utility assistance. 365-170-0139 ? [facility]: Transportation to medical appointments. 407 331 7244 ? Social Security Administration: May apply for disability if have a Stage IV cancer. 681-301-2011 929-418-4284 ? [employer], Disability and Transit Services: Assists with nutrition, care and transit needs. [facility] Support Programs: @10RELATIVEDAYS @ > Cancer Support Group  2nd Tuesday of the month 1pm-2pm, Journey Room  > Creative Journey  3rd Tuesday of the month 1130am-1pm, Journey Room  > Look Good Feel Better  1st Wednesday of the month 10am-12 noon, Journey Room (Call [facility] to register [phone])

## 2015-12-28 NOTE — Progress Notes (Signed)
Melanie Kilts, MD [place] [address]  Thrombocytopenia [facility]) - Plan: CBC with Differential, Comprehensive metabolic panel  IgM monoclonal gammopathy of uncertain significance - Plan: CBC with Differential, Comprehensive metabolic panel, Lactate dehydrogenase, Kappa/lambda light chains, IgG, IgA, IgM, Immunofixation electrophoresis, Protein electrophoresis, serum  CURRENT THERAPY: Observation  INTERVAL HISTORY: Melanie Porter 75 y.o. female returns for followup of thrombocytopenia in the setting of cirrhosis of liver and splenomegaly. AND IgM MGUS    The patient reports that seh was recently diagnosed with Hypothyroidism.  She has been prescribed levothyroxine and this is being managed by her primary care provider.  She recently started Levothyroxine (within the past week).  She recently has cataracts removed by Dr. Gershon Crane.    She denies any B symptoms.    Review of Systems  Constitutional: Negative.  Negative for chills, fever, malaise/fatigue and weight loss.  HENT: Negative.   Respiratory: Negative.  Negative for cough.   Cardiovascular: Negative.  Negative for chest pain.  Gastrointestinal: Negative.  Negative for abdominal pain, constipation, diarrhea, nausea and vomiting.  Genitourinary: Negative.  Negative for dysuria.  Musculoskeletal: Negative for falls.  Skin: Negative.   Neurological: Negative.  Negative for weakness.  Endo/Heme/Allergies: Negative.   Psychiatric/Behavioral: Negative.     Past Medical History:  Diagnosis Date  . Ankle fracture, right 05/21/10  . Anxiety   . Arthritis   . Back fracture 05/21/10  . Depression   . Fibromyalgia   . GERD (gastroesophageal reflux disease)   . Hypercholesteremia   . 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 Knee fracture, right 05/21/10  . Nonalcoholic steatohepatitis (NASH) BMI > 40   NOV 2013 CHILD PUGH A, MELD SCORE 7. 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 Obesity   . Splenomegaly 09/23/2012  . Thrombocytopenia ([place]) 02/19/2012    Past Surgical History:  Procedure Laterality Date  . ABDOMINAL HYSTERECTOMY    . BREAST BIOPSY  1982   left benign  . CATARACT EXTRACTION W/PHACO Right 07/11/2015   Procedure: CATARACT EXTRACTION PHACO AND INTRAOCULAR LENS PLACEMENT (IOC);  Surgeon: Rutherford Guys, MD;  Location: [facility];  Service: Ophthalmology;  Laterality: Right;  CDE: 6.00  . CATARACT EXTRACTION W/PHACO Left 07/25/2015   Procedure: CATARACT EXTRACTION PHACO AND INTRAOCULAR LENS PLACEMENT (IOC);  Surgeon: Rutherford Guys, MD;  Location: [facility];  Service: Ophthalmology;  Laterality: Left;  CDE:8.58  . COLONOSCOPY  June 2010   Dr. Collene Mares: few scattered sigmoid diverticula, otherwise normal. Screening in 2020   . ESOPHAGOGASTRODUODENOSCOPY  03/02/2012   Dr. Oneida Alar:The mucosa of the esophagus appeared normal Multiple non-bleeding ulcers, ranging between 3-56m in size were found in the gastric antrum  The duodenal mucosa showed no abnormalities in the bulb and second portion of the duodenum. path negative for H.pylori  . ESOPHAGOGASTRODUODENOSCOPY N/A 07/15/2014   SLF: 1. No esophageal, gastric, or duodenal varices 2. small 1-2 mm gastric ulcer. Negative H.pylori   . ESOPHAGOGASTRODUODENOSCOPY N/A 06/05/2015   SLF: 1. Mild gastritis on ASA and PPI  . PARTIAL HYSTERECTOMY      Family History  Problem Relation Age of Onset  . Heart disease Father   . Heart attack Father   . Aneurysm Mother   . Diabetes Sister   . Hypertension Sister   . Heart disease Sister   . Hypertension Brother   . Heart disease Brother   . Colon  cancer Neg Hx     Social History   Social History  . Marital status: Married    Spouse name: N/A  . Number of children: N/A  . Years of education: N/A   Occupational History  . 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 Smoking  status: Never Smoker  . Smokeless tobacco: Never Used     Comment: Never smoked  . Alcohol use No  . Drug use: No  . Sexual activity: No   Other Topics Concern  . None   Social History Narrative   HAS A HUSBAND WHO MAY HAVE DEMENTIA AND IS CHALLENGING TO CARE FOR.     PHYSICAL EXAMINATION  ECOG PERFORMANCE STATUS: 1 - Symptomatic but completely ambulatory  There were no vitals filed for this visit.  BP 131/44 P 71 R 18 T 97.9 O2 sat 95% on RA.  GENERAL:alert, no distress, well nourished, well developed, comfortable, cooperative, obese and unaccompanied SKIN: skin color, texture, turgor are normal, no rashes or significant lesions HEAD: Normocephalic, No masses, lesions, tenderness or abnormalities EYES: normal, EOMI, Conjunctiva are pink and non-injected EARS: External ears normal OROPHARYNX:lips, buccal mucosa, and tongue normal and mucous membranes are moist  NECK: supple, no adenopathy, trachea midline LYMPH:  no palpable lymphadenopathy BREAST:not examined LUNGS: clear to auscultation  HEART: regular rate & rhythm and systolic ejection murmur, soft. ABDOMEN:abdomen soft, obese and normal bowel sounds BACK: Back symmetric, no curvature. EXTREMITIES:less then 2 second capillary refill, no joint deformities, effusion, or inflammation, no skin discoloration, no cyanosis  NEURO: alert & oriented x 3 with fluent speech, no focal motor/sensory deficits, gait normal using a cane for balance.   LABORATORY DATA: CBC    Component Value Date/Time   WBC 4.4 12/22/2015 0907   RBC 3.71 (L) 12/22/2015 0907   HGB 12.3 12/22/2015 0907   HCT 37.5 12/22/2015 0907   PLT  12/22/2015 0907    PLATELET CLUMPS NOTED ON SMEAR, COUNT APPEARS ADEQUATE   MCV 101.1 (H) 12/22/2015 0907   MCH 33.2 12/22/2015 0907   MCHC 32.8 12/22/2015 0907   RDW 12.9 12/22/2015 0907   LYMPHSABS 1.1 12/22/2015 0907   MONOABS 0.4 12/22/2015 0907   EOSABS 0.2 12/22/2015 0907   BASOSABS 0.0 12/22/2015 0907       Chemistry      Component Value Date/Time   NA 139 12/22/2015 0907   K 3.8 12/22/2015 0907   CL 107 12/22/2015 0907   CO2 27 12/22/2015 0907   BUN 11 12/22/2015 0907   CREATININE 0.67 12/22/2015 0907   CREATININE 0.72 05/12/2015 0905      Component Value Date/Time   CALCIUM 9.0 12/22/2015 0907   ALKPHOS 129 (H) 12/22/2015 0907   ALKPHOS 146 09/16/2013   AST 48 (H) 12/22/2015 0907   AST 44 09/16/2013   ALT 32 12/22/2015 0907   ALT 39 09/16/2013   BILITOT 0.7 12/22/2015 0907   BILITOT 0.6 09/16/2013     Lab Results  Component Value Date   PROT 6.8 12/22/2015   ALBUMINELP 3.3 12/22/2015   A1GS 0.2 12/22/2015   A2GS 0.5 12/22/2015   BETS 0.9 12/22/2015   BETA2SER 7.1 (H) 06/15/2014   GAMS 1.7 12/22/2015   MSPIKE 0.2 (H) 12/22/2015   SPEI Comment 12/22/2015   SPECOM Comment 12/22/2015   IGGSERUM 1,550 12/22/2015   IGGSERUM 1,414 12/22/2015   IGA 362 12/22/2015   IGA 376 12/22/2015  IGMSERUM 270 (H) 12/22/2015   IGMSERUM 270 (H) 12/22/2015   IMMELINT (NOTE) 06/15/2014   KPAFRELGTCHN 28.6 (H) 12/22/2015   LAMBDASER 598.8 (H) 12/22/2015   KAPLAMBRATIO 0.05 (L) 12/22/2015      PENDING LABS:   RADIOGRAPHIC STUDIES:  No results found.   PATHOLOGY:    ASSESSMENT AND PLAN:  Thrombocytopenia ([place]) Thrombocytopenia in the setting of cirrhosis of liver and splenomegaly with also some intra-abdominal lymphadenopathy.  STABLE.  Labs on 12/22/2015 are reviewed: CBC diff, CMET.  I personally reviewed and went over laboratory results with the patient.  The results are noted within this dictation.  GI note is reviewed.  Patient has reviewed ongoing surveillance of cirrhosis ([place] and AFP).  I personally reviewed and went over radiographic studies with the patient.  The results are noted within this dictation.  Mammogram on 10/24/2015 is negative.  She was recently diagnosed with Hypothyroidism by her primary care provider.  She started Levothyroxine within the  past week.  She has follow-up accordingly for this condition, the patient reports.  She plans on getting an influenza vaccine today at local drug store.  Labs in 6 months: CBC diff, CMET  Return in 6 months for follow-up.  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  Labs from 12/22/2015 are reviewed: CBC diff, CMET, SPEP+IFE, light chain assay.  I personally reviewed and went over laboratory results with the patient.  The results are noted within this dictation.  No signs of CRAB findings:   C= Calcium increase of greater than 11.5 mg/dL R = Renal insufficiency with creatinine > 2 mg/dL A= Anemia with Hgb < 10 g/dL B= Bone disease with lytic lesions and/or osteopenia  Labs in 6 months: CBC diff, CMET, LDH, SPEP + IFE, and light chain assay.   ORDERS PLACED FOR THIS ENCOUNTER: Orders Placed This Encounter  Procedures  . CBC with Differential  . Comprehensive metabolic panel  . Lactate dehydrogenase  . Kappa/lambda light chains  . IgG, IgA, IgM  . Immunofixation electrophoresis  . Protein electrophoresis, serum    MEDICATIONS PRESCRIBED THIS ENCOUNTER: Meds ordered this encounter  Medications  . levothyroxine (SYNTHROID, LEVOTHROID) 50 MCG tablet    Sig: Take 50 mcg by mouth daily.    Refill:  0  . FLUoxetine (PROZAC) 20 MG capsule    Sig: Take 20 mg by mouth daily.    Refill:  0  . levothyroxine (SYNTHROID, LEVOTHROID) 25 MCG tablet    Sig: Take 25 mcg by mouth daily.    Refill:  0  . traMADol (ULTRAM-ER) 100 MG 24 hr tablet    Refill:  0  . traZODone (DESYREL) 50 MG tablet    Sig: Take 50 mg by mouth at bedtime.    Refill:  0    THERAPY PLAN:  We will be on the look out for transformation into active multiple myeloma.  Ongoing surveillance is recommended at this time.  All questions were answered. The patient knows to call the clinic with any problems, questions or concerns. We can  certainly see the patient much sooner if necessary.  Patient and plan discussed with Dr. Ancil Linsey and she is in agreement with the aforementioned.   This note is electronically signed by: Doy Mince 12/28/2015 10:04 AM

## 2015-12-28 NOTE — Telephone Encounter (Signed)
Pt aware of lab results 

## 2015-12-28 NOTE — Telephone Encounter (Signed)
-----   Message from [facility], PA-C sent at 12/27/2015  6:22 PM EDT ----- Stable

## 2015-12-28 NOTE — Assessment & Plan Note (Addendum)
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  Labs from 12/22/2015 are reviewed: CBC diff, CMET, SPEP+IFE, light chain assay.  I personally reviewed and went over laboratory results with the patient.  The results are noted within this dictation.  No signs of CRAB findings:   C= Calcium increase of greater than 11.5 mg/dL R = Renal insufficiency with creatinine > 2 mg/dL A= Anemia with Hgb < 10 g/dL B= Bone disease with lytic lesions and/or osteopenia  Labs in 6 months: CBC diff, CMET, LDH, SPEP + IFE, and light chain assay.

## 2015-12-28 NOTE — Assessment & Plan Note (Addendum)
Thrombocytopenia in the setting of cirrhosis of liver and splenomegaly with also some intra-abdominal lymphadenopathy.  STABLE.  Labs on 12/22/2015 are reviewed: CBC diff, CMET.  I personally reviewed and went over laboratory results with the patient.  The results are noted within this dictation.  GI note is reviewed.  Patient has reviewed ongoing surveillance of cirrhosis ([place] and AFP).  I personally reviewed and went over radiographic studies with the patient.  The results are noted within this dictation.  Mammogram on 10/24/2015 is negative.  She was recently diagnosed with Hypothyroidism by her primary care provider.  She started Levothyroxine within the past week.  She has follow-up accordingly for this condition, the patient reports.  She plans on getting an influenza vaccine today at local drug store.  Labs in 6 months: CBC diff, CMET  Return in 6 months for follow-up.

## 2016-01-31 DIAGNOSIS — R748 Abnormal levels of other serum enzymes: Secondary | ICD-10-CM | POA: Diagnosis not present

## 2016-01-31 DIAGNOSIS — E039 Hypothyroidism, unspecified: Secondary | ICD-10-CM | POA: Diagnosis not present

## 2016-03-18 DIAGNOSIS — E039 Hypothyroidism, unspecified: Secondary | ICD-10-CM | POA: Diagnosis not present

## 2016-04-25 DIAGNOSIS — E039 Hypothyroidism, unspecified: Secondary | ICD-10-CM | POA: Diagnosis not present

## 2016-04-25 DIAGNOSIS — K7469 Other cirrhosis of liver: Secondary | ICD-10-CM | POA: Diagnosis not present

## 2016-04-25 DIAGNOSIS — D649 Anemia, unspecified: Secondary | ICD-10-CM | POA: Diagnosis not present

## 2016-04-25 DIAGNOSIS — Z1389 Encounter for screening for other disorder: Secondary | ICD-10-CM | POA: Diagnosis not present

## 2016-04-25 DIAGNOSIS — D696 Thrombocytopenia, unspecified: Secondary | ICD-10-CM | POA: Diagnosis not present

## 2016-04-25 DIAGNOSIS — R7309 Other abnormal glucose: Secondary | ICD-10-CM | POA: Diagnosis not present

## 2016-04-25 DIAGNOSIS — K760 Fatty (change of) liver, not elsewhere classified: Secondary | ICD-10-CM | POA: Diagnosis not present

## 2016-04-25 DIAGNOSIS — E559 Vitamin D deficiency, unspecified: Secondary | ICD-10-CM | POA: Diagnosis not present

## 2016-04-25 DIAGNOSIS — R5383 Other fatigue: Secondary | ICD-10-CM | POA: Diagnosis not present

## 2016-04-25 DIAGNOSIS — E538 Deficiency of other specified B group vitamins: Secondary | ICD-10-CM | POA: Diagnosis not present

## 2016-04-25 DIAGNOSIS — D691 Qualitative platelet defects: Secondary | ICD-10-CM | POA: Diagnosis not present

## 2016-04-25 DIAGNOSIS — M1990 Unspecified osteoarthritis, unspecified site: Secondary | ICD-10-CM | POA: Diagnosis not present

## 2016-04-26 DIAGNOSIS — D696 Thrombocytopenia, unspecified: Secondary | ICD-10-CM | POA: Diagnosis not present

## 2016-04-26 DIAGNOSIS — Z1389 Encounter for screening for other disorder: Secondary | ICD-10-CM | POA: Diagnosis not present

## 2016-04-26 DIAGNOSIS — K7469 Other cirrhosis of liver: Secondary | ICD-10-CM | POA: Diagnosis not present

## 2016-04-26 DIAGNOSIS — R5383 Other fatigue: Secondary | ICD-10-CM | POA: Diagnosis not present

## 2016-06-06 DIAGNOSIS — R5383 Other fatigue: Secondary | ICD-10-CM | POA: Diagnosis not present

## 2016-06-26 ENCOUNTER — Other Ambulatory Visit (HOSPITAL_COMMUNITY): Payer: Medicare Other

## 2016-06-26 ENCOUNTER — Ambulatory Visit (HOSPITAL_COMMUNITY): Payer: Medicare Other

## 2016-06-29 ENCOUNTER — Other Ambulatory Visit: Payer: Self-pay | Admitting: Gynecology

## 2016-07-30 ENCOUNTER — Encounter: Payer: Medicare Other | Admitting: Gynecology

## 2016-08-14 ENCOUNTER — Encounter: Payer: Self-pay | Admitting: Gynecology

## 2016-08-19 DIAGNOSIS — M79672 Pain in left foot: Secondary | ICD-10-CM | POA: Diagnosis not present

## 2016-08-19 DIAGNOSIS — M25579 Pain in unspecified ankle and joints of unspecified foot: Secondary | ICD-10-CM | POA: Diagnosis not present

## 2016-09-02 DIAGNOSIS — E782 Mixed hyperlipidemia: Secondary | ICD-10-CM | POA: Diagnosis not present

## 2016-09-02 DIAGNOSIS — E669 Obesity, unspecified: Secondary | ICD-10-CM | POA: Diagnosis not present

## 2016-09-02 DIAGNOSIS — Z6835 Body mass index (BMI) 35.0-35.9, adult: Secondary | ICD-10-CM | POA: Diagnosis not present

## 2016-09-02 DIAGNOSIS — E049 Nontoxic goiter, unspecified: Secondary | ICD-10-CM | POA: Diagnosis not present

## 2016-09-02 DIAGNOSIS — E6609 Other obesity due to excess calories: Secondary | ICD-10-CM | POA: Diagnosis not present

## 2016-09-02 DIAGNOSIS — G4700 Insomnia, unspecified: Secondary | ICD-10-CM | POA: Diagnosis not present

## 2016-09-02 DIAGNOSIS — E063 Autoimmune thyroiditis: Secondary | ICD-10-CM | POA: Diagnosis not present

## 2016-09-02 DIAGNOSIS — K219 Gastro-esophageal reflux disease without esophagitis: Secondary | ICD-10-CM | POA: Diagnosis not present

## 2016-09-09 DIAGNOSIS — M79672 Pain in left foot: Secondary | ICD-10-CM | POA: Diagnosis not present

## 2016-09-09 DIAGNOSIS — M25579 Pain in unspecified ankle and joints of unspecified foot: Secondary | ICD-10-CM | POA: Diagnosis not present

## 2016-09-19 ENCOUNTER — Encounter: Payer: Self-pay | Admitting: "Endocrinology

## 2016-09-19 ENCOUNTER — Ambulatory Visit (INDEPENDENT_AMBULATORY_CARE_PROVIDER_SITE_OTHER): Payer: PPO | Admitting: Gynecology

## 2016-09-19 ENCOUNTER — Encounter: Payer: Self-pay | Admitting: Gynecology

## 2016-09-19 VITALS — BP 124/78 | Ht 62.0 in | Wt 230.0 lb

## 2016-09-19 DIAGNOSIS — Z01419 Encounter for gynecological examination (general) (routine) without abnormal findings: Secondary | ICD-10-CM

## 2016-09-19 DIAGNOSIS — M8580 Other specified disorders of bone density and structure, unspecified site: Secondary | ICD-10-CM

## 2016-09-19 NOTE — Progress Notes (Signed)
Melanie Porter 09-Mar-1941 [id number]   History:    76 y.o.  for annual gyn exam with no complaints today..Patient is being followed by her PCP Dr. Hilma Favors who has been doing her blood work. Also the hematologist oncologist has been following her for her thrombocytopenia. Her PCP has been doing her blood work. Patient stated that her last colonoscopy was in 2012 benign polyps of been removed she had a colonoscopy in 2017 which was benign and she is on a 5 year recall. Patient on no hormone replacement therapy. Patient's bone density study done in 2016 demonstrated the lowest T score was -1.6 at the right femoral neck. Total right hip bone mineralization decreased by -8.3% when compared with 2014 the rest of the region of interest with no statistically significant change and had a normal Frax analysis.She did have a traumatic fall in 2012 resulting in a back and right ankle fracture. She is taking her calcium and vitamin D.  I did come across a bone density study that for some reason was done in 2017 that her PCP may have ordered which indicated that the lowest T score was at the right femoral neck with a value of -2.2 with a normal Frax analysis.  In 2016 patient's mammogram and ultrasound had demonstrated a calcified fibroadenoma of the right breast at the 4:00 position 3-4 finger breast from the nipple and recommendation was to follow-up in one year mammogram in 2017 was normal. Patient with no past history of any abnormal Pap smears.  Patient with past history of vaginal hysterectomy benign pathology.    Past medical history,surgical history, family history and social history were all reviewed and documented in the EPIC chart.  Gynecologic History No LMP recorded. Patient has had a hysterectomy. Contraception: status post hysterectomy Last Pap: 2012. Results were: normal Last mammogram: 2017. Results were: normal  Obstetric History OB History  Gravida Para Term Preterm AB Living  2 2 2      2   SAB TAB Ectopic Multiple Live Births          2    # Outcome Date GA Lbr Len/2nd Weight Sex Delivery Anes PTL Lv  2 Term     F Vag-Spont  N LIV  1 Term     M Vag-Spont  N LIV       ROS: A ROS was performed and pertinent positives and negatives are included in the history.  GENERAL: No fevers or chills. HEENT: No change in vision, no earache, sore throat or sinus congestion. NECK: No pain or stiffness. CARDIOVASCULAR: No chest pain or pressure. No palpitations. PULMONARY: No shortness of breath, cough or wheeze. GASTROINTESTINAL: No abdominal pain, nausea, vomiting or diarrhea, melena or bright red blood per rectum. GENITOURINARY: No urinary frequency, urgency, hesitancy or dysuria. MUSCULOSKELETAL: No joint or muscle pain, no back pain, no recent trauma. DERMATOLOGIC: No rash, no itching, no lesions. ENDOCRINE: No polyuria, polydipsia, no heat or cold intolerance. No recent change in weight. HEMATOLOGICAL: No anemia or easy bruising or bleeding. NEUROLOGIC: No headache, seizures, numbness, tingling or weakness. PSYCHIATRIC: No depression, no loss of interest in normal activity or change in sleep pattern.     Exam: chaperone present  BP 124/78   Ht 5\' 2"  (1.575 m)   Wt 230 lb (104.3 kg)   BMI 42.07 kg/m   Body mass index is 42.07 kg/m.  General appearance : Well developed well nourished female. No acute distress HEENT: Eyes: no retinal hemorrhage or  exudates,  Neck supple, trachea midline, no carotid bruits, no thyroidmegaly Lungs: Clear to auscultation, no rhonchi or wheezes, or rib retractions  Heart: Regular rate and rhythm, no murmurs or gallops Breast:Examined in sitting and supine position were symmetrical in appearance, no palpable masses or tenderness,  no skin retraction, no nipple inversion, no nipple discharge, no skin discoloration, no axillary or supraclavicular lymphadenopathy Abdomen: no palpable masses or tenderness, no rebound or guarding Extremities: no edema  or skin discoloration or tenderness  Pelvic:  Bartholin, Urethra, Skene Glands: Within normal limits             Vagina: No gross lesions or discharge  Cervix: Absent  Uterus  absent  Adnexa  Without masses or tenderness  Anus and perineum  normal   Rectovaginal  normal sphincter tone without palpated masses or tenderness             Hemoccult PCP provides     Assessment/Plan:  76 y.o. female for annual exam history of osteopenia and does not need a bone density study in 20 next year. We discussed importance of calcium vitamin D and weightbearing exercises for osteoporosis prevention. Pap smear not indicated. PCP has been doing her blood work. Patient was provided with a requisition to schedule her mammogram next month.   Terrance Mass MD, 10:48 AM 09/19/2016

## 2016-09-24 ENCOUNTER — Encounter: Payer: Self-pay | Admitting: Gastroenterology

## 2016-10-21 DIAGNOSIS — M7989 Other specified soft tissue disorders: Secondary | ICD-10-CM | POA: Diagnosis not present

## 2016-10-21 DIAGNOSIS — M25579 Pain in unspecified ankle and joints of unspecified foot: Secondary | ICD-10-CM | POA: Diagnosis not present

## 2016-10-21 DIAGNOSIS — M79672 Pain in left foot: Secondary | ICD-10-CM | POA: Diagnosis not present

## 2016-10-25 DIAGNOSIS — K219 Gastro-esophageal reflux disease without esophagitis: Secondary | ICD-10-CM | POA: Diagnosis not present

## 2016-10-25 DIAGNOSIS — E063 Autoimmune thyroiditis: Secondary | ICD-10-CM | POA: Diagnosis not present

## 2016-10-25 DIAGNOSIS — E782 Mixed hyperlipidemia: Secondary | ICD-10-CM | POA: Diagnosis not present

## 2016-10-25 DIAGNOSIS — M7989 Other specified soft tissue disorders: Secondary | ICD-10-CM | POA: Diagnosis not present

## 2016-10-28 DIAGNOSIS — M79672 Pain in left foot: Secondary | ICD-10-CM | POA: Diagnosis not present

## 2016-10-28 DIAGNOSIS — S92256A Nondisplaced fracture of navicular [scaphoid] of unspecified foot, initial encounter for closed fracture: Secondary | ICD-10-CM | POA: Diagnosis not present

## 2016-11-07 ENCOUNTER — Other Ambulatory Visit: Payer: Self-pay

## 2016-11-07 NOTE — Telephone Encounter (Signed)
07/28/2015 Dr. Moshe Salisbury wrote at her annual exam "Assessment/Plan:  76 y.o. female for annual exam with apparent overactive bladder Detrol did not help. Patient with no history of glaucoma or hypertension will be place on Myrbetriq 25 mg daily. Risks benefits and pros and cons were discussed literature information was provided"  However, on 07/01/2016 he only prescribed #30 with 2 refills.   Current with [facility] on 09/19/2016.  Refills ok?

## 2016-11-10 MED ORDER — MIRABEGRON ER 25 MG PO TB24: 25.0000 mg | ORAL_TABLET | Freq: Every day | ORAL | 10 refills | Status: DC

## 2016-11-10 NOTE — Telephone Encounter (Signed)
Agree with refills until due for Annual/Gyn exam 08/2017.

## 2016-11-11 NOTE — Telephone Encounter (Signed)
rx sent

## 2016-11-16 IMAGING — US US ABDOMEN LIMITED
1 series · 14 of 25 positions shown · non-contrast
Comparison: Ultrasound 11/01/2013.  CT 03/29/2013.

CLINICAL DATA: Cirrhosis.  Obesity.

EXAM:
US ABDOMEN LIMITED - RIGHT UPPER QUADRANT

[Series 1: us abdomen limited · 0.14mm/px · 14 of 70 slices shown]
[im 1/70]
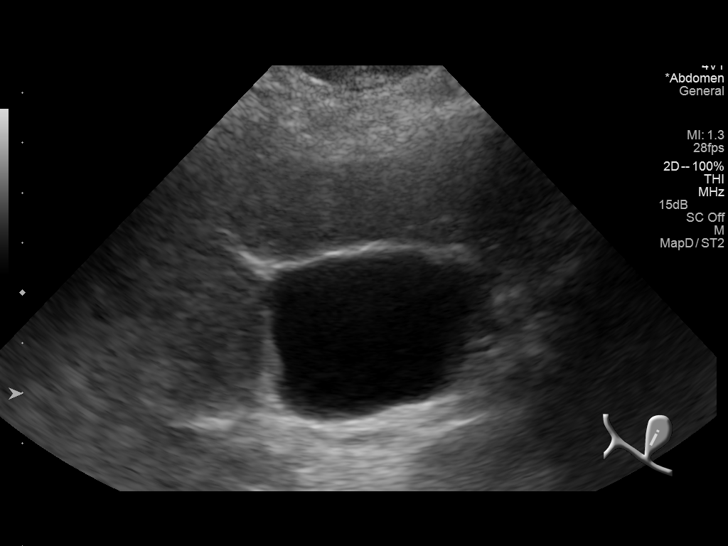
[im 6/70]
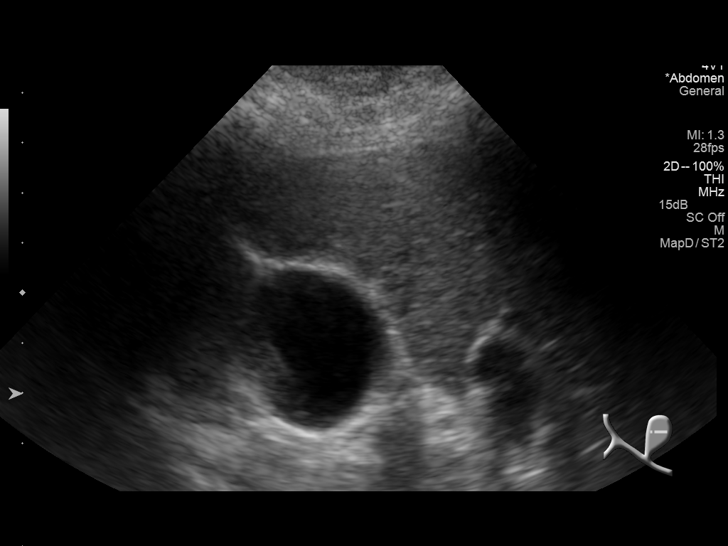
[im 12/70]
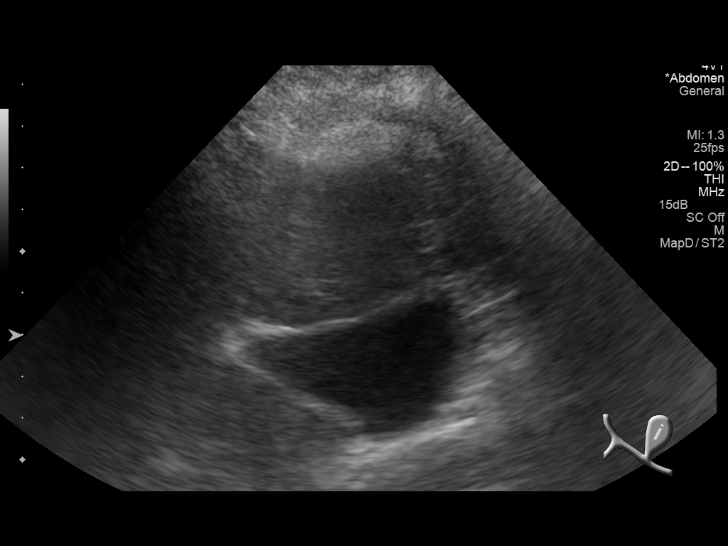
[im 18/70]
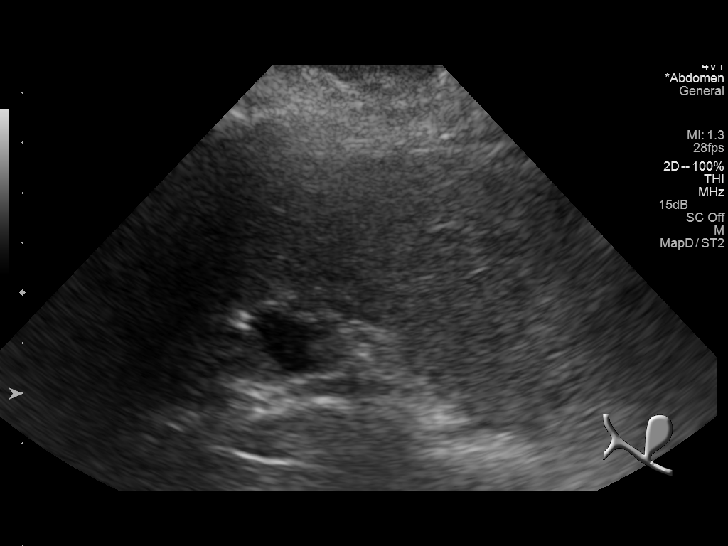
[im 24/70]
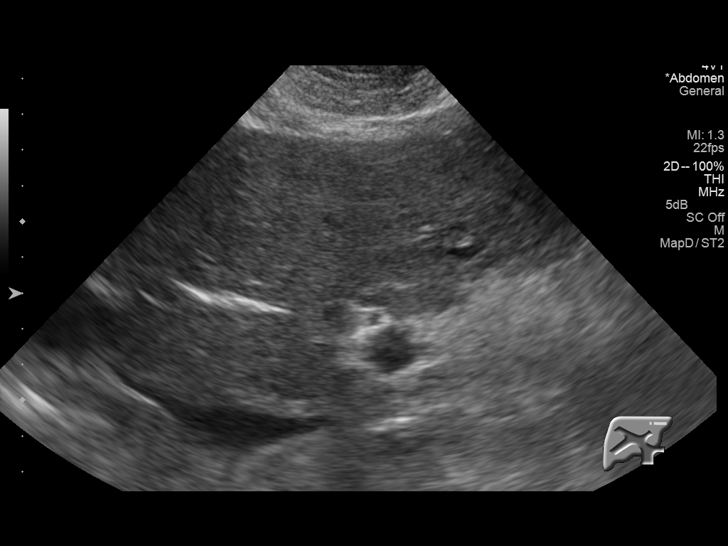
[im 26/70]
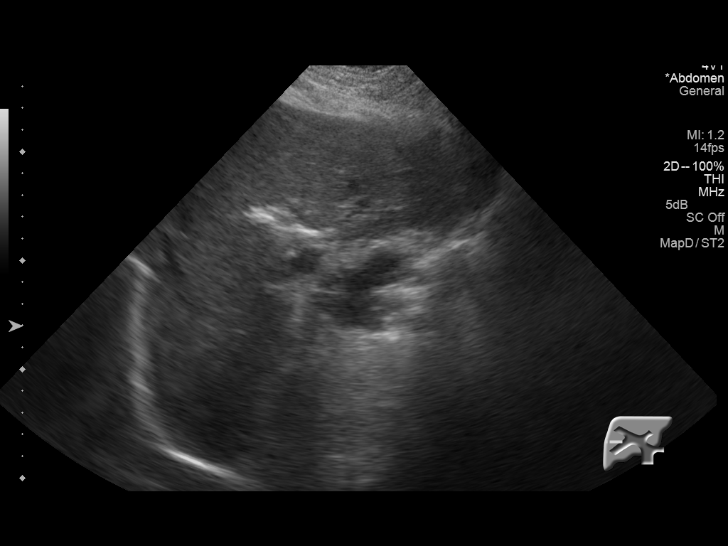
[im 32/70]
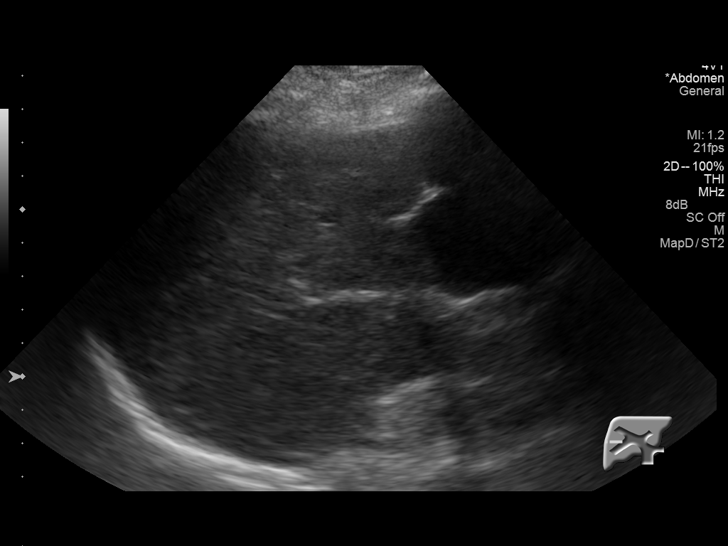
[im 38/70]
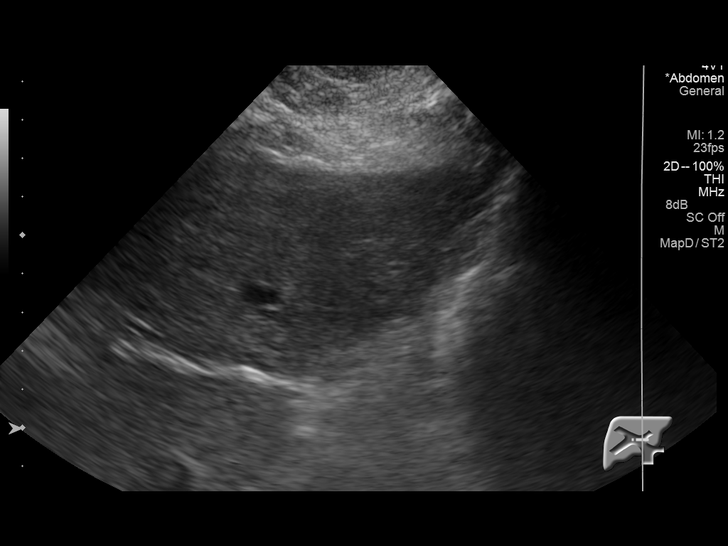
[im 44/70]
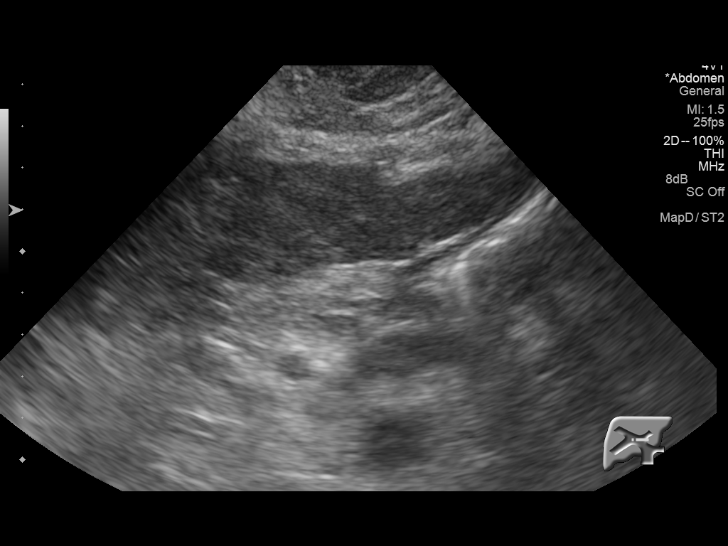
[im 47/70]
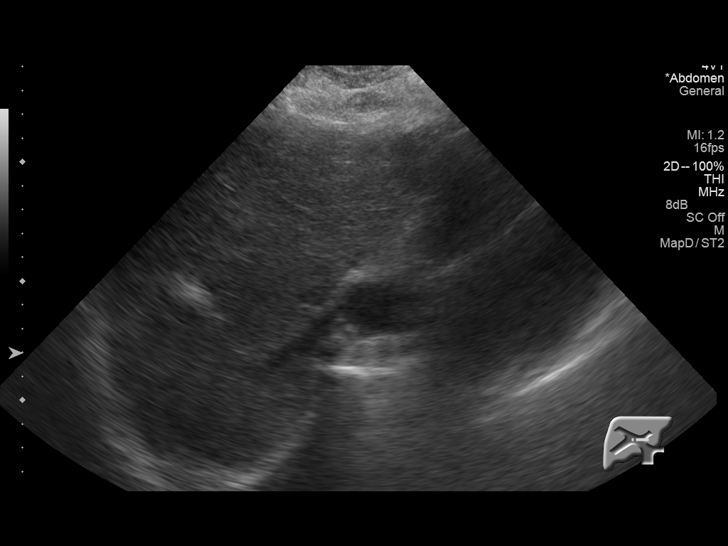
[im 52/70]
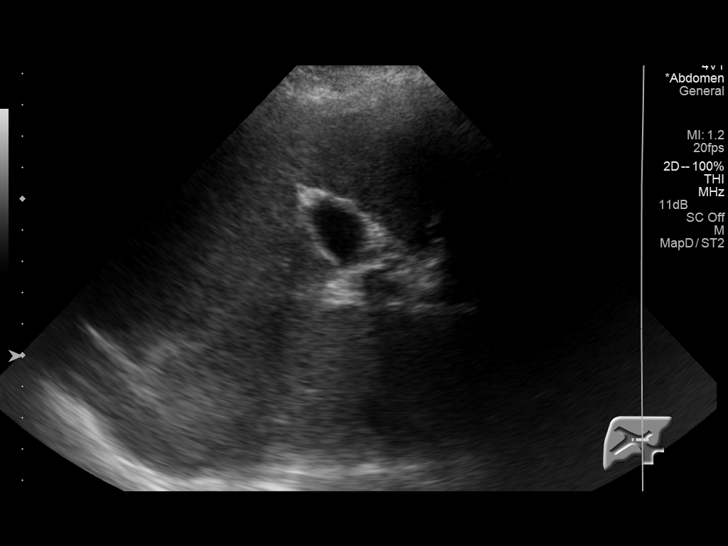
[im 58/70]
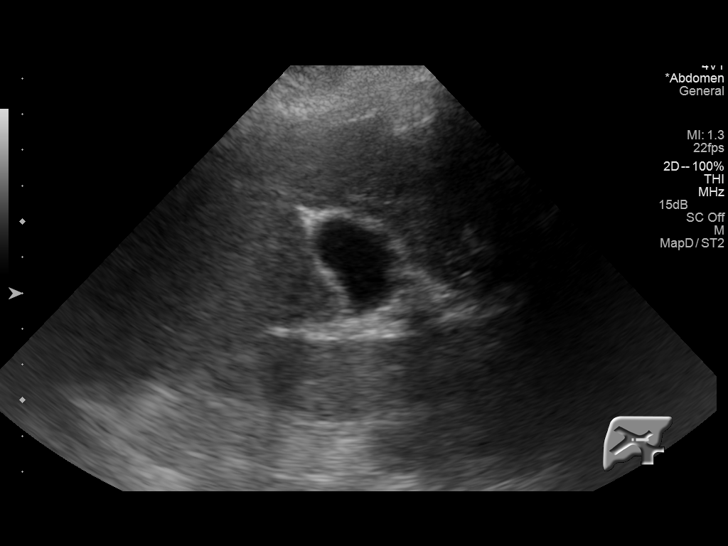
[im 64/70]
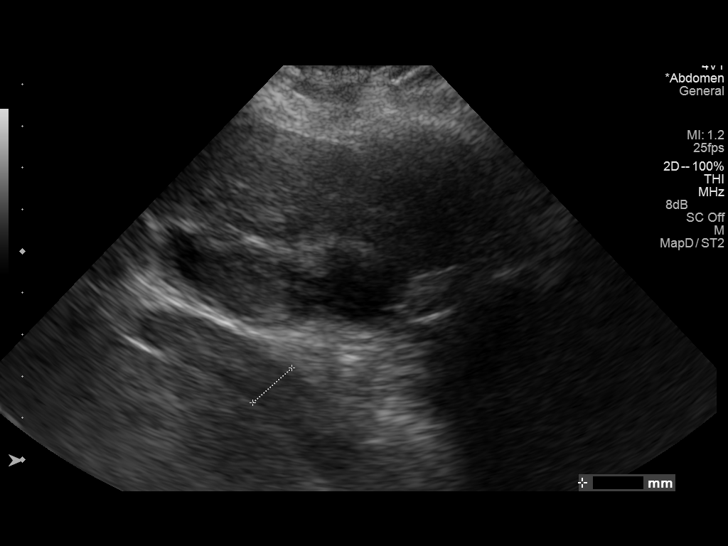
[im 70/70]
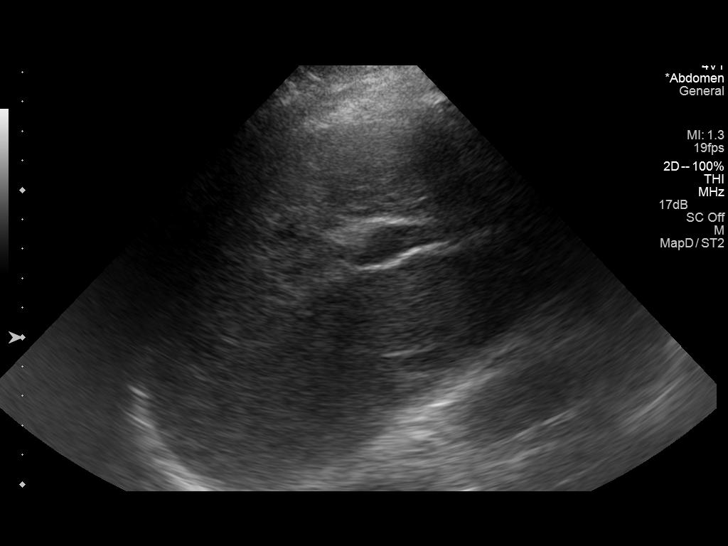

[14 of 25 positions shown; findings below may reference images not displayed]

FINDINGS: Gallbladder:

No gallstones or wall thickening visualized. No sonographic Murphy
sign noted.

Common bile duct:

Diameter: 4.3 mm.

Liver:

Liver has a nodular coarse echotexture consistent with patient's
known cirrhosis. No focal hepatic abnormality.
IMPRESSION: Coarse nodular liver consistent with known cirrhosis. No focal
hepatic abnormality. Exam is otherwise unremarkable. No gallstones
or biliary distention.

## 2016-11-18 DIAGNOSIS — M79672 Pain in left foot: Secondary | ICD-10-CM | POA: Diagnosis not present

## 2016-11-18 DIAGNOSIS — S92256D Nondisplaced fracture of navicular [scaphoid] of unspecified foot, subsequent encounter for fracture with routine healing: Secondary | ICD-10-CM | POA: Diagnosis not present

## 2016-11-19 DIAGNOSIS — Z1231 Encounter for screening mammogram for malignant neoplasm of breast: Secondary | ICD-10-CM | POA: Diagnosis not present

## 2016-12-09 DIAGNOSIS — M79672 Pain in left foot: Secondary | ICD-10-CM | POA: Diagnosis not present

## 2016-12-09 DIAGNOSIS — S92256D Nondisplaced fracture of navicular [scaphoid] of unspecified foot, subsequent encounter for fracture with routine healing: Secondary | ICD-10-CM | POA: Diagnosis not present

## 2016-12-24 DIAGNOSIS — H524 Presbyopia: Secondary | ICD-10-CM | POA: Diagnosis not present

## 2016-12-24 DIAGNOSIS — H52203 Unspecified astigmatism, bilateral: Secondary | ICD-10-CM | POA: Diagnosis not present

## 2016-12-30 ENCOUNTER — Ambulatory Visit (HOSPITAL_COMMUNITY)
Admission: RE | Admit: 2016-12-30 | Discharge: 2016-12-30 | Disposition: A | Discharge status: Home / Self Care | Payer: PPO | Source: Ambulatory Visit | Attending: Family Medicine | Admitting: Family Medicine

## 2016-12-30 ENCOUNTER — Other Ambulatory Visit (HOSPITAL_COMMUNITY): Payer: Self-pay | Admitting: Family Medicine

## 2016-12-30 DIAGNOSIS — K7469 Other cirrhosis of liver: Secondary | ICD-10-CM | POA: Diagnosis not present

## 2016-12-30 DIAGNOSIS — Z6835 Body mass index (BMI) 35.0-35.9, adult: Secondary | ICD-10-CM | POA: Diagnosis not present

## 2016-12-30 DIAGNOSIS — E6609 Other obesity due to excess calories: Secondary | ICD-10-CM | POA: Diagnosis not present

## 2016-12-30 DIAGNOSIS — M7989 Other specified soft tissue disorders: Secondary | ICD-10-CM

## 2016-12-30 DIAGNOSIS — R600 Localized edema: Secondary | ICD-10-CM | POA: Diagnosis not present

## 2016-12-30 DIAGNOSIS — E063 Autoimmune thyroiditis: Secondary | ICD-10-CM | POA: Diagnosis not present

## 2016-12-30 DIAGNOSIS — D696 Thrombocytopenia, unspecified: Secondary | ICD-10-CM | POA: Diagnosis not present

## 2017-02-13 ENCOUNTER — Encounter: Payer: Self-pay | Admitting: *Deleted

## 2017-02-13 ENCOUNTER — Encounter: Payer: Self-pay | Admitting: Gastroenterology

## 2017-02-13 ENCOUNTER — Ambulatory Visit: Payer: PPO | Admitting: Gastroenterology

## 2017-02-13 DIAGNOSIS — K219 Gastro-esophageal reflux disease without esophagitis: Secondary | ICD-10-CM

## 2017-02-13 DIAGNOSIS — G43A0 Cyclical vomiting, not intractable: Secondary | ICD-10-CM

## 2017-02-13 DIAGNOSIS — K7469 Other cirrhosis of liver: Secondary | ICD-10-CM

## 2017-02-13 DIAGNOSIS — R1115 Cyclical vomiting syndrome unrelated to migraine: Secondary | ICD-10-CM

## 2017-02-13 DIAGNOSIS — R634 Abnormal weight loss: Secondary | ICD-10-CM

## 2017-02-13 MED ORDER — LIDOCAINE VISCOUS 2 % MT SOLN: OROMUCOSAL | 1 refills | Status: DC

## 2017-02-13 NOTE — Patient Instructions (Addendum)
ADD VISCOUS LIDOCAINE 2 TSP 30 MINS PRIOR TO MEALS AND AT BEDTIME TO HELP WITH STOMACH AND THROAT PAIN. YOU SHOULD SEPARATE DOSES BY 4 HOURS. NO MORE THAN 8 DOSES A DAY.  CONTINUE PROTONIX. TAKE 30 MINUTES PRIOR TO BREAKFAST.   COMPLETE CT SCAN WITHIN 7 DAYS.  I WILL OBTAIN LABS FROM DR. GOLDING.   FOLLOW UP IN 6 MOS.

## 2017-02-13 NOTE — Assessment & Plan Note (Signed)
SYMPTOMS FAIRLY WELL CONTROLLED.  ADD VISCOUS LIDOCAINE 2 TSP 30 MINS PRIOR TO MEALS AND AT BEDTIME TO HELP WITH STOMACH AND THROAT PAIN. YOU SHOULD SEPARATE DOSES BY 4 HOURS. NO MORE THAN 8 DOSES A DAY. CONTINUE PROTONIX. TAKE 30 MINUTES PRIOR TO BREAKFAST. FOLLOW UP IN 6 MOS.

## 2017-02-13 NOTE — Progress Notes (Signed)
cc'ed to pcp °

## 2017-02-13 NOTE — Assessment & Plan Note (Addendum)
ASSOCIATED WITH 13 LB UNINTENTIONAL WEIGHT LOSS. LAST IMAGING: 2017. DIFFERENTIAL DIAGNOSIS INCLUDES: UNCONTROLLED GERD, GE JUNCTION TUMOR, GASTRIC OR PANCREATIC CA OR CHRONIC MESENTERIC ISCHEMIA   COMPLETE CT SCAN WITHIN 7 DAYS.  OBTAIN LABS FROM DR. GOLDING. FOLLOW UP IN 6 MOS.

## 2017-02-13 NOTE — Progress Notes (Signed)
Subjective:    Patient ID: Melanie Porter, female    DOB: July 23, 1940, 76 y.o.   MRN: [id number]  Melanie Sites, MD   HPI Feet swelling all the time. SAW PCP FOR SORE THROAT IN OCT 2018. Throat sore and can't lay on left side. USED TO WORK AS A NURSE AND FACTORY WORKER. CAN'T GET WHITE HOSE CASE LEGS ARE TOO SWOLLEN. IS COLD ALL THE TIME. BMs; DOING GOOD IF TAKES HER GUMMIES. NAUSEA: YESTERDAY ALL DAY. VOMITING: JUST GAGS AND WATER PURE WATER COME OUT OF THROAT. DOESN'T EAT LIKE SHE USED TO. DRANK GRAPE JUICE AND STOOL LOOKED BLACK/LOOSE(3-4 TIMES). YEAST INFECTION: PRIVATE PARTS  & CREAMS HAVEN'T CLEARED IT UP YET. LOOKS LIKE SHE CAN'T GET ENOUGH BREATH.  FEELS FULL IN UPPER STOMACH. HURTING IN HER UPPER STOMACH. BEEN HAPPENING FOR PAST 3 MOS. HURTS IN THE BACK OF THROAT WHEN SHE SWALLOWS. WEIGHT DOWN 244 LBS TO 231 LBS FROM 2017.   PT DENIES FEVER, CHILLS, HEMATOCHEZIA, HEMATEMESIS, diarrhea, CHEST PAIN, CHANGE IN BOWEL IN HABITS, constipation, problems with sedation, OR  heartburn or indigestion.  Past Medical History:  Diagnosis Date  . Ankle fracture, right 05/21/10  . Anxiety   . Arthritis   . Back fracture 05/21/10  . Depression   . Fibromyalgia   . GERD (gastroesophageal reflux disease)   . Hypercholesteremia   . 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 Knee fracture, right 05/21/10  . Nonalcoholic steatohepatitis ([facility]) BMI > 40   NOV 2013 CHILD PUGH A, MELD SCORE 7. 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 Obesity   . Splenomegaly 09/23/2012  . Thrombocytopenia ([place]) 02/19/2012    Past Surgical History:  Procedure Laterality Date  . ABDOMINAL HYSTERECTOMY    . BREAST BIOPSY  1982   left benign  . CATARACT EXTRACTION W/PHACO Right 07/11/2015   Procedure: CATARACT EXTRACTION PHACO AND INTRAOCULAR LENS PLACEMENT (IOC);  Surgeon: Rutherford Guys, MD;  Location: [facility];  Service: Ophthalmology;  Laterality: Right;   CDE: 6.00  . CATARACT EXTRACTION W/PHACO Left 07/25/2015   Procedure: CATARACT EXTRACTION PHACO AND INTRAOCULAR LENS PLACEMENT (IOC);  Surgeon: Rutherford Guys, MD;  Location: [facility];  Service: Ophthalmology;  Laterality: Left;  CDE:8.58  . COLONOSCOPY  June 2010   Dr. Collene Mares: few scattered sigmoid diverticula, otherwise normal. Screening in 2020   . ESOPHAGOGASTRODUODENOSCOPY  03/02/2012   Dr. Oneida Alar:The mucosa of the esophagus appeared normal Multiple non-bleeding ulcers, ranging between 3-52mm in size were found in the gastric antrum  The duodenal mucosa showed no abnormalities in the bulb and second portion of the duodenum. path negative for H.pylori  . ESOPHAGOGASTRODUODENOSCOPY N/A 07/15/2014   SLF: 1. No esophageal, gastric, or duodenal varices 2. small 1-2 mm gastric ulcer. Negative H.pylori   . ESOPHAGOGASTRODUODENOSCOPY N/A 06/05/2015   SLF: 1. Mild gastritis on ASA and PPI  . PARTIAL HYSTERECTOMY      No Known Allergies  Current Outpatient Medications  Medication Sig Dispense Refill  . aspirin EC 81 MG tablet Take 81 mg by mouth at bedtime.    . bisacodyl (DULCOLAX) 5 MG EC tablet Take 10 mg by mouth daily as needed. Constipation    . Bromfenac Sodium 0.07 % SOLN     . Cholecalciferol (VITAMIN D3) 5000 units CAPS Take 1 capsule daily by mouth.    . docusate sodium 100 MG capsule Take 100 mg qd PRN for mild constipation.     Marland Kitchen  FLUoxetine (PROZAC) 20 MG capsule Take 20 mg by mouth daily.    . furosemide (LASIX) 40 MG tablet Take 1 tablet daily by mouth. For 10 days    . levothyroxine  50 MCG tablet Take 50 mcg by mouth daily.    Marland Kitchen levothyroxine  75 MCG tablet Take 75 mcg by mouth daily before breakfast.    . loratadine (CLARITIN) 10 MG tablet Take 10 mg by mouth as needed for allergies.    . Magnesium 250 MG TABS Take 2 tablets at bedtime by mouth.    . Magnesium Hydroxide  Take 1 tablet by mouth at bedtime.    . meloxicam (MOBIC) 7.5 MG tablet Take 7.5 mg by mouth daily.     .  mirabegron ER 25 MG TB24 tablet Take 1 tablet (25 mg total) by mouth daily.    . Multiple Vitamin TABS tablet Take 1 tablet by mouth daily.    .      . ofloxacin (OCUFLOX) 0.3 % ophthalmic solution PUT 1 DROP TWICE DAILY IN OPERATIVE EYE STARTING 2 DAYS BEFORE SU...  (REFER TO PRESCRIPTION NOTES).    . pantoprazole (PROTONIX) 40 MG tablet Take 1 tablet (40 mg total) by mouth daily.    Marland Kitchen PROLENSA 0.07 % SOLN PUT 1 DROP IN OPERATIVE EYE DAILY STARTING 2 DAYS BEFORE SURGERY ...  (REFER TO PRESCRIPTION NOTES).    .      . psyllium (METAMUCIL) 58.6 % powder Take 1 packet by mouth as needed (constipation).     . simvastatin (ZOCOR) 40 MG tablet Take 40 mg by mouth every evening.    . traZODone (DESYREL) 50 MG tablet Take 50 mg by mouth at bedtime.    . Diclofenac Sodium CR 100 MG 24 hr tablet Take 100 mg by mouth daily.    .      . traMADol (ULTRAM-ER) 100 MG 24 hr tablet      Review of Systems PER HPI OTHERWISE ALL SYSTEMS ARE NEGATIVE.     Objective:   Physical Exam  Constitutional: She is oriented to person, place, and time. She appears well-developed and well-nourished. No distress.  HENT:  Head: Normocephalic and atraumatic.  Mouth/Throat: Oropharynx is clear and moist. No oropharyngeal exudate.  Eyes: Pupils are equal, round, and reactive to light. No scleral icterus.  Neck: Normal range of motion. Neck supple.  Cardiovascular: Normal rate, regular rhythm and normal heart sounds.  Pulmonary/Chest: Effort normal and breath sounds normal. No respiratory distress.  Abdominal: Soft. Bowel sounds are normal. She exhibits no distension. There is tenderness. There is no rebound and no guarding.  MILD TTP IN THE EPIGASTRIUM & BUQs   Musculoskeletal: She exhibits edema (1-2+ bilateral lower extremities).  Lymphadenopathy:    She has no cervical adenopathy.  Neurological: She is alert and oriented to person, place, and time.  NO  NEW FOCAL DEFICITS  Psychiatric: She has a normal mood and  affect.  Vitals reviewed.     Assessment & Plan:

## 2017-02-13 NOTE — Patient Instructions (Signed)
MS submitted PA info for CT abd/pelvis w/contrast via [employer]. Case approved. PA# [id number], 02/13/17-05/14/17.

## 2017-02-13 NOTE — Progress Notes (Signed)
On recall  °

## 2017-02-17 DIAGNOSIS — M79672 Pain in left foot: Secondary | ICD-10-CM | POA: Diagnosis not present

## 2017-02-17 DIAGNOSIS — M25579 Pain in unspecified ankle and joints of unspecified foot: Secondary | ICD-10-CM | POA: Diagnosis not present

## 2017-02-18 ENCOUNTER — Ambulatory Visit (HOSPITAL_COMMUNITY)
Admission: RE | Admit: 2017-02-18 | Discharge: 2017-02-18 | Disposition: A | Discharge status: Home / Self Care | Payer: PPO | Source: Ambulatory Visit | Attending: Gastroenterology | Admitting: Gastroenterology

## 2017-02-18 DIAGNOSIS — K766 Portal hypertension: Secondary | ICD-10-CM | POA: Diagnosis not present

## 2017-02-18 DIAGNOSIS — I8510 Secondary esophageal varices without bleeding: Secondary | ICD-10-CM | POA: Insufficient documentation

## 2017-02-18 DIAGNOSIS — G43A0 Cyclical vomiting, not intractable: Secondary | ICD-10-CM | POA: Insufficient documentation

## 2017-02-18 DIAGNOSIS — R1110 Vomiting, unspecified: Secondary | ICD-10-CM | POA: Diagnosis not present

## 2017-02-18 DIAGNOSIS — R188 Other ascites: Secondary | ICD-10-CM | POA: Diagnosis not present

## 2017-02-18 DIAGNOSIS — K7460 Unspecified cirrhosis of liver: Secondary | ICD-10-CM | POA: Diagnosis not present

## 2017-02-18 DIAGNOSIS — R1115 Cyclical vomiting syndrome unrelated to migraine: Secondary | ICD-10-CM

## 2017-02-18 DIAGNOSIS — Z6841 Body Mass Index (BMI) 40.0 and over, adult: Secondary | ICD-10-CM | POA: Diagnosis not present

## 2017-02-18 DIAGNOSIS — R634 Abnormal weight loss: Secondary | ICD-10-CM

## 2017-02-18 DIAGNOSIS — R590 Localized enlarged lymph nodes: Secondary | ICD-10-CM | POA: Diagnosis not present

## 2017-02-18 DIAGNOSIS — R112 Nausea with vomiting, unspecified: Secondary | ICD-10-CM | POA: Diagnosis not present

## 2017-02-18 LAB — POCT I-STAT CREATININE: Creatinine, Ser: 0.7 mg/dL (ref 0.44–1.00)

## 2017-02-18 MED ORDER — IOPAMIDOL (ISOVUE-300) INJECTION 61%
100.0000 mL | Freq: Once | INTRAVENOUS | Status: AC | PRN
  Administered 2017-02-18: 100 mL via INTRAVENOUS

## 2017-02-19 ENCOUNTER — Telehealth: Payer: Self-pay | Admitting: Gastroenterology

## 2017-02-19 DIAGNOSIS — K7469 Other cirrhosis of liver: Secondary | ICD-10-CM

## 2017-02-19 DIAGNOSIS — D539 Nutritional anemia, unspecified: Secondary | ICD-10-CM

## 2017-02-19 MED ORDER — SPIRONOLACTONE 50 MG PO TABS: 50.0000 mg | ORAL_TABLET | Freq: Every day | ORAL | 11 refills | Status: DC

## 2017-02-19 NOTE — Telephone Encounter (Signed)
PT is aware.

## 2017-02-19 NOTE — Telephone Encounter (Addendum)
PLEASE CALL PT. HER CT SHOWS CIRRHOSIS AND FLUID IN SKIN AND ABDOMEN. SHE NEEDS TO WATCH THE SALT IN HER DIET. SHE SHOULD COMPLETE LABS WITHIN THE NEXT 7 DAYS TO CHECK KIDNEY AND LIVER FUNCTION.  SHE SHOULD TAKE ALDACTONE 50 MG WITH 20 MG LASIX DAILY.

## 2017-02-19 NOTE — Addendum Note (Signed)
Addended by: Danie Binder on: 02/19/2017 04:05 PM   Modules accepted: Orders

## 2017-02-26 DIAGNOSIS — K7469 Other cirrhosis of liver: Secondary | ICD-10-CM | POA: Diagnosis not present

## 2017-02-27 LAB — COMPREHENSIVE METABOLIC PANEL
AG RATIO: 0.8 (calc) — AB (ref 1.0–2.5)
ALBUMIN MSPROF: 2.8 g/dL — AB (ref 3.6–5.1)
ALT: 19 U/L (ref 6–29)
AST: 43 U/L — AB (ref 10–35)
Alkaline phosphatase (APISO): 172 U/L — ABNORMAL HIGH (ref 33–130)
BILIRUBIN TOTAL: 1.3 mg/dL — AB (ref 0.2–1.2)
BUN: 7 mg/dL (ref 7–25)
CALCIUM: 9.5 mg/dL (ref 8.6–10.4)
CHLORIDE: 105 mmol/L (ref 98–110)
CO2: 29 mmol/L (ref 20–32)
Creat: 0.79 mg/dL (ref 0.60–0.93)
GLOBULIN: 3.6 g/dL (ref 1.9–3.7)
Glucose, Bld: 105 mg/dL (ref 65–139)
POTASSIUM: 3.8 mmol/L (ref 3.5–5.3)
Sodium: 143 mmol/L (ref 135–146)
Total Protein: 6.4 g/dL (ref 6.1–8.1)

## 2017-02-27 LAB — CBC WITH DIFFERENTIAL/PLATELET
BASOS PCT: 0.6 %
Basophils Absolute: 30 cells/uL (ref 0–200)
EOS ABS: 110 {cells}/uL (ref 15–500)
Eosinophils Relative: 2.2 %
HEMATOCRIT: 34.5 % — AB (ref 35.0–45.0)
HEMOGLOBIN: 11.5 g/dL — AB (ref 11.7–15.5)
LYMPHS ABS: 1370 {cells}/uL (ref 850–3900)
MCH: 34.2 pg — AB (ref 27.0–33.0)
MCHC: 33.3 g/dL (ref 32.0–36.0)
MCV: 102.7 fL — ABNORMAL HIGH (ref 80.0–100.0)
Monocytes Relative: 10.5 %
Neutro Abs: 2965 cells/uL (ref 1500–7800)
Neutrophils Relative %: 59.3 %
RBC: 3.36 10*6/uL — ABNORMAL LOW (ref 3.80–5.10)
RDW: 12.1 % (ref 11.0–15.0)
TOTAL LYMPHOCYTE: 27.4 %
WBC: 5 10*3/uL (ref 3.8–10.8)
WBCMIX: 525 {cells}/uL (ref 200–950)

## 2017-02-27 LAB — PROTIME-INR
INR: 1.2 — ABNORMAL HIGH
PROTHROMBIN TIME: 12.5 s — AB (ref 9.0–11.5)

## 2017-02-27 LAB — TSH: TSH: 5.18 m[IU]/L — AB (ref 0.40–4.50)

## 2017-03-03 ENCOUNTER — Encounter: Payer: Self-pay | Admitting: Gastroenterology

## 2017-03-03 NOTE — Addendum Note (Signed)
Addended by: Danie Binder on: 03/03/2017 06:58 PM   Modules accepted: Orders

## 2017-03-03 NOTE — Telephone Encounter (Addendum)
PLEASE CALL PT. 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 HER THYROID TEST SHOWS HER THYROID MAY BE SLIGHTLY UNDERACTIVE. SHE SHOULD HAVE FREE T4 AD T3, VITAMIN B12 AND RBC FOLATE WITHIN THE NEXT WEEK.

## 2017-03-04 DIAGNOSIS — D539 Nutritional anemia, unspecified: Secondary | ICD-10-CM | POA: Diagnosis not present

## 2017-03-04 NOTE — Telephone Encounter (Signed)
PT is aware and will go to the lab this week.

## 2017-03-05 ENCOUNTER — Telehealth: Payer: Self-pay | Admitting: Gastroenterology

## 2017-03-05 LAB — FOLATE RBC: RBC Folate: 729 ng/mL RBC (ref >280)

## 2017-03-05 LAB — T3, FREE: T3 FREE: 2.5 pg/mL (ref 2.3–4.2)

## 2017-03-05 LAB — VITAMIN B12: Vitamin B-12: 611 pg/mL (ref 200–1100)

## 2017-03-05 LAB — T4, FREE: Free T4: 0.8 ng/dL (ref 0.8–1.8)

## 2017-03-05 NOTE — Telephone Encounter (Addendum)
PLEASE CALL PT. HER VITAMIN B12 AND FOLATE LEVELS ARE NORMAL. HER THYROID NUMBERS ARE NEAR NORMAL. SHE SHOULD TAKE HER SYNTHROID TABLETS DAILY AND NOT MISS ANY DOSES AND HAVE REPEAT LEVELS(THS, FREE T3/T4) IN 6 MOS.

## 2017-03-06 ENCOUNTER — Other Ambulatory Visit: Payer: Self-pay

## 2017-03-06 DIAGNOSIS — R7989 Other specified abnormal findings of blood chemistry: Secondary | ICD-10-CM

## 2017-03-06 NOTE — Telephone Encounter (Signed)
Pt called and was informed.  

## 2017-03-06 NOTE — Telephone Encounter (Signed)
REMINDER IN EPIC °

## 2017-03-06 NOTE — Telephone Encounter (Signed)
LMOM to call.

## 2017-03-06 NOTE — Telephone Encounter (Signed)
LMOM to call. Mailing a letter to call also.

## 2017-03-06 NOTE — Telephone Encounter (Signed)
Lab orders on file. 

## 2017-03-18 DIAGNOSIS — D696 Thrombocytopenia, unspecified: Secondary | ICD-10-CM | POA: Diagnosis not present

## 2017-03-18 DIAGNOSIS — K760 Fatty (change of) liver, not elsewhere classified: Secondary | ICD-10-CM | POA: Diagnosis not present

## 2017-03-18 DIAGNOSIS — M1990 Unspecified osteoarthritis, unspecified site: Secondary | ICD-10-CM | POA: Diagnosis not present

## 2017-03-18 DIAGNOSIS — Z6841 Body Mass Index (BMI) 40.0 and over, adult: Secondary | ICD-10-CM | POA: Diagnosis not present

## 2017-03-18 DIAGNOSIS — Z0001 Encounter for general adult medical examination with abnormal findings: Secondary | ICD-10-CM | POA: Diagnosis not present

## 2017-03-18 DIAGNOSIS — E782 Mixed hyperlipidemia: Secondary | ICD-10-CM | POA: Diagnosis not present

## 2017-03-18 DIAGNOSIS — K7469 Other cirrhosis of liver: Secondary | ICD-10-CM | POA: Diagnosis not present

## 2017-04-02 ENCOUNTER — Telehealth: Payer: Self-pay

## 2017-04-02 NOTE — Telephone Encounter (Signed)
Pt called and said that since she started the Spironolactone, she is having some numbness in her fingers on both hands, and her feet gets numb also.   She has had a terrible itching in her rectum and she is washing and cleaning area about 5-6 times a day.  Please advise!

## 2017-04-04 NOTE — Telephone Encounter (Signed)
PLEASE CALL PT. SHE CAN STOP MEDS AND SEE IF THE NUMBNESS  AND RECTAL ITCHING GOES AWAY. SHE SHOULD NOT KEEP WIPING HER ANUS WHICH WILL DRY THE SKIN OUT AND MAKE IT [place]. SHE SHOULD APPLY DIAPER RASH OINTMENT TWICE DAILY. DO NOT USE BABY WIPES EITHER TO WIP HER RECTUM.

## 2017-04-07 NOTE — Telephone Encounter (Signed)
PT is aware.

## 2017-05-07 DIAGNOSIS — K7469 Other cirrhosis of liver: Secondary | ICD-10-CM | POA: Diagnosis not present

## 2017-05-07 DIAGNOSIS — E782 Mixed hyperlipidemia: Secondary | ICD-10-CM | POA: Diagnosis not present

## 2017-05-07 DIAGNOSIS — D696 Thrombocytopenia, unspecified: Secondary | ICD-10-CM | POA: Diagnosis not present

## 2017-05-07 DIAGNOSIS — M1990 Unspecified osteoarthritis, unspecified site: Secondary | ICD-10-CM | POA: Diagnosis not present

## 2017-05-07 DIAGNOSIS — Z1389 Encounter for screening for other disorder: Secondary | ICD-10-CM | POA: Diagnosis not present

## 2017-05-07 DIAGNOSIS — E063 Autoimmune thyroiditis: Secondary | ICD-10-CM | POA: Diagnosis not present

## 2017-05-07 DIAGNOSIS — Z6841 Body Mass Index (BMI) 40.0 and over, adult: Secondary | ICD-10-CM | POA: Diagnosis not present

## 2017-05-07 DIAGNOSIS — R7309 Other abnormal glucose: Secondary | ICD-10-CM | POA: Diagnosis not present

## 2017-06-09 ENCOUNTER — Other Ambulatory Visit: Payer: Self-pay

## 2017-06-09 ENCOUNTER — Emergency Department (HOSPITAL_COMMUNITY): Payer: PPO

## 2017-06-09 ENCOUNTER — Emergency Department (HOSPITAL_COMMUNITY)
Admission: EM | Admit: 2017-06-09 | Discharge: 2017-06-09 | Disposition: A | Discharge status: Home / Self Care | Payer: PPO | Attending: Emergency Medicine | Admitting: Emergency Medicine

## 2017-06-09 ENCOUNTER — Encounter (HOSPITAL_COMMUNITY): Payer: Self-pay | Admitting: Emergency Medicine

## 2017-06-09 DIAGNOSIS — R0602 Shortness of breath: Secondary | ICD-10-CM | POA: Diagnosis not present

## 2017-06-09 DIAGNOSIS — F329 Major depressive disorder, single episode, unspecified: Secondary | ICD-10-CM | POA: Diagnosis not present

## 2017-06-09 DIAGNOSIS — E876 Hypokalemia: Secondary | ICD-10-CM

## 2017-06-09 DIAGNOSIS — Z7982 Long term (current) use of aspirin: Secondary | ICD-10-CM | POA: Diagnosis not present

## 2017-06-09 DIAGNOSIS — R609 Edema, unspecified: Secondary | ICD-10-CM

## 2017-06-09 DIAGNOSIS — R079 Chest pain, unspecified: Secondary | ICD-10-CM | POA: Diagnosis not present

## 2017-06-09 DIAGNOSIS — Z79899 Other long term (current) drug therapy: Secondary | ICD-10-CM | POA: Insufficient documentation

## 2017-06-09 DIAGNOSIS — R600 Localized edema: Secondary | ICD-10-CM | POA: Insufficient documentation

## 2017-06-09 DIAGNOSIS — R2243 Localized swelling, mass and lump, lower limb, bilateral: Secondary | ICD-10-CM | POA: Diagnosis present

## 2017-06-09 DIAGNOSIS — F419 Anxiety disorder, unspecified: Secondary | ICD-10-CM | POA: Diagnosis not present

## 2017-06-09 LAB — COMPREHENSIVE METABOLIC PANEL
ALBUMIN: 2.5 g/dL — AB (ref 3.5–5.0)
ALK PHOS: 138 U/L — AB (ref 38–126)
ALT: 23 U/L (ref 14–54)
ANION GAP: 8 (ref 5–15)
AST: 49 U/L — ABNORMAL HIGH (ref 15–41)
BILIRUBIN TOTAL: 1.7 mg/dL — AB (ref 0.3–1.2)
BUN: 10 mg/dL (ref 6–20)
CALCIUM: 9.6 mg/dL (ref 8.9–10.3)
CO2: 26 mmol/L (ref 22–32)
Chloride: 104 mmol/L (ref 101–111)
Creatinine, Ser: 0.64 mg/dL (ref 0.44–1.00)
GFR calc Af Amer: >60 mL/min (ref >60)
GLUCOSE: 113 mg/dL — AB (ref 65–99)
Potassium: 3.1 mmol/L — ABNORMAL LOW (ref 3.5–5.1)
Sodium: 138 mmol/L (ref 135–145)
TOTAL PROTEIN: 6.2 g/dL — AB (ref 6.5–8.1)

## 2017-06-09 LAB — CBC WITH DIFFERENTIAL/PLATELET
Basophils Absolute: 0 10*3/uL (ref 0.0–0.1)
Basophils Relative: 0 %
EOS ABS: 0.1 10*3/uL (ref 0.0–0.7)
EOS PCT: 2 %
HCT: 35 % — ABNORMAL LOW (ref 36.0–46.0)
Hemoglobin: 11.4 g/dL — ABNORMAL LOW (ref 12.0–15.0)
LYMPHS ABS: 1.3 10*3/uL (ref 0.7–4.0)
Lymphocytes Relative: 26 %
MCH: 34.4 pg — AB (ref 26.0–34.0)
MCHC: 32.6 g/dL (ref 30.0–36.0)
MCV: 105.7 fL — ABNORMAL HIGH (ref 78.0–100.0)
Monocytes Absolute: 0.6 10*3/uL (ref 0.1–1.0)
Monocytes Relative: 12 %
Neutro Abs: 2.9 10*3/uL (ref 1.7–7.7)
Neutrophils Relative %: 60 %
PLATELETS: 84 10*3/uL — AB (ref 150–400)
RBC: 3.31 MIL/uL — AB (ref 3.87–5.11)
RDW: 13.6 % (ref 11.5–15.5)
WBC: 5 10*3/uL (ref 4.0–10.5)

## 2017-06-09 LAB — BRAIN NATRIURETIC PEPTIDE: B Natriuretic Peptide: 38 pg/mL (ref 0.0–100.0)

## 2017-06-09 LAB — TROPONIN I

## 2017-06-09 MED ORDER — POTASSIUM CHLORIDE CRYS ER 20 MEQ PO TBCR
40.0000 meq | EXTENDED_RELEASE_TABLET | Freq: Once | ORAL | Status: AC
  Administered 2017-06-09: 40 meq via ORAL
  Filled 2017-06-09: qty 2

## 2017-06-09 MED ORDER — FUROSEMIDE 80 MG PO TABS: 80.0000 mg | ORAL_TABLET | Freq: Every day | ORAL | 0 refills | Status: DC

## 2017-06-09 MED ORDER — FUROSEMIDE 10 MG/ML IJ SOLN
40.0000 mg | Freq: Once | INTRAMUSCULAR | Status: AC
  Administered 2017-06-09: 40 mg via INTRAMUSCULAR
  Filled 2017-06-09: qty 4

## 2017-06-09 NOTE — ED Provider Notes (Signed)
[facility] EMERGENCY DEPARTMENT Provider Note   CSN: [id number] Arrival date & time: 06/09/17  1359     History   Chief Complaint Chief Complaint  Patient presents with  . Shortness of Breath    HPI Melanie Porter is a 77 y.o. female.  She is complaining of increased peripheral edema of her legs more than her arms for a few months but acutely worse over the last few days.  Today she was even having trouble getting in and out of the car.  It is associated with some shortness of breath and cough with some clear phlegm.  It is also associated with some chest tightness.  She states she saw her doctor today and he told her to go to the emergency department.  She is on a diuretic but she did not take it for a couple of days because it was not doing any good.  The history is provided by the patient.  Shortness of Breath  This is a Melanie problem. The problem has not changed since onset.Associated symptoms include rhinorrhea, cough, sputum production, chest pain, rash, leg pain and leg swelling. Pertinent negatives include no fever, no sore throat, no ear pain, no hemoptysis, no wheezing, no syncope, no vomiting and no abdominal pain. It is unknown what precipitated the problem. She has tried nothing for the symptoms.    Past Medical History:  Diagnosis Date  . Ankle fracture, right 05/21/10  . Anxiety   . Arthritis   . Back fracture 05/21/10  . Depression   . Fibromyalgia   . GERD (gastroesophageal reflux disease)   . Hypercholesteremia   . 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 Knee fracture, right 05/21/10  . Nonalcoholic steatohepatitis (NASH) BMI > 40   NOV 2013 CHILD PUGH A, MELD SCORE 7. NOV 2017 CHILD PUGH B, MELD 9  . Obesity   . Splenomegaly 09/23/2012  . Thrombocytopenia ([place]) 02/19/2012    Patient Active Problem List   Diagnosis Date Noted  . Spinal stenosis of lumbar region 01/25/2014  . PUD (peptic ulcer disease) 11/18/2013  . Undiagnosed  cardiac murmurs 11/18/2013  . Osteopenia 07/20/2013  . Vaginal atrophy 07/20/2013  . Platelet disorder(CLUMPING) 12/28/2012  . Dermatitis perineal 12/28/2012  . Splenomegaly 09/23/2012  . 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 Thrombocytopenia ([place]) 02/19/2012  . Fibromyalgia 02/04/2012  . Hyperlipidemia 02/04/2012  . Arthritis 02/04/2012  . GERD (gastroesophageal reflux disease) 02/04/2012    Past Surgical History:  Procedure Laterality Date  . ABDOMINAL HYSTERECTOMY    . BREAST BIOPSY  1982   left benign  . CATARACT EXTRACTION W/PHACO Right 07/11/2015   Procedure: CATARACT EXTRACTION PHACO AND INTRAOCULAR LENS PLACEMENT (IOC);  Surgeon: Rutherford Guys, MD;  Location: [facility];  Service: Ophthalmology;  Laterality: Right;  CDE: 6.00  . CATARACT EXTRACTION W/PHACO Left 07/25/2015   Procedure: CATARACT EXTRACTION PHACO AND INTRAOCULAR LENS PLACEMENT (IOC);  Surgeon: Rutherford Guys, MD;  Location: [facility];  Service: Ophthalmology;  Laterality: Left;  CDE:8.58  . COLONOSCOPY  June 2010   Dr. Collene Mares: few scattered sigmoid diverticula, otherwise normal. Screening in 2020   . ESOPHAGOGASTRODUODENOSCOPY  03/02/2012   Dr. Oneida Alar:The mucosa of the esophagus appeared normal Multiple non-bleeding ulcers, ranging between 3-79mm in size were found in the gastric antrum  The duodenal mucosa showed no abnormalities in the bulb and second portion of the duodenum. path negative for H.pylori  . ESOPHAGOGASTRODUODENOSCOPY  N/A 07/15/2014   SLF: 1. No esophageal, gastric, or duodenal varices 2. small 1-2 mm gastric ulcer. Negative H.pylori   . ESOPHAGOGASTRODUODENOSCOPY N/A 06/05/2015   SLF: 1. Mild gastritis on ASA and PPI  . PARTIAL HYSTERECTOMY      OB History    Gravida Para Term Preterm AB Living   2 2 2     2    SAB TAB Ectopic Multiple Live Births           2       Home Medications    Prior to Admission  medications   Medication Sig Start Date End Date Taking? Authorizing Provider  aspirin EC 81 MG tablet Take 81 mg by mouth at bedtime.    [provider]  bisacodyl (DULCOLAX) 5 MG EC tablet Take 10 mg by mouth daily as needed. Constipation    [provider]  Bromfenac Sodium 0.07 % SOLN  06/13/15   [provider]  Cholecalciferol (VITAMIN D3) 5000 units CAPS Take 1 capsule daily by mouth.    [provider]  Diclofenac Sodium CR 100 MG 24 hr tablet Take 100 mg by mouth daily. 11/25/14   [provider]  docusate sodium (COLACE) 100 MG capsule Take 100 mg by mouth daily as needed for mild constipation.     [provider]  FLUoxetine (PROZAC) 20 MG capsule Take 20 mg by mouth daily. 12/25/15   [provider]  furosemide (LASIX) 20 MG tablet Take 1 tablet (20 mg total) by mouth daily. Take prn as needed for swelling Patient not taking: Reported on 02/13/2017 12/28/13   Arnoldo Lenis, MD  furosemide (LASIX) 40 MG tablet Take 1 tablet daily by mouth. For 10 days 12/30/16   [provider]  levothyroxine (SYNTHROID, LEVOTHROID) 50 MCG tablet Take 50 mcg by mouth daily. 12/26/15   [provider]  levothyroxine (SYNTHROID, LEVOTHROID) 75 MCG tablet Take 75 mcg by mouth daily before breakfast.    [provider]  lidocaine (XYLOCAINE) 2 % solution 2 TSP  PO qac/hs PRN FOR upper ABDOMINAL OR CHEST PAIN. MAY REPEAT DOSE EVERY 4 HOURS. NO MORE THAN 8 DOSES A DAY. 02/13/17   Fields, Marga Melnick, MD  loratadine (CLARITIN) 10 MG tablet Take 10 mg by mouth as needed for allergies.    [provider]  Magnesium 250 MG TABS Take 2 tablets at bedtime by mouth.    [provider]  Magnesium Hydroxide (MILK OF MAGNESIA PO) Take 1 tablet by mouth at bedtime.    [provider]  meloxicam (MOBIC) 7.5 MG tablet Take 7.5 mg by mouth daily.  06/19/15   [provider]  mirabegron ER (MYRBETRIQ) 25  MG TB24 tablet Take 1 tablet (25 mg total) by mouth daily. 11/10/16   Princess Bruins, MD  Multiple Vitamin (MULTIVITAMIN WITH MINERALS) TABS tablet Take 1 tablet by mouth daily.    [provider]  ofloxacin (OCUFLOX) 0.3 % ophthalmic solution  06/13/15   [provider]  ofloxacin (OCUFLOX) 0.3 % ophthalmic solution PUT 1 DROP TWICE DAILY IN OPERATIVE EYE STARTING 2 DAYS BEFORE SU...  (REFER TO PRESCRIPTION NOTES). 06/13/15   [provider]  pantoprazole (PROTONIX) 40 MG tablet Take 1 tablet (40 mg total) by mouth daily. 03/23/15   Annitta Needs, NP  PROLENSA 0.07 % SOLN PUT 1 DROP IN OPERATIVE EYE DAILY STARTING 2 DAYS BEFORE SURGERY ...  (REFER TO PRESCRIPTION NOTES). 06/19/15   [provider]  Propylene Glycol (SYSTANE BALANCE) 0.6 % SOLN Apply 1 drop to eye daily as needed. Dry Eyes    [provider]  psyllium (METAMUCIL) 58.6 % powder Take 1 packet by mouth as needed (constipation).     [provider]  simvastatin (ZOCOR) 40 MG tablet Take 40 mg by mouth every evening.    [provider]  spironolactone (ALDACTONE) 50 MG tablet Take 1 tablet (50 mg total) by mouth daily. WITH LASIX 02/19/17   Fields, Marga Melnick, MD  traMADol (ULTRAM-ER) 100 MG 24 hr tablet  12/22/15   [provider]  traZODone (DESYREL) 50 MG tablet Take 50 mg by mouth at bedtime. 11/20/15   [provider]    Family History Family History  Problem Relation Age of Onset  . Heart disease Father   . Heart attack Father   . Aneurysm Mother   . Diabetes Sister   . Hypertension Sister   . Heart disease Sister   . Hypertension Brother   . Heart disease Brother   . Colon cancer Neg Hx     Social History Social History   Tobacco Use  . Smoking status: Never Smoker  . Smokeless tobacco: Never Used  . Tobacco comment: Never smoked  Substance Use Topics  . Alcohol use: No    Alcohol/week: 0.0 oz  . Drug use: No     Allergies     Patient has no known allergies.   Review of Systems Review of Systems  Constitutional: Negative for chills and fever.  HENT: Positive for rhinorrhea. Negative for ear pain and sore throat.   Eyes: Negative for pain and visual disturbance.  Respiratory: Positive for cough, sputum production and shortness of breath. Negative for hemoptysis and wheezing.   Cardiovascular: Positive for chest pain and leg swelling. Negative for palpitations and syncope.  Gastrointestinal: Positive for constipation. Negative for abdominal pain and vomiting.  Genitourinary: Positive for dysuria. Negative for hematuria.  Musculoskeletal: Negative for arthralgias and back pain.  Skin: Positive for rash. Negative for color change.  Neurological: Positive for weakness. Negative for seizures and syncope.  All other systems reviewed and are negative.    Physical Exam Updated Vital Signs BP (!) 132/51 (BP Location: Right Arm)   Pulse 90   Temp 98.8 F (37.1 C) (Oral)   Resp (!) 22   Ht 5\' 2"  (1.575 m)   Wt 104.8 kg (231 lb)   SpO2 95%   BMI 42.25 kg/m   Physical Exam  Constitutional: She appears well-developed and well-nourished. No distress.  HENT:  Head: Normocephalic and atraumatic.  Eyes: Conjunctivae are normal.  Neck: Neck supple.  Cardiovascular: Normal rate and regular rhythm.  Murmur (systolic) heard. Pulmonary/Chest: Effort normal and breath sounds normal. No respiratory distress.  Abdominal: Soft. There is no tenderness.  Musculoskeletal:       Right lower leg: She exhibits tenderness and edema.       Left lower leg: She exhibits tenderness and edema.  Neurological: She is alert.  Skin: Skin is warm and dry.  Psychiatric: She has a normal mood and affect.  Nursing note and vitals reviewed.    ED Treatments / Results  Labs (all labs ordered are listed, but only abnormal results are displayed) Labs Reviewed  COMPREHENSIVE METABOLIC PANEL - Abnormal; Notable for the following  components:      Result Value   Potassium 3.1 (*)    Glucose, Bld 113 (*)    Total Protein 6.2 (*)  Albumin 2.5 (*)    AST 49 (*)    Alkaline Phosphatase 138 (*)    Total Bilirubin 1.7 (*)    All other components within normal limits  CBC WITH DIFFERENTIAL/PLATELET - Abnormal; Notable for the following components:   RBC 3.31 (*)    Hemoglobin 11.4 (*)    HCT 35.0 (*)    MCV 105.7 (*)    MCH 34.4 (*)    Platelets 84 (*)    All other components within normal limits  TROPONIN I  BRAIN NATRIURETIC PEPTIDE    EKG  EKG Interpretation  Date/Time:  Monday June 09 2017 14:30:16 EDT Ventricular Rate:  92 PR Interval:  146 QRS Duration: 102 QT Interval:  346 QTC Calculation: 427 R Axis:   -35 Text Interpretation:  Normal sinus rhythm Left axis deviation Left ventricular hypertrophy with repolarization abnormality Abnormal ECG similar to prior 3/12 Confirmed by Aletta Edouard [phone]) on 06/09/2017 6:36:02 PM       Radiology Dg Chest 2 View  Result Date: 06/09/2017 CLINICAL DATA:  77 year old female with shortness breath and fluid retention. Initial encounter. EXAM: CHEST - 2 VIEW COMPARISON:  08/26/2012 chest CT.  06/10/2010 chest x-ray. FINDINGS: Central pulmonary vascular prominence without pulmonary edema. Elevated hemidiaphragms. No segmental consolidation or pneumothorax. Heart size within normal limits. Calcified slightly tortuous aorta. Left breast calcifications. Chronic T12 compression fracture with anterior wedging similar to prior CT. Interval mild loss of height L1 vertebra. Shoulder joint degenerative changes greater on left. IMPRESSION: Central pulmonary vascular prominence without pulmonary edema. Chronic T12 compression fracture with anterior wedging similar to prior CT. Interval mild loss of height L1 vertebra. Aortic Atherosclerosis (ICD10-I70.0). Electronically Signed   By: Genia Del M.D.   On: 06/09/2017 15:00    Procedures Procedures (including critical  care time)  Medications Ordered in ED Medications - No data to display   Initial Impression / Assessment and Plan / ED Course  I have reviewed the triage vital signs and the nursing notes.  Pertinent labs & imaging results that were available during my care of the patient were reviewed by me and considered in my medical decision making (see chart for details).  Clinical Course as of Jun 11 2115  Molli Knock Jun 09, 2017  2041 Had a long talk with the patient and her family about their expectations with her fluid overload.  We talked about admission for continued diuresis versus home and increasing her Lasix.  She would rather go home and so I impressed upon her the need for a low sodium diet along with close follow-up with her primary care doctor.  She also will need to follow her daily weights.  Concerned she may also need a follow-up echo as she last one was in 2015.  [MB]    Clinical Course User Index [MB] Hayden Rasmussen, MD    Final Clinical Impressions(s) / ED Diagnoses   Final diagnoses:  Peripheral edema  Hypokalemia    ED Discharge Orders    None       Hayden Rasmussen, MD 06/10/17 2117

## 2017-06-09 NOTE — Discharge Instructions (Signed)
Your evaluated in the emergency department for worsening of your lower extremity edema.  We are asking you to increase your furosemide to 80 mg daily.  You possibly will also need a potassium supplement.  Please call your primary care doctor and let them know what is going on and see if they can reevaluate you.  You should check daily weights and use a low-sodium diet.  If you have any worsening symptoms she should return to the emergency department.

## 2017-06-09 NOTE — ED Notes (Signed)
Pt ambulatory to waiting room. Pt verbalized understanding of discharge instructions.   

## 2017-06-09 NOTE — ED Triage Notes (Signed)
PT c/o SOB on exertion and swelling to lower extremities and tightness to chest x3 days.

## 2017-06-11 DIAGNOSIS — Z6841 Body Mass Index (BMI) 40.0 and over, adult: Secondary | ICD-10-CM | POA: Diagnosis not present

## 2017-06-11 DIAGNOSIS — R600 Localized edema: Secondary | ICD-10-CM | POA: Diagnosis not present

## 2017-06-16 ENCOUNTER — Ambulatory Visit (HOSPITAL_COMMUNITY)
Admission: RE | Admit: 2017-06-16 | Discharge: 2017-06-16 | Disposition: A | Discharge status: Home / Self Care | Payer: PPO | Source: Ambulatory Visit | Attending: Family Medicine | Admitting: Family Medicine

## 2017-06-16 ENCOUNTER — Other Ambulatory Visit (HOSPITAL_COMMUNITY): Payer: Self-pay | Admitting: Family Medicine

## 2017-06-16 DIAGNOSIS — M7989 Other specified soft tissue disorders: Secondary | ICD-10-CM | POA: Diagnosis not present

## 2017-06-16 DIAGNOSIS — Z6841 Body Mass Index (BMI) 40.0 and over, adult: Secondary | ICD-10-CM | POA: Diagnosis not present

## 2017-06-16 DIAGNOSIS — R600 Localized edema: Secondary | ICD-10-CM | POA: Diagnosis not present

## 2017-06-16 DIAGNOSIS — D696 Thrombocytopenia, unspecified: Secondary | ICD-10-CM | POA: Diagnosis not present

## 2017-06-16 DIAGNOSIS — D649 Anemia, unspecified: Secondary | ICD-10-CM | POA: Diagnosis not present

## 2017-06-16 DIAGNOSIS — K7469 Other cirrhosis of liver: Secondary | ICD-10-CM | POA: Diagnosis not present

## 2017-06-20 ENCOUNTER — Encounter: Payer: Self-pay | Admitting: Gynecology

## 2017-06-20 ENCOUNTER — Ambulatory Visit: Payer: PPO | Admitting: Gynecology

## 2017-06-20 VITALS — BP 126/84

## 2017-06-20 DIAGNOSIS — K649 Unspecified hemorrhoids: Secondary | ICD-10-CM | POA: Diagnosis not present

## 2017-06-20 MED ORDER — LIDOCAINE-HYDROCORTISONE ACE 3-0.5 % RE CREA: TOPICAL_CREAM | RECTAL | 0 refills | Status: AC

## 2017-06-20 NOTE — Progress Notes (Signed)
    Melanie Porter 1941-02-20 [id number]        77 y.o.  [id number] presents complaining of bleeding hemorrhoid.  Former patient of Dr. Toney Rakes.  Over the last several months patient's had several episodes of pain and bleeding from external hemorrhoids.  Has been trying OTC products without relief.  Status post hysterectomy in the past.  No vaginal discharge or odor.  No diarrhea constipation or abdominal pain.  Reports colonoscopy last year.  Past medical history,surgical history, problem list, medications, allergies, family history and social history were all reviewed and documented in the EPIC chart.  Directed ROS with pertinent positives and negatives documented in the history of present illness/assessment and plan.  Exam: Caryn Bee assistant Vitals:   06/20/17 0916  BP: 126/84   General appearance:  Normal Abdomen obese soft nontender without masses Pelvic external BUS vagina with atrophic changes.  First-degree cystocele.  Cuff well supported.  Bimanual without masses or tenderness. Perianal exam with small scabbed hemorrhoid not actively bleeding at 9 o'clock position.  No acute thrombosis or fissures.  Digital rectal exam is normal with bleeding hemorrhoid. Lower extremities with 2+ pitting edema to her thighs.  Assessment/Plan:  77 y.o. G2P2002 will treat with AnaMantle HC equivalent several times daily.  We will follow-up if continues to be an issue.  With pitting edema bilaterally to her thighs.  She is actively being followed by her primary physician for this with a new prescription for Lasix 80 mg.  I have encouraged her to get started on that and to follow-up with her primary physician.    Anastasio Auerbach MD, 9:33 AM 06/20/2017

## 2017-06-20 NOTE — Patient Instructions (Signed)
Apply the prescribed cream to the bleeding hemorrhoid area 4 times daily as needed.

## 2017-06-25 ENCOUNTER — Encounter: Payer: Self-pay | Admitting: Gastroenterology

## 2017-06-29 ENCOUNTER — Other Ambulatory Visit: Payer: Self-pay

## 2017-06-29 ENCOUNTER — Emergency Department (HOSPITAL_COMMUNITY): Payer: PPO

## 2017-06-29 ENCOUNTER — Encounter (HOSPITAL_COMMUNITY): Payer: Self-pay | Admitting: *Deleted

## 2017-06-29 ENCOUNTER — Inpatient Hospital Stay (HOSPITAL_COMMUNITY)
Admission: EM | Admit: 2017-06-29 | Discharge: 2017-07-02 | DRG: 433 | Disposition: A | Discharge status: Home / Self Care | Payer: PPO | Attending: Internal Medicine | Admitting: Internal Medicine

## 2017-06-29 DIAGNOSIS — D638 Anemia in other chronic diseases classified elsewhere: Secondary | ICD-10-CM | POA: Diagnosis present

## 2017-06-29 DIAGNOSIS — K7460 Unspecified cirrhosis of liver: Secondary | ICD-10-CM | POA: Diagnosis present

## 2017-06-29 DIAGNOSIS — D696 Thrombocytopenia, unspecified: Secondary | ICD-10-CM | POA: Diagnosis present

## 2017-06-29 DIAGNOSIS — M797 Fibromyalgia: Secondary | ICD-10-CM | POA: Diagnosis present

## 2017-06-29 DIAGNOSIS — R188 Other ascites: Secondary | ICD-10-CM | POA: Diagnosis present

## 2017-06-29 DIAGNOSIS — J9811 Atelectasis: Secondary | ICD-10-CM | POA: Diagnosis not present

## 2017-06-29 DIAGNOSIS — E669 Obesity, unspecified: Secondary | ICD-10-CM | POA: Diagnosis present

## 2017-06-29 DIAGNOSIS — E039 Hypothyroidism, unspecified: Secondary | ICD-10-CM | POA: Diagnosis present

## 2017-06-29 DIAGNOSIS — D649 Anemia, unspecified: Secondary | ICD-10-CM | POA: Diagnosis present

## 2017-06-29 DIAGNOSIS — E7800 Pure hypercholesterolemia, unspecified: Secondary | ICD-10-CM | POA: Diagnosis present

## 2017-06-29 DIAGNOSIS — I340 Nonrheumatic mitral (valve) insufficiency: Secondary | ICD-10-CM | POA: Diagnosis not present

## 2017-06-29 DIAGNOSIS — Z7982 Long term (current) use of aspirin: Secondary | ICD-10-CM | POA: Diagnosis not present

## 2017-06-29 DIAGNOSIS — K7031 Alcoholic cirrhosis of liver with ascites: Secondary | ICD-10-CM | POA: Diagnosis not present

## 2017-06-29 DIAGNOSIS — F419 Anxiety disorder, unspecified: Secondary | ICD-10-CM | POA: Diagnosis present

## 2017-06-29 DIAGNOSIS — M48061 Spinal stenosis, lumbar region without neurogenic claudication: Secondary | ICD-10-CM

## 2017-06-29 DIAGNOSIS — R601 Generalized edema: Secondary | ICD-10-CM

## 2017-06-29 DIAGNOSIS — Z79899 Other long term (current) drug therapy: Secondary | ICD-10-CM

## 2017-06-29 DIAGNOSIS — F329 Major depressive disorder, single episode, unspecified: Secondary | ICD-10-CM | POA: Diagnosis present

## 2017-06-29 DIAGNOSIS — R161 Splenomegaly, not elsewhere classified: Secondary | ICD-10-CM | POA: Diagnosis present

## 2017-06-29 DIAGNOSIS — E038 Other specified hypothyroidism: Secondary | ICD-10-CM | POA: Diagnosis not present

## 2017-06-29 DIAGNOSIS — Z6841 Body Mass Index (BMI) 40.0 and over, adult: Secondary | ICD-10-CM

## 2017-06-29 DIAGNOSIS — E8770 Fluid overload, unspecified: Secondary | ICD-10-CM | POA: Diagnosis not present

## 2017-06-29 DIAGNOSIS — D6959 Other secondary thrombocytopenia: Secondary | ICD-10-CM | POA: Diagnosis present

## 2017-06-29 DIAGNOSIS — K7581 Nonalcoholic steatohepatitis (NASH): Secondary | ICD-10-CM | POA: Diagnosis present

## 2017-06-29 DIAGNOSIS — K219 Gastro-esophageal reflux disease without esophagitis: Secondary | ICD-10-CM | POA: Diagnosis present

## 2017-06-29 DIAGNOSIS — E785 Hyperlipidemia, unspecified: Secondary | ICD-10-CM | POA: Diagnosis present

## 2017-06-29 DIAGNOSIS — E782 Mixed hyperlipidemia: Secondary | ICD-10-CM | POA: Diagnosis not present

## 2017-06-29 DIAGNOSIS — R197 Diarrhea, unspecified: Secondary | ICD-10-CM

## 2017-06-29 LAB — COMPREHENSIVE METABOLIC PANEL
ALT: 20 U/L (ref 14–54)
AST: 44 U/L — AB (ref 15–41)
Albumin: 2.4 g/dL — ABNORMAL LOW (ref 3.5–5.0)
Alkaline Phosphatase: 159 U/L — ABNORMAL HIGH (ref 38–126)
Anion gap: 9 (ref 5–15)
BILIRUBIN TOTAL: 1.5 mg/dL — AB (ref 0.3–1.2)
BUN: 9 mg/dL (ref 6–20)
CALCIUM: 9.5 mg/dL (ref 8.9–10.3)
CO2: 25 mmol/L (ref 22–32)
CREATININE: 0.76 mg/dL (ref 0.44–1.00)
Chloride: 102 mmol/L (ref 101–111)
GFR calc Af Amer: >60 mL/min (ref >60)
Glucose, Bld: 110 mg/dL — ABNORMAL HIGH (ref 65–99)
Potassium: 4 mmol/L (ref 3.5–5.1)
Sodium: 136 mmol/L (ref 135–145)
TOTAL PROTEIN: 5.9 g/dL — AB (ref 6.5–8.1)

## 2017-06-29 LAB — CBC
HEMATOCRIT: 32 % — AB (ref 36.0–46.0)
Hemoglobin: 10.7 g/dL — ABNORMAL LOW (ref 12.0–15.0)
MCH: 35 pg — ABNORMAL HIGH (ref 26.0–34.0)
MCHC: 33.4 g/dL (ref 30.0–36.0)
MCV: 104.6 fL — AB (ref 78.0–100.0)
Platelets: 83 10*3/uL — ABNORMAL LOW (ref 150–400)
RBC: 3.06 MIL/uL — AB (ref 3.87–5.11)
RDW: 13.5 % (ref 11.5–15.5)
WBC: 5.4 10*3/uL (ref 4.0–10.5)

## 2017-06-29 LAB — TROPONIN I: Troponin I: <0.03 ng/mL (ref <0.03)

## 2017-06-29 LAB — BRAIN NATRIURETIC PEPTIDE: B Natriuretic Peptide: 41 pg/mL (ref 0.0–100.0)

## 2017-06-29 MED ORDER — FUROSEMIDE 10 MG/ML IJ SOLN
40.0000 mg | Freq: Once | INTRAMUSCULAR | Status: AC
  Administered 2017-06-30: 40 mg via INTRAVENOUS
  Filled 2017-06-29: qty 4

## 2017-06-29 NOTE — ED Triage Notes (Signed)
Pt reports abdominal swelling, bilateral lower leg edema as well as n/v/d.

## 2017-06-29 NOTE — ED Provider Notes (Signed)
[facility] EMERGENCY DEPARTMENT Provider Note   CSN: [id number] Arrival date & time: 06/29/17  2137     History   Chief Complaint Chief Complaint  Patient presents with  . Shortness of Breath    HPI [name] Gambrel is a 77 y.o. female.  HPI Patient presents to the emergency room for evaluation of abdominal swelling lower leg edema as well as some diarrhea.  Patient states she has had trouble with leg swelling and shortness of breath for several months.  She has been taking diuretics but her symptoms have persisted.  She was seen in the emergency room earlier this month and had close follow-up with her cardiologist.  They have tried increasing her diuretic doses.  Patient has felt increasing leg swelling.  She also feels swollen in her abdomen and flanks.  It makes it difficult for her to walk.  Today she started having several loose stools.  She had difficulty getting to the bathroom and had diarrhea on herself and on the floor.  Patient states when she tried to bend over to clean up it became too difficult.  She gets more short of breath when she exerts herself and when she lies flat.  She denies any fevers.  She denies any chest pain or abdominal pain. Past Medical History:  Diagnosis Date  . Ankle fracture, right 05/21/10  . Anxiety   . Arthritis   . Back fracture 05/21/10  . Depression   . Fibromyalgia   . GERD (gastroesophageal reflux disease)   . Hypercholesteremia   . 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 Knee fracture, right 05/21/10  . Nonalcoholic steatohepatitis (NASH) BMI > 40   NOV 2013 CHILD PUGH A, MELD SCORE 7. NOV 2017 CHILD PUGH B, MELD 9  . Obesity   . Splenomegaly 09/23/2012  . Thrombocytopenia ([place]) 02/19/2012    Patient Active Problem List   Diagnosis Date Noted  . Spinal stenosis of lumbar region 01/25/2014  . PUD (peptic ulcer disease) 11/18/2013  . Undiagnosed cardiac murmurs 11/18/2013  . Osteopenia 07/20/2013  . Vaginal  atrophy 07/20/2013  . Platelet disorder(CLUMPING) 12/28/2012  . Dermatitis perineal 12/28/2012  . Splenomegaly 09/23/2012  . 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 Thrombocytopenia ([place]) 02/19/2012  . Fibromyalgia 02/04/2012  . Hyperlipidemia 02/04/2012  . Arthritis 02/04/2012  . GERD (gastroesophageal reflux disease) 02/04/2012    Past Surgical History:  Procedure Laterality Date  . ABDOMINAL HYSTERECTOMY    . BREAST BIOPSY  1982   left benign  . CATARACT EXTRACTION W/PHACO Right 07/11/2015   Procedure: CATARACT EXTRACTION PHACO AND INTRAOCULAR LENS PLACEMENT (IOC);  Surgeon: Rutherford Guys, MD;  Location: [facility];  Service: Ophthalmology;  Laterality: Right;  CDE: 6.00  . CATARACT EXTRACTION W/PHACO Left 07/25/2015   Procedure: CATARACT EXTRACTION PHACO AND INTRAOCULAR LENS PLACEMENT (IOC);  Surgeon: Rutherford Guys, MD;  Location: [facility];  Service: Ophthalmology;  Laterality: Left;  CDE:8.58  . COLONOSCOPY  June 2010   Dr. Collene Mares: few scattered sigmoid diverticula, otherwise normal. Screening in 2020   . ESOPHAGOGASTRODUODENOSCOPY  03/02/2012   Dr. Oneida Alar:The mucosa of the esophagus appeared normal Multiple non-bleeding ulcers, ranging between 3-18mm in size were found in the gastric antrum  The duodenal mucosa showed no abnormalities in the bulb and second portion of the duodenum. path negative for H.pylori  . ESOPHAGOGASTRODUODENOSCOPY N/A 07/15/2014   SLF: 1. No esophageal, gastric, or duodenal varices 2. small  1-2 mm gastric ulcer. Negative H.pylori   . ESOPHAGOGASTRODUODENOSCOPY N/A 06/05/2015   SLF: 1. Mild gastritis on ASA and PPI  . PARTIAL HYSTERECTOMY       OB History    Gravida  2   Para  2   Term  2   Preterm      AB      Living  2     SAB      TAB      Ectopic      Multiple      Live Births  2            Home Medications    Prior to Admission medications     Medication Sig Start Date End Date Taking? Authorizing Provider  aspirin EC 81 MG tablet Take 81 mg by mouth at bedtime.   Yes [provider]  Cholecalciferol (VITAMIN D3) 5000 units CAPS Take 1 capsule daily by mouth.   Yes [provider]  FLUoxetine (PROZAC) 20 MG capsule Take 20 mg by mouth daily. 12/25/15  Yes [provider]  furosemide (LASIX) 40 MG tablet Take 40 mg by mouth 2 (two) times daily.   Yes [provider]  levothyroxine (SYNTHROID, LEVOTHROID) 75 MCG tablet Take 75 mcg by mouth daily before breakfast.   Yes [provider]  loratadine-pseudoephedrine (CLARITIN-D 24-HOUR) 10-240 MG 24 hr tablet Take 1 tablet by mouth daily as needed for allergies.   Yes [provider]  pantoprazole (PROTONIX) 40 MG tablet Take 1 tablet (40 mg total) by mouth daily. 03/23/15  Yes Annitta Needs, NP  Propylene Glycol (SYSTANE BALANCE) 0.6 % SOLN Apply 1 drop to eye daily as needed. Dry Eyes   Yes [provider]  simvastatin (ZOCOR) 40 MG tablet Take 40 mg by mouth daily.    Yes [provider]  spironolactone (ALDACTONE) 50 MG tablet Take 1 tablet (50 mg total) by mouth daily. WITH LASIX 02/19/17  Yes Fields, Sandi L, MD  furosemide (LASIX) 80 MG tablet Take 1 tablet (80 mg total) by mouth daily. Patient not taking: Reported on 06/20/2017 06/09/17   Hayden Rasmussen, MD  lidocaine-hydrocortisone [facility]) 3-0.5 % CREA Apply to the external hemorrhoid area 4 times daily as needed 06/20/17   Fontaine, Belinda Block, MD    Family History Family History  Problem Relation Age of Onset  . Heart disease Father   . Heart attack Father   . Aneurysm Mother   . Diabetes Sister   . Hypertension Sister   . Heart disease Sister   . Hypertension Brother   . Heart disease Brother   . Colon cancer Neg Hx     Social History Social History   Tobacco Use  . Smoking status: Never Smoker  . Smokeless tobacco: Never Used  .  Tobacco comment: Never smoked  Substance Use Topics  . Alcohol use: No    Alcohol/week: 0.0 oz  . Drug use: No     Allergies   Patient has no known allergies.   Review of Systems Review of Systems  All other systems reviewed and are negative.    Physical Exam Updated Vital Signs BP (!) 97/59 (BP Location: Left Arm)   Pulse 93   Temp 97.9 F (36.6 C) (Oral)   Resp (!) 24   Ht 1.575 m (5\' 2" )   Wt 104.8 kg (231 lb)   SpO2 96%   BMI 42.25 kg/m   Physical Exam  Constitutional:  No distress.  Elderly  HENT:  Head: Normocephalic and atraumatic.  Right Ear: External ear normal.  Left Ear: External ear normal.  Eyes: Conjunctivae are normal. Right eye exhibits no discharge. Left eye exhibits no discharge. No scleral icterus.  Neck: Neck supple. No tracheal deviation present.  Cardiovascular: Normal rate, regular rhythm and intact distal pulses.  Murmur ( Systolic) heard. Pulmonary/Chest: Effort normal and breath sounds normal. No stridor. No respiratory distress. She has no wheezes. She has no rales.  Abdominal: Soft. Bowel sounds are normal. She exhibits no distension. There is no tenderness. There is no rebound and no guarding.  Protuberant abdomen  Musculoskeletal: She exhibits no tenderness.       Right lower leg: She exhibits edema.       Left lower leg: She exhibits edema.  Neurological: She is alert. She has normal strength. No cranial nerve deficit (no facial droop, extraocular movements intact, no slurred speech) or sensory deficit. She exhibits normal muscle tone. She displays no seizure activity. Coordination normal.  Skin: Skin is warm and dry. No rash noted.  Psychiatric: She has a normal mood and affect.  Nursing note and vitals reviewed.    ED Treatments / Results  Labs (all labs ordered are listed, but only abnormal results are displayed) Labs Reviewed  CBC - Abnormal; Notable for the following components:      Result Value   RBC 3.06 (*)     Hemoglobin 10.7 (*)    HCT 32.0 (*)    MCV 104.6 (*)    MCH 35.0 (*)    Platelets 83 (*)    All other components within normal limits  COMPREHENSIVE METABOLIC PANEL - Abnormal; Notable for the following components:   Glucose, Bld 110 (*)    Total Protein 5.9 (*)    Albumin 2.4 (*)    AST 44 (*)    Alkaline Phosphatase 159 (*)    Total Bilirubin 1.5 (*)    All other components within normal limits  TROPONIN I  BRAIN NATRIURETIC PEPTIDE    EKG EKG Interpretation  Date/Time:  Sunday June 29 2017 21:53:06 EDT Ventricular Rate:  93 PR Interval:    QRS Duration: 103 QT Interval:  380 QTC Calculation: 473 R Axis:   -34 Text Interpretation:  Sinus rhythm Abnormal R-wave progression, early transition LVH with secondary repolarization abnormality Anterior Q waves, possibly due to LVH Baseline wander in lead(s) I III aVL V2 V6 Confirmed by Dorie Rank [phone]) on 06/29/2017 10:03:52 PM   Radiology Dg Chest 2 View  Result Date: 06/29/2017 CLINICAL DATA:  Dyspnea EXAM: CHEST - 2 VIEW COMPARISON:  06/09/2017 chest radiograph. FINDINGS: Low lung volumes. Stable cardiomediastinal silhouette with normal heart size. No pneumothorax. No pleural effusion. No overt pulmonary edema. Mild bibasilar atelectasis. No acute consolidative airspace disease. IMPRESSION: Low lung volumes with mild bibasilar atelectasis. Electronically Signed   By: Ilona Sorrel M.D.   On: 06/29/2017 23:28    Procedures Procedures (including critical care time)  Medications Ordered in ED Medications  furosemide (LASIX) injection 40 mg (has no administration in time range)     Initial Impression / Assessment and Plan / ED Course  I have reviewed the triage vital signs and the nursing notes.  Pertinent labs & imaging results that were available during my care of the patient were reviewed by me and considered in my medical decision making (see chart for details).  Clinical Course as of Jun 29 2349  Sun Jun 29, 2017  2347 BNP normal.  Bili elevated with decreased albumin.  ?chronic liver disease   [JK]  2347 No pulm edema on cxr   [JK]  2348 Mild anemia   [JK]    Clinical Course User Index [JK] Dorie Rank, MD    Patient presents to the emergency room for evaluation of increasing edema.  Patient also complained of shortness of breath on exertion.  She does not have any evidence of pulmonary edema on x-ray.  She does have diffuse edema on exam consistent with anasarca.  I suspect this is related to her cirrhosis rather than congestive heart failure.  I think the patient would benefit from diuresis overnight considering her difficulty ambulating and her worsening shortness of breath.  Her abdomen is rather large she may have a degree of ascites.  She may benefit from an ultrasound in the morning when ultrasound is available.  I will consult with medical service to discuss admission and further treatment.  Final Clinical Impressions(s) / ED Diagnoses   Final diagnoses:  Anasarca      Dorie Rank, MD 06/29/17 2351

## 2017-06-29 NOTE — ED Notes (Signed)
EKG given to Dr Tomi Bamberger

## 2017-06-30 ENCOUNTER — Observation Stay (HOSPITAL_BASED_OUTPATIENT_CLINIC_OR_DEPARTMENT_OTHER): Payer: PPO

## 2017-06-30 ENCOUNTER — Encounter (HOSPITAL_COMMUNITY): Payer: Self-pay

## 2017-06-30 ENCOUNTER — Other Ambulatory Visit: Payer: Self-pay

## 2017-06-30 ENCOUNTER — Observation Stay (HOSPITAL_COMMUNITY): Payer: PPO

## 2017-06-30 DIAGNOSIS — R161 Splenomegaly, not elsewhere classified: Secondary | ICD-10-CM

## 2017-06-30 DIAGNOSIS — R601 Generalized edema: Secondary | ICD-10-CM

## 2017-06-30 DIAGNOSIS — R197 Diarrhea, unspecified: Secondary | ICD-10-CM

## 2017-06-30 DIAGNOSIS — E039 Hypothyroidism, unspecified: Secondary | ICD-10-CM

## 2017-06-30 DIAGNOSIS — R188 Other ascites: Secondary | ICD-10-CM

## 2017-06-30 DIAGNOSIS — E8770 Fluid overload, unspecified: Secondary | ICD-10-CM | POA: Diagnosis not present

## 2017-06-30 DIAGNOSIS — D696 Thrombocytopenia, unspecified: Secondary | ICD-10-CM

## 2017-06-30 DIAGNOSIS — E038 Other specified hypothyroidism: Secondary | ICD-10-CM

## 2017-06-30 DIAGNOSIS — I340 Nonrheumatic mitral (valve) insufficiency: Secondary | ICD-10-CM | POA: Diagnosis not present

## 2017-06-30 DIAGNOSIS — K7460 Unspecified cirrhosis of liver: Principal | ICD-10-CM

## 2017-06-30 LAB — BASIC METABOLIC PANEL
Anion gap: 7 (ref 5–15)
BUN: 9 mg/dL (ref 6–20)
CHLORIDE: 103 mmol/L (ref 101–111)
CO2: 27 mmol/L (ref 22–32)
CREATININE: 0.65 mg/dL (ref 0.44–1.00)
Calcium: 9.5 mg/dL (ref 8.9–10.3)
GFR calc Af Amer: >60 mL/min (ref >60)
GFR calc non Af Amer: >60 mL/min (ref >60)
GLUCOSE: 98 mg/dL (ref 65–99)
Potassium: 3.7 mmol/L (ref 3.5–5.1)
Sodium: 137 mmol/L (ref 135–145)

## 2017-06-30 LAB — GRAM STAIN

## 2017-06-30 LAB — ECHOCARDIOGRAM COMPLETE
Height: 62 in
Weight: 3795.44 oz

## 2017-06-30 LAB — CBC
HEMATOCRIT: 31.2 % — AB (ref 36.0–46.0)
Hemoglobin: 10.3 g/dL — ABNORMAL LOW (ref 12.0–15.0)
MCH: 34.9 pg — AB (ref 26.0–34.0)
MCHC: 33 g/dL (ref 30.0–36.0)
MCV: 105.8 fL — AB (ref 78.0–100.0)
PLATELETS: 55 10*3/uL — AB (ref 150–400)
RBC: 2.95 MIL/uL — ABNORMAL LOW (ref 3.87–5.11)
RDW: 13.7 % (ref 11.5–15.5)
WBC: 3.9 10*3/uL — ABNORMAL LOW (ref 4.0–10.5)

## 2017-06-30 LAB — TSH: TSH: 6.025 u[IU]/mL — ABNORMAL HIGH (ref 0.350–4.500)

## 2017-06-30 LAB — BODY FLUID CELL COUNT WITH DIFFERENTIAL
Eos, Fluid: 0 %
LYMPHS FL: 54 %
Monocyte-Macrophage-Serous Fluid: 19 % — ABNORMAL LOW (ref 50–90)
NEUTROPHIL FLUID: 27 % — AB (ref 0–25)
Total Nucleated Cell Count, Fluid: 174 cu mm (ref 0–1000)

## 2017-06-30 LAB — GLUCOSE, CAPILLARY: Glucose-Capillary: 87 mg/dL (ref 65–99)

## 2017-06-30 MED ORDER — LORATADINE-PSEUDOEPHEDRINE ER 10-240 MG PO TB24: 1.0000 | ORAL_TABLET | Freq: Every day | ORAL | Status: DC | PRN

## 2017-06-30 MED ORDER — POLYVINYL ALCOHOL 1.4 % OP SOLN
1.0000 [drp] | Freq: Every day | OPHTHALMIC | Status: DC | PRN
  Filled 2017-06-30: qty 15

## 2017-06-30 MED ORDER — ACETAMINOPHEN 325 MG PO TABS
650.0000 mg | ORAL_TABLET | Freq: Four times a day (QID) | ORAL | Status: DC | PRN
  Filled 2017-06-30: qty 2

## 2017-06-30 MED ORDER — SODIUM CHLORIDE 0.9% FLUSH: 3.0000 mL | INTRAVENOUS | Status: DC | PRN

## 2017-06-30 MED ORDER — LEVOTHYROXINE SODIUM 50 MCG PO TABS
75.0000 ug | ORAL_TABLET | Freq: Every day | ORAL | Status: DC
  Administered 2017-06-30 – 2017-07-02 (×3): 75 ug via ORAL
  Filled 2017-06-30 (×3): qty 2

## 2017-06-30 MED ORDER — SIMVASTATIN 10 MG PO TABS
40.0000 mg | ORAL_TABLET | Freq: Every day | ORAL | Status: DC
  Administered 2017-06-30 – 2017-07-02 (×3): 40 mg via ORAL
  Filled 2017-06-30 (×3): qty 4

## 2017-06-30 MED ORDER — PANTOPRAZOLE SODIUM 40 MG PO TBEC
40.0000 mg | DELAYED_RELEASE_TABLET | Freq: Every day | ORAL | Status: DC
  Administered 2017-06-30 – 2017-07-02 (×3): 40 mg via ORAL
  Filled 2017-06-30 (×3): qty 1

## 2017-06-30 MED ORDER — LORATADINE 10 MG PO TABS: 10.0000 mg | ORAL_TABLET | Freq: Every day | ORAL | Status: DC | PRN

## 2017-06-30 MED ORDER — FLUOXETINE HCL 20 MG PO CAPS
20.0000 mg | ORAL_CAPSULE | Freq: Every day | ORAL | Status: DC
  Administered 2017-06-30 – 2017-07-02 (×3): 20 mg via ORAL
  Filled 2017-06-30 (×3): qty 1

## 2017-06-30 MED ORDER — FUROSEMIDE 10 MG/ML IJ SOLN
40.0000 mg | Freq: Two times a day (BID) | INTRAMUSCULAR | Status: DC
  Administered 2017-06-30 – 2017-07-01 (×3): 40 mg via INTRAVENOUS
  Filled 2017-06-30 (×3): qty 4

## 2017-06-30 MED ORDER — ACETAMINOPHEN 650 MG RE SUPP: 650.0000 mg | Freq: Four times a day (QID) | RECTAL | Status: DC | PRN

## 2017-06-30 MED ORDER — ONDANSETRON HCL 4 MG/2ML IJ SOLN
4.0000 mg | Freq: Four times a day (QID) | INTRAMUSCULAR | Status: DC | PRN
  Administered 2017-07-02: 4 mg via INTRAVENOUS
  Filled 2017-06-30: qty 2

## 2017-06-30 MED ORDER — SODIUM CHLORIDE 0.9% FLUSH
3.0000 mL | Freq: Two times a day (BID) | INTRAVENOUS | Status: DC
  Administered 2017-06-30 – 2017-07-02 (×5): 3 mL via INTRAVENOUS

## 2017-06-30 MED ORDER — SODIUM CHLORIDE 0.9 % IV SOLN: 250.0000 mL | INTRAVENOUS | Status: DC | PRN

## 2017-06-30 MED ORDER — VITAMIN D 1000 UNITS PO TABS
5000.0000 [IU] | ORAL_TABLET | Freq: Every day | ORAL | Status: DC
  Administered 2017-06-30 – 2017-07-02 (×3): 5000 [IU] via ORAL
  Filled 2017-06-30 (×3): qty 5

## 2017-06-30 MED ORDER — ONDANSETRON HCL 4 MG PO TABS: 4.0000 mg | ORAL_TABLET | Freq: Four times a day (QID) | ORAL | Status: DC | PRN

## 2017-06-30 MED ORDER — HYDROCORTISONE 2.5 % RE CREA
TOPICAL_CREAM | Freq: Two times a day (BID) | RECTAL | Status: DC
  Administered 2017-06-30 – 2017-07-02 (×4): via RECTAL
  Filled 2017-06-30: qty 28.35

## 2017-06-30 MED ORDER — LIDOCAINE-HYDROCORTISONE ACE 3-0.5 % RE CREA: TOPICAL_CREAM | Freq: Two times a day (BID) | RECTAL | Status: DC

## 2017-06-30 MED ORDER — SPIRONOLACTONE 50 MG PO TABS
50.0000 mg | ORAL_TABLET | Freq: Every day | ORAL | Status: DC
  Administered 2017-06-30: 50 mg via ORAL
  Filled 2017-06-30 (×3): qty 1

## 2017-06-30 MED ORDER — PSEUDOEPHEDRINE HCL ER 120 MG PO TB12
240.0000 mg | ORAL_TABLET | Freq: Every day | ORAL | Status: DC | PRN
  Filled 2017-06-30: qty 2

## 2017-06-30 NOTE — Procedures (Signed)
PreOperative Dx: Acites Postoperative Dx: Acites Procedure:   US guided paracentesis Radiologist:  Thornton Papas Anesthesia:  10 ml of1% lidocaine Specimen:  2.8 L of clear yellow ascitic fluid EBL:   < 1 ml Complications: None

## 2017-06-30 NOTE — Evaluation (Signed)
Physical Therapy Evaluation Patient Details Name: Melanie Porter MRN: [id number] DOB: Jun 01, 1940 Today's Date: 06/30/2017   History of Present Illness  77 yo female with onset of volume overload along with low lung volumes and anasarca was admitted, has PMHx:  [name] cirrhosis, CHF, old R ankle and back fractures, monoclonal gammopathy,   Clinical Impression  Pt was seen for evaluation of her mobility with a focus on her safety with RW on the hall, and noted some decline in speed and quality of gait by return to her room.  Her plan is to have HHPT follow her and pt will be set for equipment, but should try stairs before dc if time permits.    Follow Up Recommendations Home health PT;Supervision for mobility/OOB    Equipment Recommendations  None recommended by PT(has RW at home)    Recommendations for Other Services       Precautions / Restrictions Precautions Precautions: Fall Restrictions Weight Bearing Restrictions: No      Mobility  Bed Mobility Overal bed mobility: Needs Assistance Bed Mobility: Supine to Sit;Sit to Supine     Supine to sit: Min assist Sit to supine: Supervision   General bed mobility comments: minor power up assist to trunk and able to get to bed with extra time to pivot hips and lift legs  Transfers Overall transfer level: Modified independent Equipment used: Rolling walker (2 wheeled);1 person hand held assist             General transfer comment: able to stand from toilet with mod I  Ambulation/Gait Ambulation/Gait assistance: Min guard Ambulation Distance (Feet): 90 Feet Assistive device: Rolling walker (2 wheeled);1 person hand held assist(for safety) Gait Pattern/deviations: Step-to pattern;Step-through pattern;Decreased stride length;Wide base of support;Trunk flexed Gait velocity: reduced Gait velocity interpretation: Below normal speed for age/gender General Gait Details: pt tends to have the walker away from herself and can use HHA  to walk as well  Stairs            Wheelchair Mobility    Modified Rankin (Stroke Patients Only)       Balance Overall balance assessment: Needs assistance Sitting-balance support: Feet supported Sitting balance-Leahy Scale: Good     Standing balance support: Bilateral upper extremity supported;During functional activity Standing balance-Leahy Scale: Fair                               Pertinent Vitals/Pain Pain Assessment: No/denies pain    Home Living Family/patient expects to be discharged to:: Private residence Living Arrangements: Alone Available Help at Discharge: Family;Available PRN/intermittently Type of Home: House Home Access: Stairs to enter Entrance Stairs-Rails: Right;Left;Can reach both Entrance Stairs-Number of Steps: 3 Home Layout: One level Home Equipment: Walker - 2 wheels;Cane - single point;Shower seat      Prior Function Level of Independence: Independent with assistive device(s)         Comments: family nearby as daughter sleeps during the day to work at night     Journalist, newspaper        Extremity/Trunk Assessment   Upper Extremity Assessment Upper Extremity Assessment: Overall WFL for tasks assessed    Lower Extremity Assessment Lower Extremity Assessment: Generalized weakness    Cervical / Trunk Assessment Cervical / Trunk Assessment: Normal  Communication   Communication: No difficulties  Cognition Arousal/Alertness: Awake/alert Behavior During Therapy: WFL for tasks assessed/performed Overall Cognitive Status: Within Functional Limits for tasks assessed  General Comments General comments (skin integrity, edema, etc.): has IV in R hand that is painful and difficult to tolerate    Exercises     Assessment/Plan    PT Assessment Patient needs continued PT services  PT Problem List Decreased strength;Decreased range of motion;Decreased  balance;Decreased activity tolerance;Decreased mobility;Decreased coordination;Decreased safety awareness;Obesity       PT Treatment Interventions DME instruction;Gait training;Stair training;Functional mobility training;Therapeutic activities;Therapeutic exercise;Balance training;Neuromuscular re-education;Patient/family education    PT Goals (Current goals can be found in the Care Plan section)  Acute Rehab PT Goals Patient Stated Goal: to get stronger and get her endurance back PT Goal Formulation: With patient Time For Goal Achievement: 07/14/17 Potential to Achieve Goals: Good    Frequency Min 3X/week   Barriers to discharge Inaccessible home environment;Decreased caregiver support children are available but daughter is sleeping during the day    Co-evaluation               AM-PAC PT "6 Clicks" Daily Activity  Outcome Measure Difficulty turning over in bed (including adjusting bedclothes, sheets and blankets)?: A Little Difficulty moving from lying on back to sitting on the side of the bed? : A Little Difficulty sitting down on and standing up from a chair with arms (e.g., wheelchair, bedside commode, etc,.)?: A Little Help needed moving to and from a bed to chair (including a wheelchair)?: A Little Help needed walking in hospital room?: A Little Help needed climbing 3-5 steps with a railing? : A Lot 6 Click Score: 17    End of Session Equipment Utilized During Treatment: Gait belt Activity Tolerance: Patient tolerated treatment well;Patient limited by fatigue Patient left: in bed;with call bell/phone within reach;with bed alarm set Nurse Communication: Mobility status PT Visit Diagnosis: Unsteadiness on feet (R26.81);Repeated falls (R29.6);Muscle weakness (generalized) (M62.81);Difficulty in walking, not elsewhere classified (R26.2)    Time: 5974-1638 PT Time Calculation (min) (ACUTE ONLY): 27 min   Charges:   PT Evaluation $PT Eval Moderate Complexity: 1  Mod PT Treatments $Gait Training: 8-22 mins   PT G Codes:        Ramond Dial 07-21-17, 12:36 PM   Mee Hives, PT MS Acute Rehab Dept. Number: [place] [facility] [place]

## 2017-06-30 NOTE — Progress Notes (Signed)
Stated feels better since fluid removal.  Abd is softer.

## 2017-06-30 NOTE — Progress Notes (Signed)
Paracentesis complete no signs of distress, 2.8L ascites of  removed.

## 2017-06-30 NOTE — H&P (Addendum)
History and Physical    [name] NLG:[id number] DOB: 12-Sep-1940 DOA: 06/29/2017  PCP: Sharilyn Sites, MD   Patient coming from: Home  Chief Complaint: Worsening edema/SOB/Diarrhea  HPI: Melanie Porter is a 77 y.o. female with medical history significant for dyslipidemia, hypothyroidism, [place] cirrhosis, splenomegaly with thrombocytopenia, anxiety/depression, and grade 1 diastolic dysfunction on echo 10/2013, who presents with worsening lower extremity edema as well as abdominal swelling that has been ongoing over the last several months which has now led to some shortness of breath despite taking her home diuretics of Lasix and spironolactone.  She additionally started to have some diarrhea yesterday.  Her main concern is not only the swelling that continues to persist, but now her difficulty in ambulation that is associated with the swelling as well as the dyspnea particularly on exertion.  The diarrhea is troublesome because she has difficulty getting to the bathroom and has a hard time cleaning up after herself with her dyspnea and volume overload.  She otherwise lives independently and was actually seen in the ED on 3/11 at which point she was told to start taking Lasix 40 mg twice a day, which it appears that she has been doing, but is also taking her spironolactone 50 mg twice a day.  She states that she has been careful with watching her fluid intake as well as her sodium intake. She has some mild nausea but denies any vomiting.   Denies any fever, chills, or abdominal pain. She denies any chest pain, orthopnea, or PND.  She simply states that she starts to become dyspneic more so when she walks around.   ED Course: Vital signs are noted to be stable with some initial soft BP readings which have improved.  She is actually on room air with saturations in the high 90th percentile.  Her laboratory data remained stable from prior with noted anemia and thrombocytopenia.  Her creatinine is  stable per her usual baseline and she does not appear to have CKD.  BNP is 41 and two-view chest x-ray does not demonstrate any volume overload.  Lasix 40 mg IV has been given in ED.  Review of Systems: All others reviewed and otherwise negative.  Past Medical History:  Diagnosis Date  . Ankle fracture, right 05/21/10  . Anxiety   . Arthritis   . Back fracture 05/21/10  . Depression   . Fibromyalgia   . GERD (gastroesophageal reflux disease)   . Hypercholesteremia   . 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 Knee fracture, right 05/21/10  . Nonalcoholic steatohepatitis (NASH) BMI > 40   NOV 2013 CHILD PUGH A, MELD SCORE 7. NOV 2017 CHILD PUGH B, MELD 9  . Obesity   . Splenomegaly 09/23/2012  . Thrombocytopenia ([place]) 02/19/2012    Past Surgical History:  Procedure Laterality Date  . ABDOMINAL HYSTERECTOMY    . BREAST BIOPSY  1982   left benign  . CATARACT EXTRACTION W/PHACO Right 07/11/2015   Procedure: CATARACT EXTRACTION PHACO AND INTRAOCULAR LENS PLACEMENT (IOC);  Surgeon: Rutherford Guys, MD;  Location: [facility];  Service: Ophthalmology;  Laterality: Right;  CDE: 6.00  . CATARACT EXTRACTION W/PHACO Left 07/25/2015   Procedure: CATARACT EXTRACTION PHACO AND INTRAOCULAR LENS PLACEMENT (IOC);  Surgeon: Rutherford Guys, MD;  Location: [facility];  Service: Ophthalmology;  Laterality: Left;  CDE:8.58  . COLONOSCOPY  June 2010   Dr. Collene Mares: few scattered sigmoid diverticula, otherwise normal. Screening in 2020   . ESOPHAGOGASTRODUODENOSCOPY  03/02/2012  Dr. Oneida Alar:The mucosa of the esophagus appeared normal Multiple non-bleeding ulcers, ranging between 3-72mm in size were found in the gastric antrum  The duodenal mucosa showed no abnormalities in the bulb and second portion of the duodenum. path negative for H.pylori  . ESOPHAGOGASTRODUODENOSCOPY N/A 07/15/2014   SLF: 1. No esophageal, gastric, or duodenal varices 2. small 1-2 mm gastric ulcer. Negative H.pylori   .  ESOPHAGOGASTRODUODENOSCOPY N/A 06/05/2015   SLF: 1. Mild gastritis on ASA and PPI  . PARTIAL HYSTERECTOMY       reports that she has never smoked. She has never used smokeless tobacco. She reports that she does not drink alcohol or use drugs.  No Known Allergies  Family History  Problem Relation Age of Onset  . Heart disease Father   . Heart attack Father   . Aneurysm Mother   . Diabetes Sister   . Hypertension Sister   . Heart disease Sister   . Hypertension Brother   . Heart disease Brother   . Colon cancer Neg Hx     Prior to Admission medications   Medication Sig Start Date End Date Taking? Authorizing Provider  aspirin EC 81 MG tablet Take 81 mg by mouth at bedtime.   Yes [provider]  Cholecalciferol (VITAMIN D3) 5000 units CAPS Take 1 capsule daily by mouth.   Yes [provider]  FLUoxetine (PROZAC) 20 MG capsule Take 20 mg by mouth daily. 12/25/15  Yes [provider]  furosemide (LASIX) 40 MG tablet Take 40 mg by mouth 2 (two) times daily.   Yes [provider]  levothyroxine (SYNTHROID, LEVOTHROID) 75 MCG tablet Take 75 mcg by mouth daily before breakfast.   Yes [provider]  loratadine-pseudoephedrine (CLARITIN-D 24-HOUR) 10-240 MG 24 hr tablet Take 1 tablet by mouth daily as needed for allergies.   Yes [provider]  pantoprazole (PROTONIX) 40 MG tablet Take 1 tablet (40 mg total) by mouth daily. 03/23/15  Yes Annitta Needs, NP  Propylene Glycol (SYSTANE BALANCE) 0.6 % SOLN Apply 1 drop to eye daily as needed. Dry Eyes   Yes [provider]  simvastatin (ZOCOR) 40 MG tablet Take 40 mg by mouth daily.    Yes [provider]  spironolactone (ALDACTONE) 50 MG tablet Take 1 tablet (50 mg total) by mouth daily. WITH LASIX 02/19/17  Yes Fields, Sandi L, MD  furosemide (LASIX) 80 MG tablet Take 1 tablet (80 mg total) by mouth daily. Patient not taking: Reported on 06/20/2017 06/09/17   Hayden Rasmussen, MD  lidocaine-hydrocortisone [facility]) 3-0.5 % CREA Apply to the external hemorrhoid area 4 times daily as needed 06/20/17   Anastasio Auerbach, MD    Physical Exam: Vitals:   06/29/17 2156 06/29/17 2159 06/30/17 0000 06/30/17 0030  BP:   (!) 119/59 (!) 119/54  Pulse:   88 87  Resp: (!) 24  (!) 21 18  Temp:      TempSrc:      SpO2:  96% 96% 99%  Weight:      Height:        Constitutional: NAD, calm, comfortable Vitals:   06/29/17 2156 06/29/17 2159 06/30/17 0000 06/30/17 0030  BP:   (!) 119/59 (!) 119/54  Pulse:   88 87  Resp: (!) 24  (!) 21 18  Temp:      TempSrc:      SpO2:  96% 96% 99%  Weight:      Height:  Eyes: lids and conjunctivae normal ENMT: Mucous membranes are moist.  Neck: normal, supple Respiratory: clear to auscultation bilaterally. Normal respiratory effort. No accessory muscle use.  Cardiovascular: Regular rate and rhythm, no murmurs. LE with nonpitting edema noted bilaterally. Abdomen: no tenderness, mild distention. Bowel sounds positive.  Musculoskeletal:  No joint deformity upper and lower extremities.   Skin: no rashes, lesions, ulcers.  Psychiatric: Normal judgment and insight. Alert and oriented x 3. Normal mood.   Labs on Admission: I have personally reviewed following labs and imaging studies  CBC: Recent Labs  Lab 06/29/17 2254  WBC 5.4  HGB 10.7*  HCT 32.0*  MCV 104.6*  PLT 83*   Basic Metabolic Panel: Recent Labs  Lab 06/29/17 2254  NA 136  K 4.0  CL 102  CO2 25  GLUCOSE 110*  BUN 9  CREATININE 0.76  CALCIUM 9.5   GFR: Estimated Creatinine Clearance: 68 mL/min (by C-G formula based on SCr of 0.76 mg/dL). Liver Function Tests: Recent Labs  Lab 06/29/17 2254  AST 44*  ALT 20  ALKPHOS 159*  BILITOT 1.5*  PROT 5.9*  ALBUMIN 2.4*   No results for input(s): LIPASE, AMYLASE in the last 168 hours. No results for input(s): AMMONIA in the last 168 hours. Coagulation Profile: No results for input(s): INR,  PROTIME in the last 168 hours. Cardiac Enzymes: Recent Labs  Lab 06/29/17 2254  TROPONINI <0.03   BNP (last 3 results) No results for input(s): PROBNP in the last 8760 hours. HbA1C: No results for input(s): HGBA1C in the last 72 hours. CBG: No results for input(s): GLUCAP in the last 168 hours. Lipid Profile: No results for input(s): CHOL, HDL, LDLCALC, TRIG, CHOLHDL, LDLDIRECT in the last 72 hours. Thyroid Function Tests: No results for input(s): TSH, T4TOTAL, FREET4, T3FREE, THYROIDAB in the last 72 hours. Anemia Panel: No results for input(s): VITAMINB12, FOLATE, FERRITIN, TIBC, IRON, RETICCTPCT in the last 72 hours. Urine analysis:    Component Value Date/Time   COLORURINE YELLOW 07/27/2014 1220   APPEARANCEUR CLEAR 07/27/2014 1220   LABSPEC 1.015 07/27/2014 1220   PHURINE 7.5 07/27/2014 1220   GLUCOSEU NEG 07/27/2014 1220   HGBUR NEG 07/27/2014 1220   BILIRUBINUR NEG 07/27/2014 1220   KETONESUR NEG 07/27/2014 1220   PROTEINUR NEG 07/27/2014 1220   UROBILINOGEN 0.2 07/27/2014 1220   NITRITE NEG 07/27/2014 1220   LEUKOCYTESUR NEG 07/27/2014 1220    Radiological Exams on Admission: Dg Chest 2 View  Result Date: 06/29/2017 CLINICAL DATA:  Dyspnea EXAM: CHEST - 2 VIEW COMPARISON:  06/09/2017 chest radiograph. FINDINGS: Low lung volumes. Stable cardiomediastinal silhouette with normal heart size. No pneumothorax. No pleural effusion. No overt pulmonary edema. Mild bibasilar atelectasis. No acute consolidative airspace disease. IMPRESSION: Low lung volumes with mild bibasilar atelectasis. Electronically Signed   By: Ilona Sorrel M.D.   On: 06/29/2017 23:28    EKG: Independently reviewed. SR 93bpm.  Assessment/Plan Principal Problem:   Volume overload Active Problems:   Hyperlipidemia   Thrombocytopenia (HCC)   Splenomegaly   Liver cirrhosis secondary to NASH (HCC)   Hypothyroidism   Diarrhea    1. Progressive volume overload likely secondary to [place] cirrhosis.   Continue diuresis with Lasix 40 mg IV twice a day and continue home spironolactone with fluid restriction of 1200 mL daily as well as low sodium.  Monitor diuresis and increase Lasix dose as needed. Strict I/Os and daily weights. 2. Ambulatory dysfunction secondary to above.  PT evaluation to determine safety on home  discharge or possible need for rehab. 3. Acute diarrhea.  Stool GI panel.  Appears more viral in nature with no recent use of antibiotics. 4. Diastolic CHF.  Actually appears stable with no signs of decompensation at this time.  Prior echo in 2015 and would benefit from reevaluation with repeat echo which I will order. 5. Chronic thrombocytopenia likely secondary to splenomegaly.  Continue to monitor and avoid aspirin as well as heparin products. 6. Hypothyroidism.  Continue home levothyroxine. Check TSH. 7. Dyslipidemia.  Continue statin. 8. GERD.  Protonix.   DVT prophylaxis: SCDs Code Status: Full Family Communication: Son and sister at bedside Disposition Plan:Lasix IV diuresis to euvolemia with diarrhea evaluation, 2d echo Consults called:None Admission status: Obs, tele   Pratik Darleen Crocker DO [facility] Pager [phone]  If 7PM-7AM, please contact night-coverage [email or web address] Password [facility]  06/30/2017, 12:37 AM

## 2017-06-30 NOTE — Care Management Obs Status (Signed)
[place] NOTIFICATION   Patient Details  Name: Melanie Porter MRN: [id number] Date of Birth: [birth year]   Medicare Observation Status Notification Given:  Yes    Sherald Barge, RN 06/30/2017, 1:35 PM

## 2017-06-30 NOTE — Progress Notes (Signed)
*  PRELIMINARY RESULTS* Echocardiogram 2D Echocardiogram has been performed.  Melanie Porter 06/30/2017, 10:24 AM

## 2017-06-30 NOTE — Progress Notes (Addendum)
PROGRESS NOTE  Melanie Porter HWE:[id number] DOB: April 30, 1940 DOA: 06/29/2017 PCP: Sharilyn Sites, MD  Brief History:  77 year old female with a history of hyperlipidemia, NASH Cirrhosis, splenomegaly, hypothyroidism presenting with worsening lower extremity edema, increasing abdominal girth, and dyspnea on exertion since February 2019.  She states that her symptoms had worsened in the past week more significantly.  As result, she presented to the emergency department for further evaluation.  The patient was started on furosemide 40 mg p.o. twice daily after emergency department visit on 06/09/2017.  She endorsed compliance with her torsemide, Aldactone, and diet.  She has intermittent abdominal pain, but denied any nausea, vomiting, dysuria, hematuria.  She states she had 2 episodes of loose stools on 06/29/2017, but has not had a bowel movement since admission.  She has been complaining of increasing orthopnea type symptoms.  Assessment/Plan: Decompensated liver cirrhosis with anasarca -Continue IV furosemide and Aldactone -Abdominal ultrasound to assess for ascites and paracentesis -Echocardiogram to clarify if there is a component of decompensated CHF -am BMP  Thrombocytopenia -Secondary to liver cirrhosis -Monitor for signs of bleeding -am CBC  Hypothyroidism -continue Synthroid  Depression/anxiety -Continue fluoxetine  Hyperlipidemia -Continue statin    Disposition Plan:   Home in 2-3 days  Family Communication:  No Family at bedside..Total time spent 31 minutes.  Greater than 50% spent face to face counseling and coordinating care. 0730 to 0801   Consultants:  none  Code Status:  FULL  DVT Prophylaxis:  SCDs   Procedures: As Listed in Progress Note Above  Antibiotics: None    Subjective: Patient denies fevers, chills, headache, chest pain, dyspnea, nausea, vomiting, diarrhea, abdominal pain, dysuria, hematuria, hematochezia, and  melena.   Objective: Vitals:   06/30/17 0100 06/30/17 0145 06/30/17 0200 06/30/17 0609  BP: (!) 119/55  (!) 120/50 (!) 115/48  Pulse: 88  88 85  Resp: (!) 22   20  Temp:   (!) 97.5 F (36.4 C) 98.2 F (36.8 C)  TempSrc:   Oral Oral  SpO2: 96%  100% 94%  Weight:  108 kg (238 lb) 107.4 kg (236 lb 12.4 oz) 107.6 kg (237 lb 3.4 oz)  Height:   5\' 2"  (1.575 m)     Intake/Output Summary (Last 24 hours) at 06/30/2017 8938 Last data filed at 06/30/2017 0748 Gross per 24 hour  Intake 440 ml  Output 1550 ml  Net -1110 ml   Weight change:  Exam:   General:  Pt is alert, follows commands appropriately, not in acute distress  HEENT: No icterus, No thrush, No neck mass, [place]/AT  Cardiovascular: RRR, S1/S2, no rubs, no gallops  Respiratory: Diminished breath sounds at the bases.  No wheezing.  Abdomen: Soft/+BS, non tender, non distended, no guarding  Extremities: 2+ LE edema, No lymphangitis, No petechiae, No rashes, no synovitis   Data Reviewed: I have personally reviewed following labs and imaging studies Basic Metabolic Panel: Recent Labs  Lab 06/29/17 2254 06/30/17 0553  NA 136 137  K 4.0 3.7  CL 102 103  CO2 25 27  GLUCOSE 110* 98  BUN 9 9  CREATININE 0.76 0.65  CALCIUM 9.5 9.5   Liver Function Tests: Recent Labs  Lab 06/29/17 2254  AST 44*  ALT 20  ALKPHOS 159*  BILITOT 1.5*  PROT 5.9*  ALBUMIN 2.4*   No results for input(s): LIPASE, AMYLASE in the last 168 hours. No results for input(s): AMMONIA in the last 168 hours.  Coagulation Profile: No results for input(s): INR, PROTIME in the last 168 hours. CBC: Recent Labs  Lab 06/29/17 2254 06/30/17 0553  WBC 5.4 3.9*  HGB 10.7* 10.3*  HCT 32.0* 31.2*  MCV 104.6* 105.8*  PLT 83* 55*   Cardiac Enzymes: Recent Labs  Lab 06/29/17 2254  TROPONINI <0.03   BNP: Invalid input(s): POCBNP CBG: Recent Labs  Lab 06/30/17 0808  GLUCAP 87   HbA1C: No results for input(s): HGBA1C in the last 72  hours. Urine analysis:    Component Value Date/Time   COLORURINE YELLOW 07/27/2014 1220   APPEARANCEUR CLEAR 07/27/2014 1220   LABSPEC 1.015 07/27/2014 1220   PHURINE 7.5 07/27/2014 1220   GLUCOSEU NEG 07/27/2014 1220   HGBUR NEG 07/27/2014 1220   BILIRUBINUR NEG 07/27/2014 1220   KETONESUR NEG 07/27/2014 1220   PROTEINUR NEG 07/27/2014 1220   UROBILINOGEN 0.2 07/27/2014 1220   NITRITE NEG 07/27/2014 1220   LEUKOCYTESUR NEG 07/27/2014 1220   Sepsis Labs: @LABRCNTIP (procalcitonin:4,lacticidven:4) )No results found for this or any previous visit (from the past 240 hour(s)).   Scheduled Meds: . cholecalciferol  5,000 Units Oral Daily  . FLUoxetine  20 mg Oral Daily  . furosemide  40 mg Intravenous Q12H  . levothyroxine  75 mcg Oral QAC breakfast  . lidocaine-hydrocortisone   Rectal BID  . pantoprazole  40 mg Oral Daily  . simvastatin  40 mg Oral Daily  . sodium chloride flush  3 mL Intravenous Q12H  . spironolactone  50 mg Oral Daily   Continuous Infusions: . sodium chloride      Procedures/Studies: Dg Chest 2 View  Result Date: 06/29/2017 CLINICAL DATA:  Dyspnea EXAM: CHEST - 2 VIEW COMPARISON:  06/09/2017 chest radiograph. FINDINGS: Low lung volumes. Stable cardiomediastinal silhouette with normal heart size. No pneumothorax. No pleural effusion. No overt pulmonary edema. Mild bibasilar atelectasis. No acute consolidative airspace disease. IMPRESSION: Low lung volumes with mild bibasilar atelectasis. Electronically Signed   By: Ilona Sorrel M.D.   On: 06/29/2017 23:28   Dg Chest 2 View  Result Date: 06/09/2017 CLINICAL DATA:  77 year old female with shortness breath and fluid retention. Initial encounter. EXAM: CHEST - 2 VIEW COMPARISON:  08/26/2012 chest CT.  06/10/2010 chest x-ray. FINDINGS: Central pulmonary vascular prominence without pulmonary edema. Elevated hemidiaphragms. No segmental consolidation or pneumothorax. Heart size within normal limits. Calcified slightly  tortuous aorta. Left breast calcifications. Chronic T12 compression fracture with anterior wedging similar to prior CT. Interval mild loss of height L1 vertebra. Shoulder joint degenerative changes greater on left. IMPRESSION: Central pulmonary vascular prominence without pulmonary edema. Chronic T12 compression fracture with anterior wedging similar to prior CT. Interval mild loss of height L1 vertebra. Aortic Atherosclerosis (ICD10-I70.0). Electronically Signed   By: Genia Del M.D.   On: 06/09/2017 15:00   US Venous Img Lower Unilateral Left  Result Date: 06/16/2017 CLINICAL DATA:  77 year old female with left lower extremity swelling EXAM: LEFT LOWER EXTREMITY VENOUS DOPPLER ULTRASOUND TECHNIQUE: Gray-scale sonography with graded compression, as well as color Doppler and duplex ultrasound were performed to evaluate the lower extremity deep venous systems from the level of the common femoral vein and including the common femoral, femoral, profunda femoral, popliteal and calf veins including the posterior tibial, peroneal and gastrocnemius veins when visible. The superficial great saphenous vein was also interrogated. Spectral Doppler was utilized to evaluate flow at rest and with distal augmentation maneuvers in the common femoral, femoral and popliteal veins. COMPARISON:  None. FINDINGS: Contralateral Common Femoral Vein:  Respiratory phasicity is normal and symmetric with the symptomatic side. No evidence of thrombus. Normal compressibility. Common Femoral Vein: No evidence of thrombus. Normal compressibility, respiratory phasicity and response to augmentation. Saphenofemoral Junction: No evidence of thrombus. Normal compressibility and flow on color Doppler imaging. Profunda Femoral Vein: No evidence of thrombus. Normal compressibility and flow on color Doppler imaging. Femoral Vein: No evidence of thrombus. Normal compressibility, respiratory phasicity and response to augmentation. Popliteal Vein: No  evidence of thrombus. Normal compressibility, respiratory phasicity and response to augmentation. Calf Veins: No evidence of thrombus. Normal compressibility and flow on color Doppler imaging. Superficial Great Saphenous Vein: No evidence of thrombus. Normal compressibility. Venous Reflux:  None. Other Findings:  None. IMPRESSION: No evidence of deep venous thrombosis. Electronically Signed   By: Jacqulynn Cadet M.D.   On: 06/16/2017 11:47    Orson Eva, DO  [facility] Pager [phone]  If 7PM-7AM, please contact night-coverage [email or web address] Password [email or web address] 06/30/2017, 9:22 AM   LOS: 0 days

## 2017-07-01 DIAGNOSIS — K7031 Alcoholic cirrhosis of liver with ascites: Secondary | ICD-10-CM | POA: Diagnosis not present

## 2017-07-01 DIAGNOSIS — E782 Mixed hyperlipidemia: Secondary | ICD-10-CM

## 2017-07-01 DIAGNOSIS — R601 Generalized edema: Secondary | ICD-10-CM | POA: Diagnosis present

## 2017-07-01 DIAGNOSIS — Z6841 Body Mass Index (BMI) 40.0 and over, adult: Secondary | ICD-10-CM | POA: Diagnosis not present

## 2017-07-01 DIAGNOSIS — K7581 Nonalcoholic steatohepatitis (NASH): Secondary | ICD-10-CM

## 2017-07-01 DIAGNOSIS — F419 Anxiety disorder, unspecified: Secondary | ICD-10-CM | POA: Diagnosis present

## 2017-07-01 DIAGNOSIS — E669 Obesity, unspecified: Secondary | ICD-10-CM | POA: Diagnosis present

## 2017-07-01 DIAGNOSIS — F329 Major depressive disorder, single episode, unspecified: Secondary | ICD-10-CM | POA: Diagnosis present

## 2017-07-01 DIAGNOSIS — R188 Other ascites: Secondary | ICD-10-CM | POA: Diagnosis present

## 2017-07-01 DIAGNOSIS — R161 Splenomegaly, not elsewhere classified: Secondary | ICD-10-CM | POA: Diagnosis present

## 2017-07-01 DIAGNOSIS — D638 Anemia in other chronic diseases classified elsewhere: Secondary | ICD-10-CM | POA: Diagnosis present

## 2017-07-01 DIAGNOSIS — Z7982 Long term (current) use of aspirin: Secondary | ICD-10-CM | POA: Diagnosis not present

## 2017-07-01 DIAGNOSIS — Z79899 Other long term (current) drug therapy: Secondary | ICD-10-CM | POA: Diagnosis not present

## 2017-07-01 DIAGNOSIS — E038 Other specified hypothyroidism: Secondary | ICD-10-CM | POA: Diagnosis not present

## 2017-07-01 DIAGNOSIS — E039 Hypothyroidism, unspecified: Secondary | ICD-10-CM | POA: Diagnosis present

## 2017-07-01 DIAGNOSIS — D6959 Other secondary thrombocytopenia: Secondary | ICD-10-CM | POA: Diagnosis present

## 2017-07-01 DIAGNOSIS — D649 Anemia, unspecified: Secondary | ICD-10-CM | POA: Diagnosis present

## 2017-07-01 DIAGNOSIS — K7460 Unspecified cirrhosis of liver: Secondary | ICD-10-CM | POA: Diagnosis present

## 2017-07-01 DIAGNOSIS — K219 Gastro-esophageal reflux disease without esophagitis: Secondary | ICD-10-CM | POA: Diagnosis present

## 2017-07-01 DIAGNOSIS — D696 Thrombocytopenia, unspecified: Secondary | ICD-10-CM | POA: Diagnosis not present

## 2017-07-01 DIAGNOSIS — M797 Fibromyalgia: Secondary | ICD-10-CM | POA: Diagnosis present

## 2017-07-01 DIAGNOSIS — E7800 Pure hypercholesterolemia, unspecified: Secondary | ICD-10-CM | POA: Diagnosis present

## 2017-07-01 LAB — GASTROINTESTINAL PANEL BY PCR, STOOL (REPLACES STOOL CULTURE)
ADENOVIRUS F40/41: NOT DETECTED
ASTROVIRUS: NOT DETECTED
CRYPTOSPORIDIUM: NOT DETECTED
CYCLOSPORA CAYETANENSIS: NOT DETECTED
Campylobacter species: NOT DETECTED
ENTAMOEBA HISTOLYTICA: NOT DETECTED
ENTEROTOXIGENIC E COLI (ETEC): NOT DETECTED
Enteroaggregative E coli (EAEC): NOT DETECTED
Enteropathogenic E coli (EPEC): NOT DETECTED
Giardia lamblia: NOT DETECTED
Norovirus GI/GII: NOT DETECTED
Plesimonas shigelloides: NOT DETECTED
Rotavirus A: NOT DETECTED
SAPOVIRUS (I, II, IV, AND V): NOT DETECTED
SHIGA LIKE TOXIN PRODUCING E COLI (STEC): NOT DETECTED
Salmonella species: NOT DETECTED
Shigella/Enteroinvasive E coli (EIEC): NOT DETECTED
VIBRIO CHOLERAE: NOT DETECTED
VIBRIO SPECIES: NOT DETECTED
YERSINIA ENTEROCOLITICA: NOT DETECTED

## 2017-07-01 LAB — BASIC METABOLIC PANEL
Anion gap: 10 (ref 5–15)
BUN: 13 mg/dL (ref 6–20)
CALCIUM: 9.1 mg/dL (ref 8.9–10.3)
CO2: 26 mmol/L (ref 22–32)
CREATININE: 0.88 mg/dL (ref 0.44–1.00)
Chloride: 101 mmol/L (ref 101–111)
GFR calc Af Amer: >60 mL/min (ref >60)
Glucose, Bld: 134 mg/dL — ABNORMAL HIGH (ref 65–99)
POTASSIUM: 3.5 mmol/L (ref 3.5–5.1)
SODIUM: 137 mmol/L (ref 135–145)

## 2017-07-01 LAB — CBC
HCT: 29.8 % — ABNORMAL LOW (ref 36.0–46.0)
Hemoglobin: 9.9 g/dL — ABNORMAL LOW (ref 12.0–15.0)
MCH: 35 pg — ABNORMAL HIGH (ref 26.0–34.0)
MCHC: 33.2 g/dL (ref 30.0–36.0)
MCV: 105.3 fL — ABNORMAL HIGH (ref 78.0–100.0)
PLATELETS: 68 10*3/uL — AB (ref 150–400)
RBC: 2.83 MIL/uL — ABNORMAL LOW (ref 3.87–5.11)
RDW: 13.1 % (ref 11.5–15.5)
WBC: 3.8 10*3/uL — AB (ref 4.0–10.5)

## 2017-07-01 LAB — GLUCOSE, CAPILLARY: GLUCOSE-CAPILLARY: 88 mg/dL (ref 65–99)

## 2017-07-01 LAB — PATHOLOGIST SMEAR REVIEW

## 2017-07-01 MED ORDER — IBUPROFEN 800 MG PO TABS
800.0000 mg | ORAL_TABLET | Freq: Once | ORAL | Status: AC
  Administered 2017-07-01: 800 mg via ORAL
  Filled 2017-07-01: qty 1

## 2017-07-01 MED ORDER — POTASSIUM CHLORIDE CRYS ER 20 MEQ PO TBCR
20.0000 meq | EXTENDED_RELEASE_TABLET | Freq: Once | ORAL | Status: AC
  Administered 2017-07-01: 20 meq via ORAL
  Filled 2017-07-01: qty 1

## 2017-07-01 MED ORDER — FUROSEMIDE 40 MG PO TABS
40.0000 mg | ORAL_TABLET | Freq: Two times a day (BID) | ORAL | Status: DC
  Administered 2017-07-01 – 2017-07-02 (×2): 40 mg via ORAL
  Filled 2017-07-01 (×2): qty 1

## 2017-07-01 MED ORDER — ALBUMIN HUMAN 25 % IV SOLN
25.0000 g | Freq: Once | INTRAVENOUS | Status: AC
  Administered 2017-07-01: 25 g via INTRAVENOUS
  Filled 2017-07-01: qty 100

## 2017-07-01 NOTE — Progress Notes (Signed)
Pt has soft BP tonight. MD already made aware, holding spironolactone. Pt asymptomatic. Will continue to monitor patient.

## 2017-07-01 NOTE — Progress Notes (Signed)
Late entry:  Dr. Carles Collet notified of patient's VS via text page.  Patient reports dizziness when standing.

## 2017-07-01 NOTE — Care Management Note (Signed)
Case Management Note  Patient Details  Name: Melanie Porter MRN: [id number] Date of Birth: 1940/12/28  Subjective/Objective:            Admitted with volume overload. Pt from home, lives alone, ind wit ADL;s. Has PCP, drives herself, does her own shopping. She has insurance with drug coverage. She was recommended for [facility] PT.        Action/Plan: Pt not interested in [facility] PT. She says she is not homebound and will be leaving the house at least daily. She feels more mobile now that so much fluid has been removed. She is hesitant but agreeable to OP PT referral just in case she gets home and feels she needs it. She understands she can decline when they call to make an appointment. [facility].   Expected Discharge Date:  07/02/17               Expected Discharge Plan:  Home/Self Care  In-House Referral:  NA  Discharge planning Services  CM Consult  Post Acute Care Choice:  NA Choice offered to:  NA  Status of Service:  Completed, signed off  If discussed at Long Length of Stay Meetings, dates discussed:    Additional Comments:  Sherald Barge, RN 07/01/2017, 1:02 PM

## 2017-07-01 NOTE — Progress Notes (Signed)
Physical Therapy Treatment Patient Details Name: Melanie Porter MRN: [id number] DOB: 02-25-1941 Today's Date: 07/01/2017    History of Present Illness 77 yo female with onset of volume overload along with low lung volumes and anasarca was admitted, has PMHx:  [name] cirrhosis, CHF, old R ankle and back fractures, monoclonal gammopathy,     PT Comments    Pt is unable to get up as BP is quite low and was too lightheaded earlier just prior to PT arrival.  Worked on there ex to increase her BP control and pt was instructed not to try to get up alone.  Her plan is to continue to work on standing and gait as her vitals allow, then to transition home with family and HHPT to follow her.   Follow Up Recommendations  Home health PT;Supervision for mobility/OOB     Equipment Recommendations  None recommended by PT    Recommendations for Other Services       Precautions / Restrictions Precautions Precautions: Fall Restrictions Weight Bearing Restrictions: No    Mobility  Bed Mobility Overal bed mobility: Needs Assistance             General bed mobility comments: pt rolled and scooted up in bed with bedrails with mod I but did not get OOB  Transfers                    Ambulation/Gait                 Stairs            Wheelchair Mobility    Modified Rankin (Stroke Patients Only)       Balance                                            Cognition Arousal/Alertness: Awake/alert Behavior During Therapy: WFL for tasks assessed/performed Overall Cognitive Status: Within Functional Limits for tasks assessed                                        Exercises General Exercises - Lower Extremity Ankle Circles/Pumps: AROM;Both;5 reps Quad Sets: AROM;Both;10 reps Gluteal Sets: AROM;Both;10 reps Heel Slides: AROM;Both;10 reps Hip ABduction/ADduction: AROM;Both;10 reps    General Comments        Pertinent  Vitals/Pain Pain Assessment: No/denies pain    Home Living                      Prior Function            PT Goals (current goals can now be found in the care plan section) Acute Rehab PT Goals Patient Stated Goal: to get stronger and get her endurance back Progress towards PT goals: Progressing toward goals    Frequency    Min 3X/week      PT Plan Current plan remains appropriate    Co-evaluation              AM-PAC PT "6 Clicks" Daily Activity  Outcome Measure  Difficulty turning over in bed (including adjusting bedclothes, sheets and blankets)?: A Little Difficulty moving from lying on back to sitting on the side of the bed? : A Little Difficulty sitting down on and standing up from a chair with arms (e.g.,  wheelchair, bedside commode, etc,.)?: A Little Help needed moving to and from a bed to chair (including a wheelchair)?: A Little Help needed walking in hospital room?: A Little Help needed climbing 3-5 steps with a railing? : A Little 6 Click Score: 18    End of Session   Activity Tolerance: Patient tolerated treatment well Patient left: in bed;with call bell/phone within reach;with bed alarm set Nurse Communication: Mobility status PT Visit Diagnosis: Unsteadiness on feet (R26.81);Repeated falls (R29.6);Muscle weakness (generalized) (M62.81);Difficulty in walking, not elsewhere classified (R26.2)     Time: 6815-9470 PT Time Calculation (min) (ACUTE ONLY): 18 min  Charges:  $Therapeutic Exercise: 8-22 mins                    G Codes:      Ramond Dial 07/26/17, 9:44 PM   9:46 PM, Jul 26, 2017 Mee Hives, PT, MS Physical Therapist - [place] [phone] (Office)

## 2017-07-01 NOTE — Progress Notes (Signed)
PROGRESS NOTE  Melanie Porter GMW:[id number] DOB: 1940-12-05 DOA: 06/29/2017 PCP: Sharilyn Sites, MD   Brief History:  77 year old female with a history of hyperlipidemia, NASH Cirrhosis, splenomegaly, hypothyroidism presenting with worsening lower extremity edema, increasing abdominal girth, and dyspnea on exertion since February 2019.  She states that her symptoms had worsened in the past week more significantly.  As result, she presented to the emergency department for further evaluation.  The patient was started on furosemide 40 mg p.o. twice daily after emergency department visit on 06/09/2017.  She endorsed compliance with her torsemide, Aldactone, and diet.  She has intermittent abdominal pain, but denied any nausea, vomiting, dysuria, hematuria.  She states she had 2 episodes of loose stools on 06/29/2017, but has not had a bowel movement since admission.  She has been complaining of increasing orthopnea type symptoms.  Assessment/Plan: Decompensated liver cirrhosis with anasarca -Continue IV furosemide--NEG 4L -06/30/17--paracentesis-->2.8L removed -transition to po lasix am 4/3 -holding spironolactone due to soft BP -Abdominal ultrasound to assess for ascites and paracentesis -Echocardiogram--EF 65-70%, no WMA, grade 1 DD -doubt decompensated CHF as EF is perserved   Thrombocytopenia -Secondary to liver cirrhosis -Monitor for signs of bleeding -am CBC  Hypothyroidism -continue Synthroid  Depression/anxiety -Continue fluoxetine  Hyperlipidemia -Continue statin    Disposition Plan:   Home 4/3 if stable Family Communication:  Sisters updated at bedside 07/01/17   Consultants:  none  Code Status:  FULL  DVT Prophylaxis:  SCDs   Procedures: As Listed in Progress Note Above  Antibiotics: None      Subjective: Patient is feeling much better.  She feels like she can lift her legs today.  Her abdomen is feeling much better without any  tenderness.  She feels less distended.  She is passing gas but has not a bowel movement.  Denies any fevers, chills, chest pain, shortness breath, nausea, vomiting, diarrhea.  There is no dysuria or hematuria.  Objective: Vitals:   06/30/17 1957 07/01/17 0515 07/01/17 1300 07/01/17 1508  BP: (!) 115/41 (!) 108/52 (!) 94/34 (!) 94/32  Pulse: 84 85 75 74  Resp:   19 20  Temp: 98.8 F (37.1 C) 98.4 F (36.9 C) 97.6 F (36.4 C)   TempSrc: Oral Oral Oral   SpO2: 96% 97% 96% 93%  Weight:  107.8 kg (237 lb 10.5 oz)    Height:        Intake/Output Summary (Last 24 hours) at 07/01/2017 1753 Last data filed at 07/01/2017 1100 Gross per 24 hour  Intake 1080 ml  Output 1900 ml  Net -820 ml   Weight change: 3.019 kg (6 lb 10.5 oz) Exam:   General:  Pt is alert, follows commands appropriately, not in acute distress  HEENT: No icterus, No thrush, No neck mass, [place]/AT  Cardiovascular: RRR, S1/S2, no rubs, no gallops  Respiratory: Bibasilar crackles, left greater than right.  No wheezing.  Good air movement  Abdomen: Soft/+BS, non tender, non distended, no guarding  Extremities: 2+ LE edema, No lymphangitis, No petechiae, No rashes, no synovitis   Data Reviewed: I have personally reviewed following labs and imaging studies Basic Metabolic Panel: Recent Labs  Lab 06/29/17 2254 06/30/17 0553 07/01/17 1031  NA 136 137 137  K 4.0 3.7 3.5  CL 102 103 101  CO2 25 27 26   GLUCOSE 110* 98 134*  BUN 9 9 13   CREATININE 0.76 0.65 0.88  CALCIUM 9.5 9.5 9.1  Liver Function Tests: Recent Labs  Lab 06/29/17 2254  AST 44*  ALT 20  ALKPHOS 159*  BILITOT 1.5*  PROT 5.9*  ALBUMIN 2.4*   No results for input(s): LIPASE, AMYLASE in the last 168 hours. No results for input(s): AMMONIA in the last 168 hours. Coagulation Profile: No results for input(s): INR, PROTIME in the last 168 hours. CBC: Recent Labs  Lab 06/29/17 2254 06/30/17 0553 07/01/17 1031  WBC 5.4 3.9* 3.8*  HGB 10.7*  10.3* 9.9*  HCT 32.0* 31.2* 29.8*  MCV 104.6* 105.8* 105.3*  PLT 83* 55* 68*   Cardiac Enzymes: Recent Labs  Lab 06/29/17 2254  TROPONINI <0.03   BNP: Invalid input(s): POCBNP CBG: Recent Labs  Lab 06/30/17 0808 07/01/17 0806  GLUCAP 87 88   HbA1C: No results for input(s): HGBA1C in the last 72 hours. Urine analysis:    Component Value Date/Time   COLORURINE YELLOW 07/27/2014 1220   APPEARANCEUR CLEAR 07/27/2014 1220   LABSPEC 1.015 07/27/2014 1220   PHURINE 7.5 07/27/2014 1220   GLUCOSEU NEG 07/27/2014 1220   HGBUR NEG 07/27/2014 1220   BILIRUBINUR NEG 07/27/2014 1220   KETONESUR NEG 07/27/2014 1220   PROTEINUR NEG 07/27/2014 1220   UROBILINOGEN 0.2 07/27/2014 1220   NITRITE NEG 07/27/2014 1220   LEUKOCYTESUR NEG 07/27/2014 1220   Sepsis Labs: @LABRCNTIP (procalcitonin:4,lacticidven:4) ) Recent Results (from the past 240 hour(s))  Gastrointestinal Panel by PCR , Stool     Status: None   Collection Time: 06/30/17 12:54 AM  Result Value Ref Range Status   Campylobacter species NOT DETECTED NOT DETECTED Final   Plesimonas shigelloides NOT DETECTED NOT DETECTED Final   Salmonella species NOT DETECTED NOT DETECTED Final   Yersinia enterocolitica NOT DETECTED NOT DETECTED Final   Vibrio species NOT DETECTED NOT DETECTED Final   Vibrio cholerae NOT DETECTED NOT DETECTED Final   Enteroaggregative E coli (EAEC) NOT DETECTED NOT DETECTED Final   Enteropathogenic E coli (EPEC) NOT DETECTED NOT DETECTED Final   Enterotoxigenic E coli (ETEC) NOT DETECTED NOT DETECTED Final   Shiga like toxin producing E coli (STEC) NOT DETECTED NOT DETECTED Final   Shigella/Enteroinvasive E coli (EIEC) NOT DETECTED NOT DETECTED Final   Cryptosporidium NOT DETECTED NOT DETECTED Final   Cyclospora cayetanensis NOT DETECTED NOT DETECTED Final   Entamoeba histolytica NOT DETECTED NOT DETECTED Final   Giardia lamblia NOT DETECTED NOT DETECTED Final   Adenovirus F40/41 NOT DETECTED NOT  DETECTED Final   Astrovirus NOT DETECTED NOT DETECTED Final   Norovirus GI/GII NOT DETECTED NOT DETECTED Final   Rotavirus A NOT DETECTED NOT DETECTED Final   Sapovirus (I, II, IV, and V) NOT DETECTED NOT DETECTED Final    Comment: Performed at [facility], [address] [place], [place] [address]  Culture, body fluid-bottle     Status: None (Preliminary result)   Collection Time: 06/30/17  1:10 PM  Result Value Ref Range Status   Specimen Description ASCITIC  Final   Special Requests BOTTLES DRAWN AEROBIC AND ANAEROBIC 10 CC EACH  Final   Culture   Final    NO GROWTH < 24 HOURS Performed at [facility], [address] [place], [place] [address]    Report Status PENDING  Incomplete  Gram stain     Status: None   Collection Time: 06/30/17  1:10 PM  Result Value Ref Range Status   Specimen Description ASCITIC  Final   Special Requests NONE  Final   Gram Stain   Final  CYTOSPIN SMEAR NO ORGANISMS SEEN WBC PRESENT, PREDOMINANTLY MONONUCLEAR Performed at [facility], [address] [place], [place] [address]    Report Status 06/30/2017 FINAL  Final     Scheduled Meds: . cholecalciferol  5,000 Units Oral Daily  . FLUoxetine  20 mg Oral Daily  . furosemide  40 mg Intravenous Q12H  . hydrocortisone   Rectal BID  . levothyroxine  75 mcg Oral QAC breakfast  . pantoprazole  40 mg Oral Daily  . simvastatin  40 mg Oral Daily  . sodium chloride flush  3 mL Intravenous Q12H   Continuous Infusions: . sodium chloride      Procedures/Studies: Dg Chest 2 View  Result Date: 06/29/2017 CLINICAL DATA:  Dyspnea EXAM: CHEST - 2 VIEW COMPARISON:  06/09/2017 chest radiograph. FINDINGS: Low lung volumes. Stable cardiomediastinal silhouette with normal heart size. No pneumothorax. No pleural effusion. No overt pulmonary edema. Mild bibasilar atelectasis. No acute consolidative airspace disease. IMPRESSION: Low lung volumes with mild bibasilar atelectasis. Electronically Signed    By: Ilona Sorrel M.D.   On: 06/29/2017 23:28   Dg Chest 2 View  Result Date: 06/09/2017 CLINICAL DATA:  77 year old female with shortness breath and fluid retention. Initial encounter. EXAM: CHEST - 2 VIEW COMPARISON:  08/26/2012 chest CT.  06/10/2010 chest x-ray. FINDINGS: Central pulmonary vascular prominence without pulmonary edema. Elevated hemidiaphragms. No segmental consolidation or pneumothorax. Heart size within normal limits. Calcified slightly tortuous aorta. Left breast calcifications. Chronic T12 compression fracture with anterior wedging similar to prior CT. Interval mild loss of height L1 vertebra. Shoulder joint degenerative changes greater on left. IMPRESSION: Central pulmonary vascular prominence without pulmonary edema. Chronic T12 compression fracture with anterior wedging similar to prior CT. Interval mild loss of height L1 vertebra. Aortic Atherosclerosis (ICD10-I70.0). Electronically Signed   By: Genia Del M.D.   On: 06/09/2017 15:00   US Venous Img Lower Unilateral Left  Result Date: 06/16/2017 CLINICAL DATA:  77 year old female with left lower extremity swelling EXAM: LEFT LOWER EXTREMITY VENOUS DOPPLER ULTRASOUND TECHNIQUE: Gray-scale sonography with graded compression, as well as color Doppler and duplex ultrasound were performed to evaluate the lower extremity deep venous systems from the level of the common femoral vein and including the common femoral, femoral, profunda femoral, popliteal and calf veins including the posterior tibial, peroneal and gastrocnemius veins when visible. The superficial great saphenous vein was also interrogated. Spectral Doppler was utilized to evaluate flow at rest and with distal augmentation maneuvers in the common femoral, femoral and popliteal veins. COMPARISON:  None. FINDINGS: Contralateral Common Femoral Vein: Respiratory phasicity is normal and symmetric with the symptomatic side. No evidence of thrombus. Normal compressibility. Common  Femoral Vein: No evidence of thrombus. Normal compressibility, respiratory phasicity and response to augmentation. Saphenofemoral Junction: No evidence of thrombus. Normal compressibility and flow on color Doppler imaging. Profunda Femoral Vein: No evidence of thrombus. Normal compressibility and flow on color Doppler imaging. Femoral Vein: No evidence of thrombus. Normal compressibility, respiratory phasicity and response to augmentation. Popliteal Vein: No evidence of thrombus. Normal compressibility, respiratory phasicity and response to augmentation. Calf Veins: No evidence of thrombus. Normal compressibility and flow on color Doppler imaging. Superficial Great Saphenous Vein: No evidence of thrombus. Normal compressibility. Venous Reflux:  None. Other Findings:  None. IMPRESSION: No evidence of deep venous thrombosis. Electronically Signed   By: Jacqulynn Cadet M.D.   On: 06/16/2017 11:47   US Paracentesis  Result Date: 06/30/2017 INDICATION: Ascites EXAM: ULTRASOUND GUIDED DIAGNOSTIC AND THERAPEUTIC PARACENTESIS MEDICATIONS: None.  COMPLICATIONS: None immediate. PROCEDURE: Procedure, benefits, and risks of procedure were discussed with patient. Written informed consent for procedure was obtained. Time out protocol followed. Adequate collection of ascites localized by ultrasound in RIGHT lower quadrant. Skin prepped and draped in usual sterile fashion. Skin and soft tissues anesthetized with 10 mL of 1% lidocaine. 5 [place] Yueh catheter placed into peritoneal cavity. 2.8 L of clear yellow fluid fluid aspirated by vacuum bottle suction. Procedure tolerated well by patient without immediate complication. FINDINGS: A total of approximately 2.8 L of ascitic fluid was removed. Samples were sent to the laboratory as requested by the clinical team. IMPRESSION: Successful ultrasound-guided paracentesis yielding 2.8 liters of peritoneal fluid. Electronically Signed   By: Lavonia Dana M.D.   On: 06/30/2017 14:13     Orson Eva, DO  [facility] Pager [phone]  If 7PM-7AM, please contact night-coverage [email or web address] Password [email or web address] 07/01/2017, 5:53 PM   LOS: 0 days

## 2017-07-02 DIAGNOSIS — R601 Generalized edema: Secondary | ICD-10-CM

## 2017-07-02 DIAGNOSIS — K7031 Alcoholic cirrhosis of liver with ascites: Secondary | ICD-10-CM

## 2017-07-02 LAB — BASIC METABOLIC PANEL
Anion gap: 8 (ref 5–15)
BUN: 14 mg/dL (ref 6–20)
CHLORIDE: 101 mmol/L (ref 101–111)
CO2: 28 mmol/L (ref 22–32)
CREATININE: 0.98 mg/dL (ref 0.44–1.00)
Calcium: 9.1 mg/dL (ref 8.9–10.3)
GFR calc Af Amer: >60 mL/min (ref >60)
GFR, EST NON AFRICAN AMERICAN: 55 mL/min — AB (ref >60)
GLUCOSE: 96 mg/dL (ref 65–99)
POTASSIUM: 3.6 mmol/L (ref 3.5–5.1)
SODIUM: 137 mmol/L (ref 135–145)

## 2017-07-02 LAB — CBC
HCT: 31.1 % — ABNORMAL LOW (ref 36.0–46.0)
HEMOGLOBIN: 10.3 g/dL — AB (ref 12.0–15.0)
MCH: 35.2 pg — AB (ref 26.0–34.0)
MCHC: 33.1 g/dL (ref 30.0–36.0)
MCV: 106.1 fL — ABNORMAL HIGH (ref 78.0–100.0)
Platelets: 72 10*3/uL — ABNORMAL LOW (ref 150–400)
RBC: 2.93 MIL/uL — ABNORMAL LOW (ref 3.87–5.11)
RDW: 13.7 % (ref 11.5–15.5)
WBC: 3.8 10*3/uL — ABNORMAL LOW (ref 4.0–10.5)

## 2017-07-02 LAB — MAGNESIUM: Magnesium: 1.4 mg/dL — ABNORMAL LOW (ref 1.7–2.4)

## 2017-07-02 LAB — GLUCOSE, CAPILLARY: Glucose-Capillary: 93 mg/dL (ref 65–99)

## 2017-07-02 MED ORDER — FUROSEMIDE 40 MG PO TABS: 40.0000 mg | ORAL_TABLET | Freq: Two times a day (BID) | ORAL | 2 refills | Status: DC

## 2017-07-02 NOTE — Discharge Summary (Addendum)
Physician Discharge Summary  Melanie Porter ZOX:[id number] DOB: November 08, 1940 DOA: 06/29/2017  PCP: Melanie Sites, MD  Admit date: 06/29/2017 Discharge date: 07/02/2017  Time spent: 45 minutes  Recommendations for Outpatient Follow-up:  -To be discharged home today. -Advised to follow-up with primary care provider in 2 weeks.  Discharge Diagnoses:  Principal Problem:   Volume overload Active Problems:   Hyperlipidemia   Thrombocytopenia (HCC)   Splenomegaly   Liver cirrhosis secondary to NASH (HCC)   Hypothyroidism   Diarrhea   Cirrhosis of liver with ascites (HCC)   Anasarca Anemia of Chronic Disease   Discharge Condition: Stable and improved  Filed Weights   06/30/17 0609 07/01/17 0515 07/02/17 0300  Weight: 107.6 kg (237 lb 3.4 oz) 107.8 kg (237 lb 10.5 oz) 107.8 kg (237 lb 10.5 oz)    History of present illness:  As per Dr. Manuella Porter on 4/1: [name] is a 77 y.o. female with medical history significant for dyslipidemia, hypothyroidism, Nash cirrhosis, splenomegaly with thrombocytopenia, anxiety/depression, and grade 1 diastolic dysfunction on echo 10/2013, who presents with worsening lower extremity edema as well as abdominal swelling that has been ongoing over the last several months which has now led to some shortness of breath despite taking her home diuretics of Lasix and spironolactone.  She additionally started to have some diarrhea yesterday.  Her main concern is not only the swelling that continues to persist, but now her difficulty in ambulation that is associated with the swelling as well as the dyspnea particularly on exertion.  The diarrhea is troublesome because she has difficulty getting to the bathroom and has a hard time cleaning up after herself with her dyspnea and volume overload.  She otherwise lives independently and was actually seen in the ED on 3/11 at which point she was told to start taking Lasix 40 mg twice a day, which it appears that she has been  doing, but is also taking her spironolactone 50 mg twice a day.  She states that she has been careful with watching her fluid intake as well as her sodium intake. She has some mild nausea but denies any vomiting.   Denies any fever, chills, or abdominal pain. She denies any chest pain, orthopnea, or PND.  She simply states that she starts to become dyspneic more so when she walks around.   ED Course: Vital signs are noted to be stable with some initial soft BP readings which have improved.  She is actually on room air with saturations in the high 90th percentile.  Her laboratory data remained stable from prior with noted anemia and thrombocytopenia.  Her creatinine is stable per her usual baseline and she does not appear to have CKD.  BNP is 41 and two-view chest x-ray does not demonstrate any volume overload.  Lasix 40 mg IV has been given in ED.    Hospital Course:   Decompensated liver cirrhosis with anasarca -Has had significant diuresis with IV Lasix while hospitalized. -Had paracentesis on 4/1 with 2.8 L removed. -Has been transitioned to oral Lasix 40 mg twice daily in addition to spironolactone 50 mg daily. -2D echo report reviewed: EF of 65-70% with grade 1 diastolic dysfunction and no wall motion abnormalities.  Thrombocytopenia -Stable, secondary to cirrhosis, no active bleeding while hospitalized.  Hypothyroidism -Continue Synthroid  Hyperlipidemia -Continue statin  Depression/anxiety -Continue fluoxetine, mood has been stable during this hospitalization.  Anemia of Chronic Disease -Due to NASH cirrhosis. -Hb stable around 10-10.5  Procedures:  Echo as above  Consultations:  None  Discharge Instructions  Discharge Instructions    Ambulatory referral to Physical Therapy   Complete by:  As directed    Diet - low sodium heart healthy   Complete by:  As directed    Increase activity slowly   Complete by:  As directed      Allergies as of 07/02/2017   No  Known Allergies     Medication List    TAKE these medications   aspirin EC 81 MG tablet Take 81 mg by mouth at bedtime.   FLUoxetine 20 MG capsule Commonly known as:  PROZAC Take 20 mg by mouth daily.   furosemide 40 MG tablet Commonly known as:  LASIX Take 1 tablet (40 mg total) by mouth 2 (two) times daily. What changed:  Another medication with the same name was removed. Continue taking this medication, and follow the directions you see here.   levothyroxine 75 MCG tablet Commonly known as:  SYNTHROID, LEVOTHROID Take 75 mcg by mouth daily before breakfast.   lidocaine-hydrocortisone 3-0.5 % Crea Commonly known as:  ANAMANTEL HC Apply to the external hemorrhoid area 4 times daily as needed   loratadine-pseudoephedrine 10-240 MG 24 hr tablet Commonly known as:  CLARITIN-D 24-hour Take 1 tablet by mouth daily as needed for allergies.   pantoprazole 40 MG tablet Commonly known as:  PROTONIX Take 1 tablet (40 mg total) by mouth daily.   simvastatin 40 MG tablet Commonly known as:  ZOCOR Take 40 mg by mouth daily.   spironolactone 50 MG tablet Commonly known as:  ALDACTONE Take 1 tablet (50 mg total) by mouth daily. WITH LASIX   SYSTANE BALANCE 0.6 % Soln Generic drug:  Propylene Glycol Apply 1 drop to eye daily as needed. Dry Eyes   Vitamin D3 5000 units Caps Take 1 capsule daily by mouth.      No Known Allergies Follow-up Information    Melanie Sites, MD. Schedule an appointment as soon as possible for a visit in 2 week(s).   Specialty:  Family Medicine Contact information: [address] [place] [address] [phone]            The results of significant diagnostics from this hospitalization (including imaging, microbiology, ancillary and laboratory) are listed below for reference.    Significant Diagnostic Studies: Dg Chest 2 View  Result Date: 06/29/2017 CLINICAL DATA:  Dyspnea EXAM: CHEST - 2 VIEW COMPARISON:  06/09/2017 chest  radiograph. FINDINGS: Low lung volumes. Stable cardiomediastinal silhouette with normal heart size. No pneumothorax. No pleural effusion. No overt pulmonary edema. Mild bibasilar atelectasis. No acute consolidative airspace disease. IMPRESSION: Low lung volumes with mild bibasilar atelectasis. Electronically Signed   By: Ilona Sorrel M.D.   On: 06/29/2017 23:28   Dg Chest 2 View  Result Date: 06/09/2017 CLINICAL DATA:  77 year old female with shortness breath and fluid retention. Initial encounter. EXAM: CHEST - 2 VIEW COMPARISON:  08/26/2012 chest CT.  06/10/2010 chest x-ray. FINDINGS: Central pulmonary vascular prominence without pulmonary edema. Elevated hemidiaphragms. No segmental consolidation or pneumothorax. Heart size within normal limits. Calcified slightly tortuous aorta. Left breast calcifications. Chronic T12 compression fracture with anterior wedging similar to prior CT. Interval mild loss of height L1 vertebra. Shoulder joint degenerative changes greater on left. IMPRESSION: Central pulmonary vascular prominence without pulmonary edema. Chronic T12 compression fracture with anterior wedging similar to prior CT. Interval mild loss of height L1 vertebra. Aortic Atherosclerosis (ICD10-I70.0). Electronically Signed   By: Genia Del  M.D.   On: 06/09/2017 15:00   US Venous Img Lower Unilateral Left  Result Date: 06/16/2017 CLINICAL DATA:  77 year old female with left lower extremity swelling EXAM: LEFT LOWER EXTREMITY VENOUS DOPPLER ULTRASOUND TECHNIQUE: Gray-scale sonography with graded compression, as well as color Doppler and duplex ultrasound were performed to evaluate the lower extremity deep venous systems from the level of the common femoral vein and including the common femoral, femoral, profunda femoral, popliteal and calf veins including the posterior tibial, peroneal and gastrocnemius veins when visible. The superficial great saphenous vein was also interrogated. Spectral Doppler was  utilized to evaluate flow at rest and with distal augmentation maneuvers in the common femoral, femoral and popliteal veins. COMPARISON:  None. FINDINGS: Contralateral Common Femoral Vein: Respiratory phasicity is normal and symmetric with the symptomatic side. No evidence of thrombus. Normal compressibility. Common Femoral Vein: No evidence of thrombus. Normal compressibility, respiratory phasicity and response to augmentation. Saphenofemoral Junction: No evidence of thrombus. Normal compressibility and flow on color Doppler imaging. Profunda Femoral Vein: No evidence of thrombus. Normal compressibility and flow on color Doppler imaging. Femoral Vein: No evidence of thrombus. Normal compressibility, respiratory phasicity and response to augmentation. Popliteal Vein: No evidence of thrombus. Normal compressibility, respiratory phasicity and response to augmentation. Calf Veins: No evidence of thrombus. Normal compressibility and flow on color Doppler imaging. Superficial Great Saphenous Vein: No evidence of thrombus. Normal compressibility. Venous Reflux:  None. Other Findings:  None. IMPRESSION: No evidence of deep venous thrombosis. Electronically Signed   By: Jacqulynn Cadet M.D.   On: 06/16/2017 11:47   US Paracentesis  Result Date: 06/30/2017 INDICATION: Ascites EXAM: ULTRASOUND GUIDED DIAGNOSTIC AND THERAPEUTIC PARACENTESIS MEDICATIONS: None. COMPLICATIONS: None immediate. PROCEDURE: Procedure, benefits, and risks of procedure were discussed with patient. Written informed consent for procedure was obtained. Time out protocol followed. Adequate collection of ascites localized by ultrasound in RIGHT lower quadrant. Skin prepped and draped in usual sterile fashion. Skin and soft tissues anesthetized with 10 mL of 1% lidocaine. 5 [place] Yueh catheter placed into peritoneal cavity. 2.8 L of clear yellow fluid fluid aspirated by vacuum bottle suction. Procedure tolerated well by patient without immediate  complication. FINDINGS: A total of approximately 2.8 L of ascitic fluid was removed. Samples were sent to the laboratory as requested by the clinical team. IMPRESSION: Successful ultrasound-guided paracentesis yielding 2.8 liters of peritoneal fluid. Electronically Signed   By: Lavonia Dana M.D.   On: 06/30/2017 14:13    Microbiology: Recent Results (from the past 240 hour(s))  Gastrointestinal Panel by PCR , Stool     Status: None   Collection Time: 06/30/17 12:54 AM  Result Value Ref Range Status   Campylobacter species NOT DETECTED NOT DETECTED Final   Plesimonas shigelloides NOT DETECTED NOT DETECTED Final   Salmonella species NOT DETECTED NOT DETECTED Final   Yersinia enterocolitica NOT DETECTED NOT DETECTED Final   Vibrio species NOT DETECTED NOT DETECTED Final   Vibrio cholerae NOT DETECTED NOT DETECTED Final   Enteroaggregative E coli (EAEC) NOT DETECTED NOT DETECTED Final   Enteropathogenic E coli (EPEC) NOT DETECTED NOT DETECTED Final   Enterotoxigenic E coli (ETEC) NOT DETECTED NOT DETECTED Final   Shiga like toxin producing E coli (STEC) NOT DETECTED NOT DETECTED Final   Shigella/Enteroinvasive E coli (EIEC) NOT DETECTED NOT DETECTED Final   Cryptosporidium NOT DETECTED NOT DETECTED Final   Cyclospora cayetanensis NOT DETECTED NOT DETECTED Final   Entamoeba histolytica NOT DETECTED NOT DETECTED Final   Giardia lamblia NOT  DETECTED NOT DETECTED Final   Adenovirus F40/41 NOT DETECTED NOT DETECTED Final   Astrovirus NOT DETECTED NOT DETECTED Final   Norovirus GI/GII NOT DETECTED NOT DETECTED Final   Rotavirus A NOT DETECTED NOT DETECTED Final   Sapovirus (I, II, IV, and V) NOT DETECTED NOT DETECTED Final    Comment: Performed at [facility], [address] [place], [place] [address]  Culture, body fluid-bottle     Status: None (Preliminary result)   Collection Time: 06/30/17  1:10 PM  Result Value Ref Range Status   Specimen Description ASCITIC  Final   Special  Requests BOTTLES DRAWN AEROBIC AND ANAEROBIC 10 CC EACH  Final   Culture   Final    NO GROWTH 2 DAYS Performed at [facility], [address], [place], [place] [address]    Report Status PENDING  Incomplete  Gram stain     Status: None   Collection Time: 06/30/17  1:10 PM  Result Value Ref Range Status   Specimen Description ASCITIC  Final   Special Requests NONE  Final   Gram Stain   Final    CYTOSPIN SMEAR NO ORGANISMS SEEN WBC PRESENT, PREDOMINANTLY MONONUCLEAR Performed at [facility], [address], [place], [place] [address]    Report Status 06/30/2017 FINAL  Final     Labs: Basic Metabolic Panel: Recent Labs  Lab 06/29/17 2254 06/30/17 0553 07/01/17 1031 07/02/17 0608  NA 136 137 137 137  K 4.0 3.7 3.5 3.6  CL 102 103 101 101  CO2 25 27 26 28   GLUCOSE 110* 98 134* 96  BUN 9 9 13 14   CREATININE 0.76 0.65 0.88 0.98  CALCIUM 9.5 9.5 9.1 9.1  MG  --   --   --  1.4*   Liver Function Tests: Recent Labs  Lab 06/29/17 2254  AST 44*  ALT 20  ALKPHOS 159*  BILITOT 1.5*  PROT 5.9*  ALBUMIN 2.4*   No results for input(s): LIPASE, AMYLASE in the last 168 hours. No results for input(s): AMMONIA in the last 168 hours. CBC: Recent Labs  Lab 06/29/17 2254 06/30/17 0553 07/01/17 1031 07/02/17 0608  WBC 5.4 3.9* 3.8* 3.8*  HGB 10.7* 10.3* 9.9* 10.3*  HCT 32.0* 31.2* 29.8* 31.1*  MCV 104.6* 105.8* 105.3* 106.1*  PLT 83* 55* 68* 72*   Cardiac Enzymes: Recent Labs  Lab 06/29/17 2254  TROPONINI <0.03   BNP: BNP (last 3 results) Recent Labs    06/09/17 1538 06/29/17 2254  BNP 38.0 41.0    ProBNP (last 3 results) No results for input(s): PROBNP in the last 8760 hours.  CBG: Recent Labs  Lab 06/30/17 0808 07/01/17 0806 07/02/17 0726  GLUCAP 87 88 93       Signed:  [facility] Pager: [phone] 07/02/2017, 1:22 PM

## 2017-07-02 NOTE — Care Management Important Message (Signed)
Important Message  Patient Details  Name: Melanie Porter MRN: [id number] Date of Birth: 22-Feb-1941   Medicare Important Message Given:  Yes    Shelda Altes 07/02/2017, 11:25 AM

## 2017-07-02 NOTE — Progress Notes (Signed)
Actually done this patients vitals at 04:37 forgot to insert a new column

## 2017-07-02 NOTE — Progress Notes (Signed)
Patient is to be discharged home and in stable condition. Patient's IV and telemetry removed, WNL. Patient given discharge instructions with family at bedside and verbalized understanding. All questions addressed and answered. Patient will be escorted out by staff via wheelchair when ready.  Celestia Khat, RN

## 2017-07-05 LAB — CULTURE, BODY FLUID W GRAM STAIN -BOTTLE: Culture: NO GROWTH

## 2017-07-07 ENCOUNTER — Other Ambulatory Visit: Payer: Self-pay

## 2017-07-07 NOTE — Patient Outreach (Signed)
[place] [facility]) Care Management  07/07/2017  Melanie Porter 1940-04-07 [id number]     EMMI- General Discharge RED ON EMMI ALERT Day # 1 Date: 07/05/17 Red Alert Reason: " Know who to call about changes in condition? No Scheduled follow-up? No"   Outreach attempt # 1 to patient. Spoke with patient. Reviewed and addressed red alerts. Patient aware to contact PCP for any changes in conditions. She has PCP info and voices she has follow up appt on Thursday. She has transportation to appt. She denies any questions or concerns regarding her meds. No further RN CM needs or concerns at this time.  Advised patient that they would get one more automated EMMI-GENERAL post discharge calls to assess how they are doing following recent hospitalization and will receive a call from a nurse if any of their responses were abnormal. Patient voiced understanding and was appreciative of f/u call.       Plan: RN CM close at this time as no further interventions needed.    Enzo Montgomery, RN,BSN,CCM [place] Management Telephonic Care Management Coordinator Direct Phone: [phone] Toll Free: [phone] Fax: [phone]

## 2017-07-09 ENCOUNTER — Other Ambulatory Visit: Payer: Self-pay

## 2017-07-09 DIAGNOSIS — R601 Generalized edema: Secondary | ICD-10-CM | POA: Diagnosis not present

## 2017-07-09 DIAGNOSIS — K760 Fatty (change of) liver, not elsewhere classified: Secondary | ICD-10-CM | POA: Diagnosis not present

## 2017-07-09 DIAGNOSIS — E876 Hypokalemia: Secondary | ICD-10-CM | POA: Diagnosis not present

## 2017-07-09 DIAGNOSIS — D696 Thrombocytopenia, unspecified: Secondary | ICD-10-CM | POA: Diagnosis not present

## 2017-07-09 DIAGNOSIS — E039 Hypothyroidism, unspecified: Secondary | ICD-10-CM | POA: Diagnosis not present

## 2017-07-09 DIAGNOSIS — Z6841 Body Mass Index (BMI) 40.0 and over, adult: Secondary | ICD-10-CM | POA: Diagnosis not present

## 2017-07-09 DIAGNOSIS — R161 Splenomegaly, not elsewhere classified: Secondary | ICD-10-CM | POA: Diagnosis not present

## 2017-07-09 DIAGNOSIS — E8770 Fluid overload, unspecified: Secondary | ICD-10-CM | POA: Diagnosis not present

## 2017-07-09 NOTE — Patient Outreach (Signed)
[place] [facility]) Care Management  07/09/2017  Melanie Porter 11-27-1940 [id number]     EMMI-General Discharge RED ON EMMI ALERT Day # 4 Date: 07/08/17 Red Alert Reason: ""Lost interest in things? Yes   Sad/hopeless/anxious/empty? Yes"     Outreach attempt # 1 to patient. No answer at present.         Plan: RN CM will make outreach attempt to patient within one business day.   Enzo Montgomery, RN,BSN,CCM [place] Management Telephonic Care Management Coordinator Direct Phone: [phone] Toll Free: [phone] Fax: [phone]

## 2017-07-10 ENCOUNTER — Other Ambulatory Visit: Payer: Self-pay

## 2017-07-10 NOTE — Patient Outreach (Signed)
[place] [facility]) Care Management  07/10/2017  Melanie Porter 1940-07-07 [id number]   EMMI-General Discharge RED ON EMMI ALERT Day # 4 Date: 07/08/17 Red Alert Reason: ""Lost interest in things? Yes   Sad/hopeless/anxious/empty? Yes"    Outreach attempt #2 to patient.Spoke with patient who voices she is doing okay. She denies symptoms recorded on automated call. She voices that she is just weak and tried. She reports that she had two MD appts yesterday and she thinks she may have overdone it. She plans to spend today resting. She denies any other symptoms, needs or concerns at this time. She has completed post discharge automated calls.     Plan: RN CM will close case as no further interventions needed at this time.   Enzo Montgomery, RN,BSN,CCM [place] Management Telephonic Care Management Coordinator Direct Phone: [phone] Toll Free: [phone] Fax: [phone]

## 2017-07-23 ENCOUNTER — Inpatient Hospital Stay (HOSPITAL_COMMUNITY)
Admission: EM | Admit: 2017-07-23 | Discharge: 2017-07-29 | DRG: 433 | Disposition: A | Discharge status: Home Health | Payer: PPO | Attending: Internal Medicine | Admitting: Internal Medicine

## 2017-07-23 ENCOUNTER — Emergency Department (HOSPITAL_COMMUNITY): Payer: PPO

## 2017-07-23 ENCOUNTER — Ambulatory Visit (INDEPENDENT_AMBULATORY_CARE_PROVIDER_SITE_OTHER): Payer: PPO | Admitting: Nurse Practitioner

## 2017-07-23 ENCOUNTER — Encounter (HOSPITAL_COMMUNITY): Payer: Self-pay | Admitting: Emergency Medicine

## 2017-07-23 ENCOUNTER — Other Ambulatory Visit: Payer: Self-pay

## 2017-07-23 ENCOUNTER — Encounter: Payer: Self-pay | Admitting: Nurse Practitioner

## 2017-07-23 VITALS — BP 114/64 | HR 87 | Temp 97.2°F | Ht 62.0 in | Wt 230.2 lb

## 2017-07-23 DIAGNOSIS — E039 Hypothyroidism, unspecified: Secondary | ICD-10-CM | POA: Diagnosis not present

## 2017-07-23 DIAGNOSIS — E871 Hypo-osmolality and hyponatremia: Secondary | ICD-10-CM | POA: Diagnosis present

## 2017-07-23 DIAGNOSIS — K7460 Unspecified cirrhosis of liver: Secondary | ICD-10-CM

## 2017-07-23 DIAGNOSIS — K7469 Other cirrhosis of liver: Secondary | ICD-10-CM | POA: Diagnosis not present

## 2017-07-23 DIAGNOSIS — Z833 Family history of diabetes mellitus: Secondary | ICD-10-CM | POA: Diagnosis not present

## 2017-07-23 DIAGNOSIS — K7581 Nonalcoholic steatohepatitis (NASH): Secondary | ICD-10-CM | POA: Diagnosis present

## 2017-07-23 DIAGNOSIS — D638 Anemia in other chronic diseases classified elsewhere: Secondary | ICD-10-CM | POA: Diagnosis not present

## 2017-07-23 DIAGNOSIS — Z7982 Long term (current) use of aspirin: Secondary | ICD-10-CM | POA: Diagnosis not present

## 2017-07-23 DIAGNOSIS — K921 Melena: Secondary | ICD-10-CM | POA: Diagnosis not present

## 2017-07-23 DIAGNOSIS — F419 Anxiety disorder, unspecified: Secondary | ICD-10-CM | POA: Diagnosis present

## 2017-07-23 DIAGNOSIS — R079 Chest pain, unspecified: Secondary | ICD-10-CM | POA: Diagnosis not present

## 2017-07-23 DIAGNOSIS — I8510 Secondary esophageal varices without bleeding: Secondary | ICD-10-CM | POA: Diagnosis present

## 2017-07-23 DIAGNOSIS — Z8249 Family history of ischemic heart disease and other diseases of the circulatory system: Secondary | ICD-10-CM | POA: Diagnosis not present

## 2017-07-23 DIAGNOSIS — E876 Hypokalemia: Secondary | ICD-10-CM | POA: Diagnosis present

## 2017-07-23 DIAGNOSIS — R110 Nausea: Secondary | ICD-10-CM | POA: Diagnosis not present

## 2017-07-23 DIAGNOSIS — R188 Other ascites: Secondary | ICD-10-CM | POA: Diagnosis not present

## 2017-07-23 DIAGNOSIS — K766 Portal hypertension: Secondary | ICD-10-CM | POA: Diagnosis present

## 2017-07-23 DIAGNOSIS — R0602 Shortness of breath: Secondary | ICD-10-CM | POA: Diagnosis not present

## 2017-07-23 DIAGNOSIS — F329 Major depressive disorder, single episode, unspecified: Secondary | ICD-10-CM | POA: Diagnosis present

## 2017-07-23 DIAGNOSIS — D6959 Other secondary thrombocytopenia: Secondary | ICD-10-CM | POA: Diagnosis not present

## 2017-07-23 DIAGNOSIS — R935 Abnormal findings on diagnostic imaging of other abdominal regions, including retroperitoneum: Secondary | ICD-10-CM | POA: Diagnosis not present

## 2017-07-23 DIAGNOSIS — E785 Hyperlipidemia, unspecified: Secondary | ICD-10-CM | POA: Diagnosis not present

## 2017-07-23 DIAGNOSIS — R630 Anorexia: Secondary | ICD-10-CM

## 2017-07-23 DIAGNOSIS — R601 Generalized edema: Secondary | ICD-10-CM | POA: Diagnosis not present

## 2017-07-23 DIAGNOSIS — K219 Gastro-esophageal reflux disease without esophagitis: Secondary | ICD-10-CM | POA: Diagnosis not present

## 2017-07-23 DIAGNOSIS — R161 Splenomegaly, not elsewhere classified: Secondary | ICD-10-CM | POA: Diagnosis present

## 2017-07-23 DIAGNOSIS — R195 Other fecal abnormalities: Secondary | ICD-10-CM

## 2017-07-23 DIAGNOSIS — E038 Other specified hypothyroidism: Secondary | ICD-10-CM | POA: Diagnosis not present

## 2017-07-23 DIAGNOSIS — D696 Thrombocytopenia, unspecified: Secondary | ICD-10-CM | POA: Diagnosis present

## 2017-07-23 DIAGNOSIS — E782 Mixed hyperlipidemia: Secondary | ICD-10-CM | POA: Diagnosis not present

## 2017-07-23 LAB — COMPREHENSIVE METABOLIC PANEL
ALBUMIN: 2.4 g/dL — AB (ref 3.5–5.0)
ALT: 24 U/L (ref 14–54)
AST: 50 U/L — AB (ref 15–41)
Alkaline Phosphatase: 140 U/L — ABNORMAL HIGH (ref 38–126)
Anion gap: 7 (ref 5–15)
BUN: 13 mg/dL (ref 6–20)
CHLORIDE: 103 mmol/L (ref 101–111)
CO2: 24 mmol/L (ref 22–32)
CREATININE: 0.81 mg/dL (ref 0.44–1.00)
Calcium: 9.2 mg/dL (ref 8.9–10.3)
GFR calc Af Amer: >60 mL/min (ref >60)
GLUCOSE: 99 mg/dL (ref 65–99)
POTASSIUM: 3.9 mmol/L (ref 3.5–5.1)
Sodium: 134 mmol/L — ABNORMAL LOW (ref 135–145)
Total Bilirubin: 1.8 mg/dL — ABNORMAL HIGH (ref 0.3–1.2)
Total Protein: 6 g/dL — ABNORMAL LOW (ref 6.5–8.1)

## 2017-07-23 LAB — CBC
HEMATOCRIT: 32.5 % — AB (ref 36.0–46.0)
Hemoglobin: 10.8 g/dL — ABNORMAL LOW (ref 12.0–15.0)
MCH: 35 pg — AB (ref 26.0–34.0)
MCHC: 33.2 g/dL (ref 30.0–36.0)
MCV: 105.2 fL — ABNORMAL HIGH (ref 78.0–100.0)
RBC: 3.09 MIL/uL — ABNORMAL LOW (ref 3.87–5.11)
RDW: 13.6 % (ref 11.5–15.5)
WBC: 5.8 10*3/uL (ref 4.0–10.5)

## 2017-07-23 LAB — URINALYSIS, ROUTINE W REFLEX MICROSCOPIC
BILIRUBIN URINE: NEGATIVE
GLUCOSE, UA: NEGATIVE mg/dL
HGB URINE DIPSTICK: NEGATIVE
Ketones, ur: NEGATIVE mg/dL
LEUKOCYTES UA: NEGATIVE
Nitrite: NEGATIVE
PH: 6 (ref 5.0–8.0)
Protein, ur: NEGATIVE mg/dL
Specific Gravity, Urine: 1.016 (ref 1.005–1.030)

## 2017-07-23 LAB — POC OCCULT BLOOD, ED: Fecal Occult Bld: POSITIVE — AB

## 2017-07-23 LAB — LIPASE, BLOOD: LIPASE: 25 U/L (ref 11–51)

## 2017-07-23 LAB — TYPE AND SCREEN
ABO/RH(D): O POS
Antibody Screen: NEGATIVE

## 2017-07-23 MED ORDER — SODIUM CHLORIDE 0.9% FLUSH: 3.0000 mL | INTRAVENOUS | Status: DC | PRN

## 2017-07-23 MED ORDER — FUROSEMIDE 10 MG/ML IJ SOLN
40.0000 mg | Freq: Two times a day (BID) | INTRAMUSCULAR | Status: DC
  Filled 2017-07-23: qty 4

## 2017-07-23 MED ORDER — VITAMIN D 1000 UNITS PO TABS
5000.0000 [IU] | ORAL_TABLET | Freq: Every day | ORAL | Status: DC
  Administered 2017-07-24 – 2017-07-29 (×6): 5000 [IU] via ORAL
  Filled 2017-07-23 (×7): qty 5

## 2017-07-23 MED ORDER — SODIUM CHLORIDE 0.9% FLUSH
3.0000 mL | Freq: Two times a day (BID) | INTRAVENOUS | Status: DC
  Administered 2017-07-23 – 2017-07-29 (×10): 3 mL via INTRAVENOUS

## 2017-07-23 MED ORDER — ONDANSETRON HCL 4 MG/2ML IJ SOLN
4.0000 mg | Freq: Four times a day (QID) | INTRAMUSCULAR | Status: DC | PRN
  Administered 2017-07-24: 4 mg via INTRAVENOUS
  Filled 2017-07-23: qty 2

## 2017-07-23 MED ORDER — SIMVASTATIN 20 MG PO TABS
40.0000 mg | ORAL_TABLET | Freq: Every day | ORAL | Status: DC
  Administered 2017-07-24 – 2017-07-28 (×5): 40 mg via ORAL
  Filled 2017-07-23 (×6): qty 2

## 2017-07-23 MED ORDER — ONDANSETRON HCL 4 MG PO TABS: 4.0000 mg | ORAL_TABLET | Freq: Four times a day (QID) | ORAL | Status: DC | PRN

## 2017-07-23 MED ORDER — ASPIRIN EC 81 MG PO TBEC
81.0000 mg | DELAYED_RELEASE_TABLET | Freq: Every day | ORAL | Status: DC
  Administered 2017-07-23 – 2017-07-28 (×6): 81 mg via ORAL
  Filled 2017-07-23 (×6): qty 1

## 2017-07-23 MED ORDER — FUROSEMIDE 10 MG/ML IJ SOLN
40.0000 mg | Freq: Two times a day (BID) | INTRAMUSCULAR | Status: DC
  Administered 2017-07-23 – 2017-07-26 (×6): 40 mg via INTRAVENOUS
  Filled 2017-07-23 (×6): qty 4

## 2017-07-23 MED ORDER — LIDOCAINE-HYDROCORTISONE ACE 3-0.5 % RE CREA
TOPICAL_CREAM | Freq: Two times a day (BID) | RECTAL | Status: DC
  Filled 2017-07-23: qty 7

## 2017-07-23 MED ORDER — LEVOTHYROXINE SODIUM 75 MCG PO TABS
75.0000 ug | ORAL_TABLET | Freq: Every day | ORAL | Status: DC
  Administered 2017-07-24 – 2017-07-29 (×6): 75 ug via ORAL
  Filled 2017-07-23 (×6): qty 1

## 2017-07-23 MED ORDER — POTASSIUM CHLORIDE CRYS ER 20 MEQ PO TBCR
20.0000 meq | EXTENDED_RELEASE_TABLET | Freq: Every day | ORAL | Status: DC
  Administered 2017-07-24 – 2017-07-29 (×6): 20 meq via ORAL
  Filled 2017-07-23 (×8): qty 1

## 2017-07-23 MED ORDER — PANTOPRAZOLE SODIUM 40 MG PO TBEC
40.0000 mg | DELAYED_RELEASE_TABLET | Freq: Every day | ORAL | Status: DC
  Administered 2017-07-24 – 2017-07-29 (×6): 40 mg via ORAL
  Filled 2017-07-23 (×7): qty 1

## 2017-07-23 MED ORDER — SPIRONOLACTONE 25 MG PO TABS
50.0000 mg | ORAL_TABLET | Freq: Every day | ORAL | Status: DC
  Filled 2017-07-23 (×2): qty 2

## 2017-07-23 MED ORDER — FLUOXETINE HCL 20 MG PO CAPS
20.0000 mg | ORAL_CAPSULE | Freq: Every day | ORAL | Status: DC
  Administered 2017-07-24 – 2017-07-29 (×6): 20 mg via ORAL
  Filled 2017-07-23 (×7): qty 1

## 2017-07-23 MED ORDER — SODIUM CHLORIDE 0.9 % IV SOLN: 250.0000 mL | INTRAVENOUS | Status: DC | PRN

## 2017-07-23 NOTE — H&P (Signed)
History and Physical    Melanie Porter YNW:[id number] DOB: January 26, 1941 DOA: 07/23/2017  PCP: Assunta Found, MD   Patient coming from: Home  Chief Complaint: Worsening abdominal distention  HPI: Melanie Porter is a 77 y.o. female with medical history significant for NASH cirrhosis with ascites and recent paracentesis on 06/30/2017 with removal of 2.8 L of fluid who was seen in  at Dr. Darrick Penna office by Wynne Dust NP today for complaints of abdominal pain as well as some nausea and decreased oral intake.  Patient was noted to have anasarca and ascites for which she was recommended to come to the emergency department for further evaluation and paracentesis.  She has also had some dark stools recently and was recommended to have EGD in the outpatient setting after paracentesis during her visit and GI earlier today.  She states that she has been taking her home Lasix and spironolactone is otherwise recommended and has been careful with her fluid intake otherwise. She denies any recent fever, chills, or diarrhea.   ED Course: Vital signs are stable and fecal stool occult is noted to be positive.  Her hemoglobin is stable at 10.8 and she has mild hyponatremia with sodium 134.  Her creatinine is stable at 0.81.  Albumin is 2.4.  Two-view chest x-ray with some cardiomegaly and no other acute findings noted.  Review of Systems: All others reviewed and otherwise negative.  Past Medical History:  Diagnosis Date  . Ankle fracture, right 05/21/10  . Anxiety   . Arthritis   . Back fracture 05/21/10  . Depression   . Fibromyalgia   . GERD (gastroesophageal reflux disease)   . Hypercholesteremia   . 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 Knee fracture, right 05/21/10  . Nonalcoholic steatohepatitis (NASH) BMI > 40   NOV 2013 CHILD PUGH A, MELD SCORE 7. NOV 2017 CHILD PUGH B, MELD 9  . Obesity   . Splenomegaly 09/23/2012  . Thrombocytopenia (HCC) 02/19/2012    Past Surgical  History:  Procedure Laterality Date  . ABDOMINAL HYSTERECTOMY    . BREAST BIOPSY  1982   left benign  . CATARACT EXTRACTION W/PHACO Right 07/11/2015   Procedure: CATARACT EXTRACTION PHACO AND INTRAOCULAR LENS PLACEMENT (IOC);  Surgeon: Jethro Bolus, MD;  Location: [facility];  Service: Ophthalmology;  Laterality: Right;  CDE: 6.00  . CATARACT EXTRACTION W/PHACO Left 07/25/2015   Procedure: CATARACT EXTRACTION PHACO AND INTRAOCULAR LENS PLACEMENT (IOC);  Surgeon: Jethro Bolus, MD;  Location: [facility];  Service: Ophthalmology;  Laterality: Left;  CDE:8.58  . COLONOSCOPY  June 2010   Dr. Loreta Ave: few scattered sigmoid diverticula, otherwise normal. Screening in 2020   . ESOPHAGOGASTRODUODENOSCOPY  03/02/2012   Dr. Darrick Penna:The mucosa of the esophagus appeared normal Multiple non-bleeding ulcers, ranging between 3-87mm in size were found in the gastric antrum  The duodenal mucosa showed no abnormalities in the bulb and second portion of the duodenum. path negative for H.pylori  . ESOPHAGOGASTRODUODENOSCOPY N/A 07/15/2014   SLF: 1. No esophageal, gastric, or duodenal varices 2. small 1-2 mm gastric ulcer. Negative H.pylori   . ESOPHAGOGASTRODUODENOSCOPY N/A 06/05/2015   SLF: 1. Mild gastritis on ASA and PPI  . PARTIAL HYSTERECTOMY       reports that she has never smoked. She has never used smokeless tobacco. She reports that she does not drink alcohol or use drugs.  No Known Allergies  Family History  Problem Relation Age of Onset  . Heart disease Father   .  Heart attack Father   . Aneurysm Mother   . Diabetes Sister   . Hypertension Sister   . Heart disease Sister   . Hypertension Brother   . Heart disease Brother   . Colon cancer Neg Hx     Prior to Admission medications   Medication Sig Start Date End Date Taking? Authorizing Provider  aspirin EC 81 MG tablet Take 81 mg by mouth at bedtime.   Yes [provider]  Cholecalciferol (VITAMIN D3) 5000 units CAPS Take 1 capsule daily by mouth.    Yes [provider]  FLUoxetine (PROZAC) 20 MG capsule Take 20 mg by mouth daily. 12/25/15  Yes [provider]  furosemide (LASIX) 40 MG tablet Take 1 tablet (40 mg total) by mouth 2 (two) times daily. 07/02/17  Yes Henderson Cloud, MD  KLOR-CON M20 20 MEQ tablet Take 20 mEq by mouth daily. 07/06/17  Yes [provider]  levothyroxine (SYNTHROID, LEVOTHROID) 75 MCG tablet Take 75 mcg by mouth daily before breakfast.   Yes [provider]  lidocaine-hydrocortisone (ANAMANTEL HC) 3-0.5 % CREA Apply to the external hemorrhoid area 4 times daily as needed 06/20/17  Yes Fontaine, Nadyne Coombes, MD  loratadine-pseudoephedrine (CLARITIN-D 24-HOUR) 10-240 MG 24 hr tablet Take 1 tablet by mouth daily as needed for allergies.   Yes [provider]  pantoprazole (PROTONIX) 40 MG tablet Take 1 tablet (40 mg total) by mouth daily. 03/23/15  Yes Gelene Mink, NP  Propylene Glycol (SYSTANE BALANCE) 0.6 % SOLN Apply 1 drop to eye daily as needed. Dry Eyes   Yes [provider]  simvastatin (ZOCOR) 40 MG tablet Take 40 mg by mouth daily.    Yes [provider]  spironolactone (ALDACTONE) 50 MG tablet Take 1 tablet (50 mg total) by mouth daily. WITH LASIX 02/19/17  Yes West Bali, MD    Physical Exam: Vitals:   07/23/17 1830 07/23/17 1930 07/23/17 2000 07/23/17 2038  BP: (!) 107/41 (!) 109/44 (!) 109/50 (!) 122/48  Pulse: 75 73 73 76  Resp: 20 (!) 22 (!) 23 18  Temp:    97.9 F (36.6 C)  TempSrc:    Oral  SpO2: 97% 95% 96% 97%  Weight:    103.5 kg (228 lb 2.8 oz)  Height:    5\' 2"  (1.575 m)    Constitutional: NAD, calm, comfortable Vitals:   07/23/17 1830 07/23/17 1930 07/23/17 2000 07/23/17 2038  BP: (!) 107/41 (!) 109/44 (!) 109/50 (!) 122/48  Pulse: 75 73 73 76  Resp: 20 (!) 22 (!) 23 18  Temp:    97.9 F (36.6 C)  TempSrc:    Oral  SpO2: 97% 95% 96% 97%  Weight:    103.5 kg (228 lb 2.8 oz)  Height:    5\' 2"  (1.575 m)    Eyes: lids and conjunctivae normal ENMT: Mucous membranes are moist.  Neck: normal, supple Respiratory: clear to auscultation bilaterally. Normal respiratory effort. No accessory muscle use. Currently laying flat on RA. Cardiovascular: Regular rate and rhythm, no murmurs.  1+ pitting edema to midshin bilaterally.  Stockings in place. Abdomen: Moderate to severe distention noted.  Nontender to palpation. Musculoskeletal:  No joint deformity upper and lower extremities.   Skin: no rashes, lesions, ulcers.  Psychiatric: Normal judgment and insight. Alert and oriented x 3. Normal mood.   Labs on Admission: I have personally reviewed following labs and imaging studies  CBC: Recent Labs  Lab 07/23/17 1515  WBC 5.8  HGB 10.8*  HCT 32.5*  MCV 105.2*  PLT SPECIMEN CHECKED FOR CLOTS   Basic Metabolic Panel: Recent Labs  Lab 07/23/17 1515  NA 134*  K 3.9  CL 103  CO2 24  GLUCOSE 99  BUN 13  CREATININE 0.81  CALCIUM 9.2   GFR: Estimated Creatinine Clearance: 66.7 mL/min (by C-G formula based on SCr of 0.81 mg/dL). Liver Function Tests: Recent Labs  Lab 07/23/17 1515  AST 50*  ALT 24  ALKPHOS 140*  BILITOT 1.8*  PROT 6.0*  ALBUMIN 2.4*   Recent Labs  Lab 07/23/17 1515  LIPASE 25   No results for input(s): AMMONIA in the last 168 hours. Coagulation Profile: No results for input(s): INR, PROTIME in the last 168 hours. Cardiac Enzymes: No results for input(s): CKTOTAL, CKMB, CKMBINDEX, TROPONINI in the last 168 hours. BNP (last 3 results) No results for input(s): PROBNP in the last 8760 hours. HbA1C: No results for input(s): HGBA1C in the last 72 hours. CBG: No results for input(s): GLUCAP in the last 168 hours. Lipid Profile: No results for input(s): CHOL, HDL, LDLCALC, TRIG, CHOLHDL, LDLDIRECT in the last 72 hours. Thyroid Function Tests: No results for input(s): TSH, T4TOTAL, FREET4, T3FREE, THYROIDAB in the last 72 hours. Anemia Panel: No results for  input(s): VITAMINB12, FOLATE, FERRITIN, TIBC, IRON, RETICCTPCT in the last 72 hours. Urine analysis:    Component Value Date/Time   COLORURINE YELLOW 07/23/2017 1509   APPEARANCEUR CLEAR 07/23/2017 1509   LABSPEC 1.016 07/23/2017 1509   PHURINE 6.0 07/23/2017 1509   GLUCOSEU NEGATIVE 07/23/2017 1509   HGBUR NEGATIVE 07/23/2017 1509   BILIRUBINUR NEGATIVE 07/23/2017 1509   KETONESUR NEGATIVE 07/23/2017 1509   PROTEINUR NEGATIVE 07/23/2017 1509   UROBILINOGEN 0.2 07/27/2014 1220   NITRITE NEGATIVE 07/23/2017 1509   LEUKOCYTESUR NEGATIVE 07/23/2017 1509    Radiological Exams on Admission: Dg Chest 2 View  Result Date: 07/23/2017 CLINICAL DATA:  Left-side chest pain and shortness of breath since yesterday. EXAM: CHEST - 2 VIEW COMPARISON:  PA and lateral chest 06/29/2017 and 06/09/2017. CT chest 08/18/2012. FINDINGS: Lung volumes are low as on the prior examinations but the lungs are clear. Heart size is enlarged. No pneumothorax or pleural fluid. Aortic atherosclerosis is noted. IMPRESSION: No acute disease in a low volume chest. Cardiomegaly. Atherosclerosis. Electronically Signed   By: Drusilla Kanner M.D.   On: 07/23/2017 16:58    EKG: Independently reviewed. SR at 81bpm. LAFB.  Assessment/Plan Principal Problem:   Anasarca Active Problems:   Hyperlipidemia   Thrombocytopenia (HCC)   Splenomegaly   Hypothyroidism   Cirrhosis of liver with ascites (HCC)    1. Decompensated liver cirrhosis with anasarca.  Start IV Lasix 40 mg twice daily while hospitalized and continue home spironolactone 50 mg daily.  Maintain on fluid restriction.  Consult to IR for paracentesis in a.m. Maintain n.p.o. after midnight except medications. 2. Portal hypertension with esophageal varices secondary to above. 3. Thrombocytopenia-secondary to cirrhosis.  Reassess in a.m. currently with no active bleeding.  Use SCDs for DVT prophylaxis. 4. Grade 1 diastolic CHF.  Patient appears to be currently  compensated from a cardiopulmonary standpoint despite current volume overload. 5. Hypothyroidism.  Continue Synthroid. 6. Dyslipidemia.  Continue statin. 7. Depression/anxiety.  Continue fluoxetine. 8. Anemia of chronic disease secondary to [facility] cirrhosis.  Hemoglobin currently stable.   DVT prophylaxis: SCDs Code Status: Full Family Communication: None at bedside Disposition Plan:Diuresis and paracentesis Consults called:IR for paracentesis Admission status: Inpatient, med-surg  Litzy Dicker Hoover Brunette DO [facility] Pager [phone]  If 7PM-7AM, please contact night-coverage [email or web address] Password [facility]  07/23/2017, 8:55 PM

## 2017-07-23 NOTE — Assessment & Plan Note (Signed)
Noted nausea without vomiting.  Her abdomen seems distended.  Appears to have worsening fluid collection.  This is likely resulting in nausea and early satiety/decreased appetite.

## 2017-07-23 NOTE — ED Notes (Signed)
Pt on bedpan. edp in with pt and family

## 2017-07-23 NOTE — Assessment & Plan Note (Signed)
The patient notes dark stools intermittently, last episode this morning.  CT found mild varices at the end of 2018.  Last EGD was 2017 with no mention of varices.  Unlikely rapid development of esophageal varices although this is in the differentials.  I feel given her constellation of symptoms to the best served by proceeding to the emergency department for consideration of possible admission for decompensated liver disease, anasarca.  She would likely get abdominal paracentesis, IV diuresis, consider IV albumin pending CMP results, possibly CT exam.

## 2017-07-23 NOTE — ED Provider Notes (Addendum)
[facility] EMERGENCY DEPARTMENT Provider Note   CSN: [id number] Arrival date & time: 07/23/17  1458     History   Chief Complaint Chief Complaint  Patient presents with  . Abdominal Pain    HPI [name] Melanie Porter is a 77 y.o. female.  Patient presents with worsening swelling in her abdomen which she relates to ascites from nonalcoholic cirrhosis.  She went to her gastroenterologist today in [place] and was sent to the emergency department for a paracentesis.  Last paracentesis was 06/30/2017 in any pain hospital where 2.8 L of fluid were removed.  Review of systems positive for left lateral flank pain and questionable black stool today.  No fever, sweats, chills, chest pain, dyspnea.  Severity of symptoms is moderate.     Past Medical History:  Diagnosis Date  . Ankle fracture, right 05/21/10  . Anxiety   . Arthritis   . Back fracture 05/21/10  . Depression   . Fibromyalgia   . GERD (gastroesophageal reflux disease)   . Hypercholesteremia   . 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 Knee fracture, right 05/21/10  . Nonalcoholic steatohepatitis (NASH) BMI > 40   NOV 2013 CHILD PUGH A, MELD SCORE 7. NOV 2017 CHILD PUGH B, MELD 9  . Obesity   . Splenomegaly 09/23/2012  . Thrombocytopenia ([place]) 02/19/2012    Patient Active Problem List   Diagnosis Date Noted  . Abnormal CT of the abdomen 07/23/2017  . Decreased appetite 07/23/2017  . Nausea without vomiting 07/23/2017  . Dark stools 07/23/2017  . Hypothyroidism 06/30/2017  . Diarrhea 06/30/2017  . Volume overload 06/30/2017  . Cirrhosis of liver with ascites ([place]) 06/30/2017  . Anasarca   . Liver cirrhosis secondary to NASH ([place]) 06/23/2015  . Spinal stenosis of lumbar region 01/25/2014  . PUD (peptic ulcer disease) 11/18/2013  . Undiagnosed cardiac murmurs 11/18/2013  . Osteopenia 07/20/2013  . Vaginal atrophy 07/20/2013  . Platelet disorder(CLUMPING) 12/28/2012  . Dermatitis perineal  12/28/2012  . Splenomegaly 09/23/2012  . 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 Non-alcoholic micronodular cirrhosis of liver ([place]) 02/25/2012  . Thrombocytopenia ([place]) 02/19/2012  . Fibromyalgia 02/04/2012  . Hyperlipidemia 02/04/2012  . Arthritis 02/04/2012  . GERD (gastroesophageal reflux disease) 02/04/2012    Past Surgical History:  Procedure Laterality Date  . ABDOMINAL HYSTERECTOMY    . BREAST BIOPSY  1982   left benign  . CATARACT EXTRACTION W/PHACO Right 07/11/2015   Procedure: CATARACT EXTRACTION PHACO AND INTRAOCULAR LENS PLACEMENT (IOC);  Surgeon: Rutherford Guys, MD;  Location: [facility];  Service: Ophthalmology;  Laterality: Right;  CDE: 6.00  . CATARACT EXTRACTION W/PHACO Left 07/25/2015   Procedure: CATARACT EXTRACTION PHACO AND INTRAOCULAR LENS PLACEMENT (IOC);  Surgeon: Rutherford Guys, MD;  Location: [facility];  Service: Ophthalmology;  Laterality: Left;  CDE:8.58  . COLONOSCOPY  June 2010   Dr. Collene Mares: few scattered sigmoid diverticula, otherwise normal. Screening in 2020   . ESOPHAGOGASTRODUODENOSCOPY  03/02/2012   Dr. Oneida Alar:The mucosa of the esophagus appeared normal Multiple non-bleeding ulcers, ranging between 3-40mm in size were found in the gastric antrum  The duodenal mucosa showed no abnormalities in the bulb and second portion of the duodenum. path negative for H.pylori  . ESOPHAGOGASTRODUODENOSCOPY N/A 07/15/2014   SLF: 1. No esophageal, gastric, or duodenal varices 2. small 1-2 mm gastric ulcer. Negative H.pylori   . ESOPHAGOGASTRODUODENOSCOPY N/A 06/05/2015   SLF: 1. Mild gastritis on ASA and PPI  . PARTIAL HYSTERECTOMY  OB History    Gravida  2   Para  2   Term  2   Preterm      AB      Living  2     SAB      TAB      Ectopic      Multiple      Live Births  2            Home Medications    Prior to Admission medications   Medication Sig Start Date End Date Taking? Authorizing Provider  aspirin EC 81 MG  tablet Take 81 mg by mouth at bedtime.    [provider]  Cholecalciferol (VITAMIN D3) 5000 units CAPS Take 1 capsule daily by mouth.    [provider]  FLUoxetine (PROZAC) 20 MG capsule Take 20 mg by mouth daily. 12/25/15   [provider]  furosemide (LASIX) 40 MG tablet Take 1 tablet (40 mg total) by mouth 2 (two) times daily. 07/02/17   Erline Hau, MD  KLOR-CON M20 20 MEQ tablet Take 20 mEq by mouth daily. 07/06/17   [provider]  levothyroxine (SYNTHROID, LEVOTHROID) 75 MCG tablet Take 75 mcg by mouth daily before breakfast.    [provider]  lidocaine-hydrocortisone (ANAMANTEL HC) 3-0.5 % CREA Apply to the external hemorrhoid area 4 times daily as needed 06/20/17   Fontaine, Belinda Block, MD  loratadine-pseudoephedrine (CLARITIN-D 24-HOUR) 10-240 MG 24 hr tablet Take 1 tablet by mouth daily as needed for allergies.    [provider]  pantoprazole (PROTONIX) 40 MG tablet Take 1 tablet (40 mg total) by mouth daily. 03/23/15   Annitta Needs, NP  Propylene Glycol (SYSTANE BALANCE) 0.6 % SOLN Apply 1 drop to eye daily as needed. Dry Eyes    [provider]  simvastatin (ZOCOR) 40 MG tablet Take 40 mg by mouth daily.     [provider]  spironolactone (ALDACTONE) 50 MG tablet Take 1 tablet (50 mg total) by mouth daily. WITH LASIX 02/19/17   Danie Binder, MD    Family History Family History  Problem Relation Age of Onset  . Heart disease Father   . Heart attack Father   . Aneurysm Mother   . Diabetes Sister   . Hypertension Sister   . Heart disease Sister   . Hypertension Brother   . Heart disease Brother   . Colon cancer Neg Hx     Social History Social History   Tobacco Use  . Smoking status: Never Smoker  . Smokeless tobacco: Never Used  . Tobacco comment: Never smoked  Substance Use Topics  . Alcohol use: No    Alcohol/week: 0.0 oz  . Drug use: No     Allergies   Patient has no  known allergies.   Review of Systems Review of Systems  All other systems reviewed and are negative.    Physical Exam Updated Vital Signs BP (!) 106/45   Pulse 75   Temp 98.5 F (36.9 C) (Oral)   Resp 18   Ht 5\' 2"  (1.575 m)   Wt 104.3 kg (230 lb)   SpO2 95%   BMI 42.07 kg/m   Physical Exam  Constitutional: She is oriented to person, place, and time.  Minimal distress.  HENT:  Head: Normocephalic and atraumatic.  Eyes: Conjunctivae are normal.  Neck: Neck supple.  Cardiovascular: Normal rate and regular rhythm.  Pulmonary/Chest: Effort normal and breath sounds normal.  Abdominal: Soft. Bowel sounds are normal.  Ascitic abdomen.  Genitourinary:  Genitourinary Comments: Rectal exam: No masses.  Brown stool.  Lightly heme positive  Musculoskeletal: Normal range of motion.  Neurological: She is alert and oriented to person, place, and time.  Skin: Skin is warm and dry.  Psychiatric: She has a normal mood and affect. Her behavior is normal.  Nursing note and vitals reviewed.    ED Treatments / Results  Labs (all labs ordered are listed, but only abnormal results are displayed) Labs Reviewed  COMPREHENSIVE METABOLIC PANEL - Abnormal; Notable for the following components:      Result Value   Sodium 134 (*)    Total Protein 6.0 (*)    Albumin 2.4 (*)    AST 50 (*)    Alkaline Phosphatase 140 (*)    Total Bilirubin 1.8 (*)    All other components within normal limits  CBC - Abnormal; Notable for the following components:   RBC 3.09 (*)    Hemoglobin 10.8 (*)    HCT 32.5 (*)    MCV 105.2 (*)    MCH 35.0 (*)    All other components within normal limits  POC OCCULT BLOOD, ED - Abnormal; Notable for the following components:   Fecal Occult Bld POSITIVE (*)    All other components within normal limits  LIPASE, BLOOD  URINALYSIS, ROUTINE W REFLEX MICROSCOPIC  TYPE AND SCREEN    EKG EKG Interpretation  Date/Time:  Wednesday July 23 2017 15:46:42  EDT Ventricular Rate:  81 PR Interval:    QRS Duration: 108 QT Interval:  364 QTC Calculation: 423 R Axis:   -40 Text Interpretation:  Sinus rhythm Left anterior fascicular block Probable left ventricular hypertrophy Anterior Q waves, possibly due to LVH Baseline wander in lead(s) V2 Confirmed by Nat Christen [phone]) on 07/23/2017 4:46:11 PM   Radiology Dg Chest 2 View  Result Date: 07/23/2017 CLINICAL DATA:  Left-side chest pain and shortness of breath since yesterday. EXAM: CHEST - 2 VIEW COMPARISON:  PA and lateral chest 06/29/2017 and 06/09/2017. CT chest 08/18/2012. FINDINGS: Lung volumes are low as on the prior examinations but the lungs are clear. Heart size is enlarged. No pneumothorax or pleural fluid. Aortic atherosclerosis is noted. IMPRESSION: No acute disease in a low volume chest. Cardiomegaly. Atherosclerosis. Electronically Signed   By: Inge Rise M.D.   On: 07/23/2017 16:58    Procedures Procedures (including critical care time)  Medications Ordered in ED Medications - No data to display   Initial Impression / Assessment and Plan / ED Course  I have reviewed the triage vital signs and the nursing notes.  Pertinent labs & imaging results that were available during my care of the patient were reviewed by me and considered in my medical decision making (see chart for details).     Patient with a known history of nonalcoholic cirrhosis and ascites presents with worsening abdominal distention.  She is status post paracentesis on 06/30/2017.  She will need another procedure.  Admit to general medicine.  Final Clinical Impressions(s) / ED Diagnoses   Final diagnoses:  Cirrhosis of liver with ascites, unspecified hepatic cirrhosis type [facility])    ED Discharge Orders    None       Nat Christen, MD 07/23/17 1850    Nat Christen, MD 07/23/17 [phone]

## 2017-07-23 NOTE — Assessment & Plan Note (Signed)
Patient appears to have anasarca.  Her ankles and feet are significantly edematous, she is barely able to bend her knees and ankles enough to get onto the exam table.  She has pitting edema up to her hips.  Her abdomen is somewhat firm and distended, shifting dullness indicative of likely fluid collection/ascites.  She is likely suffering from decompensated liver disease with worsening edema.  At this point I feel she would be best served by proceeding to the emergency department for consideration of admission.  She will likely need significant diuresis, consideration for IV albumin pending her electrolytes and metabolic panel.  She may need CT exam.  At some point, likely as an outpatient, she will need EGD to evaluate for varices given her complaints of dark stools.

## 2017-07-23 NOTE — Assessment & Plan Note (Signed)
No previous varices noted in 2016 or 2017.  It is been 2 years.  Her last CT exam approximately 3 to 4 months ago found mild esophageal varices on exam and signs of portal hypertension.  She has been admitted this year once for decompensated liver disease and I am recommending she go to the emergency department to be evaluated again given her symptoms today.  At some point she would likely benefit from surveillance EGD given the CT findings, likely progressive liver disease in order to ensure that she is not developing varices.  Continue PPI.  I will recommend she proceed to the emergency department.

## 2017-07-23 NOTE — Assessment & Plan Note (Signed)
Decreased appetite along with nausea with associated abdominal swelling and likely anasarca.  This is likely due to fluid collections in the abdomen.  I feel she has decompensating liver disease.

## 2017-07-23 NOTE — ED Notes (Signed)
Pt taken to xray 

## 2017-07-23 NOTE — Progress Notes (Signed)
Referring Provider: Sharilyn Sites, MD Primary Care Physician:  Sharilyn Sites, MD Primary GI:  Dr. Oneida Alar  Chief Complaint  Patient presents with  . Abdominal Pain  . Nausea    HPI:   Melanie Porter is a 77 y.o. female who presents for evaluation of abdominal pain.  The patient was last seen in our office 02/13/2017 for cirrhosis, non-cyclical vomiting with nausea, GERD, abnormal weight loss.  Bowel movements controlled on fiber Gummies.  Nausea previous day, vomiting of pure water.  Decreased oral intake.  Upper abdominal fullness.  Upper abdominal pain.  All this for the past 3 months.  Weight loss of 13 pounds in the previous year.  Lidocaine, continue Protonix, CT scan, follow-up in 6 months.  CT of the abdomen found hepatic cirrhosis and portal hypertension including mild esophageal varices, no evidence of hepatic neoplasm, mild ascites and diffuse body wall edema, mild upper abdominal lymphadenopathy which is stable and most likely reactive.  Results relayed to patient and recommended low-salt diet, labs to check kidney and liver function, Aldactone 50 mg and Lasix 20 mg daily.  Labs are completed which found mild anemia with hemoglobin of 11.5, no leukocytosis, platelet count not measurable due to clumping, normal B12, folate, thyroid hormones, INR mildly elevated at 1.2, AST mildly elevated at 43, ALT normal, bilirubin elevated at 1.3, alkaline phosphatase elevated at 172.  TSH elevated at 5.18.  Patient called 04/02/2017 indicating rectal irritation and itching of her fingers on Aldactone.  Recommended holding the medicines to see if symptoms abate.  She was admitted to the hospital from 06/29/2017 through 07/02/2017 for volume overload, anasarca.  The patient noted worsening swelling and difficulty ambulating due to this.  Indicated she had been watching her salt intake, fluid intake, taking her diuretics at home.  Diagnosed with decompensated liver cirrhosis with anasarca and  significantly diuresed with IV Lasix.  Paracentesis on 06/30/2017 with 2.8 L removed, transitioned to Lasix 40 mg twice daily in addition to Spironolactone 50 mg daily.  2D echo with EF of 65 to 70% with grade 1 diastolic dysfunction.  Anemia deemed due to [facility] cirrhosis with hemoglobin stable between 10-10.5.  Recent liver calculations 02/26/18: MELD 9, Child-Pugh B.  Today she states she's not feeling well. She is having more swelling (worse than day of discharge). Swelling is abdominal and LE bilaterally. Taking her diuretics and low salt diet as recommended. Having some hemorrhoid issues including hematochezia in the commode and toilet tissue. She was told in the hospital the bleeding was from hemorrhoids. Stools typically [place] 5-6, at least 3-5 times a day. Has "pitch black, sticky stools" which occurs about "every now and then" with last episode this morning. Decreased appetie, nausea. No vomiting. Abdominal pain is left-sided. Had regurgitation of 'clear fluid." Still on Protonix. Denies esophageal burning, bitter taste; "doesn't control the sickness on my stomach." States she's had yellowing of her eyes, darkened urine, has increased fatigue/sleepiness; thinks she has some confusion (but daughter doesn't think she's having significant confusion.) Denies chest pain, dyspnea, dizziness, lightheadedness, syncope, near syncope. Denies any other upper or lower GI symptoms.  Past Medical History:  Diagnosis Date  . Ankle fracture, right 05/21/10  . Anxiety   . Arthritis   . Back fracture 05/21/10  . Depression   . Fibromyalgia   . GERD (gastroesophageal reflux disease)   . Hypercholesteremia   . 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 Knee fracture, right 05/21/10  . 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 NOV 2017 CHILD PUGH B, MELD 9  . Obesity   . Splenomegaly 09/23/2012  . Thrombocytopenia ([place]) 02/19/2012    Past  Surgical History:  Procedure Laterality Date  . ABDOMINAL HYSTERECTOMY    . BREAST BIOPSY  1982   left benign  . CATARACT EXTRACTION W/PHACO Right 07/11/2015   Procedure: CATARACT EXTRACTION PHACO AND INTRAOCULAR LENS PLACEMENT (IOC);  Surgeon: Rutherford Guys, MD;  Location: [facility];  Service: Ophthalmology;  Laterality: Right;  CDE: 6.00  . CATARACT EXTRACTION W/PHACO Left 07/25/2015   Procedure: CATARACT EXTRACTION PHACO AND INTRAOCULAR LENS PLACEMENT (IOC);  Surgeon: Rutherford Guys, MD;  Location: [facility];  Service: Ophthalmology;  Laterality: Left;  CDE:8.58  . COLONOSCOPY  June 2010   Dr. Collene Mares: few scattered sigmoid diverticula, otherwise normal. Screening in 2020   . ESOPHAGOGASTRODUODENOSCOPY  03/02/2012   Dr. Oneida Alar:The mucosa of the esophagus appeared normal Multiple non-bleeding ulcers, ranging between 3-54mm in size were found in the gastric antrum  The duodenal mucosa showed no abnormalities in the bulb and second portion of the duodenum. path negative for H.pylori  . ESOPHAGOGASTRODUODENOSCOPY N/A 07/15/2014   SLF: 1. No esophageal, gastric, or duodenal varices 2. small 1-2 mm gastric ulcer. Negative H.pylori   . ESOPHAGOGASTRODUODENOSCOPY N/A 06/05/2015   SLF: 1. Mild gastritis on ASA and PPI  . PARTIAL HYSTERECTOMY      Current Outpatient Medications  Medication Sig Dispense Refill  . aspirin EC 81 MG tablet Take 81 mg by mouth at bedtime.    . Cholecalciferol (VITAMIN D3) 5000 units CAPS Take 1 capsule daily by mouth.    Marland Kitchen FLUoxetine (PROZAC) 20 MG capsule Take 20 mg by mouth daily.  0  . furosemide (LASIX) 40 MG tablet Take 1 tablet (40 mg total) by mouth 2 (two) times daily. 60 tablet 2  . levothyroxine (SYNTHROID, LEVOTHROID) 75 MCG tablet Take 75 mcg by mouth daily before breakfast.    . lidocaine-hydrocortisone (ANAMANTEL HC) 3-0.5 % CREA Apply to the external hemorrhoid area 4 times daily as needed 1 Tube 0  . loratadine-pseudoephedrine (CLARITIN-D 24-HOUR) 10-240 MG 24 hr tablet  Take 1 tablet by mouth daily as needed for allergies.    . pantoprazole (PROTONIX) 40 MG tablet Take 1 tablet (40 mg total) by mouth daily. 90 tablet 3  . Propylene Glycol (SYSTANE BALANCE) 0.6 % SOLN Apply 1 drop to eye daily as needed. Dry Eyes    . simvastatin (ZOCOR) 40 MG tablet Take 40 mg by mouth daily.     Marland Kitchen spironolactone (ALDACTONE) 50 MG tablet Take 1 tablet (50 mg total) by mouth daily. WITH LASIX 30 tablet 11   No current facility-administered medications for this visit.     Allergies as of 07/23/2017  . (No Known Allergies)    Family History  Problem Relation Age of Onset  . Heart disease Father   . Heart attack Father   . Aneurysm Mother   . Diabetes Sister   . Hypertension Sister   . Heart disease Sister   . Hypertension Brother   . Heart disease Brother   . Colon cancer Neg Hx     Social History   Socioeconomic History  . Marital status: Widowed    Spouse name: Not on file  . Number of children: Not on file  . Years of education: Not on file  . Highest education level: Not on file  Occupational History  .  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 Financial resource strain: Not on file  . Food insecurity:    Worry: Not on file    Inability: Not on file  . Transportation needs:    Medical: Not on file    Non-medical: Not on file  Tobacco Use  . Smoking status: Never Smoker  . Smokeless tobacco: Never Used  . Tobacco comment: Never smoked  Substance and Sexual Activity  . Alcohol use: No    Alcohol/week: 0.0 oz  . Drug use: No  . Sexual activity: Never  Lifestyle  . Physical activity:    Days per week: Not on file    Minutes per session: Not on file  . Stress: Not on file  Relationships  . Social connections:    Talks on phone: Not on file    Gets together: Not on file    Attends religious service: Not on file    Active member of club or organization: Not on file    Attends meetings of clubs  or organizations: Not on file    Relationship status: Not on file  Other Topics Concern  . Not on file  Social History Narrative   HAS A HUSBAND WHO MAY HAVE DEMENTIA AND IS CHALLENGING TO CARE FOR.    Review of Systems: General: Negative for anorexia, weight loss, fever, chills. ENT: Negative for hoarseness, difficulty swallowing. CV: Negative for chest pain, angina, palpitations, peripheral edema.  Respiratory: Negative for cough, sputum, wheezing. Admits dyspnea, worse on exertion. GI: See history of present illness. MS: Negative for joint pain, low back pain.  Derm: Negative for rash or itching.  Neuro: Admits memory loss, confusion as per HPI.  Endo: Negative for unusual weight change.  Heme: Negative for bruising or bleeding. Allergy: Negative for rash or hives.   Physical Exam: BP 114/64   Pulse 87   Temp (!) 97.2 F (36.2 C) (Oral)   Ht 5\' 2"  (1.575 m)   Wt 230 lb 3.2 oz (104.4 kg)   BMI 42.10 kg/m  General:   Alert and oriented. Pleasant and cooperative. Well-nourished and well-developed. Appears to be ill. Appears in significant pain. Eyes:  Without icterus, sclera clear and conjunctiva pink.  Ears:  Normal auditory acuity. Cardiovascular:  S1, S2 present without murmurs appreciated. Extremities without clubbing or edema. Respiratory:  Clear to auscultation bilaterally. No wheezes, rales, or rhonchi. Noted increased dyspnea with mild activity.  Gastrointestinal:  +BS, firm, Mild to moderate TTP (worse left side) and somewhat distended. Difficulty assessing for HSM. No guarding or rebound. No masses appreciated. Appears to have shifting dullness. Appears to have anasarca with pitting edema through upper thighs; ankles and calves significantly edematous. Rectal:  Deferred  Musculoskalatal:  Symmetrical without gross deformities. Neurologic:  Alert and oriented x4;  grossly normal neurologically. Psych:  Alert and cooperative. Normal mood and affect.    07/23/2017  2:36 PM   Disclaimer: This note was dictated with voice recognition software. Similar sounding words can inadvertently be transcribed and may not be corrected upon review.

## 2017-07-23 NOTE — Patient Instructions (Signed)
1. As we discussed, I recommend you proceed to the emergency department. 2. I have called ahead to the emergency department to make them aware that you are coming and why we are sending you. 3. Call us if you have any questions or concerns. 4. Assuming they admit you, we will see you while you are in the hospital.   It was nice to meet you today, I hope you feel better soon!!!!!    At [facility] we value your feedback. You may receive a survey about your visit today. Please share your experience as we strive to create trusting relationships with our patients to provide genuine, compassionate, quality care.

## 2017-07-23 NOTE — ED Triage Notes (Addendum)
Sent over by PCP for "fluid overload" pt c/o of lower abdominal pain, left sided flank pain with nausea starting this morning. Pt states having a black stool yesterday.

## 2017-07-23 NOTE — Progress Notes (Signed)
cc'ed to pcp °

## 2017-07-24 ENCOUNTER — Telehealth: Payer: Self-pay | Admitting: Gastroenterology

## 2017-07-24 ENCOUNTER — Inpatient Hospital Stay (HOSPITAL_COMMUNITY): Payer: PPO

## 2017-07-24 DIAGNOSIS — R161 Splenomegaly, not elsewhere classified: Secondary | ICD-10-CM

## 2017-07-24 DIAGNOSIS — K7460 Unspecified cirrhosis of liver: Secondary | ICD-10-CM

## 2017-07-24 DIAGNOSIS — E782 Mixed hyperlipidemia: Secondary | ICD-10-CM

## 2017-07-24 DIAGNOSIS — D696 Thrombocytopenia, unspecified: Secondary | ICD-10-CM

## 2017-07-24 DIAGNOSIS — E038 Other specified hypothyroidism: Secondary | ICD-10-CM

## 2017-07-24 DIAGNOSIS — R188 Other ascites: Secondary | ICD-10-CM

## 2017-07-24 DIAGNOSIS — R601 Generalized edema: Secondary | ICD-10-CM

## 2017-07-24 LAB — BODY FLUID CELL COUNT WITH DIFFERENTIAL
Eos, Fluid: 0 %
Lymphs, Fluid: 39 %
Monocyte-Macrophage-Serous Fluid: 47 % — ABNORMAL LOW (ref 50–90)
NEUTROPHIL FLUID: 14 % (ref 0–25)
WBC FLUID: 85 uL (ref 0–1000)

## 2017-07-24 LAB — CBC
HEMATOCRIT: 29.2 % — AB (ref 36.0–46.0)
Hemoglobin: 9.7 g/dL — ABNORMAL LOW (ref 12.0–15.0)
MCH: 34.8 pg — AB (ref 26.0–34.0)
MCHC: 33.2 g/dL (ref 30.0–36.0)
MCV: 104.7 fL — AB (ref 78.0–100.0)
Platelets: 55 10*3/uL — ABNORMAL LOW (ref 150–400)
RBC: 2.79 MIL/uL — ABNORMAL LOW (ref 3.87–5.11)
RDW: 13.1 % (ref 11.5–15.5)
WBC: 4.7 10*3/uL (ref 4.0–10.5)

## 2017-07-24 LAB — COMPREHENSIVE METABOLIC PANEL
ALBUMIN: 2.1 g/dL — AB (ref 3.5–5.0)
ALT: 21 U/L (ref 14–54)
AST: 43 U/L — AB (ref 15–41)
Alkaline Phosphatase: 132 U/L — ABNORMAL HIGH (ref 38–126)
Anion gap: 8 (ref 5–15)
BUN: 11 mg/dL (ref 6–20)
CHLORIDE: 104 mmol/L (ref 101–111)
CO2: 25 mmol/L (ref 22–32)
Calcium: 9.2 mg/dL (ref 8.9–10.3)
Creatinine, Ser: 0.74 mg/dL (ref 0.44–1.00)
GFR calc Af Amer: >60 mL/min (ref >60)
GFR calc non Af Amer: >60 mL/min (ref >60)
GLUCOSE: 104 mg/dL — AB (ref 65–99)
POTASSIUM: 3.7 mmol/L (ref 3.5–5.1)
SODIUM: 137 mmol/L (ref 135–145)
Total Bilirubin: 1.6 mg/dL — ABNORMAL HIGH (ref 0.3–1.2)
Total Protein: 5.1 g/dL — ABNORMAL LOW (ref 6.5–8.1)

## 2017-07-24 LAB — PROTEIN / CREATININE RATIO, URINE
Creatinine, Urine: 81.47 mg/dL
PROTEIN CREATININE RATIO: 0.21 mg/mg{creat} — AB (ref 0.00–0.15)
Total Protein, Urine: 17 mg/dL

## 2017-07-24 LAB — GRAM STAIN
GRAM STAIN: NONE SEEN
SPECIAL REQUESTS: ADEQUATE

## 2017-07-24 MED ORDER — HYDROCORTISONE ACE-PRAMOXINE 2.5-1 % RE CREA
TOPICAL_CREAM | Freq: Two times a day (BID) | RECTAL | Status: DC
  Administered 2017-07-24 (×2): via RECTAL
  Administered 2017-07-25: 1 via RECTAL
  Administered 2017-07-25: 21:00:00 via RECTAL
  Administered 2017-07-26: 1 via RECTAL
  Administered 2017-07-26 – 2017-07-29 (×6): via RECTAL
  Filled 2017-07-24: qty 30

## 2017-07-24 MED ORDER — SPIRONOLACTONE 25 MG PO TABS
50.0000 mg | ORAL_TABLET | Freq: Two times a day (BID) | ORAL | Status: DC
  Administered 2017-07-24 – 2017-07-29 (×11): 50 mg via ORAL
  Filled 2017-07-24 (×10): qty 2

## 2017-07-24 NOTE — Procedures (Signed)
PreOperative Dx: Nonalcoholic micronodular cirrhosis, ascites Postoperative Dx: Nonalcoholic micronodular  cirrhosis, ascites Procedure:   US guided paracentesis Radiologist:  Thornton Papas Anesthesia:  10 ml of1% lidocaine Specimen:  2.5 L of yellow ascitic fluid EBL:   < 1 ml Complications: None

## 2017-07-24 NOTE — Care Management Note (Addendum)
Case Management Note  Patient Details  Name: Melanie Porter MRN: [id number] Date of Birth: 03-31-1941  Subjective/Objective: Admitted with anasarca. Pt from home, lives alone, ind with ADL's. Has PCP, drives herself, does her own shopping. She has insurance with drug coverage.                     Action/Plan: Anticipate DC home with self care. Usually declines Home health. She is receiving [facility] emmi calls pta, will resume.   ADDENDUM: Discussed Home health with patient, she is agreeable. Would like [facility]. [name] of [facility] notified and will follow for orders. Anticipate at weekend DC.   Expected Discharge Date:  07/25/17               Expected Discharge Plan:     In-House Referral:     Discharge planning Services  CM Consult  Post Acute Care Choice:    Choice offered to:     DME Arranged:    DME Agency:     HH Arranged:   RN, PT [place] Agency:   [place]  Status of Service:  In process, will continue to follow  If discussed at Long Length of Stay Meetings, dates discussed:    Additional Comments:  Kristalynn Coddington, Chauncey Reading, RN 07/24/2017, 1:53 PM

## 2017-07-24 NOTE — Progress Notes (Signed)
PROGRESS NOTE  Melanie Porter GGY:[id number] DOB: Jan 14, 1941 DOA: 07/23/2017 PCP: Sharilyn Sites, MD  Brief History:  77 year old female with a history of hyperlipidemia,NASH Cirrhosis, splenomegaly, hypothyroidism presenting with worsening lower extremity edema, increasing abdominal girth, and nausea.  She was seen in Dr. Oneida Alar' office on 07/23/17 and sent to ED due to anasarca.  The patient was recently admitted to the hospital from 06/29/2017 through 07/29/2017 for decompensated liver cirrhosis.  She had a paracentesis on 06/30/2017 removing 2.8 L.  She endorses compliance with her diet as well as her medications.  She has intermittent abdominal pain but denies any vomiting, hematemesis.  She has had some intermittent hematochezia which she has been attributing to hemorrhoids.  She denies any chest pain, shortness breath, coughing, hemoptysis.  The patient was admitted for decompensated liver cirrhosis and started on IV furosemide.   Assessment/Plan: Decompensated liver cirrhosis with anasarca -Continue IV furosemide BID--1700 cc urine out last 24 hours -Requested repeat paracentesis -Continue Spironolactone -06/30/2017 echo--EF 65-70%, no WMA, grade 1 DD -doubt decompensated CHF as EF is perserved  -Check urine protein creatinine ratio -daily weights -increase spironolactone to 50 mg bid  Thrombocytopenia -Secondary to liver cirrhosis -Monitor for signs of bleeding -am CBC  Hypothyroidism -continue Synthroid  Depression/anxiety -Continue fluoxetine  Hyperlipidemia -Continue statin  Hematochezia -Hgb stable -baseline Hgb ~10-11 -will likely need outpt endoscopy -am CBC    Disposition Plan:   Home in 2-3 days  Family Communication:  No Family at bedside  Consultants:  none  Code Status:  FULL   DVT Prophylaxis:  SCDs   Procedures: As Listed in Progress Note Above  Antibiotics: None    Subjective: Patient continues to have intermittent nausea.   She states her abdominal pain is better.  She continues to have lower extremity edema and increasing abdominal girth.  She denies any chest pain, shortness breath, coughing, hemoptysis, dysuria, hematuria, dizziness.  Objective: Vitals:   07/23/17 1930 07/23/17 2000 07/23/17 2038 07/24/17 0355  BP: (!) 109/44 (!) 109/50 (!) 122/48 (!) 118/51  Pulse: 73 73 76 80  Resp: (!) 22 (!) 23 18 18   Temp:   97.9 F (36.6 C) 98.2 F (36.8 C)  TempSrc:   Oral Oral  SpO2: 95% 96% 97% 91%  Weight:   103.5 kg (228 lb 2.8 oz) 103.7 kg (228 lb 9.9 oz)  Height:   5\' 2"  (1.575 m)     Intake/Output Summary (Last 24 hours) at 07/24/2017 0716 Last data filed at 07/24/2017 0356 Gross per 24 hour  Intake -  Output 1500 ml  Net -1500 ml   Weight change:  Exam:   General:  Pt is alert, follows commands appropriately, not in acute distress  HEENT: No icterus, No thrush, No neck mass, [place]/AT  Cardiovascular: RRR, S1/S2, no rubs, no gallops  Respiratory: Bibasilar crackles.  No wheezing.  Abdomen: Soft/+BS, non tender, non distended, no guarding; anasarca  Extremities: 2 + LE edema, No lymphangitis, No petechiae, No rashes, no synovitis   Data Reviewed: I have personally reviewed following labs and imaging studies Basic Metabolic Panel: Recent Labs  Lab 07/23/17 1515 07/24/17 0456  NA 134* 137  K 3.9 3.7  CL 103 104  CO2 24 25  GLUCOSE 99 104*  BUN 13 11  CREATININE 0.81 0.74  CALCIUM 9.2 9.2   Liver Function Tests: Recent Labs  Lab 07/23/17 1515 07/24/17 0456  AST 50* 43*  ALT 24 21  ALKPHOS 140* 132*  BILITOT 1.8* 1.6*  PROT 6.0* 5.1*  ALBUMIN 2.4* 2.1*   Recent Labs  Lab 07/23/17 1515  LIPASE 25   No results for input(s): AMMONIA in the last 168 hours. Coagulation Profile: No results for input(s): INR, PROTIME in the last 168 hours. CBC: Recent Labs  Lab 07/23/17 1515 07/24/17 0456  WBC 5.8 4.7  HGB 10.8* 9.7*  HCT 32.5* 29.2*  MCV 105.2* 104.7*  PLT SPECIMEN  CHECKED FOR CLOTS 55*   Cardiac Enzymes: No results for input(s): CKTOTAL, CKMB, CKMBINDEX, TROPONINI in the last 168 hours. BNP: Invalid input(s): POCBNP CBG: No results for input(s): GLUCAP in the last 168 hours. HbA1C: No results for input(s): HGBA1C in the last 72 hours. Urine analysis:    Component Value Date/Time   COLORURINE YELLOW 07/23/2017 [place] 07/23/2017 1509   LABSPEC 1.016 07/23/2017 1509   PHURINE 6.0 07/23/2017 1509   GLUCOSEU NEGATIVE 07/23/2017 1509   HGBUR NEGATIVE 07/23/2017 1509   BILIRUBINUR NEGATIVE 07/23/2017 1509   KETONESUR NEGATIVE 07/23/2017 1509   PROTEINUR NEGATIVE 07/23/2017 1509   UROBILINOGEN 0.2 07/27/2014 1220   NITRITE NEGATIVE 07/23/2017 1509   LEUKOCYTESUR NEGATIVE 07/23/2017 1509   Sepsis Labs: @LABRCNTIP (procalcitonin:4,lacticidven:4) )No results found for this or any previous visit (from the past 240 hour(s)).   Scheduled Meds: . aspirin EC  81 mg Oral QHS  . cholecalciferol  5,000 Units Oral Daily  . FLUoxetine  20 mg Oral Daily  . furosemide  40 mg Intravenous BID  . levothyroxine  75 mcg Oral QAC breakfast  . lidocaine-hydrocortisone   Rectal BID  . pantoprazole  40 mg Oral Daily  . potassium chloride SA  20 mEq Oral Daily  . simvastatin  40 mg Oral q1800  . sodium chloride flush  3 mL Intravenous Q12H  . spironolactone  50 mg Oral Daily   Continuous Infusions: . sodium chloride      Procedures/Studies: Dg Chest 2 View  Result Date: 07/23/2017 CLINICAL DATA:  Left-side chest pain and shortness of breath since yesterday. EXAM: CHEST - 2 VIEW COMPARISON:  PA and lateral chest 06/29/2017 and 06/09/2017. CT chest 08/18/2012. FINDINGS: Lung volumes are low as on the prior examinations but the lungs are clear. Heart size is enlarged. No pneumothorax or pleural fluid. Aortic atherosclerosis is noted. IMPRESSION: No acute disease in a low volume chest. Cardiomegaly. Atherosclerosis. Electronically Signed   By:  Inge Rise M.D.   On: 07/23/2017 16:58   Dg Chest 2 View  Result Date: 06/29/2017 CLINICAL DATA:  Dyspnea EXAM: CHEST - 2 VIEW COMPARISON:  06/09/2017 chest radiograph. FINDINGS: Low lung volumes. Stable cardiomediastinal silhouette with normal heart size. No pneumothorax. No pleural effusion. No overt pulmonary edema. Mild bibasilar atelectasis. No acute consolidative airspace disease. IMPRESSION: Low lung volumes with mild bibasilar atelectasis. Electronically Signed   By: Ilona Sorrel M.D.   On: 06/29/2017 23:28   US Paracentesis  Result Date: 06/30/2017 INDICATION: Ascites EXAM: ULTRASOUND GUIDED DIAGNOSTIC AND THERAPEUTIC PARACENTESIS MEDICATIONS: None. COMPLICATIONS: None immediate. PROCEDURE: Procedure, benefits, and risks of procedure were discussed with patient. Written informed consent for procedure was obtained. Time out protocol followed. Adequate collection of ascites localized by ultrasound in RIGHT lower quadrant. Skin prepped and draped in usual sterile fashion. Skin and soft tissues anesthetized with 10 mL of 1% lidocaine. 5 [place] Yueh catheter placed into peritoneal cavity. 2.8 L of clear yellow fluid fluid aspirated by vacuum bottle suction. Procedure tolerated well by patient without  immediate complication. FINDINGS: A total of approximately 2.8 L of ascitic fluid was removed. Samples were sent to the laboratory as requested by the clinical team. IMPRESSION: Successful ultrasound-guided paracentesis yielding 2.8 liters of peritoneal fluid. Electronically Signed   By: Lavonia Dana M.D.   On: 06/30/2017 14:13    Orson Eva, DO  [facility] Pager [phone]  If 7PM-7AM, please contact night-coverage [email or web address] Password [email or web address] 07/24/2017, 7:16 AM   LOS: 1 day

## 2017-07-24 NOTE — Telephone Encounter (Signed)
Labs are on file and ready to be released for 09/04/2017.

## 2017-07-24 NOTE — Telephone Encounter (Signed)
Recall for labs ths, free t3/t4

## 2017-07-24 NOTE — Progress Notes (Signed)
Paracentesis complete no signs of distress.  

## 2017-07-25 LAB — PATHOLOGIST SMEAR REVIEW

## 2017-07-25 LAB — BASIC METABOLIC PANEL
ANION GAP: 8 (ref 5–15)
BUN: 14 mg/dL (ref 6–20)
CHLORIDE: 99 mmol/L — AB (ref 101–111)
CO2: 26 mmol/L (ref 22–32)
Calcium: 8.9 mg/dL (ref 8.9–10.3)
Creatinine, Ser: 0.82 mg/dL (ref 0.44–1.00)
GFR calc Af Amer: >60 mL/min (ref >60)
GLUCOSE: 89 mg/dL (ref 65–99)
Potassium: 3.8 mmol/L (ref 3.5–5.1)
Sodium: 133 mmol/L — ABNORMAL LOW (ref 135–145)

## 2017-07-25 LAB — CBC
HCT: 30.3 % — ABNORMAL LOW (ref 36.0–46.0)
HEMOGLOBIN: 10 g/dL — AB (ref 12.0–15.0)
MCH: 35 pg — AB (ref 26.0–34.0)
MCHC: 33 g/dL (ref 30.0–36.0)
MCV: 105.9 fL — AB (ref 78.0–100.0)
Platelets: 58 10*3/uL — ABNORMAL LOW (ref 150–400)
RBC: 2.86 MIL/uL — ABNORMAL LOW (ref 3.87–5.11)
RDW: 13.6 % (ref 11.5–15.5)
WBC: 4.1 10*3/uL (ref 4.0–10.5)

## 2017-07-25 MED ORDER — KETOROLAC TROMETHAMINE 15 MG/ML IJ SOLN
15.0000 mg | Freq: Once | INTRAMUSCULAR | Status: AC
  Administered 2017-07-25: 15 mg via INTRAVENOUS
  Filled 2017-07-25: qty 1

## 2017-07-25 MED ORDER — ALBUMIN HUMAN 25 % IV SOLN
25.0000 g | Freq: Once | INTRAVENOUS | Status: AC
  Administered 2017-07-25: 25 g via INTRAVENOUS
  Filled 2017-07-25: qty 100

## 2017-07-25 NOTE — Evaluation (Signed)
Physical Therapy Evaluation Patient Details Name: Melanie Porter MRN: [id number] DOB: 03-16-1941 Today's Date: 07/25/2017   History of Present Illness  Melanie Porter is a 77 y.o. female with medical history significant for NASH cirrhosis with ascites and recent paracentesis on 06/30/2017 with removal of 2.8 L of fluid who was seen in  at Dr. Oneida Alar office by Walden Field NP today for complaints of abdominal pain as well as some nausea and decreased oral intake.  Patient was noted to have anasarca and ascites for which she was recommended to come to the emergency department for further evaluation and paracentesis.  She has also had some dark stools recently and was recommended to have EGD in the outpatient setting after paracentesis during her visit and GI earlier today.  She states that she has been taking her home Lasix and spironolactone is otherwise recommended and has been careful with her fluid intake otherwise.    Clinical Impression  Patient functioning near baseline for functional mobility and gait other slightly labored cadence without loss of balance, mostly limited due to c/o fatigue.  Patient encouraged to ambulate with nursing staff over weekend and sit up in chair as much as possible.  Patient will benefit from continued physical therapy in hospital and recommended venue below to increase strength, balance, endurance for safe ADLs and gait.    Follow Up Recommendations Home health PT;Supervision - Intermittent    Equipment Recommendations  None recommended by PT    Recommendations for Other Services       Precautions / Restrictions Precautions Precautions: Fall Restrictions Weight Bearing Restrictions: No      Mobility  Bed Mobility Overal bed mobility: Needs Assistance Bed Mobility: Supine to Sit;Sit to Supine     Supine to sit: Supervision     General bed mobility comments: labored movement for moving legs on/off bed  Transfers Overall transfer level: Needs  assistance Equipment used: Rolling walker (2 wheeled) Transfers: Sit to/from [facility] Sit to Stand: Supervision Stand pivot transfers: Supervision          Ambulation/Gait Ambulation/Gait assistance: Supervision Ambulation Distance (Feet): 45 Feet Assistive device: Rolling walker (2 wheeled) Gait Pattern/deviations: Decreased step length - right;Decreased step length - left;Decreased stride length Gait velocity: reduced   General Gait Details: demonstrates slightly labored slow cadence without loss of balance, limited secondary to c/o fatigue  Stairs            Wheelchair Mobility    Modified Rankin (Stroke Patients Only)       Balance Overall balance assessment: Needs assistance Sitting-balance support: No upper extremity supported;Feet supported Sitting balance-Leahy Scale: Good     Standing balance support: During functional activity;Bilateral upper extremity supported Standing balance-Leahy Scale: Fair                               Pertinent Vitals/Pain Pain Assessment: No/denies pain    Home Living Family/patient expects to be discharged to:: Private residence Living Arrangements: Alone Available Help at Discharge: Family;Available PRN/intermittently(lives close by) Type of Home: House Home Access: Ramped entrance;Stairs to enter Entrance Stairs-Rails: Right;Left;Can reach both(patient does not go through carport) [employer] of Steps: 4 steps into carport Home Layout: One level Home Equipment: Walker - 2 wheels;Shower seat;Wheelchair - manual;Cane - single point      Prior Function Level of Independence: Independent with assistive device(s)         Comments: household distances using RW  Hand Dominance        Extremity/Trunk Assessment   Upper Extremity Assessment Upper Extremity Assessment: Overall WFL for tasks assessed    Lower Extremity Assessment Lower Extremity Assessment:  Generalized weakness    Cervical / Trunk Assessment Cervical / Trunk Assessment: Normal  Communication   Communication: No difficulties  Cognition Arousal/Alertness: Awake/alert Behavior During Therapy: WFL for tasks assessed/performed Overall Cognitive Status: Within Functional Limits for tasks assessed                                        General Comments      Exercises     Assessment/Plan    PT Assessment Patient needs continued PT services  PT Problem List Decreased strength;Decreased activity tolerance;Decreased balance;Decreased mobility       PT Treatment Interventions Gait training;Stair training;Functional mobility training;Therapeutic activities;Therapeutic exercise;Patient/family education    PT Goals (Current goals can be found in the Care Plan section)  Acute Rehab PT Goals Patient Stated Goal: return home with to assist PRN PT Goal Formulation: With patient Time For Goal Achievement: 07/22/17 Potential to Achieve Goals: Good    Frequency Min 3X/week   Barriers to discharge        Co-evaluation               AM-PAC PT "6 Clicks" Daily Activity  Outcome Measure Difficulty turning over in bed (including adjusting bedclothes, sheets and blankets)?: None Difficulty moving from lying on back to sitting on the side of the bed? : None Difficulty sitting down on and standing up from a chair with arms (e.g., wheelchair, bedside commode, etc,.)?: None Help needed moving to and from a bed to chair (including a wheelchair)?: A Little Help needed walking in hospital room?: A Little Help needed climbing 3-5 steps with a railing? : A Little 6 Click Score: 21    End of Session   Activity Tolerance: Patient tolerated treatment well;Patient limited by fatigue Patient left: in bed;with call bell/phone within reach Nurse Communication: Mobility status PT Visit Diagnosis: Unsteadiness on feet (R26.81);Other abnormalities of gait and mobility  (R26.89);Muscle weakness (generalized) (M62.81)    Time: 4650-3546 PT Time Calculation (min) (ACUTE ONLY): 28 min   Charges:   PT Evaluation $PT Eval Moderate Complexity: 1 Mod PT Treatments $Therapeutic Activity: 23-37 mins   PT G Codes:        3:51 PM, 07-26-2017 Lonell Grandchild, MPT Physical Therapist with [facility] [phone] office [phone] mobile phone

## 2017-07-25 NOTE — Progress Notes (Signed)
PROGRESS NOTE  Melanie Porter GLO:[id number] DOB: 12-29-1940 DOA: 07/23/2017 PCP: Sharilyn Sites, MD  Brief History:  77 year old female with a history of hyperlipidemia,NASH Cirrhosis, splenomegaly, hypothyroidism presenting with worsening lower extremity edema, increasing abdominal girth, and nausea.  She was seen in Dr. Oneida Alar' office on 07/23/17 and sent to ED due to anasarca.  The patient was recently admitted to the hospital from 06/29/2017 through 07/29/2017 for decompensated liver cirrhosis.  She had a paracentesis on 06/30/2017 removing 2.8 L.  She endorses compliance with her diet as well as her medications.  She has intermittent abdominal pain but denies any vomiting, hematemesis.  She has had some intermittent hematochezia which she has been attributing to hemorrhoids.  She denies any chest pain, shortness breath, coughing, hemoptysis.  The patient was admitted for decompensated liver cirrhosis and started on IV furosemide.   Assessment/Plan: Decompensated liver cirrhosis with anasarca -Continue IV furosemide BID--1700 cc urine out last 24 hours -4/25 paracentesis-->2.5L removed, albumin given -Continue Spironolactone--increase dose to 50 mg bid -06/30/2017 echo--EF 65-70%, no WMA, grade 1 DD -doubt decompensated CHF as EF is perserved -Check urine protein creatinine ratio--0.21 -daily weights -increase spironolactone to 50 mg bid  Thrombocytopenia -Secondary to liver cirrhosis -Monitor for signs of bleeding -am CBC  Hypothyroidism -continue Synthroid  Depression/anxiety -Continue fluoxetine  Hyperlipidemia -Continue statin  Hematochezia -Hgb stable -baseline Hgb ~10-11 -will likely need outpt endoscopy -am CBC    Disposition Plan:   Home in 1-2 days  Family Communication:  No Family at bedside  Consultants:  none  Code Status:  FULL   DVT Prophylaxis:  SCDs   Procedures: As Listed in Progress Note  Above  Antibiotics: None      Subjective: Patient is feeling a little bit better.  She states her abdominal tightness is improving.  She is breathing better.  She denies any chest pain, shortness breath, nausea, vomiting, diarrhea.  She states that her legs are still swollen but a little bit better.  Objective: Vitals:   07/24/17 1642 07/24/17 2058 07/25/17 0545 07/25/17 0546  BP: (!) 92/45 (!) 99/46  (!) 102/49  Pulse: 77 81  72  Resp: 18 20  20   Temp: 97.9 F (36.6 C) 99.4 F (37.4 C)  97.8 F (36.6 C)  TempSrc: Oral Oral  Oral  SpO2: 90% 90%  93%  Weight:   103.6 kg (228 lb 6.3 oz)   Height:        Intake/Output Summary (Last 24 hours) at 07/25/2017 1329 Last data filed at 07/25/2017 1300 Gross per 24 hour  Intake 960 ml  Output 1200 ml  Net -240 ml   Weight change: -0.727 kg (-1 lb 9.7 oz) Exam:   General:  Pt is alert, follows commands appropriately, not in acute distress  HEENT: No icterus, No thrush, No neck mass, [place]/AT  Cardiovascular: RRR, S1/S2, no rubs, no gallops  Respiratory: CTA bilaterally, no wheezing, no crackles, no rhonchi  Abdomen: Soft/+BS, non tender, non distended, no guarding+Anasarca  Extremities: 2+ LE edema, No lymphangitis, No petechiae, No rashes, no synovitis   Data Reviewed: I have personally reviewed following labs and imaging studies Basic Metabolic Panel: Recent Labs  Lab 07/23/17 1515 07/24/17 0456 07/25/17 0505  NA 134* 137 133*  K 3.9 3.7 3.8  CL 103 104 99*  CO2 24 25 26   GLUCOSE 99 104* 89  BUN 13 11 14   CREATININE 0.81 0.74 0.82  CALCIUM 9.2 9.2  8.9   Liver Function Tests: Recent Labs  Lab 07/23/17 1515 07/24/17 0456  AST 50* 43*  ALT 24 21  ALKPHOS 140* 132*  BILITOT 1.8* 1.6*  PROT 6.0* 5.1*  ALBUMIN 2.4* 2.1*   Recent Labs  Lab 07/23/17 1515  LIPASE 25   No results for input(s): AMMONIA in the last 168 hours. Coagulation Profile: No results for input(s): INR, PROTIME in the last 168  hours. CBC: Recent Labs  Lab 07/23/17 1515 07/24/17 0456 07/25/17 0505  WBC 5.8 4.7 4.1  HGB 10.8* 9.7* 10.0*  HCT 32.5* 29.2* 30.3*  MCV 105.2* 104.7* 105.9*  PLT SPECIMEN CHECKED FOR CLOTS 55* 58*   Cardiac Enzymes: No results for input(s): CKTOTAL, CKMB, CKMBINDEX, TROPONINI in the last 168 hours. BNP: Invalid input(s): POCBNP CBG: No results for input(s): GLUCAP in the last 168 hours. HbA1C: No results for input(s): HGBA1C in the last 72 hours. Urine analysis:    Component Value Date/Time   COLORURINE YELLOW 07/23/2017 [place] 07/23/2017 1509   LABSPEC 1.016 07/23/2017 1509   PHURINE 6.0 07/23/2017 1509   GLUCOSEU NEGATIVE 07/23/2017 1509   HGBUR NEGATIVE 07/23/2017 1509   BILIRUBINUR NEGATIVE 07/23/2017 1509   KETONESUR NEGATIVE 07/23/2017 1509   PROTEINUR NEGATIVE 07/23/2017 1509   UROBILINOGEN 0.2 07/27/2014 1220   NITRITE NEGATIVE 07/23/2017 1509   LEUKOCYTESUR NEGATIVE 07/23/2017 1509   Sepsis Labs: @LABRCNTIP (procalcitonin:4,lacticidven:4) ) Recent Results (from the past 240 hour(s))  Gram stain     Status: None   Collection Time: 07/24/17  2:50 PM  Result Value Ref Range Status   Specimen Description PERITONEAL  Final   Special Requests   Final    Blood Culture adequate volume BOTTLES DRAWN AEROBIC AND ANAEROBIC   Gram Stain   Final    NO ORGANISMS SEEN Performed at [facility] AND MONONUCLEAR Performed at [facility], [address] [place], [place] [address]    Report Status 07/24/2017 FINAL  Final  Culture, body fluid-bottle     Status: None (Preliminary result)   Collection Time: 07/24/17  2:50 PM  Result Value Ref Range Status   Specimen Description PERITONEAL  Final   Special Requests BOTTLES DRAWN AEROBIC AND ANAEROBIC 10CC  Final   Culture   Final    NO GROWTH < 24 HOURS Performed at [facility], [address] [place], [place] [address]    Report Status PENDING   Incomplete     Scheduled Meds: . aspirin EC  81 mg Oral QHS  . cholecalciferol  5,000 Units Oral Daily  . FLUoxetine  20 mg Oral Daily  . furosemide  40 mg Intravenous BID  . hydrocortisone-pramoxine   Rectal BID  . levothyroxine  75 mcg Oral QAC breakfast  . pantoprazole  40 mg Oral Daily  . potassium chloride SA  20 mEq Oral Daily  . simvastatin  40 mg Oral q1800  . sodium chloride flush  3 mL Intravenous Q12H  . spironolactone  50 mg Oral BID   Continuous Infusions: . sodium chloride      Procedures/Studies: Dg Chest 2 View  Result Date: 07/23/2017 CLINICAL DATA:  Left-side chest pain and shortness of breath since yesterday. EXAM: CHEST - 2 VIEW COMPARISON:  PA and lateral chest 06/29/2017 and 06/09/2017. CT chest 08/18/2012. FINDINGS: Lung volumes are low as on the prior examinations but the lungs are clear. Heart size is enlarged. No pneumothorax or pleural fluid. Aortic atherosclerosis is noted. IMPRESSION:  No acute disease in a low volume chest. Cardiomegaly. Atherosclerosis. Electronically Signed   By: Inge Rise M.D.   On: 07/23/2017 16:58   Dg Chest 2 View  Result Date: 06/29/2017 CLINICAL DATA:  Dyspnea EXAM: CHEST - 2 VIEW COMPARISON:  06/09/2017 chest radiograph. FINDINGS: Low lung volumes. Stable cardiomediastinal silhouette with normal heart size. No pneumothorax. No pleural effusion. No overt pulmonary edema. Mild bibasilar atelectasis. No acute consolidative airspace disease. IMPRESSION: Low lung volumes with mild bibasilar atelectasis. Electronically Signed   By: Ilona Sorrel M.D.   On: 06/29/2017 23:28   US Paracentesis  Result Date: 07/24/2017 INDICATION: Nonalcoholic micronodular cirrhosis of the liver, ascites EXAM: ULTRASOUND GUIDED DIAGNOSTIC AND THERAPEUTIC PARACENTESIS MEDICATIONS: None. COMPLICATIONS: None immediate. PROCEDURE: Procedure, benefits, and risks of procedure were discussed with patient. Written informed consent for procedure was obtained.  Time out protocol followed. Adequate collection of ascites localized by ultrasound in LEFT lower quadrant. Skin prepped and draped in usual sterile fashion. Skin and soft tissues anesthetized with 10 mL of 1% lidocaine. 5 [place] Yueh catheter placed into peritoneal cavity. 2.5 L of yellow ascitic fluid aspirated by vacuum bottle suction. Procedure tolerated well by patient without immediate complication. FINDINGS: A total of approximately 2.5 L of ascitic fluid was removed. Samples were sent to the laboratory as requested by the clinical team. IMPRESSION: Successful ultrasound-guided paracentesis yielding 2.5 liters of peritoneal fluid. Electronically Signed   By: Lavonia Dana M.D.   On: 07/24/2017 16:17   US Paracentesis  Result Date: 06/30/2017 INDICATION: Ascites EXAM: ULTRASOUND GUIDED DIAGNOSTIC AND THERAPEUTIC PARACENTESIS MEDICATIONS: None. COMPLICATIONS: None immediate. PROCEDURE: Procedure, benefits, and risks of procedure were discussed with patient. Written informed consent for procedure was obtained. Time out protocol followed. Adequate collection of ascites localized by ultrasound in RIGHT lower quadrant. Skin prepped and draped in usual sterile fashion. Skin and soft tissues anesthetized with 10 mL of 1% lidocaine. 5 [place] Yueh catheter placed into peritoneal cavity. 2.8 L of clear yellow fluid fluid aspirated by vacuum bottle suction. Procedure tolerated well by patient without immediate complication. FINDINGS: A total of approximately 2.8 L of ascitic fluid was removed. Samples were sent to the laboratory as requested by the clinical team. IMPRESSION: Successful ultrasound-guided paracentesis yielding 2.8 liters of peritoneal fluid. Electronically Signed   By: Lavonia Dana M.D.   On: 06/30/2017 14:13    Orson Eva, DO  [facility] Pager [phone]  If 7PM-7AM, please contact night-coverage [email or web address] Password [email or web address] 07/25/2017, 1:29 PM   LOS: 2 days

## 2017-07-25 NOTE — Plan of Care (Signed)
  Problem: Acute Rehab PT Goals(only PT should resolve) Goal: Pt Will Go Supine/Side To Sit Outcome: Progressing Flowsheets (Taken 07/25/2017 1553) Pt will go Supine/Side to Sit: with modified independence Goal: Patient Will Transfer Sit To/From Stand Outcome: Progressing Flowsheets (Taken 07/25/2017 1553) Patient will transfer sit to/from stand: with modified independence Goal: Pt Will Transfer Bed To Chair/Chair To Bed Outcome: Progressing Flowsheets (Taken 07/25/2017 1553) Pt will Transfer Bed to Chair/Chair to Bed: with modified independence Goal: Pt Will Ambulate Outcome: Progressing Flowsheets (Taken 07/25/2017 1553) Pt will Ambulate: 75 feet;with modified independence;with rolling walker  3:54 PM, 07/25/17 Lonell Grandchild, MPT Physical Therapist with [facility] [phone] office [phone] mobile phone

## 2017-07-26 ENCOUNTER — Encounter (HOSPITAL_COMMUNITY): Payer: Self-pay | Admitting: Anesthesiology

## 2017-07-26 LAB — BASIC METABOLIC PANEL
Anion gap: 7 (ref 5–15)
BUN: 17 mg/dL (ref 6–20)
CO2: 25 mmol/L (ref 22–32)
Calcium: 8.6 mg/dL — ABNORMAL LOW (ref 8.9–10.3)
Chloride: 102 mmol/L (ref 101–111)
Creatinine, Ser: 1 mg/dL (ref 0.44–1.00)
GFR calc Af Amer: >60 mL/min (ref >60)
GFR, EST NON AFRICAN AMERICAN: 53 mL/min — AB (ref >60)
GLUCOSE: 96 mg/dL (ref 65–99)
POTASSIUM: 3.8 mmol/L (ref 3.5–5.1)
Sodium: 134 mmol/L — ABNORMAL LOW (ref 135–145)

## 2017-07-26 LAB — CBC
HEMATOCRIT: 31.8 % — AB (ref 36.0–46.0)
HEMOGLOBIN: 10.3 g/dL — AB (ref 12.0–15.0)
MCH: 34.2 pg — ABNORMAL HIGH (ref 26.0–34.0)
MCHC: 32.4 g/dL (ref 30.0–36.0)
MCV: 105.6 fL — ABNORMAL HIGH (ref 78.0–100.0)
Platelets: 52 10*3/uL — ABNORMAL LOW (ref 150–400)
RBC: 3.01 MIL/uL — ABNORMAL LOW (ref 3.87–5.11)
RDW: 13.4 % (ref 11.5–15.5)
WBC: 4.3 10*3/uL (ref 4.0–10.5)

## 2017-07-26 MED ORDER — FUROSEMIDE 10 MG/ML IJ SOLN
60.0000 mg | Freq: Two times a day (BID) | INTRAMUSCULAR | Status: DC
  Administered 2017-07-26 – 2017-07-27 (×2): 60 mg via INTRAVENOUS
  Filled 2017-07-26 (×2): qty 6

## 2017-07-26 NOTE — Progress Notes (Signed)
PROGRESS NOTE  Melanie Porter FXT:[id number] DOB: 13-Dec-1940 DOA: 07/23/2017 PCP: Sharilyn Sites, MD  Brief History: 77 year old female with a history of hyperlipidemia,NASH Cirrhosis, splenomegaly, hypothyroidism presenting with worsening lower extremity edema, increasing abdominal girth, and nausea. She was seen in Dr. Oneida Alar' office on 07/23/17 and sent to ED due to anasarca. The patient was recently admitted to the hospital from 06/29/2017 through 07/29/2017 for decompensated liver cirrhosis. She had a paracentesis on 06/30/2017 removing 2.8 L. She endorses compliance with her diet as well as her medications. She has intermittent abdominal pain but denies any vomiting, hematemesis. She has had some intermittent hematochezia which she has been attributing to hemorrhoids. She denies any chest pain, shortness breath, coughing, hemoptysis. The patient was admitted for decompensated liver cirrhosis and started on IV furosemide.   Assessment/Plan: Decompensated liver cirrhosis with anasarca -Increase IV furosemideto 60mg   BID -4/25 paracentesis-->2.5L removed, albumin given -Continue Spironolactone--increase dose to 50 mg bid -06/30/2017 echo--EF 65-70%, no WMA, grade 1 DD -doubt decompensated CHF as EF is perserved -Check urine protein creatinine ratio--0.21 -daily weights -continue spironolactone to 50 mg bid  Thrombocytopenia -Secondary to liver cirrhosis -Monitor for signs of bleeding -am CBC  Hypothyroidism -continue Synthroid  Depression/anxiety -Continue fluoxetine  Hyperlipidemia -Continue statin  Hematochezia -Hgb stable -baseline Hgb ~10-11 -will likely need outpt endoscopy -am CBC    Disposition Plan: Home 4/29 or 4/30 Family Communication:NoFamily at bedside  Consultants:none  Code Status: FULL   DVT Prophylaxis: SCDs   Procedures: As Listed in Progress Note  Above  Antibiotics: None      Subjective: Overall, patient states that her abdomen is less swollen.  She states that her leg edema is little bit better.  She denies any nausea, vomiting, diarrhea, abdominal pain, dysuria, hematuria there is no headache or neck pain.  There is no shortness of breath.  Objective: Vitals:   07/25/17 1427 07/25/17 2126 07/26/17 0535 07/26/17 1317  BP: (!) 106/47 (!) 94/50 114/65 (!) 115/54  Pulse: 75 (!) 58 85 77  Resp: 18 18 18 20   Temp: 98.5 F (36.9 C) 98.2 F (36.8 C) 98.2 F (36.8 C) 98 F (36.7 C)  TempSrc: Oral Oral Oral Oral  SpO2: 95% 92% 93% 98%  Weight:   99.2 kg (218 lb 9.6 oz)   Height:        Intake/Output Summary (Last 24 hours) at 07/26/2017 1532 Last data filed at 07/26/2017 1200 Gross per 24 hour  Intake 720 ml  Output 1250 ml  Net -530 ml   Weight change: -4.444 kg (-9 lb 12.7 oz) Exam:   General:  Pt is alert, follows commands appropriately, not in acute distress  HEENT: No icterus, No thrush, No neck mass, [place]/AT  Cardiovascular: RRR, S1/S2, no rubs, no gallops  Respiratory: Bibasilar rales.  No wheezing.  Good air movement.  Abdomen: Soft/+BS, non tender, non distended, no guarding  Extremities: 2 + LE edema, No lymphangitis, No petechiae, No rashes, no synovitis   Data Reviewed: I have personally reviewed following labs and imaging studies Basic Metabolic Panel: Recent Labs  Lab 07/23/17 1515 07/24/17 0456 07/25/17 0505 07/26/17 0628  NA 134* 137 133* 134*  K 3.9 3.7 3.8 3.8  CL 103 104 99* 102  CO2 24 25 26 25   GLUCOSE 99 104* 89 96  BUN 13 11 14 17   CREATININE 0.81 0.74 0.82 1.00  CALCIUM 9.2 9.2 8.9 8.6*   Liver Function Tests: Recent  Labs  Lab 07/23/17 1515 07/24/17 0456  AST 50* 43*  ALT 24 21  ALKPHOS 140* 132*  BILITOT 1.8* 1.6*  PROT 6.0* 5.1*  ALBUMIN 2.4* 2.1*   Recent Labs  Lab 07/23/17 1515  LIPASE 25   No results for input(s): AMMONIA in the last 168  hours. Coagulation Profile: No results for input(s): INR, PROTIME in the last 168 hours. CBC: Recent Labs  Lab 07/23/17 1515 07/24/17 0456 07/25/17 0505 07/26/17 0628  WBC 5.8 4.7 4.1 4.3  HGB 10.8* 9.7* 10.0* 10.3*  HCT 32.5* 29.2* 30.3* 31.8*  MCV 105.2* 104.7* 105.9* 105.6*  PLT SPECIMEN CHECKED FOR CLOTS 55* 58* 52*   Cardiac Enzymes: No results for input(s): CKTOTAL, CKMB, CKMBINDEX, TROPONINI in the last 168 hours. BNP: Invalid input(s): POCBNP CBG: No results for input(s): GLUCAP in the last 168 hours. HbA1C: No results for input(s): HGBA1C in the last 72 hours. Urine analysis:    Component Value Date/Time   COLORURINE YELLOW 07/23/2017 [place] 07/23/2017 1509   LABSPEC 1.016 07/23/2017 1509   PHURINE 6.0 07/23/2017 1509   GLUCOSEU NEGATIVE 07/23/2017 1509   HGBUR NEGATIVE 07/23/2017 1509   BILIRUBINUR NEGATIVE 07/23/2017 1509   KETONESUR NEGATIVE 07/23/2017 1509   PROTEINUR NEGATIVE 07/23/2017 1509   UROBILINOGEN 0.2 07/27/2014 1220   NITRITE NEGATIVE 07/23/2017 1509   LEUKOCYTESUR NEGATIVE 07/23/2017 1509   Sepsis Labs: @LABRCNTIP (procalcitonin:4,lacticidven:4) ) Recent Results (from the past 240 hour(s))  Gram stain     Status: None   Collection Time: 07/24/17  2:50 PM  Result Value Ref Range Status   Specimen Description PERITONEAL  Final   Special Requests   Final    Blood Culture adequate volume BOTTLES DRAWN AEROBIC AND ANAEROBIC   Gram Stain   Final    NO ORGANISMS SEEN Performed at [facility] AND MONONUCLEAR Performed at [facility], [address] [place], [place] [address]    Report Status 07/24/2017 FINAL  Final  Culture, body fluid-bottle     Status: None (Preliminary result)   Collection Time: 07/24/17  2:50 PM  Result Value Ref Range Status   Specimen Description PERITONEAL  Final   Special Requests BOTTLES DRAWN AEROBIC AND ANAEROBIC 10CC  Final   Culture   Final     NO GROWTH 2 DAYS Performed at [facility], [address], [place], [place] [address]    Report Status PENDING  Incomplete     Scheduled Meds: . aspirin EC  81 mg Oral QHS  . cholecalciferol  5,000 Units Oral Daily  . FLUoxetine  20 mg Oral Daily  . furosemide  40 mg Intravenous BID  . hydrocortisone-pramoxine   Rectal BID  . levothyroxine  75 mcg Oral QAC breakfast  . pantoprazole  40 mg Oral Daily  . potassium chloride SA  20 mEq Oral Daily  . simvastatin  40 mg Oral q1800  . sodium chloride flush  3 mL Intravenous Q12H  . spironolactone  50 mg Oral BID   Continuous Infusions: . sodium chloride      Procedures/Studies: Dg Chest 2 View  Result Date: 07/23/2017 CLINICAL DATA:  Left-side chest pain and shortness of breath since yesterday. EXAM: CHEST - 2 VIEW COMPARISON:  PA and lateral chest 06/29/2017 and 06/09/2017. CT chest 08/18/2012. FINDINGS: Lung volumes are low as on the prior examinations but the lungs are clear. Heart size is enlarged. No pneumothorax or pleural fluid. Aortic atherosclerosis is noted. IMPRESSION: No  acute disease in a low volume chest. Cardiomegaly. Atherosclerosis. Electronically Signed   By: Inge Rise M.D.   On: 07/23/2017 16:58   Dg Chest 2 View  Result Date: 06/29/2017 CLINICAL DATA:  Dyspnea EXAM: CHEST - 2 VIEW COMPARISON:  06/09/2017 chest radiograph. FINDINGS: Low lung volumes. Stable cardiomediastinal silhouette with normal heart size. No pneumothorax. No pleural effusion. No overt pulmonary edema. Mild bibasilar atelectasis. No acute consolidative airspace disease. IMPRESSION: Low lung volumes with mild bibasilar atelectasis. Electronically Signed   By: Ilona Sorrel M.D.   On: 06/29/2017 23:28   US Paracentesis  Result Date: 07/24/2017 INDICATION: Nonalcoholic micronodular cirrhosis of the liver, ascites EXAM: ULTRASOUND GUIDED DIAGNOSTIC AND THERAPEUTIC PARACENTESIS MEDICATIONS: None. COMPLICATIONS: None immediate. PROCEDURE: Procedure,  benefits, and risks of procedure were discussed with patient. Written informed consent for procedure was obtained. Time out protocol followed. Adequate collection of ascites localized by ultrasound in LEFT lower quadrant. Skin prepped and draped in usual sterile fashion. Skin and soft tissues anesthetized with 10 mL of 1% lidocaine. 5 [place] Yueh catheter placed into peritoneal cavity. 2.5 L of yellow ascitic fluid aspirated by vacuum bottle suction. Procedure tolerated well by patient without immediate complication. FINDINGS: A total of approximately 2.5 L of ascitic fluid was removed. Samples were sent to the laboratory as requested by the clinical team. IMPRESSION: Successful ultrasound-guided paracentesis yielding 2.5 liters of peritoneal fluid. Electronically Signed   By: Lavonia Dana M.D.   On: 07/24/2017 16:17   US Paracentesis  Result Date: 06/30/2017 INDICATION: Ascites EXAM: ULTRASOUND GUIDED DIAGNOSTIC AND THERAPEUTIC PARACENTESIS MEDICATIONS: None. COMPLICATIONS: None immediate. PROCEDURE: Procedure, benefits, and risks of procedure were discussed with patient. Written informed consent for procedure was obtained. Time out protocol followed. Adequate collection of ascites localized by ultrasound in RIGHT lower quadrant. Skin prepped and draped in usual sterile fashion. Skin and soft tissues anesthetized with 10 mL of 1% lidocaine. 5 [place] Yueh catheter placed into peritoneal cavity. 2.8 L of clear yellow fluid fluid aspirated by vacuum bottle suction. Procedure tolerated well by patient without immediate complication. FINDINGS: A total of approximately 2.8 L of ascitic fluid was removed. Samples were sent to the laboratory as requested by the clinical team. IMPRESSION: Successful ultrasound-guided paracentesis yielding 2.8 liters of peritoneal fluid. Electronically Signed   By: Lavonia Dana M.D.   On: 06/30/2017 14:13    Orson Eva, DO  [facility] Pager [phone]  If 7PM-7AM, please  contact night-coverage [email or web address] Password [email or web address] 07/26/2017, 3:32 PM   LOS: 3 days

## 2017-07-27 LAB — BASIC METABOLIC PANEL
Anion gap: 9 (ref 5–15)
BUN: 18 mg/dL (ref 6–20)
CO2: 27 mmol/L (ref 22–32)
CREATININE: 0.95 mg/dL (ref 0.44–1.00)
Calcium: 9 mg/dL (ref 8.9–10.3)
Chloride: 101 mmol/L (ref 101–111)
GFR, EST NON AFRICAN AMERICAN: 57 mL/min — AB (ref >60)
Glucose, Bld: 88 mg/dL (ref 65–99)
Potassium: 3.8 mmol/L (ref 3.5–5.1)
SODIUM: 137 mmol/L (ref 135–145)

## 2017-07-27 LAB — MAGNESIUM: MAGNESIUM: 1.4 mg/dL — AB (ref 1.7–2.4)

## 2017-07-27 MED ORDER — FUROSEMIDE 10 MG/ML IJ SOLN
80.0000 mg | Freq: Two times a day (BID) | INTRAMUSCULAR | Status: AC
  Administered 2017-07-27 – 2017-07-28 (×3): 80 mg via INTRAVENOUS
  Filled 2017-07-27 (×3): qty 8

## 2017-07-27 MED ORDER — MAGNESIUM SULFATE 4 GM/100ML IV SOLN
4.0000 g | Freq: Once | INTRAVENOUS | Status: AC
  Administered 2017-07-27: 4 g via INTRAVENOUS
  Filled 2017-07-27: qty 100

## 2017-07-27 MED ORDER — MAGNESIUM OXIDE 400 (241.3 MG) MG PO TABS
400.0000 mg | ORAL_TABLET | Freq: Every day | ORAL | Status: DC
  Administered 2017-07-28 – 2017-07-29 (×2): 400 mg via ORAL
  Filled 2017-07-27 (×2): qty 1

## 2017-07-27 NOTE — Progress Notes (Signed)
PROGRESS NOTE  RAYNETTE ARRAS GEX:[id number] DOB: [birth year] DOA: 07/23/2017 PCP: Sharilyn Sites, MD  Brief History: 77 year old female with a history of hyperlipidemia,NASH Cirrhosis, splenomegaly, hypothyroidism presenting with worsening lower extremity edema, increasing abdominal girth, and nausea. She was seen in Dr. Oneida Alar' office on 07/23/17 and sent to ED due to anasarca. The patient was recently admitted to the hospital from 06/29/2017 through 07/29/2017 for decompensated liver cirrhosis. She had a paracentesis on 06/30/2017 removing 2.8 L. She endorses compliance with her diet as well as her medications. She has intermittent abdominal pain but denies any vomiting, hematemesis. She has had some intermittent hematochezia which she has been attributing to hemorrhoids. She denies any chest pain, shortness breath, coughing, hemoptysis. The patient was admitted for decompensated liver cirrhosis and started on IV furosemide.   Assessment/Plan: Decompensated liver cirrhosis with anasarca -Increase IV furosemideto 80mg   BID -4/25paracentesis-->2.5L removed, albumin given -Continue Spironolactone--increase dose to 50 mg bid -06/30/2017 echo--EF 65-70%, no WMA, grade 1 DD -doubt decompensated CHF as EF is perserved -Check urine protein creatinine ratio--0.21 -daily weights -continue spironolactone to 50 mg bid  Thrombocytopenia -Secondary to liver cirrhosis -Monitor for signs of bleeding -am CBC  Hypothyroidism -continue Synthroid  Depression/anxiety -Continue fluoxetine  Hyperlipidemia -Continue statin  Hematochezia -Hgb stable -baseline Hgb ~10-11 -will likely need outpt endoscopy -am CBC  Hypomagnesemia -replete  Disposition Plan: Home 4/29 or 4/30 Family Communication:Son updated on phone 4/28  Consultants:none  Code Status: FULL   DVT Prophylaxis: SCDs   Procedures: As Listed in Progress Note  Above  Antibiotics: None       Subjective: Patient states that her legs are feeling better.  She states that she is able to bend them now.  There remains swollen.  She states her abdomen is also feeling better and not as "tight".  She denies any chest pain, shortness breath, abdominal pain, dysuria, hematuria, headache, fevers, chills.  Objective: Vitals:   07/26/17 1317 07/26/17 2055 07/27/17 0620 07/27/17 1320  BP: (!) 115/54 (!) 103/44 (!) 98/43 (!) 102/53  Pulse: 77 77 77 75  Resp: 20 20 18 18   Temp: 98 F (36.7 C) 98.4 F (36.9 C) 98.4 F (36.9 C) 98.3 F (36.8 C)  TempSrc: Oral Oral Oral Oral  SpO2: 98% 95% 97% 94%  Weight:   97.8 kg (215 lb 11.2 oz)   Height:        Intake/Output Summary (Last 24 hours) at 07/27/2017 1738 Last data filed at 07/27/2017 1735 Gross per 24 hour  Intake 580 ml  Output -  Net 580 ml   Weight change: -1.315 kg (-2 lb 14.4 oz) Exam:   General:  Pt is alert, follows commands appropriately, not in acute distress  HEENT: No icterus, No thrush, No neck mass, [place]/AT  Cardiovascular: RRR, S1/S2, no rubs, no gallops  Respiratory: Fine bibasilar crackles.  No wheezing.  Good air movement.  Abdomen: Soft/+BS, non tender, non distended, no guarding+ anasarca  Extremities: 2 + LE edema, No lymphangitis, No petechiae, No rashes, no synovitis   Data Reviewed: I have personally reviewed following labs and imaging studies Basic Metabolic Panel: Recent Labs  Lab 07/23/17 1515 07/24/17 0456 07/25/17 0505 07/26/17 0628 07/27/17 0612  NA 134* 137 133* 134* 137  K 3.9 3.7 3.8 3.8 3.8  CL 103 104 99* 102 101  CO2 24 25 26 25 27   GLUCOSE 99 104* 89 96 88  BUN 13 11 14 17  18  CREATININE 0.81 0.74 0.82 1.00 0.95  CALCIUM 9.2 9.2 8.9 8.6* 9.0  MG  --   --   --   --  1.4*   Liver Function Tests: Recent Labs  Lab 07/23/17 1515 07/24/17 0456  AST 50* 43*  ALT 24 21  ALKPHOS 140* 132*  BILITOT 1.8* 1.6*  PROT 6.0* 5.1*  ALBUMIN  2.4* 2.1*   Recent Labs  Lab 07/23/17 1515  LIPASE 25   No results for input(s): AMMONIA in the last 168 hours. Coagulation Profile: No results for input(s): INR, PROTIME in the last 168 hours. CBC: Recent Labs  Lab 07/23/17 1515 07/24/17 0456 07/25/17 0505 07/26/17 0628  WBC 5.8 4.7 4.1 4.3  HGB 10.8* 9.7* 10.0* 10.3*  HCT 32.5* 29.2* 30.3* 31.8*  MCV 105.2* 104.7* 105.9* 105.6*  PLT SPECIMEN CHECKED FOR CLOTS 55* 58* 52*   Cardiac Enzymes: No results for input(s): CKTOTAL, CKMB, CKMBINDEX, TROPONINI in the last 168 hours. BNP: Invalid input(s): POCBNP CBG: No results for input(s): GLUCAP in the last 168 hours. HbA1C: No results for input(s): HGBA1C in the last 72 hours. Urine analysis:    Component Value Date/Time   COLORURINE YELLOW 07/23/2017 [place] 07/23/2017 1509   LABSPEC 1.016 07/23/2017 1509   PHURINE 6.0 07/23/2017 1509   GLUCOSEU NEGATIVE 07/23/2017 1509   HGBUR NEGATIVE 07/23/2017 1509   BILIRUBINUR NEGATIVE 07/23/2017 1509   KETONESUR NEGATIVE 07/23/2017 1509   PROTEINUR NEGATIVE 07/23/2017 1509   UROBILINOGEN 0.2 07/27/2014 1220   NITRITE NEGATIVE 07/23/2017 1509   LEUKOCYTESUR NEGATIVE 07/23/2017 1509   Sepsis Labs: @LABRCNTIP (procalcitonin:4,lacticidven:4) ) Recent Results (from the past 240 hour(s))  Gram stain     Status: None   Collection Time: 07/24/17  2:50 PM  Result Value Ref Range Status   Specimen Description PERITONEAL  Final   Special Requests   Final    Blood Culture adequate volume BOTTLES DRAWN AEROBIC AND ANAEROBIC   Gram Stain   Final    NO ORGANISMS SEEN Performed at [place] [facility] AND MONONUCLEAR Performed at [facility], [address], [place], [place] [address]    Report Status 07/24/2017 FINAL  Final  Culture, body fluid-bottle     Status: None (Preliminary result)   Collection Time: 07/24/17  2:50 PM  Result Value Ref Range Status   Specimen  Description PERITONEAL  Final   Special Requests BOTTLES DRAWN AEROBIC AND ANAEROBIC 10CC  Final   Culture   Final    NO GROWTH 3 DAYS Performed at [facility], [address] [place], [place] [address]    Report Status PENDING  Incomplete     Scheduled Meds: . aspirin EC  81 mg Oral QHS  . cholecalciferol  5,000 Units Oral Daily  . FLUoxetine  20 mg Oral Daily  . furosemide  80 mg Intravenous BID  . hydrocortisone-pramoxine   Rectal BID  . levothyroxine  75 mcg Oral QAC breakfast  . [START ON 07/28/2017] magnesium oxide  400 mg Oral Daily  . pantoprazole  40 mg Oral Daily  . potassium chloride SA  20 mEq Oral Daily  . simvastatin  40 mg Oral q1800  . sodium chloride flush  3 mL Intravenous Q12H  . spironolactone  50 mg Oral BID   Continuous Infusions: . sodium chloride      Procedures/Studies: Dg Chest 2 View  Result Date: 07/23/2017 CLINICAL DATA:  Left-side chest pain and shortness of breath since yesterday. EXAM:  CHEST - 2 VIEW COMPARISON:  PA and lateral chest 06/29/2017 and 06/09/2017. CT chest 08/18/2012. FINDINGS: Lung volumes are low as on the prior examinations but the lungs are clear. Heart size is enlarged. No pneumothorax or pleural fluid. Aortic atherosclerosis is noted. IMPRESSION: No acute disease in a low volume chest. Cardiomegaly. Atherosclerosis. Electronically Signed   By: Inge Rise M.D.   On: 07/23/2017 16:58   Dg Chest 2 View  Result Date: 06/29/2017 CLINICAL DATA:  Dyspnea EXAM: CHEST - 2 VIEW COMPARISON:  06/09/2017 chest radiograph. FINDINGS: Low lung volumes. Stable cardiomediastinal silhouette with normal heart size. No pneumothorax. No pleural effusion. No overt pulmonary edema. Mild bibasilar atelectasis. No acute consolidative airspace disease. IMPRESSION: Low lung volumes with mild bibasilar atelectasis. Electronically Signed   By: Ilona Sorrel M.D.   On: 06/29/2017 23:28   US Paracentesis  Result Date: 07/24/2017 INDICATION: Nonalcoholic  micronodular cirrhosis of the liver, ascites EXAM: ULTRASOUND GUIDED DIAGNOSTIC AND THERAPEUTIC PARACENTESIS MEDICATIONS: None. COMPLICATIONS: None immediate. PROCEDURE: Procedure, benefits, and risks of procedure were discussed with patient. Written informed consent for procedure was obtained. Time out protocol followed. Adequate collection of ascites localized by ultrasound in LEFT lower quadrant. Skin prepped and draped in usual sterile fashion. Skin and soft tissues anesthetized with 10 mL of 1% lidocaine. 5 [place] Yueh catheter placed into peritoneal cavity. 2.5 L of yellow ascitic fluid aspirated by vacuum bottle suction. Procedure tolerated well by patient without immediate complication. FINDINGS: A total of approximately 2.5 L of ascitic fluid was removed. Samples were sent to the laboratory as requested by the clinical team. IMPRESSION: Successful ultrasound-guided paracentesis yielding 2.5 liters of peritoneal fluid. Electronically Signed   By: Lavonia Dana M.D.   On: 07/24/2017 16:17   US Paracentesis  Result Date: 06/30/2017 INDICATION: Ascites EXAM: ULTRASOUND GUIDED DIAGNOSTIC AND THERAPEUTIC PARACENTESIS MEDICATIONS: None. COMPLICATIONS: None immediate. PROCEDURE: Procedure, benefits, and risks of procedure were discussed with patient. Written informed consent for procedure was obtained. Time out protocol followed. Adequate collection of ascites localized by ultrasound in RIGHT lower quadrant. Skin prepped and draped in usual sterile fashion. Skin and soft tissues anesthetized with 10 mL of 1% lidocaine. 5 [place] Yueh catheter placed into peritoneal cavity. 2.8 L of clear yellow fluid fluid aspirated by vacuum bottle suction. Procedure tolerated well by patient without immediate complication. FINDINGS: A total of approximately 2.8 L of ascitic fluid was removed. Samples were sent to the laboratory as requested by the clinical team. IMPRESSION: Successful ultrasound-guided paracentesis yielding 2.8  liters of peritoneal fluid. Electronically Signed   By: Lavonia Dana M.D.   On: 06/30/2017 14:13    Orson Eva, DO  [facility] Pager [phone]  If 7PM-7AM, please contact night-coverage [email or web address] Password [facility] 07/27/2017, 5:38 PM   LOS: 4 days

## 2017-07-28 LAB — CBC
HEMATOCRIT: 32.6 % — AB (ref 36.0–46.0)
HEMOGLOBIN: 10.8 g/dL — AB (ref 12.0–15.0)
MCH: 34.8 pg — ABNORMAL HIGH (ref 26.0–34.0)
MCHC: 33.1 g/dL (ref 30.0–36.0)
MCV: 105.2 fL — AB (ref 78.0–100.0)
Platelets: 65 10*3/uL — ABNORMAL LOW (ref 150–400)
RBC: 3.1 MIL/uL — ABNORMAL LOW (ref 3.87–5.11)
RDW: 13.5 % (ref 11.5–15.5)
WBC: 4.9 10*3/uL (ref 4.0–10.5)

## 2017-07-28 LAB — BASIC METABOLIC PANEL
Anion gap: 6 (ref 5–15)
BUN: 18 mg/dL (ref 6–20)
CHLORIDE: 101 mmol/L (ref 101–111)
CO2: 28 mmol/L (ref 22–32)
CREATININE: 0.95 mg/dL (ref 0.44–1.00)
Calcium: 9 mg/dL (ref 8.9–10.3)
GFR calc Af Amer: >60 mL/min (ref >60)
GFR calc non Af Amer: 57 mL/min — ABNORMAL LOW (ref >60)
Glucose, Bld: 94 mg/dL (ref 65–99)
POTASSIUM: 3.7 mmol/L (ref 3.5–5.1)
Sodium: 135 mmol/L (ref 135–145)

## 2017-07-28 LAB — MAGNESIUM: Magnesium: 1.7 mg/dL (ref 1.7–2.4)

## 2017-07-28 MED ORDER — MAGNESIUM SULFATE 2 GM/50ML IV SOLN
2.0000 g | Freq: Once | INTRAVENOUS | Status: AC
  Administered 2017-07-28: 2 g via INTRAVENOUS
  Filled 2017-07-28: qty 50

## 2017-07-28 MED ORDER — MAGNESIUM OXIDE 400 (241.3 MG) MG PO TABS: 400.0000 mg | ORAL_TABLET | Freq: Every day | ORAL | 1 refills | Status: AC

## 2017-07-28 MED ORDER — FUROSEMIDE 80 MG PO TABS
80.0000 mg | ORAL_TABLET | Freq: Two times a day (BID) | ORAL | Status: DC
  Administered 2017-07-29: 80 mg via ORAL
  Filled 2017-07-28: qty 1

## 2017-07-28 MED ORDER — FUROSEMIDE 20 MG PO TABS: 60.0000 mg | ORAL_TABLET | Freq: Every day | ORAL | Status: DC

## 2017-07-28 MED ORDER — FUROSEMIDE 80 MG PO TABS: 80.0000 mg | ORAL_TABLET | Freq: Two times a day (BID) | ORAL | 1 refills | Status: AC

## 2017-07-28 NOTE — Discharge Summary (Signed)
Physician Discharge Summary  Melanie Porter RKY:[id number] DOB: 07-29-1940 DOA: 07/23/2017  PCP: Sharilyn Sites, MD  Admit date: 07/23/2017 Discharge date: 07/29/17  Admitted From: Home Disposition:  Home   Recommendations for Outpatient Follow-up:  1. Follow up with PCP in 1-2 weeks 2. Please obtain BMP/CBC in one week   Home Health: YES Equipment/Devices:HHPT  Discharge Condition: Stable CODE STATUS:FULL Diet recommendation: Heart Healthy    Brief/Interim Summary: 77 year old female with a history of hyperlipidemia,NASH Cirrhosis, splenomegaly, hypothyroidism presenting with worsening lower extremity edema, increasing abdominal girth, and nausea. She was seen in Dr. Oneida Alar' office on 07/23/17 and sent to ED due to anasarca. The patient was recently admitted to the hospital from 06/29/2017 through 07/29/2017 for decompensated liver cirrhosis. She had a paracentesis on 06/30/2017 removing 2.8 L. She endorses compliance with her diet as well as her medications. She has intermittent abdominal pain but denies any vomiting, hematemesis. She has had some intermittent hematochezia which she has been attributing to hemorrhoids. She denies any chest pain, shortness breath, coughing, hemoptysis. The patient was admitted for decompensated liver cirrhosis and started on IV furosemide.    Discharge Diagnoses:  Decompensated liver cirrhosis with anasarca -IncreaseIV furosemideto80mg  BID>>>home with po lasix 60 mg bid -4/25paracentesis-->2.5L removed, albumin given -Continue Spironolactone--increase dose to 50 mg bid -06/30/2017 echo--EF 65-70%, no WMA, grade 1 DD -doubt decompensated CHF as EF is perserved -Check urine protein creatinine ratio--0.21 -daily weights--NEG 18 lbs for this admission -discharge weight 210 lbs -increase maintenance dose Lasix to 80 mg po bid  Thrombocytopenia -Secondary to liver cirrhosis -Monitor for signs of bleeding -overall  stable  Hypothyroidism -continue Synthroid  Depression/anxiety -Continue fluoxetine  Hyperlipidemia -Continue statin  Hematochezia -Hgb stable -baseline Hgb ~10-11 -will likely need outpt endoscopy -Hgb 10.8 on day of d/c -no hematochezia during the hospitalization  Hypomagnesemia -repleted  Hypokalemia -repleted -d/c with KCl 20 mgEq daily   Discharge Instructions   Allergies as of 07/29/2017   No Known Allergies     Medication List    TAKE these medications   aspirin EC 81 MG tablet Take 81 mg by mouth at bedtime.   FLUoxetine 20 MG capsule Commonly known as:  PROZAC Take 20 mg by mouth daily.   furosemide 80 MG tablet Commonly known as:  LASIX Take 1 tablet (80 mg total) by mouth 2 (two) times daily. What changed:    medication strength  how much to take   KLOR-CON M20 20 MEQ tablet Generic drug:  potassium chloride SA Take 20 mEq by mouth daily.   levothyroxine 75 MCG tablet Commonly known as:  SYNTHROID, LEVOTHROID Take 75 mcg by mouth daily before breakfast.   lidocaine-hydrocortisone 3-0.5 % Crea Commonly known as:  ANAMANTEL HC Apply to the external hemorrhoid area 4 times daily as needed   loratadine-pseudoephedrine 10-240 MG 24 hr tablet Commonly known as:  CLARITIN-D 24-hour Take 1 tablet by mouth daily as needed for allergies.   magnesium oxide 400 (241.3 Mg) MG tablet Commonly known as:  MAG-OX Take 1 tablet (400 mg total) by mouth daily.   pantoprazole 40 MG tablet Commonly known as:  PROTONIX Take 1 tablet (40 mg total) by mouth daily.   simvastatin 40 MG tablet Commonly known as:  ZOCOR Take 40 mg by mouth daily.   spironolactone 50 MG tablet Commonly known as:  ALDACTONE Take 1 tablet (50 mg total) by mouth 2 (two) times daily. What changed:    when to take this  additional instructions  SYSTANE BALANCE 0.6 % Soln Generic drug:  Propylene Glycol Apply 1 drop to eye daily as needed. Dry Eyes   Vitamin  D3 5000 units Caps Take 1 capsule daily by mouth.       No Known Allergies  Consultations:  none   Procedures/Studies: Dg Chest 2 View  Result Date: 07/23/2017 CLINICAL DATA:  Left-side chest pain and shortness of breath since yesterday. EXAM: CHEST - 2 VIEW COMPARISON:  PA and lateral chest 06/29/2017 and 06/09/2017. CT chest 08/18/2012. FINDINGS: Lung volumes are low as on the prior examinations but the lungs are clear. Heart size is enlarged. No pneumothorax or pleural fluid. Aortic atherosclerosis is noted. IMPRESSION: No acute disease in a low volume chest. Cardiomegaly. Atherosclerosis. Electronically Signed   By: Inge Rise M.D.   On: 07/23/2017 16:58   Dg Chest 2 View  Result Date: 06/29/2017 CLINICAL DATA:  Dyspnea EXAM: CHEST - 2 VIEW COMPARISON:  06/09/2017 chest radiograph. FINDINGS: Low lung volumes. Stable cardiomediastinal silhouette with normal heart size. No pneumothorax. No pleural effusion. No overt pulmonary edema. Mild bibasilar atelectasis. No acute consolidative airspace disease. IMPRESSION: Low lung volumes with mild bibasilar atelectasis. Electronically Signed   By: Ilona Sorrel M.D.   On: 06/29/2017 23:28   US Paracentesis  Result Date: 07/24/2017 INDICATION: Nonalcoholic micronodular cirrhosis of the liver, ascites EXAM: ULTRASOUND GUIDED DIAGNOSTIC AND THERAPEUTIC PARACENTESIS MEDICATIONS: None. COMPLICATIONS: None immediate. PROCEDURE: Procedure, benefits, and risks of procedure were discussed with patient. Written informed consent for procedure was obtained. Time out protocol followed. Adequate collection of ascites localized by ultrasound in LEFT lower quadrant. Skin prepped and draped in usual sterile fashion. Skin and soft tissues anesthetized with 10 mL of 1% lidocaine. 5 [place] Yueh catheter placed into peritoneal cavity. 2.5 L of yellow ascitic fluid aspirated by vacuum bottle suction. Procedure tolerated well by patient without immediate  complication. FINDINGS: A total of approximately 2.5 L of ascitic fluid was removed. Samples were sent to the laboratory as requested by the clinical team. IMPRESSION: Successful ultrasound-guided paracentesis yielding 2.5 liters of peritoneal fluid. Electronically Signed   By: Lavonia Dana M.D.   On: 07/24/2017 16:17   US Paracentesis  Result Date: 06/30/2017 INDICATION: Ascites EXAM: ULTRASOUND GUIDED DIAGNOSTIC AND THERAPEUTIC PARACENTESIS MEDICATIONS: None. COMPLICATIONS: None immediate. PROCEDURE: Procedure, benefits, and risks of procedure were discussed with patient. Written informed consent for procedure was obtained. Time out protocol followed. Adequate collection of ascites localized by ultrasound in RIGHT lower quadrant. Skin prepped and draped in usual sterile fashion. Skin and soft tissues anesthetized with 10 mL of 1% lidocaine. 5 [place] Yueh catheter placed into peritoneal cavity. 2.8 L of clear yellow fluid fluid aspirated by vacuum bottle suction. Procedure tolerated well by patient without immediate complication. FINDINGS: A total of approximately 2.8 L of ascitic fluid was removed. Samples were sent to the laboratory as requested by the clinical team. IMPRESSION: Successful ultrasound-guided paracentesis yielding 2.8 liters of peritoneal fluid. Electronically Signed   By: Lavonia Dana M.D.   On: 06/30/2017 14:13        Discharge Exam: Vitals:   07/28/17 2020 07/29/17 0554  BP: (!) 106/51 (!) 101/48  Pulse: 77 81  Resp: 18   Temp: 98.5 F (36.9 C) 98.1 F (36.7 C)  SpO2: 96% 94%   Vitals:   07/28/17 1523 07/28/17 2020 07/29/17 0554 07/29/17 1000  BP: (!) 97/47 (!) 106/51 (!) 101/48   Pulse: 79 77 81   Resp: 16 18    Temp:  98.3 F (36.8 C) 98.5 F (36.9 C) 98.1 F (36.7 C)   TempSrc: Oral Oral Oral   SpO2: 95% 96% 94%   Weight:    95.3 kg (210 lb)  Height:        General: Pt is alert, awake, not in acute distress Cardiovascular: RRR, S1/S2 +, no rubs, no  gallops Respiratory: CTA bilaterally, no wheezing, no rhonchi Abdominal: Soft, NT, ND, bowel sounds + Extremities: 2+LE edema, no cyanosis   The results of significant diagnostics from this hospitalization (including imaging, microbiology, ancillary and laboratory) are listed below for reference.    Significant Diagnostic Studies: Dg Chest 2 View  Result Date: 07/23/2017 CLINICAL DATA:  Left-side chest pain and shortness of breath since yesterday. EXAM: CHEST - 2 VIEW COMPARISON:  PA and lateral chest 06/29/2017 and 06/09/2017. CT chest 08/18/2012. FINDINGS: Lung volumes are low as on the prior examinations but the lungs are clear. Heart size is enlarged. No pneumothorax or pleural fluid. Aortic atherosclerosis is noted. IMPRESSION: No acute disease in a low volume chest. Cardiomegaly. Atherosclerosis. Electronically Signed   By: Inge Rise M.D.   On: 07/23/2017 16:58   Dg Chest 2 View  Result Date: 06/29/2017 CLINICAL DATA:  Dyspnea EXAM: CHEST - 2 VIEW COMPARISON:  06/09/2017 chest radiograph. FINDINGS: Low lung volumes. Stable cardiomediastinal silhouette with normal heart size. No pneumothorax. No pleural effusion. No overt pulmonary edema. Mild bibasilar atelectasis. No acute consolidative airspace disease. IMPRESSION: Low lung volumes with mild bibasilar atelectasis. Electronically Signed   By: Ilona Sorrel M.D.   On: 06/29/2017 23:28   US Paracentesis  Result Date: 07/24/2017 INDICATION: Nonalcoholic micronodular cirrhosis of the liver, ascites EXAM: ULTRASOUND GUIDED DIAGNOSTIC AND THERAPEUTIC PARACENTESIS MEDICATIONS: None. COMPLICATIONS: None immediate. PROCEDURE: Procedure, benefits, and risks of procedure were discussed with patient. Written informed consent for procedure was obtained. Time out protocol followed. Adequate collection of ascites localized by ultrasound in LEFT lower quadrant. Skin prepped and draped in usual sterile fashion. Skin and soft tissues anesthetized with  10 mL of 1% lidocaine. 5 [place] Yueh catheter placed into peritoneal cavity. 2.5 L of yellow ascitic fluid aspirated by vacuum bottle suction. Procedure tolerated well by patient without immediate complication. FINDINGS: A total of approximately 2.5 L of ascitic fluid was removed. Samples were sent to the laboratory as requested by the clinical team. IMPRESSION: Successful ultrasound-guided paracentesis yielding 2.5 liters of peritoneal fluid. Electronically Signed   By: Lavonia Dana M.D.   On: 07/24/2017 16:17   US Paracentesis  Result Date: 06/30/2017 INDICATION: Ascites EXAM: ULTRASOUND GUIDED DIAGNOSTIC AND THERAPEUTIC PARACENTESIS MEDICATIONS: None. COMPLICATIONS: None immediate. PROCEDURE: Procedure, benefits, and risks of procedure were discussed with patient. Written informed consent for procedure was obtained. Time out protocol followed. Adequate collection of ascites localized by ultrasound in RIGHT lower quadrant. Skin prepped and draped in usual sterile fashion. Skin and soft tissues anesthetized with 10 mL of 1% lidocaine. 5 [place] Yueh catheter placed into peritoneal cavity. 2.8 L of clear yellow fluid fluid aspirated by vacuum bottle suction. Procedure tolerated well by patient without immediate complication. FINDINGS: A total of approximately 2.8 L of ascitic fluid was removed. Samples were sent to the laboratory as requested by the clinical team. IMPRESSION: Successful ultrasound-guided paracentesis yielding 2.8 liters of peritoneal fluid. Electronically Signed   By: Lavonia Dana M.D.   On: 06/30/2017 14:13     Microbiology: Recent Results (from the past 240 hour(s))  Gram stain     Status: None  Collection Time: 07/24/17  2:50 PM  Result Value Ref Range Status   Specimen Description PERITONEAL  Final   Special Requests   Final    Blood Culture adequate volume BOTTLES DRAWN AEROBIC AND ANAEROBIC   Gram Stain   Final    NO ORGANISMS SEEN Performed at [facility] CYTOSPIN  SMEAR WBC PRESENT,BOTH PMN AND MONONUCLEAR Performed at [facility], [address], [place], [place] [address]    Report Status 07/24/2017 FINAL  Final  Culture, body fluid-bottle     Status: None   Collection Time: 07/24/17  2:50 PM  Result Value Ref Range Status   Specimen Description PERITONEAL  Final   Special Requests BOTTLES DRAWN AEROBIC AND ANAEROBIC 10CC  Final   Culture   Final    NO GROWTH 5 DAYS Performed at [facility], [address], [place], [place] [address]    Report Status 07/29/2017 FINAL  Final     Labs: Basic Metabolic Panel: Recent Labs  Lab 07/25/17 0505 07/26/17 0628 07/27/17 0612 07/28/17 0556 07/29/17 0545  NA 133* 134* 137 135 134*  K 3.8 3.8 3.8 3.7 3.9  CL 99* 102 101 101 97*  CO2 26 25 27 28 28   GLUCOSE 89 96 88 94 100*  BUN 14 17 18 18 17   CREATININE 0.82 1.00 0.95 0.95 0.99  CALCIUM 8.9 8.6* 9.0 9.0 9.1  MG  --   --  1.4* 1.7 1.7   Liver Function Tests: Recent Labs  Lab 07/23/17 1515 07/24/17 0456  AST 50* 43*  ALT 24 21  ALKPHOS 140* 132*  BILITOT 1.8* 1.6*  PROT 6.0* 5.1*  ALBUMIN 2.4* 2.1*   Recent Labs  Lab 07/23/17 1515  LIPASE 25   No results for input(s): AMMONIA in the last 168 hours. CBC: Recent Labs  Lab 07/23/17 1515 07/24/17 0456 07/25/17 0505 07/26/17 0628 07/28/17 0556  WBC 5.8 4.7 4.1 4.3 4.9  HGB 10.8* 9.7* 10.0* 10.3* 10.8*  HCT 32.5* 29.2* 30.3* 31.8* 32.6*  MCV 105.2* 104.7* 105.9* 105.6* 105.2*  PLT SPECIMEN CHECKED FOR CLOTS 55* 58* 52* 65*   Cardiac Enzymes: No results for input(s): CKTOTAL, CKMB, CKMBINDEX, TROPONINI in the last 168 hours. BNP: Invalid input(s): POCBNP CBG: No results for input(s): GLUCAP in the last 168 hours.  Time coordinating discharge:  36 minutes  Signed:  Orson Eva, DO [facility] Pager: [phone] 07/29/2017, 3:12 PM

## 2017-07-28 NOTE — Progress Notes (Signed)
PROGRESS NOTE  Melanie Porter PPI:[id number] DOB: [birth year] DOA: 07/23/2017 PCP: Sharilyn Sites, MD Brief History: 77 year old female with a history of hyperlipidemia,NASH Cirrhosis, splenomegaly, hypothyroidism presenting with worsening lower extremity edema, increasing abdominal girth, and nausea. She was seen in Dr. Oneida Alar' office on 07/23/17 and sent to ED due to anasarca. The patient was recently admitted to the hospital from 06/29/2017 through 07/29/2017 for decompensated liver cirrhosis. She had a paracentesis on 06/30/2017 removing 2.8 L. She endorses compliance with her diet as well as her medications. She has intermittent abdominal pain but denies any vomiting, hematemesis. She has had some intermittent hematochezia which she has been attributing to hemorrhoids. She denies any chest pain, shortness breath, coughing, hemoptysis. The patient was admitted for decompensated liver cirrhosis and started on IV furosemide.   Assessment/Plan: Decompensated liver cirrhosis with anasarca -IncreaseIV furosemideto 80mg  BID>>>home with po lasix 60 mg bid -4/25paracentesis-->2.5L removed, albumin given -Continue Spironolactone--increase dose to 50 mg bid -06/30/2017 echo--EF 65-70%, no WMA, grade 1 DD -doubt decompensated CHF as EF is perserved -Check urine protein creatinine ratio--0.21 -daily weights -continuespironolactone to 50 mg bid  Thrombocytopenia -Secondary to liver cirrhosis -Monitor for signs of bleeding -am CBC  Hypothyroidism -continue Synthroid  Depression/anxiety -Continue fluoxetine  Hyperlipidemia -Continue statin  Hematochezia -Hgb stable -baseline Hgb ~10-11 -will likely need outpt endoscopy -am CBC  Hypomagnesemia -replete  Disposition Plan: Home 4/29 or 4/30 Family Communication:Son updated on phone 4/28  Consultants:none  Code Status: FULL   DVT Prophylaxis: SCDs   Procedures: As Listed in Progress Note  Above  Antibiotics: None      Subjective: Patient denies fevers, chills, headache, chest pain, dyspnea, nausea, vomiting, diarrhea, abdominal pain, dysuria, hematuria, hematochezia, and melena.  Patient feels that her abdomen is is less tight, and there is less lower extremity edema.  She denies any headache or neck pain.   Objective: Vitals:   07/27/17 1320 07/27/17 2052 07/28/17 0349 07/28/17 1523  BP: (!) 102/53 (!) 115/54 (!) 112/43 (!) 97/47  Pulse: 75 79 77 79  Resp: 18 18  16   Temp: 98.3 F (36.8 C) 98.3 F (36.8 C) 98.5 F (36.9 C) 98.3 F (36.8 C)  TempSrc: Oral Oral Oral Oral  SpO2: 94% 93% 94% 95%  Weight:   96.1 kg (211 lb 12.8 oz)   Height:        Intake/Output Summary (Last 24 hours) at 07/28/2017 1640 Last data filed at 07/28/2017 1500 Gross per 24 hour  Intake 460 ml  Output -  Net 460 ml   Weight change: -1.769 kg (-3 lb 14.4 oz) Exam:   General:  Pt is alert, follows commands appropriately, not in acute distress  HEENT: No icterus, No thrush, No neck mass, [place]/AT  Cardiovascular: RRR, S1/S2, no rubs, no gallops  Respiratory: Bibasilar rales.  No wheeze, no wheezing, no crackles, no rhonchi  Abdomen: Soft/+BS, non tender, non distended, no guarding  Extremities: 2 + LE edema, No lymphangitis, No petechiae, No rashes, no synovitis   Data Reviewed: I have personally reviewed following labs and imaging studies Basic Metabolic Panel: Recent Labs  Lab 07/24/17 0456 07/25/17 0505 07/26/17 0628 07/27/17 0612 07/28/17 0556  NA 137 133* 134* 137 135  K 3.7 3.8 3.8 3.8 3.7  CL 104 99* 102 101 101  CO2 25 26 25 27 28   GLUCOSE 104* 89 96 88 94  BUN 11 14 17 18 18   CREATININE 0.74 0.82 1.00 0.95  0.95  CALCIUM 9.2 8.9 8.6* 9.0 9.0  MG  --   --   --  1.4* 1.7   Liver Function Tests: Recent Labs  Lab 07/23/17 1515 07/24/17 0456  AST 50* 43*  ALT 24 21  ALKPHOS 140* 132*  BILITOT 1.8* 1.6*  PROT 6.0* 5.1*  ALBUMIN 2.4* 2.1*   Recent  Labs  Lab 07/23/17 1515  LIPASE 25   No results for input(s): AMMONIA in the last 168 hours. Coagulation Profile: No results for input(s): INR, PROTIME in the last 168 hours. CBC: Recent Labs  Lab 07/23/17 1515 07/24/17 0456 07/25/17 0505 07/26/17 0628 07/28/17 0556  WBC 5.8 4.7 4.1 4.3 4.9  HGB 10.8* 9.7* 10.0* 10.3* 10.8*  HCT 32.5* 29.2* 30.3* 31.8* 32.6*  MCV 105.2* 104.7* 105.9* 105.6* 105.2*  PLT SPECIMEN CHECKED FOR CLOTS 55* 58* 52* 65*   Cardiac Enzymes: No results for input(s): CKTOTAL, CKMB, CKMBINDEX, TROPONINI in the last 168 hours. BNP: Invalid input(s): POCBNP CBG: No results for input(s): GLUCAP in the last 168 hours. HbA1C: No results for input(s): HGBA1C in the last 72 hours. Urine analysis:    Component Value Date/Time   COLORURINE YELLOW 07/23/2017 [place] 07/23/2017 1509   LABSPEC 1.016 07/23/2017 1509   PHURINE 6.0 07/23/2017 1509   GLUCOSEU NEGATIVE 07/23/2017 1509   HGBUR NEGATIVE 07/23/2017 1509   BILIRUBINUR NEGATIVE 07/23/2017 1509   KETONESUR NEGATIVE 07/23/2017 1509   PROTEINUR NEGATIVE 07/23/2017 1509   UROBILINOGEN 0.2 07/27/2014 1220   NITRITE NEGATIVE 07/23/2017 1509   LEUKOCYTESUR NEGATIVE 07/23/2017 1509   Sepsis Labs: @LABRCNTIP (procalcitonin:4,lacticidven:4) ) Recent Results (from the past 240 hour(s))  Gram stain     Status: None   Collection Time: 07/24/17  2:50 PM  Result Value Ref Range Status   Specimen Description PERITONEAL  Final   Special Requests   Final    Blood Culture adequate volume BOTTLES DRAWN AEROBIC AND ANAEROBIC   Gram Stain   Final    NO ORGANISMS SEEN Performed at [facility] PMN AND MONONUCLEAR Performed at [facility], [address], [place], [place] [address]    Report Status 07/24/2017 FINAL  Final  Culture, body fluid-bottle     Status: None (Preliminary result)   Collection Time: 07/24/17  2:50 PM  Result Value Ref Range Status     Specimen Description PERITONEAL  Final   Special Requests BOTTLES DRAWN AEROBIC AND ANAEROBIC 10CC  Final   Culture   Final    NO GROWTH 4 DAYS Performed at [facility], [address], [place],  [address]    Report Status PENDING  Incomplete     Scheduled Meds: . aspirin EC  81 mg Oral QHS  . cholecalciferol  5,000 Units Oral Daily  . FLUoxetine  20 mg Oral Daily  . furosemide  80 mg Intravenous BID  . hydrocortisone-pramoxine   Rectal BID  . levothyroxine  75 mcg Oral QAC breakfast  . magnesium oxide  400 mg Oral Daily  . pantoprazole  40 mg Oral Daily  . potassium chloride SA  20 mEq Oral Daily  . simvastatin  40 mg Oral q1800  . sodium chloride flush  3 mL Intravenous Q12H  . spironolactone  50 mg Oral BID   Continuous Infusions: . sodium chloride      Procedures/Studies: Dg Chest 2 View  Result Date: 07/23/2017 CLINICAL DATA:  Left-side chest pain and shortness of breath since yesterday. EXAM: CHEST -  2 VIEW COMPARISON:  PA and lateral chest 06/29/2017 and 06/09/2017. CT chest 08/18/2012. FINDINGS: Lung volumes are low as on the prior examinations but the lungs are clear. Heart size is enlarged. No pneumothorax or pleural fluid. Aortic atherosclerosis is noted. IMPRESSION: No acute disease in a low volume chest. Cardiomegaly. Atherosclerosis. Electronically Signed   By: Inge Rise M.D.   On: 07/23/2017 16:58   Dg Chest 2 View  Result Date: 06/29/2017 CLINICAL DATA:  Dyspnea EXAM: CHEST - 2 VIEW COMPARISON:  06/09/2017 chest radiograph. FINDINGS: Low lung volumes. Stable cardiomediastinal silhouette with normal heart size. No pneumothorax. No pleural effusion. No overt pulmonary edema. Mild bibasilar atelectasis. No acute consolidative airspace disease. IMPRESSION: Low lung volumes with mild bibasilar atelectasis. Electronically Signed   By: Ilona Sorrel M.D.   On: 06/29/2017 23:28   US Paracentesis  Result Date: 07/24/2017 INDICATION: Nonalcoholic  micronodular cirrhosis of the liver, ascites EXAM: ULTRASOUND GUIDED DIAGNOSTIC AND THERAPEUTIC PARACENTESIS MEDICATIONS: None. COMPLICATIONS: None immediate. PROCEDURE: Procedure, benefits, and risks of procedure were discussed with patient. Written informed consent for procedure was obtained. Time out protocol followed. Adequate collection of ascites localized by ultrasound in LEFT lower quadrant. Skin prepped and draped in usual sterile fashion. Skin and soft tissues anesthetized with 10 mL of 1% lidocaine. 5 [place] Yueh catheter placed into peritoneal cavity. 2.5 L of yellow ascitic fluid aspirated by vacuum bottle suction. Procedure tolerated well by patient without immediate complication. FINDINGS: A total of approximately 2.5 L of ascitic fluid was removed. Samples were sent to the laboratory as requested by the clinical team. IMPRESSION: Successful ultrasound-guided paracentesis yielding 2.5 liters of peritoneal fluid. Electronically Signed   By: Lavonia Dana M.D.   On: 07/24/2017 16:17   US Paracentesis  Result Date: 06/30/2017 INDICATION: Ascites EXAM: ULTRASOUND GUIDED DIAGNOSTIC AND THERAPEUTIC PARACENTESIS MEDICATIONS: None. COMPLICATIONS: None immediate. PROCEDURE: Procedure, benefits, and risks of procedure were discussed with patient. Written informed consent for procedure was obtained. Time out protocol followed. Adequate collection of ascites localized by ultrasound in RIGHT lower quadrant. Skin prepped and draped in usual sterile fashion. Skin and soft tissues anesthetized with 10 mL of 1% lidocaine. 5 [place] Yueh catheter placed into peritoneal cavity. 2.8 L of clear yellow fluid fluid aspirated by vacuum bottle suction. Procedure tolerated well by patient without immediate complication. FINDINGS: A total of approximately 2.8 L of ascitic fluid was removed. Samples were sent to the laboratory as requested by the clinical team. IMPRESSION: Successful ultrasound-guided paracentesis yielding 2.8  liters of peritoneal fluid. Electronically Signed   By: Lavonia Dana M.D.   On: 06/30/2017 14:13    Orson Eva, DO  [facility] Pager [phone]  If 7PM-7AM, please contact night-coverage [email or web address] Password [email or web address] 07/28/2017, 4:40 PM   LOS: 5 days

## 2017-07-28 NOTE — Care Management Important Message (Signed)
Important Message  Patient Details  Name: Melanie Porter MRN: [id number] Date of Birth: [birth year]   Medicare Important Message Given:  Yes    Shelda Altes 07/28/2017, 11:58 AM

## 2017-07-28 NOTE — Progress Notes (Signed)
Physical Therapy Treatment Patient Details Name: SHAQUIRA MOROZ MRN: [id number] DOB: 28-Oct-1940 Today's Date: 07/28/2017    History of Present Illness MEGYN LENG is a 77 y.o. female with medical history significant for NASH cirrhosis with ascites and recent paracentesis on 06/30/2017 with removal of 2.8 L of fluid who was seen in  at Dr. Oneida Alar office by Walden Field NP today for complaints of abdominal pain as well as some nausea and decreased oral intake.  Patient was noted to have anasarca and ascites for which she was recommended to come to the emergency department for further evaluation and paracentesis.  She has also had some dark stools recently and was recommended to have EGD in the outpatient setting after paracentesis during her visit and GI earlier today.  She states that she has been taking her home Lasix and spironolactone is otherwise recommended and has been careful with her fluid intake otherwise.    PT Comments    Pt in chair upon entering and eager to get up and move around.  Pt completes all transfers modified independent for increased time.  Pt ambulated over 100 feet without noted distress, fatigue.  Exercises completed following short rest including seated and standing LE therex.  Pt able to complete all without rest breaks or noted difficulty.  Pt returned to recliner following treatment.     Follow Up Recommendations              Precautions / Restrictions Restrictions Weight Bearing Restrictions: No    Mobility  Bed Mobility                  Transfers Overall transfer level: Modified independent     Sit to Stand: Modified independent (Device/Increase time)            Ambulation/Gait Ambulation/Gait assistance: Modified independent (Device/Increase time) Ambulation Distance (Feet): 125 Feet Assistive device: Rolling walker (2 wheeled) Gait Pattern/deviations: Decreased step length - right;Decreased step length - left;Decreased stride length     General Gait Details: slow, careful gait with decreased stride.  No LOB        Cognition Arousal/Alertness: Awake/alert Behavior During Therapy: WFL for tasks assessed/performed Overall Cognitive Status: Within Functional Limits for tasks assessed                                        Exercises General Exercises - Lower Extremity Ankle Circles/Pumps: AROM;Both;10 reps;Seated Long Arc Quad: AROM;Both;10 reps;Seated Hip Flexion/Marching: AROM;Both;10 reps;Standing Toe Raises: AROM;Both;10 reps;Standing Heel Raises: AROM;Both;10 reps;Standing    General Comments        Pertinent Vitals/Pain Pain Assessment: No/denies pain        PT Goals (current goals can now be found in the care plan section) Acute Rehab PT Goals Patient Stated Goal: return home with to assist PRN PT Goal Formulation: With patient Potential to Achieve Goals: Good Progress towards PT goals: Progressing toward goals    Frequency           PT Plan Current plan remains appropriate          End of Session Equipment Utilized During Treatment: Gait belt Activity Tolerance: Patient tolerated treatment well Patient left: in chair;with call bell/phone within reach         Time: 1120-1150 PT Time Calculation (min) (ACUTE ONLY): 30 min  Charges:  $Gait Training: 8-22 mins $Therapeutic Exercise: 8-22 mins  Teena Irani, PTA/CLT [phone]    Roseanne Reno B 07/28/2017, 11:46 AM

## 2017-07-29 LAB — CULTURE, BODY FLUID W GRAM STAIN -BOTTLE

## 2017-07-29 LAB — BASIC METABOLIC PANEL
Anion gap: 9 (ref 5–15)
BUN: 17 mg/dL (ref 6–20)
CHLORIDE: 97 mmol/L — AB (ref 101–111)
CO2: 28 mmol/L (ref 22–32)
CREATININE: 0.99 mg/dL (ref 0.44–1.00)
Calcium: 9.1 mg/dL (ref 8.9–10.3)
GFR calc Af Amer: >60 mL/min (ref >60)
GFR calc non Af Amer: 54 mL/min — ABNORMAL LOW (ref >60)
GLUCOSE: 100 mg/dL — AB (ref 65–99)
Potassium: 3.9 mmol/L (ref 3.5–5.1)
Sodium: 134 mmol/L — ABNORMAL LOW (ref 135–145)

## 2017-07-29 LAB — MAGNESIUM: Magnesium: 1.7 mg/dL (ref 1.7–2.4)

## 2017-07-29 LAB — CULTURE, BODY FLUID-BOTTLE: CULTURE: NO GROWTH

## 2017-07-29 MED ORDER — SPIRONOLACTONE 50 MG PO TABS: 50.0000 mg | ORAL_TABLET | Freq: Two times a day (BID) | ORAL | 1 refills | Status: AC

## 2017-07-29 NOTE — Care Management Note (Signed)
Case Management Note  Patient Details  Name: Melanie Porter MRN: [id number] Date of Birth: 24-Mar-1941   If discussed at [facility] Length of Stay Meetings, dates discussed:  07/29/2017 Additional Comments:  Keondrick Dilks, Chauncey Reading, RN 07/29/2017, 12:05 PM

## 2017-07-29 NOTE — Progress Notes (Signed)
Physical Therapy Treatment Patient Details Name: Melanie Porter MRN: [id number] DOB: 07/10/1940 Today's Date: 07/29/2017    History of Present Illness Melanie Porter is a 77 y.o. female with medical history significant for NASH cirrhosis with ascites and recent paracentesis on 06/30/2017 with removal of 2.8 L of fluid who was seen in  at Dr. Darrick Penna office by Wynne Dust NP today for complaints of abdominal pain as well as some nausea and decreased oral intake.  Patient was noted to have anasarca and ascites for which she was recommended to come to the emergency department for further evaluation and paracentesis.  She has also had some dark stools recently and was recommended to have EGD in the outpatient setting after paracentesis during her visit and GI earlier today.  She states that she has been taking her home Lasix and spironolactone is otherwise recommended and has been careful with her fluid intake otherwise.    PT Comments    Patient exhibited improved endurance for activity today with increased ambulation to 300 feet with RW and supervision. Patient continues to exhibit mild balance difficulties and slow ambulation increasing fall risk.  Patient would continue to benefit from skilled physical therapy in current environment and next venue to continue return to prior function and increase strength, endurance, balance, coordination, and safety with functional mobility and gait skills.     Follow Up Recommendations        Equipment Recommendations       Recommendations for Other Services       Precautions / Restrictions Precautions Precautions: Fall Restrictions Weight Bearing Restrictions: No    Mobility  Bed Mobility               General bed mobility comments: patient sitting at EOB when therapist arrived; patient left sitting on commode at end of session.  Transfers Overall transfer level: Needs assistance Equipment used: Rolling walker (2 wheeled) Transfers: Sit  to/from [employer] Sit to Stand: Supervision Stand pivot transfers: Supervision          Ambulation/Gait Ambulation/Gait assistance: Supervision Ambulation Distance (Feet): 300 Feet Assistive device: Rolling walker (2 wheeled) Gait Pattern/deviations: Decreased step length - right;Decreased step length - left;Decreased stride length Gait velocity: reduced   General Gait Details: demonstrates slightly labored slow cadence without loss of balance   Stairs             Wheelchair Mobility    Modified Rankin (Stroke Patients Only)       Balance Overall balance assessment: Needs assistance Sitting-balance support: No upper extremity supported;Feet supported Sitting balance-Leahy Scale: Good     Standing balance support: During functional activity;Bilateral upper extremity supported Standing balance-Leahy Scale: Fair                              Cognition Arousal/Alertness: Awake/alert Behavior During Therapy: WFL for tasks assessed/performed Overall Cognitive Status: Within Functional Limits for tasks assessed                                        Exercises      General Comments        Pertinent Vitals/Pain Pain Assessment: No/denies pain    Home Living Family/patient expects to be discharged to:: Private residence Living Arrangements: Alone Available Help at Discharge: Family;Available PRN/intermittently(lives close by) Type of Home: House Home  Access: Ramped entrance;Stairs to enter Entrance Stairs-Rails: Right;Left;Can reach both(patient does not go through carport) Home Layout: One level Home Equipment: Walker - 2 wheels;Shower seat;Wheelchair - manual;Cane - single point      Prior Function Level of Independence: Independent with assistive device(s)      Comments: household distances using RW   PT Goals (current goals can now be found in the care plan section) Acute Rehab PT Goals Patient Stated  Goal: return home with assist PRN PT Goal Formulation: With patient Potential to Achieve Goals: Good Progress towards PT goals: Progressing toward goals    Frequency           PT Plan Current plan remains appropriate    Co-evaluation              AM-PAC PT "6 Clicks" Daily Activity  Outcome Measure                   End of Session Equipment Utilized During Treatment: Oxygen Activity Tolerance: Patient tolerated treatment well Patient left: Other (comment);with call bell/phone within reach(on commode; patient notified to contact nursing when ready to return to recliner) Nurse Communication: Mobility status       Time: 1610-9604 PT Time Calculation (min) (ACUTE ONLY): 25 min  Charges:  $Gait Training: 8-22 mins $Therapeutic Activity: 8-22 mins                    G Codes:       Arlethia Basso D. Hartnett-Rands, MS, PT [place] PT [facility] [place] [phone] 07/29/2017, 1:54 PM

## 2017-07-29 NOTE — Progress Notes (Signed)
Patient states understanding of discharge instructions.  

## 2017-08-04 ENCOUNTER — Telehealth: Payer: Self-pay | Admitting: Gastroenterology

## 2017-08-04 DIAGNOSIS — E039 Hypothyroidism, unspecified: Secondary | ICD-10-CM | POA: Diagnosis not present

## 2017-08-04 DIAGNOSIS — Z6837 Body mass index (BMI) 37.0-37.9, adult: Secondary | ICD-10-CM | POA: Diagnosis not present

## 2017-08-04 DIAGNOSIS — Z1389 Encounter for screening for other disorder: Secondary | ICD-10-CM | POA: Diagnosis not present

## 2017-08-04 DIAGNOSIS — D696 Thrombocytopenia, unspecified: Secondary | ICD-10-CM | POA: Diagnosis not present

## 2017-08-04 DIAGNOSIS — K760 Fatty (change of) liver, not elsewhere classified: Secondary | ICD-10-CM | POA: Diagnosis not present

## 2017-08-04 DIAGNOSIS — R601 Generalized edema: Secondary | ICD-10-CM | POA: Diagnosis not present

## 2017-08-04 NOTE — Telephone Encounter (Signed)
I called pt and she gave me her son's phone number, Jenny Reichmann @ [phone] and said it is fine to speak with him about her problems and care.

## 2017-08-04 NOTE — Telephone Encounter (Signed)
PLEASE CALL PT. SHE NEEDS TO GIVE [place] PERMISSION TO TALK TO HER SON AND I WOULD BE MOR ETHAN HAPPY TO CALL HIM.

## 2017-08-04 NOTE — Telephone Encounter (Signed)
-----   Message from Daneil Dolin, MD sent at 07/30/2017  3:51 PM EDT ----- Randall Hiss,  per our earlier telephone conversation:  My pt, Melanie Porter, who is Melanie Porter's son and POA HC , called me and wanted more info on his moms condition - specifically, asking about prospects for a liver transplant.  I recommended he discuss issues with his primary GI team since I don't know the pt.

## 2017-08-07 NOTE — Telephone Encounter (Addendum)
CALLED PT'S SON. NO VOICE MAIL AT NUMBER LEFT BY SON. PLEASE CONTACT PT TO GET AN ALTERNATIVE PH # FOR SON.

## 2017-08-11 NOTE — Telephone Encounter (Signed)
Pt said her son's cell number is [phone]. Melburn Hake).

## 2017-08-13 ENCOUNTER — Other Ambulatory Visit: Payer: Self-pay

## 2017-08-13 DIAGNOSIS — R7989 Other specified abnormal findings of blood chemistry: Secondary | ICD-10-CM

## 2017-08-17 NOTE — Telephone Encounter (Addendum)
CALLED PT'S SON TO DISCUSS HIS MOTHER'S MEDICAL MANAGEMENT.  LEFT VOICEMAIL-CALL [phone] TODAY OR TOMORROW CALL [phone] TO DISCUSS.

## 2017-08-19 DIAGNOSIS — Z6838 Body mass index (BMI) 38.0-38.9, adult: Secondary | ICD-10-CM | POA: Diagnosis not present

## 2017-08-19 DIAGNOSIS — M2550 Pain in unspecified joint: Secondary | ICD-10-CM | POA: Diagnosis not present

## 2017-08-19 DIAGNOSIS — M1991 Primary osteoarthritis, unspecified site: Secondary | ICD-10-CM | POA: Diagnosis not present

## 2017-09-01 ENCOUNTER — Encounter (HOSPITAL_COMMUNITY): Payer: Self-pay | Admitting: Emergency Medicine

## 2017-09-01 ENCOUNTER — Other Ambulatory Visit: Payer: Self-pay

## 2017-09-01 ENCOUNTER — Inpatient Hospital Stay (HOSPITAL_COMMUNITY): Payer: PPO

## 2017-09-01 ENCOUNTER — Inpatient Hospital Stay (HOSPITAL_COMMUNITY)
Admission: EM | Admit: 2017-09-01 | Discharge: 2017-09-10 | DRG: 981 | Disposition: A | Discharge status: Hospice | Payer: PPO | Source: Ambulatory Visit | Attending: Internal Medicine | Admitting: Internal Medicine

## 2017-09-01 ENCOUNTER — Telehealth: Payer: Self-pay | Admitting: Gastroenterology

## 2017-09-01 ENCOUNTER — Emergency Department (HOSPITAL_COMMUNITY): Payer: PPO

## 2017-09-01 DIAGNOSIS — R188 Other ascites: Secondary | ICD-10-CM

## 2017-09-01 DIAGNOSIS — K279 Peptic ulcer, site unspecified, unspecified as acute or chronic, without hemorrhage or perforation: Secondary | ICD-10-CM | POA: Diagnosis not present

## 2017-09-01 DIAGNOSIS — R05 Cough: Secondary | ICD-10-CM | POA: Diagnosis not present

## 2017-09-01 DIAGNOSIS — K219 Gastro-esophageal reflux disease without esophagitis: Secondary | ICD-10-CM | POA: Diagnosis present

## 2017-09-01 DIAGNOSIS — E782 Mixed hyperlipidemia: Secondary | ICD-10-CM

## 2017-09-01 DIAGNOSIS — D500 Iron deficiency anemia secondary to blood loss (chronic): Secondary | ICD-10-CM | POA: Diagnosis present

## 2017-09-01 DIAGNOSIS — K7469 Other cirrhosis of liver: Secondary | ICD-10-CM | POA: Diagnosis not present

## 2017-09-01 DIAGNOSIS — K7581 Nonalcoholic steatohepatitis (NASH): Secondary | ICD-10-CM | POA: Diagnosis not present

## 2017-09-01 DIAGNOSIS — D684 Acquired coagulation factor deficiency: Secondary | ICD-10-CM | POA: Diagnosis not present

## 2017-09-01 DIAGNOSIS — D696 Thrombocytopenia, unspecified: Secondary | ICD-10-CM | POA: Diagnosis not present

## 2017-09-01 DIAGNOSIS — D689 Coagulation defect, unspecified: Secondary | ICD-10-CM | POA: Diagnosis not present

## 2017-09-01 DIAGNOSIS — I350 Nonrheumatic aortic (valve) stenosis: Secondary | ICD-10-CM | POA: Diagnosis present

## 2017-09-01 DIAGNOSIS — R1084 Generalized abdominal pain: Secondary | ICD-10-CM | POA: Diagnosis not present

## 2017-09-01 DIAGNOSIS — M1990 Unspecified osteoarthritis, unspecified site: Secondary | ICD-10-CM | POA: Diagnosis not present

## 2017-09-01 DIAGNOSIS — K921 Melena: Secondary | ICD-10-CM | POA: Diagnosis present

## 2017-09-01 DIAGNOSIS — T82898A Other specified complication of vascular prosthetic devices, implants and grafts, initial encounter: Secondary | ICD-10-CM | POA: Diagnosis not present

## 2017-09-01 DIAGNOSIS — R197 Diarrhea, unspecified: Secondary | ICD-10-CM | POA: Diagnosis not present

## 2017-09-01 DIAGNOSIS — Y92239 Unspecified place in hospital as the place of occurrence of the external cause: Secondary | ICD-10-CM | POA: Diagnosis not present

## 2017-09-01 DIAGNOSIS — E785 Hyperlipidemia, unspecified: Secondary | ICD-10-CM | POA: Diagnosis not present

## 2017-09-01 DIAGNOSIS — K658 Other peritonitis: Secondary | ICD-10-CM | POA: Diagnosis not present

## 2017-09-01 DIAGNOSIS — K631 Perforation of intestine (nontraumatic): Secondary | ICD-10-CM

## 2017-09-01 DIAGNOSIS — B369 Superficial mycosis, unspecified: Secondary | ICD-10-CM | POA: Diagnosis present

## 2017-09-01 DIAGNOSIS — Z9049 Acquired absence of other specified parts of digestive tract: Secondary | ICD-10-CM | POA: Diagnosis not present

## 2017-09-01 DIAGNOSIS — Z7401 Bed confinement status: Secondary | ICD-10-CM | POA: Diagnosis not present

## 2017-09-01 DIAGNOSIS — E038 Other specified hypothyroidism: Secondary | ICD-10-CM

## 2017-09-01 DIAGNOSIS — K659 Peritonitis, unspecified: Secondary | ICD-10-CM | POA: Diagnosis not present

## 2017-09-01 DIAGNOSIS — N179 Acute kidney failure, unspecified: Secondary | ICD-10-CM | POA: Diagnosis not present

## 2017-09-01 DIAGNOSIS — Z66 Do not resuscitate: Secondary | ICD-10-CM | POA: Diagnosis not present

## 2017-09-01 DIAGNOSIS — E669 Obesity, unspecified: Secondary | ICD-10-CM | POA: Diagnosis present

## 2017-09-01 DIAGNOSIS — Z515 Encounter for palliative care: Secondary | ICD-10-CM | POA: Diagnosis not present

## 2017-09-01 DIAGNOSIS — K9171 Accidental puncture and laceration of a digestive system organ or structure during a digestive system procedure: Secondary | ICD-10-CM | POA: Diagnosis not present

## 2017-09-01 DIAGNOSIS — E039 Hypothyroidism, unspecified: Secondary | ICD-10-CM | POA: Diagnosis present

## 2017-09-01 DIAGNOSIS — Y848 Other medical procedures as the cause of abnormal reaction of the patient, or of later complication, without mention of misadventure at the time of the procedure: Secondary | ICD-10-CM | POA: Diagnosis not present

## 2017-09-01 DIAGNOSIS — T82524A Displacement of infusion catheter, initial encounter: Secondary | ICD-10-CM | POA: Diagnosis not present

## 2017-09-01 DIAGNOSIS — D6959 Other secondary thrombocytopenia: Secondary | ICD-10-CM | POA: Diagnosis present

## 2017-09-01 DIAGNOSIS — R601 Generalized edema: Secondary | ICD-10-CM | POA: Diagnosis not present

## 2017-09-01 DIAGNOSIS — Y730 Diagnostic and monitoring gastroenterology and urology devices associated with adverse incidents: Secondary | ICD-10-CM | POA: Diagnosis not present

## 2017-09-01 DIAGNOSIS — R110 Nausea: Secondary | ICD-10-CM | POA: Diagnosis not present

## 2017-09-01 DIAGNOSIS — R571 Hypovolemic shock: Secondary | ICD-10-CM | POA: Diagnosis not present

## 2017-09-01 DIAGNOSIS — Z8249 Family history of ischemic heart disease and other diseases of the circulatory system: Secondary | ICD-10-CM

## 2017-09-01 DIAGNOSIS — E876 Hypokalemia: Secondary | ICD-10-CM | POA: Diagnosis not present

## 2017-09-01 DIAGNOSIS — D62 Acute posthemorrhagic anemia: Secondary | ICD-10-CM | POA: Diagnosis not present

## 2017-09-01 DIAGNOSIS — Z7189 Other specified counseling: Secondary | ICD-10-CM | POA: Diagnosis not present

## 2017-09-01 DIAGNOSIS — E440 Moderate protein-calorie malnutrition: Secondary | ICD-10-CM | POA: Diagnosis not present

## 2017-09-01 DIAGNOSIS — K625 Hemorrhage of anus and rectum: Secondary | ICD-10-CM

## 2017-09-01 DIAGNOSIS — R531 Weakness: Secondary | ICD-10-CM | POA: Diagnosis not present

## 2017-09-01 DIAGNOSIS — Z6837 Body mass index (BMI) 37.0-37.9, adult: Secondary | ICD-10-CM | POA: Diagnosis not present

## 2017-09-01 DIAGNOSIS — F329 Major depressive disorder, single episode, unspecified: Secondary | ICD-10-CM | POA: Diagnosis present

## 2017-09-01 DIAGNOSIS — S15091A Other specified injury of right carotid artery, initial encounter: Secondary | ICD-10-CM | POA: Diagnosis not present

## 2017-09-01 DIAGNOSIS — R161 Splenomegaly, not elsewhere classified: Secondary | ICD-10-CM | POA: Diagnosis not present

## 2017-09-01 DIAGNOSIS — K668 Other specified disorders of peritoneum: Secondary | ICD-10-CM | POA: Diagnosis not present

## 2017-09-01 DIAGNOSIS — R0602 Shortness of breath: Secondary | ICD-10-CM | POA: Diagnosis not present

## 2017-09-01 DIAGNOSIS — K7460 Unspecified cirrhosis of liver: Secondary | ICD-10-CM | POA: Diagnosis not present

## 2017-09-01 DIAGNOSIS — Z452 Encounter for adjustment and management of vascular access device: Secondary | ICD-10-CM | POA: Diagnosis not present

## 2017-09-01 DIAGNOSIS — K7290 Hepatic failure, unspecified without coma: Secondary | ICD-10-CM | POA: Diagnosis not present

## 2017-09-01 DIAGNOSIS — M2550 Pain in unspecified joint: Secondary | ICD-10-CM | POA: Diagnosis not present

## 2017-09-01 DIAGNOSIS — Z833 Family history of diabetes mellitus: Secondary | ICD-10-CM

## 2017-09-01 DIAGNOSIS — Z7982 Long term (current) use of aspirin: Secondary | ICD-10-CM

## 2017-09-01 DIAGNOSIS — Z6835 Body mass index (BMI) 35.0-35.9, adult: Secondary | ICD-10-CM

## 2017-09-01 DIAGNOSIS — K660 Peritoneal adhesions (postprocedural) (postinfection): Secondary | ICD-10-CM | POA: Diagnosis present

## 2017-09-01 LAB — COMPREHENSIVE METABOLIC PANEL
ALK PHOS: 141 U/L — AB (ref 38–126)
ALT: 29 U/L (ref 14–54)
AST: 52 U/L — AB (ref 15–41)
Albumin: 2.4 g/dL — ABNORMAL LOW (ref 3.5–5.0)
Anion gap: 7 (ref 5–15)
BUN: 21 mg/dL — AB (ref 6–20)
CALCIUM: 8.8 mg/dL — AB (ref 8.9–10.3)
CHLORIDE: 99 mmol/L — AB (ref 101–111)
CO2: 27 mmol/L (ref 22–32)
CREATININE: 1.11 mg/dL — AB (ref 0.44–1.00)
GFR calc Af Amer: 54 mL/min — ABNORMAL LOW (ref >60)
GFR calc non Af Amer: 47 mL/min — ABNORMAL LOW (ref >60)
Glucose, Bld: 93 mg/dL (ref 65–99)
Potassium: 3.2 mmol/L — ABNORMAL LOW (ref 3.5–5.1)
Sodium: 133 mmol/L — ABNORMAL LOW (ref 135–145)
Total Bilirubin: 2.3 mg/dL — ABNORMAL HIGH (ref 0.3–1.2)
Total Protein: 6 g/dL — ABNORMAL LOW (ref 6.5–8.1)

## 2017-09-01 LAB — PHOSPHORUS: PHOSPHORUS: 3.1 mg/dL (ref 2.5–4.6)

## 2017-09-01 LAB — TSH: TSH: 3.994 u[IU]/mL (ref 0.350–4.500)

## 2017-09-01 LAB — CBC WITH DIFFERENTIAL/PLATELET
BASOS ABS: 0 10*3/uL (ref 0.0–0.1)
Basophils Relative: 0 %
EOS PCT: 0 %
Eosinophils Absolute: 0 10*3/uL (ref 0.0–0.7)
HEMATOCRIT: 35.4 % — AB (ref 36.0–46.0)
HEMOGLOBIN: 11.9 g/dL — AB (ref 12.0–15.0)
LYMPHS ABS: 1.2 10*3/uL (ref 0.7–4.0)
LYMPHS PCT: 14 %
MCH: 34.7 pg — ABNORMAL HIGH (ref 26.0–34.0)
MCHC: 33.6 g/dL (ref 30.0–36.0)
MCV: 103.2 fL — AB (ref 78.0–100.0)
Monocytes Absolute: 0.7 10*3/uL (ref 0.1–1.0)
Monocytes Relative: 9 %
NEUTROS ABS: 6.6 10*3/uL (ref 1.7–7.7)
NEUTROS PCT: 77 %
PLATELETS: 70 10*3/uL — AB (ref 150–400)
RBC: 3.43 MIL/uL — AB (ref 3.87–5.11)
RDW: 13.2 % (ref 11.5–15.5)
WBC: 8.5 10*3/uL (ref 4.0–10.5)

## 2017-09-01 LAB — PROTIME-INR
INR: 1.7
Prothrombin Time: 19.8 seconds — ABNORMAL HIGH (ref 11.4–15.2)

## 2017-09-01 LAB — MAGNESIUM: Magnesium: 1.6 mg/dL — ABNORMAL LOW (ref 1.7–2.4)

## 2017-09-01 MED ORDER — SPIRONOLACTONE 25 MG PO TABS
50.0000 mg | ORAL_TABLET | Freq: Two times a day (BID) | ORAL | Status: DC
  Administered 2017-09-02: 50 mg via ORAL
  Filled 2017-09-01: qty 2

## 2017-09-01 MED ORDER — FLUOXETINE HCL 20 MG PO CAPS: 20.0000 mg | ORAL_CAPSULE | Freq: Every day | ORAL | Status: DC

## 2017-09-01 MED ORDER — POLYETHYLENE GLYCOL 3350 17 G PO PACK
17.0000 g | PACK | ORAL | Status: AC
  Administered 2017-09-01 (×3): 17 g via ORAL
  Filled 2017-09-01 (×3): qty 1

## 2017-09-01 MED ORDER — MAGNESIUM OXIDE 400 (241.3 MG) MG PO TABS
400.0000 mg | ORAL_TABLET | Freq: Every day | ORAL | Status: DC
  Administered 2017-09-02 – 2017-09-03 (×2): 400 mg via ORAL
  Filled 2017-09-01 (×2): qty 1

## 2017-09-01 MED ORDER — LEVOTHYROXINE SODIUM 75 MCG PO TABS
75.0000 ug | ORAL_TABLET | Freq: Every day | ORAL | Status: DC
  Administered 2017-09-02 – 2017-09-05 (×4): 75 ug via ORAL
  Filled 2017-09-01 (×5): qty 1

## 2017-09-01 MED ORDER — VITAMIN K1 10 MG/ML IJ SOLN
10.0000 mg | Freq: Once | INTRAVENOUS | Status: AC
  Administered 2017-09-01: 10 mg via INTRAVENOUS
  Filled 2017-09-01: qty 1

## 2017-09-01 MED ORDER — PANTOPRAZOLE SODIUM 40 MG PO TBEC
40.0000 mg | DELAYED_RELEASE_TABLET | Freq: Every day | ORAL | Status: DC
  Administered 2017-09-02: 40 mg via ORAL
  Filled 2017-09-01: qty 1

## 2017-09-01 MED ORDER — ASPIRIN EC 81 MG PO TBEC: 81.0000 mg | DELAYED_RELEASE_TABLET | Freq: Every day | ORAL | Status: DC

## 2017-09-01 MED ORDER — ONDANSETRON HCL 4 MG PO TABS: 4.0000 mg | ORAL_TABLET | Freq: Four times a day (QID) | ORAL | Status: DC | PRN

## 2017-09-01 MED ORDER — SODIUM CHLORIDE 0.9% FLUSH: 3.0000 mL | INTRAVENOUS | Status: DC | PRN

## 2017-09-01 MED ORDER — POTASSIUM CHLORIDE CRYS ER 20 MEQ PO TBCR
20.0000 meq | EXTENDED_RELEASE_TABLET | Freq: Every day | ORAL | Status: DC
  Administered 2017-09-02: 20 meq via ORAL
  Filled 2017-09-01: qty 1

## 2017-09-01 MED ORDER — SODIUM CHLORIDE 0.9% FLUSH
3.0000 mL | Freq: Two times a day (BID) | INTRAVENOUS | Status: DC
  Administered 2017-09-01 – 2017-09-05 (×6): 3 mL via INTRAVENOUS
  Administered 2017-09-06: 10 mL via INTRAVENOUS
  Administered 2017-09-07 – 2017-09-10 (×4): 3 mL via INTRAVENOUS

## 2017-09-01 MED ORDER — SODIUM CHLORIDE 0.9 % IV SOLN
250.0000 mL | INTRAVENOUS | Status: DC | PRN
  Administered 2017-09-02: 250 mL via INTRAVENOUS

## 2017-09-01 MED ORDER — ALBUMIN HUMAN 25 % IV SOLN
50.0000 g | Freq: Four times a day (QID) | INTRAVENOUS | Status: AC
  Administered 2017-09-01 – 2017-09-02 (×3): 50 g via INTRAVENOUS
  Filled 2017-09-01 (×3): qty 200
  Filled 2017-09-01: qty 100
  Filled 2017-09-01: qty 150
  Filled 2017-09-01: qty 50

## 2017-09-01 MED ORDER — SIMVASTATIN 20 MG PO TABS
40.0000 mg | ORAL_TABLET | Freq: Every day | ORAL | Status: DC
  Administered 2017-09-02: 40 mg via ORAL
  Filled 2017-09-01: qty 2

## 2017-09-01 MED ORDER — SPIRONOLACTONE 25 MG PO TABS
50.0000 mg | ORAL_TABLET | Freq: Two times a day (BID) | ORAL | Status: DC
  Filled 2017-09-01: qty 2

## 2017-09-01 MED ORDER — FUROSEMIDE 10 MG/ML IJ SOLN
60.0000 mg | Freq: Two times a day (BID) | INTRAMUSCULAR | Status: DC
  Administered 2017-09-02: 60 mg via INTRAVENOUS
  Filled 2017-09-01: qty 6

## 2017-09-01 MED ORDER — HYDROCORTISONE ACE-PRAMOXINE 2.5-1 % RE CREA
TOPICAL_CREAM | Freq: Four times a day (QID) | RECTAL | Status: DC | PRN
  Filled 2017-09-01: qty 30

## 2017-09-01 MED ORDER — NYSTATIN 100000 UNIT/GM EX CREA
TOPICAL_CREAM | Freq: Two times a day (BID) | CUTANEOUS | Status: DC
  Administered 2017-09-01: 1 via TOPICAL
  Administered 2017-09-02 – 2017-09-03 (×3): via TOPICAL
  Administered 2017-09-04: 1 via TOPICAL
  Administered 2017-09-05 – 2017-09-06 (×3): via TOPICAL
  Administered 2017-09-06: 1 via TOPICAL
  Administered 2017-09-07 – 2017-09-08 (×3): via TOPICAL
  Administered 2017-09-08: 1 via TOPICAL
  Administered 2017-09-09 (×2): via TOPICAL
  Administered 2017-09-10: 1 via TOPICAL
  Filled 2017-09-01 (×3): qty 15

## 2017-09-01 MED ORDER — NYSTATIN 100000 UNIT/GM EX CREA
TOPICAL_CREAM | CUTANEOUS | Status: AC
Start: 2017-09-01 — End: ?
  Filled 2017-09-01: qty 15

## 2017-09-01 MED ORDER — BOOST / RESOURCE BREEZE PO LIQD CUSTOM
1.0000 | Freq: Four times a day (QID) | ORAL | Status: DC
  Administered 2017-09-01 – 2017-09-02 (×3): 1 via ORAL

## 2017-09-01 MED ORDER — ONDANSETRON HCL 4 MG/2ML IJ SOLN
4.0000 mg | Freq: Four times a day (QID) | INTRAMUSCULAR | Status: DC | PRN
  Administered 2017-09-01 – 2017-09-02 (×2): 4 mg via INTRAVENOUS
  Filled 2017-09-01 (×3): qty 2

## 2017-09-01 MED ORDER — VITAMIN K1 10 MG/ML IJ SOLN
INTRAMUSCULAR | Status: AC
  Filled 2017-09-01: qty 1

## 2017-09-01 MED ORDER — FUROSEMIDE 10 MG/ML IJ SOLN
60.0000 mg | Freq: Two times a day (BID) | INTRAMUSCULAR | Status: DC
  Filled 2017-09-01: qty 6

## 2017-09-01 MED ORDER — POLYVINYL ALCOHOL 1.4 % OP SOLN
1.0000 [drp] | Freq: Every day | OPHTHALMIC | Status: DC | PRN
  Filled 2017-09-01: qty 15

## 2017-09-01 MED ORDER — SODIUM CHLORIDE 0.9% FLUSH
3.0000 mL | Freq: Two times a day (BID) | INTRAVENOUS | Status: DC
  Administered 2017-09-01 – 2017-09-05 (×8): 3 mL via INTRAVENOUS
  Administered 2017-09-06: 10 mL via INTRAVENOUS
  Administered 2017-09-06 – 2017-09-09 (×5): 3 mL via INTRAVENOUS

## 2017-09-01 MED ORDER — ALBUMIN HUMAN 25 % IV SOLN
INTRAVENOUS | Status: AC
  Administered 2017-09-01: 50 g via INTRAVENOUS
  Filled 2017-09-01: qty 200

## 2017-09-01 NOTE — H&P (Signed)
History and Physical    [name] LGX:[id number] DOB: February 15, 1941 DOA: 09/01/2017  Referring MD/NP/PA: Dr. Roderic Palau PCP: Sharilyn Sites, MD  Patient coming from: Home  Chief Complaint:  generalized weakness, worsening SOB and anasarca.   HPI: [name] is a 77 y.o. female with past medical history significant for [name] cirrhosis, history of depression, gastroesophageal reflux disease/peptic ulcer disease, hypercholesterolemia and hypothyroidism; who presented to the emergency department secondary to increased fatigue, shortness of breath and anasarca.  Looking into her records patient was admitted at the end of April secondary to anasarca at that time she was discharged with a weight of 210 and adjusted dosages of diuretics.  She has on April 1 2.8 L removed with paracentesis and then in April 25 she has 2.5 L removed with paracentesis as well.  Patient symptoms have been present for the last 2 weeks and is steadily worsening to the point that she was referred by her primary care doctor to the emergency department for further evaluation and treatment. Patient expressed having some nausea recently and that she is eating, reports decreased intake also intermittent episode of hematochezia. She denies any abdominal pain, chest pain, shortness of breath, hematuria, dysuria, headaches and focal neurologic/motor deficit.  Past Medical/Surgical History: Past Medical History:  Diagnosis Date  . Ankle fracture, right 05/21/10  . Anxiety   . Arthritis   . Back fracture 05/21/10  . Depression   . Fibromyalgia   . GERD (gastroesophageal reflux disease)   . Hypercholesteremia   . 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 Knee fracture, right 05/21/10  . Nonalcoholic steatohepatitis (NASH) BMI > 40   NOV 2013 CHILD PUGH A, MELD SCORE 7. NOV 2017 CHILD PUGH B, MELD 9  . Obesity   . Splenomegaly 09/23/2012  . Thrombocytopenia ([place]) 02/19/2012    Past Surgical History:    Procedure Laterality Date  . ABDOMINAL HYSTERECTOMY    . BREAST BIOPSY  1982   left benign  . CATARACT EXTRACTION W/PHACO Right 07/11/2015   Procedure: CATARACT EXTRACTION PHACO AND INTRAOCULAR LENS PLACEMENT (IOC);  Surgeon: Rutherford Guys, MD;  Location: [facility];  Service: Ophthalmology;  Laterality: Right;  CDE: 6.00  . CATARACT EXTRACTION W/PHACO Left 07/25/2015   Procedure: CATARACT EXTRACTION PHACO AND INTRAOCULAR LENS PLACEMENT (IOC);  Surgeon: Rutherford Guys, MD;  Location: [facility];  Service: Ophthalmology;  Laterality: Left;  CDE:8.58  . COLONOSCOPY  June 2010   Dr. Collene Mares: few scattered sigmoid diverticula, otherwise normal. Screening in 2020   . ESOPHAGOGASTRODUODENOSCOPY  03/02/2012   Dr. Oneida Alar:The mucosa of the esophagus appeared normal Multiple non-bleeding ulcers, ranging between 3-13mm in size were found in the gastric antrum  The duodenal mucosa showed no abnormalities in the bulb and second portion of the duodenum. path negative for H.pylori  . ESOPHAGOGASTRODUODENOSCOPY N/A 07/15/2014   SLF: 1. No esophageal, gastric, or duodenal varices 2. small 1-2 mm gastric ulcer. Negative H.pylori   . ESOPHAGOGASTRODUODENOSCOPY N/A 06/05/2015   SLF: 1. Mild gastritis on ASA and PPI  . PARTIAL HYSTERECTOMY      Social History:  reports that she has never smoked. She has never used smokeless tobacco. She reports that she does not drink alcohol or use drugs.  Allergies: No Known Allergies  Family History:  Family History  Problem Relation Age of Onset  . Heart disease Father   . Heart attack Father   . Aneurysm Mother   . Diabetes Sister   . Hypertension Sister   .  Heart disease Sister   . Hypertension Brother   . Heart disease Brother   . Colon cancer Neg Hx     Prior to Admission medications   Medication Sig Start Date End Date Taking? Authorizing Provider  aspirin EC 81 MG tablet Take 81 mg by mouth at bedtime.    [provider]  Cholecalciferol (VITAMIN D3) 5000 units  CAPS Take 1 capsule daily by mouth.    [provider]  FLUoxetine (PROZAC) 20 MG capsule Take 20 mg by mouth daily. 12/25/15   [provider]  furosemide (LASIX) 80 MG tablet Take 1 tablet (80 mg total) by mouth 2 (two) times daily. 07/29/17   Orson Eva, MD  KLOR-CON M20 20 MEQ tablet Take 20 mEq by mouth daily. 07/06/17   [provider]  levothyroxine (SYNTHROID, LEVOTHROID) 100 MCG tablet Take 100 mcg by mouth daily. 08/06/17   [provider]  levothyroxine (SYNTHROID, LEVOTHROID) 75 MCG tablet Take 75 mcg by mouth daily before breakfast.    [provider]  lidocaine-hydrocortisone (ANAMANTEL HC) 3-0.5 % CREA Apply to the external hemorrhoid area 4 times daily as needed 06/20/17   Fontaine, Belinda Block, MD  loratadine-pseudoephedrine (CLARITIN-D 24-HOUR) 10-240 MG 24 hr tablet Take 1 tablet by mouth daily as needed for allergies.    [provider]  magnesium oxide (MAG-OX) 400 (241.3 Mg) MG tablet Take 1 tablet (400 mg total) by mouth daily. 07/29/17   Orson Eva, MD  pantoprazole (PROTONIX) 40 MG tablet Take 1 tablet (40 mg total) by mouth daily. 03/23/15   Annitta Needs, NP  Propylene Glycol (SYSTANE BALANCE) 0.6 % SOLN Apply 1 drop to eye daily as needed. Dry Eyes    [provider]  simvastatin (ZOCOR) 40 MG tablet Take 40 mg by mouth daily.     [provider]  spironolactone (ALDACTONE) 50 MG tablet Take 1 tablet (50 mg total) by mouth 2 (two) times daily. 07/29/17   Orson Eva, MD    Review of Systems:  All 10 systems has been reviewed and otherwise negative except as mentioned in HPI.  Physical Exam: Vitals:   09/01/17 1116 09/01/17 1117 09/01/17 1300 09/01/17 1402  BP: 97/61  (!) 100/51   Pulse: 88  76   Resp: 14  (!) 23   Temp: 98.4 F (36.9 C)     TempSrc: Tympanic     SpO2: 96%  96%   Weight:  93 kg (205 lb)  93.1 kg (205 lb 4 oz)  Height:  5\' 2"  (1.575 m)      Constitutional: Chronically ill in  appearance, afebrile, denying chest pain, and just complaining of mild discomfort in her abdomen at this time.  Patient expressed feeling nauseated. Eyes: PERRL, lids and conjunctivae normal, no icterus, no nystagmus. ENMT: Mucous membranes are moist. Posterior pharynx clear of any exudate or lesions. Normal dentition.  Neck: normal, supple, no masses, no thyromegaly Respiratory: Decreased breath sounds at the bases, no wheezing, no frank crackles, mild tachypnea especially with exertion, no using accessory muscles.   Cardiovascular: Regular rate and rhythm, positive systolic ejection murmur, no rubs, no gallops, no JVD appreciated on exam. Abdomen: no tenderness on palpation, no masses palpated.  Ulcerative bowel sounds, positive fluid wave and ascites. Musculoskeletal: No joint swelling, 2-3+ edema bilaterally; no clubbing / cyanosis.  Skin: no rashes, lesions, ulcers. No induration Neurologic: CN 2-12 grossly intact. Sensation intact, DTR normal. Strength 3/5 in all 4 due to poor effort and  deconditioning.Marland Kitchen  Psychiatric: Normal judgment and insight. Alert and oriented x 3. Normal mood.    Labs on Admission: I have personally reviewed the following labs and imaging studies  CBC: Recent Labs  Lab 09/01/17 1141  WBC 8.5  NEUTROABS 6.6  HGB 11.9*  HCT 35.4*  MCV 103.2*  PLT 70*   Basic Metabolic Panel: Recent Labs  Lab 09/01/17 1141  NA 133*  K 3.2*  CL 99*  CO2 27  GLUCOSE 93  BUN 21*  CREATININE 1.11*  CALCIUM 8.8*   GFR: Estimated Creatinine Clearance: 45.8 mL/min (A) (by C-G formula based on SCr of 1.11 mg/dL (H)).   Liver Function Tests: Recent Labs  Lab 09/01/17 1141  AST 52*  ALT 29  ALKPHOS 141*  BILITOT 2.3*  PROT 6.0*  ALBUMIN 2.4*   Coagulation Profile: Recent Labs  Lab 09/01/17 1141  INR 1.70   Urine analysis:    Component Value Date/Time   COLORURINE YELLOW 07/23/2017 Story 07/23/2017 1509   LABSPEC 1.016 07/23/2017 1509     PHURINE 6.0 07/23/2017 1509   GLUCOSEU NEGATIVE 07/23/2017 1509   HGBUR NEGATIVE 07/23/2017 1509   BILIRUBINUR NEGATIVE 07/23/2017 1509   KETONESUR NEGATIVE 07/23/2017 1509   PROTEINUR NEGATIVE 07/23/2017 1509   UROBILINOGEN 0.2 07/27/2014 1220   NITRITE NEGATIVE 07/23/2017 1509   LEUKOCYTESUR NEGATIVE 07/23/2017 1509    Radiological Exams on Admission: Dg Chest 2 View  Result Date: 09/01/2017 CLINICAL DATA:  Coughing and shortness of breath over the last 2 weeks. EXAM: CHEST - 2 VIEW COMPARISON:  07/23/2017.  06/29/2017. FINDINGS: Poor inspiration or low lung volumes. Heart size is normal. Chronic aortic atherosclerosis. Bronchial thickening pattern consistent with bronchitis, but no consolidation or lobar collapse. No effusions. Chronic degenerative changes affect the spine. Old thoracolumbar compression fractures. IMPRESSION: Possible bronchitis. No consolidation or collapse. Poor inspiration versus low lung volumes. Electronically Signed   By: Nelson Chimes M.D.   On: 09/01/2017 12:27    EKG: Independently reviewed.  No ischemic changes appreciated.  Positive LVH, normal axis.   Assessment/Plan 1-anasarca and ascites -Paracentesis therapeutic to be done under ultrasound. -Start IV Lasix and continue Aldactone -will give 3 doses of albumin  -follow electrolytes and renal function  -low sodium diet ordered  -will follow GI recommendations.   2-hematochezia  -per GI notes, bowel prep with anticipation for colonoscopy -Hgb stable -prior hx of hemorrhoids -holding ASA  3-Hyperlipidemia -continue statins   4-Thrombocytopenia (H[facility]) -avoid heparin products -SCD's for DVT prophylaxis -follow Platelets trend   5-PUD (peptic ulcer disease) -continue PPI  6-Liver cirrhosis secondary to NASH ([place]) -per GI rec's -no altered mental status  7-Hypothyroidism  -check TSH -continue synthroid   8-depression  -continue prozac  -no SI and no hallucinations    9-deconditioning -will ask PT to see her   DVT prophylaxis: SCD's  Code Status: Full Family Communication: no family at bedside   Disposition Plan: To be determined. Hoping to stabilized to go home with [facility] services at discharge.  Consults called: Gastroenterology service Admission status: Inpatient, length of stay more than 2 midnights; telemetry bed.   Time Spent: 70 minutes  Barton Dubois MD [facility] Pager [phone]  If 7PM-7AM, please contact night-coverage [email or web address] Password [facility]  09/01/2017, 2:31 PM

## 2017-09-01 NOTE — Consult Note (Signed)
Referring Provider: Dr. Roderic Palau  Primary Care Physician:  Sharilyn Sites, MD Primary Gastroenterologist:  Dr. Oneida Alar   Date of Admission: 09/01/17 Date of Consultation: 09/01/17  Reason for Consultation: Cirrhosis   HPI:  [name] is a pleasant 77 y.o. year old female with NASH cirrhosis, well known to [place]. She has required US paracentesis in June 30, 2017 (2.8 liters), July 24, 2017 (2.5 liters), and today 5 liters. She was recently inpatient end of April 2019 with decompensated cirrhosis with anasarca. Discharge weight at that time was 210. Today 205. Lasix was increased to 80 mg BID, aldactone 50 mg BID. Hgb ranging from 10-11. Last EGD in March 2017 without varices. Last colonoscopy in 2010 with scattered diverticula. Due for hepatoma screening. Last CT Nov 2018.   Presented with lower extremity edema, abdominal distension. Denies abdominal pain. Notes nausea for 2 weeks, vomiting with any solid foods. DENIES dysphagia. Postprandial loose stool noted. She states if she doesn't eat, she won't have a BM. Intermittent rectal bleeding has been ongoing but Hgb remaining stable. Decreased appetite. Feels like she has been declining since Feb 2019. Feels fatigued. Denies recent antibiotic use, no sick contacts. Feels cold all the time. No fever. Lips stay chapped. Lives alone, but her daughter lives behind her and son is close by. No mental status changes or confusion. Notes a rash under her breasts and between legs.   Past Medical History:  Diagnosis Date  . Ankle fracture, right 05/21/10  . Anxiety   . Arthritis   . Back fracture 05/21/10  . Depression   . Fibromyalgia   . GERD (gastroesophageal reflux disease)   . Hypercholesteremia   . 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 Knee fracture, right 05/21/10  . Nonalcoholic steatohepatitis (NASH) BMI > 40   NOV 2013 CHILD PUGH A, MELD SCORE 7. NOV 2017 CHILD PUGH B, MELD 9  . Obesity   . Splenomegaly 09/23/2012  .  Thrombocytopenia ([place]) 02/19/2012    Past Surgical History:  Procedure Laterality Date  . ABDOMINAL HYSTERECTOMY    . BREAST BIOPSY  1982   left benign  . CATARACT EXTRACTION W/PHACO Right 07/11/2015   Procedure: CATARACT EXTRACTION PHACO AND INTRAOCULAR LENS PLACEMENT (IOC);  Surgeon: Rutherford Guys, MD;  Location: [facility];  Service: Ophthalmology;  Laterality: Right;  CDE: 6.00  . CATARACT EXTRACTION W/PHACO Left 07/25/2015   Procedure: CATARACT EXTRACTION PHACO AND INTRAOCULAR LENS PLACEMENT (IOC);  Surgeon: Rutherford Guys, MD;  Location: [facility];  Service: Ophthalmology;  Laterality: Left;  CDE:8.58  . COLONOSCOPY  June 2010   Dr. Collene Mares: few scattered sigmoid diverticula, otherwise normal. Screening in 2020   . ESOPHAGOGASTRODUODENOSCOPY  03/02/2012   Dr. Oneida Alar:The mucosa of the esophagus appeared normal Multiple non-bleeding ulcers, ranging between 3-12mm in size were found in the gastric antrum  The duodenal mucosa showed no abnormalities in the bulb and second portion of the duodenum. path negative for H.pylori  . ESOPHAGOGASTRODUODENOSCOPY N/A 07/15/2014   SLF: 1. No esophageal, gastric, or duodenal varices 2. small 1-2 mm gastric ulcer. Negative H.pylori   . ESOPHAGOGASTRODUODENOSCOPY N/A 06/05/2015   SLF: 1. Mild gastritis on ASA and PPI  . PARTIAL HYSTERECTOMY      Prior to Admission medications   Medication Sig Start Date End Date Taking? Authorizing Provider  aspirin EC 81 MG tablet Take 81 mg by mouth at bedtime.   Yes [provider]  Cholecalciferol (VITAMIN D3) 5000 units CAPS Take 1 capsule daily by  mouth.   Yes [provider]  FLUoxetine (PROZAC) 20 MG capsule Take 20 mg by mouth daily. 12/25/15  Yes [provider]  furosemide (LASIX) 80 MG tablet Take 1 tablet (80 mg total) by mouth 2 (two) times daily. 07/29/17  Yes Tat, Shanon Brow, MD  KLOR-CON M20 20 MEQ tablet Take 20 mEq by mouth daily. 07/06/17  Yes [provider]  levothyroxine (SYNTHROID,  LEVOTHROID) 100 MCG tablet Take 100 mcg by mouth daily. 08/06/17  Yes [provider]  lidocaine-hydrocortisone (ANAMANTEL HC) 3-0.5 % CREA Apply to the external hemorrhoid area 4 times daily as needed 06/20/17  Yes Fontaine, Belinda Block, MD  loratadine-pseudoephedrine (CLARITIN-D 24-HOUR) 10-240 MG 24 hr tablet Take 1 tablet by mouth daily as needed for allergies.   Yes [provider]  magnesium oxide (MAG-OX) 400 (241.3 Mg) MG tablet Take 1 tablet (400 mg total) by mouth daily. 07/29/17  Yes Tat, Shanon Brow, MD  pantoprazole (PROTONIX) 40 MG tablet Take 1 tablet (40 mg total) by mouth daily. 03/23/15  Yes Annitta Needs, NP  Propylene Glycol (SYSTANE BALANCE) 0.6 % SOLN Apply 1 drop to eye daily as needed. Dry Eyes   Yes [provider]  simvastatin (ZOCOR) 40 MG tablet Take 40 mg by mouth daily.    Yes [provider]  spironolactone (ALDACTONE) 50 MG tablet Take 1 tablet (50 mg total) by mouth 2 (two) times daily. 07/29/17  Yes TatShanon Brow, MD    Current Facility-Administered Medications  Medication Dose Route Frequency Provider Last Rate Last Dose  . albumin human 25 % solution 50 g  50 g Intravenous Q6H Barton Dubois, MD   50 g at 09/01/17 1500   Current Outpatient Medications  Medication Sig Dispense Refill  . aspirin EC 81 MG tablet Take 81 mg by mouth at bedtime.    . Cholecalciferol (VITAMIN D3) 5000 units CAPS Take 1 capsule daily by mouth.    Marland Kitchen FLUoxetine (PROZAC) 20 MG capsule Take 20 mg by mouth daily.  0  . furosemide (LASIX) 80 MG tablet Take 1 tablet (80 mg total) by mouth 2 (two) times daily. 60 tablet 1  . KLOR-CON M20 20 MEQ tablet Take 20 mEq by mouth daily.  3  . levothyroxine (SYNTHROID, LEVOTHROID) 100 MCG tablet Take 100 mcg by mouth daily.  1  . lidocaine-hydrocortisone (ANAMANTEL HC) 3-0.5 % CREA Apply to the external hemorrhoid area 4 times daily as needed 1 Tube 0  . loratadine-pseudoephedrine (CLARITIN-D 24-HOUR) 10-240 MG 24 hr tablet Take  1 tablet by mouth daily as needed for allergies.    . magnesium oxide (MAG-OX) 400 (241.3 Mg) MG tablet Take 1 tablet (400 mg total) by mouth daily. 30 tablet 1  . pantoprazole (PROTONIX) 40 MG tablet Take 1 tablet (40 mg total) by mouth daily. 90 tablet 3  . Propylene Glycol (SYSTANE BALANCE) 0.6 % SOLN Apply 1 drop to eye daily as needed. Dry Eyes    . simvastatin (ZOCOR) 40 MG tablet Take 40 mg by mouth daily.     Marland Kitchen spironolactone (ALDACTONE) 50 MG tablet Take 1 tablet (50 mg total) by mouth 2 (two) times daily. 60 tablet 1    Allergies as of 09/01/2017  . (No Known Allergies)    Family History  Problem Relation Age of Onset  . Heart disease Father   . Heart attack Father   . Aneurysm Mother   . Diabetes Sister   . Hypertension Sister   . Heart disease Sister   .  Hypertension Brother   . Heart disease Brother   . Colon cancer Neg Hx     Social History   Socioeconomic History  . Marital status: Widowed    Spouse name: Not on file  . Number of children: Not on file  . Years of education: Not on file  . Highest education level: Not on file  Occupational History  . 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 Financial resource strain: Not on file  . Food insecurity:    Worry: Not on file    Inability: Not on file  . Transportation needs:    Medical: Not on file    Non-medical: Not on file  Tobacco Use  . Smoking status: Never Smoker  . Smokeless tobacco: Never Used  . Tobacco comment: Never smoked  Substance and Sexual Activity  . Alcohol use: No    Alcohol/week: 0.0 oz  . Drug use: No  . Sexual activity: Never  Lifestyle  . Physical activity:    Days per week: Not on file    Minutes per session: Not on file  . Stress: Not on file  Relationships  . Social connections:    Talks on phone: Not on file    Gets together: Not on file    Attends religious service: Not on file    Active member of club or organization:  Not on file    Attends meetings of clubs or organizations: Not on file    Relationship status: Not on file  . Intimate partner violence:    Fear of current or ex partner: Not on file    Emotionally abused: Not on file    Physically abused: Not on file    Forced sexual activity: Not on file  Other Topics Concern  . Not on file  Social History Narrative   HAS A HUSBAND WHO MAY HAVE DEMENTIA AND IS CHALLENGING TO CARE FOR.    Review of Systems: As mentioned in HPI   Physical Exam: Vital signs in last 24 hours: Temp:  [97.4 F (36.3 C)-98.4 F (36.9 C)] 97.4 F (36.3 C) (06/03 1543) Pulse Rate:  [73-88] 73 (06/03 1543) Resp:  [14-23] 14 (06/03 1543) BP: (97-100)/(50-87) 100/50 (06/03 1543) SpO2:  [96 %-99 %] 99 % (06/03 1543) Weight:  [205 lb (93 kg)-205 lb 4 oz (93.1 kg)] 205 lb 4 oz (93.1 kg) (06/03 1402)   General:   Alert, face thin, appears fatigued Head:  Normocephalic and atraumatic. Eyes:  Sclera clear, no icterus.    Ears:  Normal auditory acuity. Nose:  No deformity, discharge,  or lesions. Mouth:  No deformity or lesions, dentition normal. Lungs:  Clear throughout to auscultation.  Heart:  S1 S2 present with systolic murmur  Abdomen:  Soft, obese, nontender and nondistended. Normal bowel sounds. Purpura-like rash beneath breasts  Rectal:  Deferred  Msk:  Symmetrical without gross deformities. Normal posture. Extremities:  With 2-3+ pitting edema lower extremities, 1+ pitting edema thigh  Neurologic:  Alert and  oriented x4;  grossly normal neurologically. Psych:  Alert and cooperative. Normal mood and affect.  Intake/Output from previous day: No intake/output data recorded. Intake/Output this shift: No intake/output data recorded.  Lab Results: Recent Labs    09/01/17 1141  WBC 8.5  HGB 11.9*  HCT 35.4*  PLT 70*   BMET Recent Labs    09/01/17 1141  NA 133*  K 3.2*  CL 99*  CO2  27  GLUCOSE 93  BUN 21*  CREATININE 1.11*  CALCIUM 8.8*    LFT Recent Labs    09/01/17 1141  PROT 6.0*  ALBUMIN 2.4*  AST 52*  ALT 29  ALKPHOS 141*  BILITOT 2.3*   PT/INR Recent Labs    09/01/17 1141  LABPROT 19.8*  INR 1.70    Studies/Results: Dg Chest 2 View  Result Date: 09/01/2017 CLINICAL DATA:  Coughing and shortness of breath over the last 2 weeks. EXAM: CHEST - 2 VIEW COMPARISON:  07/23/2017.  06/29/2017. FINDINGS: Poor inspiration or low lung volumes. Heart size is normal. Chronic aortic atherosclerosis. Bronchial thickening pattern consistent with bronchitis, but no consolidation or lobar collapse. No effusions. Chronic degenerative changes affect the spine. Old thoracolumbar compression fractures. IMPRESSION: Possible bronchitis. No consolidation or collapse. Poor inspiration versus low lung volumes. Electronically Signed   By: Nelson Chimes M.D.   On: 09/01/2017 12:27   US Paracentesis  Result Date: 09/01/2017 INDICATION: Recurrent ascites, cirrhosis due to NASH EXAM: ULTRASOUND GUIDED THERAPEUTIC PARACENTESIS MEDICATIONS: None. COMPLICATIONS: None immediate. PROCEDURE: Procedure, benefits, and risks of procedure were discussed with patient. Written informed consent for procedure was obtained. Time out protocol followed. Adequate collection of ascites localized by ultrasound in RIGHT lower quadrant. Skin prepped and draped in usual sterile fashion. Skin and soft tissues anesthetized with 10 mL of 1% lidocaine. 5 [place] Yueh catheter placed into peritoneal cavity. 5 L of clear yellow ascitic fluid aspirated by vacuum bottle suction. Procedure tolerated well by patient without immediate complication. FINDINGS: As above IMPRESSION: Successful ultrasound-guided paracentesis yielding 5 liters of peritoneal fluid. Electronically Signed   By: Lavonia Dana M.D.   On: 09/01/2017 15:58    Impression: 77 year old pleasant female presenting with decompensated NASH cirrhosis, MELD Na 21, Child Pugh C. Low-volume rectal bleeding for several weeks  but hemoglobin remaining stable. Paracentesis X 2 in April, and paracentesis today with 5 liters removed. Low concern for SBP. Anasarca noted in setting of hypoalbuminemia. She has had nausea, vomiting, loose stool for several weeks and unable to advance diet to solid foods yet denies dysphagia. She has had a decline over past several months and despite anasarca and ascites, her weight is actually down from last admission.   Will stop aspirin. Plan on prep with Miralax starting today and tomorrow. Colonoscopy tentatively Wednesday. Will need EGD if she is unable to tolerate oral intake. Will also give Vit K IV daily X 3 doses. Clear liquids ordered but may have Boost QID today and tomorrow.   Plan: Clear liquids but may have Boost QID  Miralax for 3 doses today, Miralax 6 doses tomorrow Tentatively planning for colonoscopy September 03, 2017, will need INR 1.8 or less  Vit K IV daily X 3 doses, follow INR Hold diuretic therapy this evening due to paracentesis today Discontinue aspirin Will continue to follow with you  Annitta Needs, PhD, ANP-BC [facility]       LOS: 0 days    09/01/2017, 4:10 PM

## 2017-09-01 NOTE — ED Triage Notes (Signed)
Pt reports having cirrhosis with paracentesis done in April.  Has had progressive swelling with returning ascites since then.  Went to pcp this morning and was sent here for eval.  Pt reports no energy.

## 2017-09-01 NOTE — Telephone Encounter (Signed)
EXPLAINED TO SON PT HAS CIRRHOSIS. LIKELY CAUSE FOR NAUSEA AND VOMITING IS CALCIUM. PT SHOULD STOP ASA. PLAN FOR TCS ON WED JUN 5. PREP JUN 3 AND JUN 4. NO INDICATION FOR EGD UNLESS PT CANNOT TOLERATE POs. DEFINITE COMPONENT OF FAILURE TO THRIVE IS AGE, CIRRHOSIS, AND DEPRESSION.  WE WILL TRY TO MANAGE FLUID RETENTION BUT IT WILL NOT GET BETTER UNLESS PT CAN EAT. ADD PROTEIN SHAKES TID AT HOME AND QID WHILE HOSPITALIZED.   STOP ASPIRIN. CONSIDER STOPPING PROZAC AS WELS SINCE PT APPEARS TO BE HAVING A MEDICATION REACTION. CONSIDER ALTERNATIVE TO SSRIs.  PT HAD 5lS FLUID REMOVED. HOLD LASIX AND ALDACTONE TONIGHT. RE-START JUN 4.

## 2017-09-01 NOTE — ED Notes (Signed)
Patient weighed 205.4 lbs.

## 2017-09-01 NOTE — ED Triage Notes (Signed)
Pt reports having bleeding from mouth and rectum as well x 2 weeks.

## 2017-09-01 NOTE — ED Provider Notes (Addendum)
[facility]GENC[facility] DEPARTMENT Provider Note   CSN: [id number] Arrival date & time: 09/01/17  1111     History   Chief Complaint Chief Complaint  Patient presents with  . Edema    HPI Melanie Porter is a 77 y.o. female.  Patient states that her swelling in her legs have been getting much worse.  She has become very weak over the last 3 days he can barely get out of a chair  The history is provided by the patient. No language interpreter was used.  Weakness  Primary symptoms include no focal weakness. This is a recurrent problem. The current episode started more than 2 days ago. The problem has not changed since onset.There was no focality noted. There has been no fever. Pertinent negatives include no shortness of breath, no chest pain and no headaches.    Past Medical History:  Diagnosis Date  . Ankle fracture, right 05/21/10  . Anxiety   . Arthritis   . Back fracture 05/21/10  . Depression   . Fibromyalgia   . GERD (gastroesophageal reflux disease)   . Hypercholesteremia   . 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 Knee fracture, right 05/21/10  . Nonalcoholic steatohepatitis (NASH) BMI > 40   NOV 2013 CHILD PUGH A, MELD SCORE 7. NOV 2017 CHILD PUGH B, MELD 9  . Obesity   . Splenomegaly 09/23/2012  . Thrombocytopenia ([place]) 02/19/2012    Patient Active Problem List   Diagnosis Date Noted  . Abnormal CT of the abdomen 07/23/2017  . Decreased appetite 07/23/2017  . Nausea without vomiting 07/23/2017  . Dark stools 07/23/2017  . Hypothyroidism 06/30/2017  . Diarrhea 06/30/2017  . Volume overload 06/30/2017  . Cirrhosis of liver with ascites ([place]) 06/30/2017  . Anasarca   . Liver cirrhosis secondary to NASH ([place]) 06/23/2015  . Spinal stenosis of lumbar region 01/25/2014  . PUD (peptic ulcer disease) 11/18/2013  . Undiagnosed cardiac murmurs 11/18/2013  . Osteopenia 07/20/2013  . Vaginal atrophy 07/20/2013  . Platelet disorder(CLUMPING)  12/28/2012  . Dermatitis perineal 12/28/2012  . Splenomegaly 09/23/2012  . 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 Thrombocytopenia ([place]) 02/19/2012  . Fibromyalgia 02/04/2012  . Hyperlipidemia 02/04/2012  . Arthritis 02/04/2012  . GERD (gastroesophageal reflux disease) 02/04/2012    Past Surgical History:  Procedure Laterality Date  . ABDOMINAL HYSTERECTOMY    . BREAST BIOPSY  1982   left benign  . CATARACT EXTRACTION W/PHACO Right 07/11/2015   Procedure: CATARACT EXTRACTION PHACO AND INTRAOCULAR LENS PLACEMENT (IOC);  Surgeon: Rutherford Guys, MD;  Location: [facility];  Service: Ophthalmology;  Laterality: Right;  CDE: 6.00  . CATARACT EXTRACTION W/PHACO Left 07/25/2015   Procedure: CATARACT EXTRACTION PHACO AND INTRAOCULAR LENS PLACEMENT (IOC);  Surgeon: Rutherford Guys, MD;  Location: [facility];  Service: Ophthalmology;  Laterality: Left;  CDE:8.58  . COLONOSCOPY  June 2010   Dr. Collene Mares: few scattered sigmoid diverticula, otherwise normal. Screening in 2020   . ESOPHAGOGASTRODUODENOSCOPY  03/02/2012   Dr. Oneida Alar:The mucosa of the esophagus appeared normal Multiple non-bleeding ulcers, ranging between 3-22mm in size were found in the gastric antrum  The duodenal mucosa showed no abnormalities in the bulb and second portion of the duodenum. path negative for H.pylori  . ESOPHAGOGASTRODUODENOSCOPY N/A 07/15/2014   SLF: 1. No esophageal, gastric, or duodenal varices 2. small 1-2 mm gastric ulcer. Negative H.pylori   . ESOPHAGOGASTRODUODENOSCOPY N/A 06/05/2015  SLF: 1. Mild gastritis on ASA and PPI  . PARTIAL HYSTERECTOMY       OB History    Gravida  2   Para  2   Term  2   Preterm      AB      Living  2     SAB      TAB      Ectopic      Multiple      Live Births  2            Home Medications    Prior to Admission medications   Medication Sig Start Date End Date Taking?  Authorizing Provider  aspirin EC 81 MG tablet Take 81 mg by mouth at bedtime.    [provider]  Cholecalciferol (VITAMIN D3) 5000 units CAPS Take 1 capsule daily by mouth.    [provider]  FLUoxetine (PROZAC) 20 MG capsule Take 20 mg by mouth daily. 12/25/15   [provider]  furosemide (LASIX) 80 MG tablet Take 1 tablet (80 mg total) by mouth 2 (two) times daily. 07/29/17   Orson Eva, MD  KLOR-CON M20 20 MEQ tablet Take 20 mEq by mouth daily. 07/06/17   [provider]  levothyroxine (SYNTHROID, LEVOTHROID) 100 MCG tablet Take 100 mcg by mouth daily. 08/06/17   [provider]  levothyroxine (SYNTHROID, LEVOTHROID) 75 MCG tablet Take 75 mcg by mouth daily before breakfast.    [provider]  lidocaine-hydrocortisone (ANAMANTEL HC) 3-0.5 % CREA Apply to the external hemorrhoid area 4 times daily as needed 06/20/17   Fontaine, Belinda Block, MD  loratadine-pseudoephedrine (CLARITIN-D 24-HOUR) 10-240 MG 24 hr tablet Take 1 tablet by mouth daily as needed for allergies.    [provider]  magnesium oxide (MAG-OX) 400 (241.3 Mg) MG tablet Take 1 tablet (400 mg total) by mouth daily. 07/29/17   Orson Eva, MD  pantoprazole (PROTONIX) 40 MG tablet Take 1 tablet (40 mg total) by mouth daily. 03/23/15   Annitta Needs, NP  Propylene Glycol (SYSTANE BALANCE) 0.6 % SOLN Apply 1 drop to eye daily as needed. Dry Eyes    [provider]  simvastatin (ZOCOR) 40 MG tablet Take 40 mg by mouth daily.     [provider]  spironolactone (ALDACTONE) 50 MG tablet Take 1 tablet (50 mg total) by mouth 2 (two) times daily. 07/29/17   Orson Eva, MD    Family History Family History  Problem Relation Age of Onset  . Heart disease Father   . Heart attack Father   . Aneurysm Mother   . Diabetes Sister   . Hypertension Sister   . Heart disease Sister   . Hypertension Brother   . Heart disease Brother   . Colon cancer Neg Hx     Social  History Social History   Tobacco Use  . Smoking status: Never Smoker  . Smokeless tobacco: Never Used  . Tobacco comment: Never smoked  Substance Use Topics  . Alcohol use: No    Alcohol/week: 0.0 oz  . Drug use: No     Allergies   Patient has no known allergies.   Review of Systems Review of Systems  Constitutional: Negative for appetite change and fatigue.  HENT: Negative for congestion, ear discharge and sinus pressure.   Eyes: Negative for discharge.  Respiratory: Negative for cough and shortness of breath.   Cardiovascular: Negative for chest pain.  Gastrointestinal: Negative for abdominal pain and diarrhea.  Genitourinary: Negative for frequency and hematuria.  Musculoskeletal: Negative for back pain.       Swelling in legs  Skin: Negative for rash.  Neurological: Positive for weakness. Negative for focal weakness, seizures and headaches.  Psychiatric/Behavioral: Negative for hallucinations.     Physical Exam Updated Vital Signs BP (!) 100/51   Pulse 76   Temp 98.4 F (36.9 C) (Tympanic)   Resp (!) 23   Ht 5\' 2"  (1.575 m)   Wt 93.1 kg (205 lb 4 oz)   SpO2 96%   BMI 37.54 kg/m   Physical Exam  Constitutional: She is oriented to person, place, and time. She appears well-developed.  HENT:  Head: Normocephalic.  Eyes: Conjunctivae and EOM are normal. No scleral icterus.  Neck: Neck supple. No thyromegaly present.  Cardiovascular: Normal rate and regular rhythm. Exam reveals no gallop and no friction rub.  No murmur heard. Pulmonary/Chest: No stridor. She has no wheezes. She has no rales. She exhibits no tenderness.  Abdominal: She exhibits distension. There is no tenderness. There is no rebound.  Minimal tenderness  Musculoskeletal: Normal range of motion. She exhibits edema.  Lymphadenopathy:    She has no cervical adenopathy.  Neurological: She is oriented to person, place, and time. She exhibits normal muscle tone. Coordination normal.  Skin: No  rash noted. No erythema.  Psychiatric: She has a normal mood and affect. Her behavior is normal.     ED Treatments / Results  Labs (all labs ordered are listed, but only abnormal results are displayed) Labs Reviewed  CBC WITH DIFFERENTIAL/PLATELET - Abnormal; Notable for the following components:      Result Value   RBC 3.43 (*)    Hemoglobin 11.9 (*)    HCT 35.4 (*)    MCV 103.2 (*)    MCH 34.7 (*)    Platelets 70 (*)    All other components within normal limits  COMPREHENSIVE METABOLIC PANEL - Abnormal; Notable for the following components:   Sodium 133 (*)    Potassium 3.2 (*)    Chloride 99 (*)    BUN 21 (*)    Creatinine, Ser 1.11 (*)    Calcium 8.8 (*)    Total Protein 6.0 (*)    Albumin 2.4 (*)    AST 52 (*)    Alkaline Phosphatase 141 (*)    Total Bilirubin 2.3 (*)    GFR calc non Af Amer 47 (*)    GFR calc Af Amer 54 (*)    All other components within normal limits  PROTIME-INR - Abnormal; Notable for the following components:   Prothrombin Time 19.8 (*)    All other components within normal limits    EKG None  Radiology Dg Chest 2 View  Result Date: 09/01/2017 CLINICAL DATA:  Coughing and shortness of breath over the last 2 weeks. EXAM: CHEST - 2 VIEW COMPARISON:  07/23/2017.  06/29/2017. FINDINGS: Poor inspiration or low lung volumes. Heart size is normal. Chronic aortic atherosclerosis. Bronchial thickening pattern consistent with bronchitis, but no consolidation or lobar collapse. No effusions. Chronic degenerative changes affect the spine. Old thoracolumbar compression fractures. IMPRESSION: Possible bronchitis. No consolidation or collapse. Poor inspiration versus low lung volumes. Electronically Signed   By: Nelson Chimes M.D.   On: 09/01/2017 12:27    Procedures Procedures (including critical care time)  Medications Ordered in ED Medications  albumin human 25 % solution 50 g (has no administration in time range)     Initial Impression /  Assessment and Plan / ED Course  I have reviewed the triage vital signs and the nursing notes.  Pertinent labs & imaging results that were available during my care of the patient were reviewed by me and considered in my medical decision making (see chart for details). CRITICAL CARE Performed by: Milton Ferguson Total critical care time: 35 minutes Critical care time was exclusive of separately billable procedures and treating other patients. Critical care was necessary to treat or prevent imminent or life-threatening deterioration. Critical care was time spent personally by me on the following activities: development of treatment plan with patient and/or surrogate as well as nursing, discussions with consultants, evaluation of patient's response to treatment, examination of patient, obtaining history from patient or surrogate, ordering and performing treatments and interventions, ordering and review of laboratory studies, ordering and review of radiographic studies, pulse oximetry and re-evaluation of patient's condition.    Patient with edema and anasarca from cirrhosis.  She will be admitted to medicine and GI has been consulted and will see the patient.  She will get a paracentesis and have some diuresis  Final Clinical Impressions(s) / ED Diagnoses   Final diagnoses:  [place]    ED Discharge Orders    None       Milton Ferguson, MD 09/01/17 1428    Milton Ferguson, MD 09/17/17 0710

## 2017-09-01 NOTE — ED Notes (Signed)
Attempt call to Anneke Cundy [phone] to give update on patient status and room change.

## 2017-09-01 NOTE — Telephone Encounter (Signed)
SEE TC JUN 3.

## 2017-09-01 NOTE — Telephone Encounter (Signed)
Patient's son returned your call and he can be reached at [phone].

## 2017-09-01 NOTE — Procedures (Signed)
PreOperative Dx: Cirrhosis due to NASH, ascites Postoperative Dx: Cirrhosis due to NASH, ascites Procedure:   US guided paracentesis Radiologist:  Thornton Papas Anesthesia:  10 ml of1% lidocaine Specimen:  5 L of clear yellow ascitic fluid EBL:   < 1 ml Complications: None

## 2017-09-02 DIAGNOSIS — K7581 Nonalcoholic steatohepatitis (NASH): Principal | ICD-10-CM

## 2017-09-02 DIAGNOSIS — B369 Superficial mycosis, unspecified: Secondary | ICD-10-CM

## 2017-09-02 DIAGNOSIS — K279 Peptic ulcer, site unspecified, unspecified as acute or chronic, without hemorrhage or perforation: Secondary | ICD-10-CM

## 2017-09-02 DIAGNOSIS — K7460 Unspecified cirrhosis of liver: Secondary | ICD-10-CM

## 2017-09-02 LAB — PROTIME-INR
INR: 1.73
Prothrombin Time: 20.1 seconds — ABNORMAL HIGH (ref 11.4–15.2)

## 2017-09-02 LAB — CBC WITH DIFFERENTIAL/PLATELET
BASOS PCT: 0 %
Basophils Absolute: 0 10*3/uL (ref 0.0–0.1)
Eosinophils Absolute: 0 10*3/uL (ref 0.0–0.7)
Eosinophils Relative: 1 %
HEMATOCRIT: 33 % — AB (ref 36.0–46.0)
HEMOGLOBIN: 10.8 g/dL — AB (ref 12.0–15.0)
LYMPHS ABS: 0.9 10*3/uL (ref 0.7–4.0)
Lymphocytes Relative: 20 %
MCH: 34.6 pg — ABNORMAL HIGH (ref 26.0–34.0)
MCHC: 32.7 g/dL (ref 30.0–36.0)
MCV: 105.8 fL — ABNORMAL HIGH (ref 78.0–100.0)
MONOS PCT: 8 %
Monocytes Absolute: 0.4 10*3/uL (ref 0.1–1.0)
NEUTROS ABS: 3.3 10*3/uL (ref 1.7–7.7)
NEUTROS PCT: 71 %
Platelets: 50 10*3/uL — ABNORMAL LOW (ref 150–400)
RBC: 3.12 MIL/uL — ABNORMAL LOW (ref 3.87–5.11)
RDW: 13.4 % (ref 11.5–15.5)
WBC: 4.7 10*3/uL (ref 4.0–10.5)

## 2017-09-02 LAB — COMPREHENSIVE METABOLIC PANEL
ALT: 22 U/L (ref 14–54)
ANION GAP: 9 (ref 5–15)
AST: 43 U/L — ABNORMAL HIGH (ref 15–41)
Albumin: 3 g/dL — ABNORMAL LOW (ref 3.5–5.0)
Alkaline Phosphatase: 101 U/L (ref 38–126)
BUN: 20 mg/dL (ref 6–20)
CALCIUM: 8.5 mg/dL — AB (ref 8.9–10.3)
CHLORIDE: 101 mmol/L (ref 101–111)
CO2: 25 mmol/L (ref 22–32)
Creatinine, Ser: 0.99 mg/dL (ref 0.44–1.00)
GFR calc non Af Amer: 54 mL/min — ABNORMAL LOW (ref >60)
Glucose, Bld: 112 mg/dL — ABNORMAL HIGH (ref 65–99)
Potassium: 3.4 mmol/L — ABNORMAL LOW (ref 3.5–5.1)
SODIUM: 135 mmol/L (ref 135–145)
Total Bilirubin: 2 mg/dL — ABNORMAL HIGH (ref 0.3–1.2)
Total Protein: 5.7 g/dL — ABNORMAL LOW (ref 6.5–8.1)

## 2017-09-02 MED ORDER — POTASSIUM CHLORIDE CRYS ER 20 MEQ PO TBCR
40.0000 meq | EXTENDED_RELEASE_TABLET | Freq: Every day | ORAL | Status: DC
  Filled 2017-09-02: qty 2

## 2017-09-02 MED ORDER — KCL-LACTATED RINGERS-D5W 20 MEQ/L IV SOLN
INTRAVENOUS | Status: DC
  Administered 2017-09-02: via INTRAVENOUS
  Filled 2017-09-02 (×4): qty 1000

## 2017-09-02 MED ORDER — BOOST / RESOURCE BREEZE PO LIQD CUSTOM
1.0000 | Freq: Four times a day (QID) | ORAL | Status: DC
  Administered 2017-09-02 (×2): 1 via ORAL

## 2017-09-02 MED ORDER — POTASSIUM CHLORIDE 2 MEQ/ML IV SOLN
INTRAVENOUS | Status: DC
  Filled 2017-09-02: qty 1000

## 2017-09-02 MED ORDER — ONDANSETRON HCL 4 MG/2ML IJ SOLN
4.0000 mg | Freq: Three times a day (TID) | INTRAMUSCULAR | Status: DC
  Administered 2017-09-02 – 2017-09-03 (×3): 4 mg via INTRAVENOUS
  Filled 2017-09-02 (×3): qty 2

## 2017-09-02 MED ORDER — VITAMIN K1 10 MG/ML IJ SOLN
10.0000 mg | Freq: Once | INTRAVENOUS | Status: AC
  Administered 2017-09-02: 10 mg via INTRAVENOUS
  Filled 2017-09-02: qty 1

## 2017-09-02 MED ORDER — POLYETHYLENE GLYCOL 3350 17 G PO PACK
17.0000 g | PACK | ORAL | Status: AC
  Administered 2017-09-02 (×5): 17 g via ORAL
  Filled 2017-09-02 (×5): qty 1

## 2017-09-02 MED ORDER — SERTRALINE HCL 50 MG PO TABS
25.0000 mg | ORAL_TABLET | Freq: Every day | ORAL | Status: DC
  Administered 2017-09-02 – 2017-09-03 (×2): 25 mg via ORAL
  Filled 2017-09-02 (×2): qty 1

## 2017-09-02 MED ORDER — SPIRONOLACTONE 25 MG PO TABS
50.0000 mg | ORAL_TABLET | Freq: Two times a day (BID) | ORAL | Status: DC
  Administered 2017-09-02 – 2017-09-03 (×2): 50 mg via ORAL
  Filled 2017-09-02 (×2): qty 2

## 2017-09-02 MED ORDER — PANTOPRAZOLE SODIUM 40 MG PO TBEC
40.0000 mg | DELAYED_RELEASE_TABLET | Freq: Every day | ORAL | Status: DC
  Administered 2017-09-03: 40 mg via ORAL
  Filled 2017-09-02 (×2): qty 1

## 2017-09-02 MED ORDER — SODIUM CHLORIDE 0.9 % IV SOLN
INTRAVENOUS | Status: DC
  Administered 2017-09-02: 18:00:00 via INTRAVENOUS

## 2017-09-02 MED ORDER — FUROSEMIDE 10 MG/ML IJ SOLN
60.0000 mg | Freq: Two times a day (BID) | INTRAMUSCULAR | Status: DC
  Administered 2017-09-02 – 2017-09-03 (×2): 60 mg via INTRAVENOUS
  Filled 2017-09-02 (×2): qty 6

## 2017-09-02 NOTE — Progress Notes (Addendum)
Subjective: Nauseated after eating but no vomiting. Tolerating clear liquids. Had loose stool with Miralax but no rectal bleeding noted since admission. Tolerating Miralax prep. Feels her legs are less swollen.   Objective: Vital signs in last 24 hours: Temp:  [97.4 F (36.3 C)-98.4 F (36.9 C)] 97.6 F (36.4 C) (06/04 0636) Pulse Rate:  [71-88] 78 (06/04 0636) Resp:  [14-23] 18 (06/03 2301) BP: (86-117)/(39-87) 98/49 (06/04 0636) SpO2:  [95 %-100 %] 95 % (06/04 0636) Weight:  [195 lb 12.3 oz (88.8 kg)-205 lb 14.6 oz (93.4 kg)] 205 lb 14.6 oz (93.4 kg) (06/04 0500) Last BM Date: 09/01/17 General:   Alert and oriented, pleasant Head:  Normocephalic and atraumatic. Abdomen:  Bowel sounds present, soft, obese, non-tender, non-distended. Extremities:  With 2+ pitting edema but improved from yesterday, 1 + edema thighs, improved pedal edema  Neurologic:  Alert and  oriented x4 Psych:  Alert and cooperative. Normal mood and affect.  Intake/Output from previous day: 06/03 0701 - 06/04 0700 In: 693 [P.O.:240; I.V.:3; IV Piggyback:450] Out: 700 [Urine:700] Intake/Output this shift: No intake/output data recorded.  Lab Results: Recent Labs    09/01/17 1141  WBC 8.5  HGB 11.9*  HCT 35.4*  PLT 70*   BMET Recent Labs    09/01/17 1141  NA 133*  K 3.2*  CL 99*  CO2 27  GLUCOSE 93  BUN 21*  CREATININE 1.11*  CALCIUM 8.8*   LFT Recent Labs    09/01/17 1141  PROT 6.0*  ALBUMIN 2.4*  AST 52*  ALT 29  ALKPHOS 141*  BILITOT 2.3*   PT/INR Recent Labs    09/01/17 1141  LABPROT 19.8*  INR 1.70     Studies/Results: Dg Chest 2 View  Result Date: 09/01/2017 CLINICAL DATA:  Coughing and shortness of breath over the last 2 weeks. EXAM: CHEST - 2 VIEW COMPARISON:  07/23/2017.  06/29/2017. FINDINGS: Poor inspiration or low lung volumes. Heart size is normal. Chronic aortic atherosclerosis. Bronchial thickening pattern consistent with bronchitis, but no consolidation or  lobar collapse. No effusions. Chronic degenerative changes affect the spine. Old thoracolumbar compression fractures. IMPRESSION: Possible bronchitis. No consolidation or collapse. Poor inspiration versus low lung volumes. Electronically Signed   By: Nelson Chimes M.D.   On: 09/01/2017 12:27   US Paracentesis  Result Date: 09/01/2017 INDICATION: Recurrent ascites, cirrhosis due to NASH EXAM: ULTRASOUND GUIDED THERAPEUTIC PARACENTESIS MEDICATIONS: None. COMPLICATIONS: None immediate. PROCEDURE: Procedure, benefits, and risks of procedure were discussed with patient. Written informed consent for procedure was obtained. Time out protocol followed. Adequate collection of ascites localized by ultrasound in RIGHT lower quadrant. Skin prepped and draped in usual sterile fashion. Skin and soft tissues anesthetized with 10 mL of 1% lidocaine. 5 [place] Yueh catheter placed into peritoneal cavity. 5 L of clear yellow ascitic fluid aspirated by vacuum bottle suction. Procedure tolerated well by patient without immediate complication. FINDINGS: As above IMPRESSION: Successful ultrasound-guided paracentesis yielding 5 liters of peritoneal fluid. Electronically Signed   By: Lavonia Dana M.D.   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 Low-volume rectal bleeding for several weeks but hemoglobin remaining stable. Paracentesis X 2 in April, and paracentesis on admission with 5 liters removed. Low concern for SBP. Anasarca noted in setting of hypoalbuminemia.   Nausea, vomiting: vomiting resolved. Still with nausea. Tolerating liquids. No solid food dysphagia. Consider EGD on 6/5 at time of colonoscopy. Add  scheduled Zofran before meals.   Rectal bleeding: resolved at admission. Plans for colonoscopy 09/03/17. Tolerated 3 doses of Miralax yesterday and will need additional 6 doses today. Continue clear liquids and may have Boost QID  today.   Anasarca: improvement clinically with lower extremity edema, weight same as admission. Continue daily weights. Diuretics held yesterday evening due to paracentesis. Lasix 60 mg IV BID and aldactone 50 mg BID resumed today. Follow I/O closely.    Plan: CBC, CMP, INR today: ordered but not drawn yet Miralax every hour X 6 doses Continue diuresis Zofran before meals  Plans for colonoscopy +/- EGD on 09/03/17 with Dr. Oneida Alar NPO after midnight Continue clears today but may have Boost QID Aspirin discontinued    Melanie Needs, PhD, ANP-BC [facility]    LOS: 1 day    09/02/2017, 8:11 AM

## 2017-09-02 NOTE — Progress Notes (Signed)
PROGRESS NOTE    Melanie Porter  RDE:[id number] DOB: 1940/07/05 DOA: 09/01/2017 PCP: Sharilyn Sites, MD    Brief Narrative:   77 y.o. female with past medical history significant for [name] cirrhosis, history of depression, gastroesophageal reflux disease/peptic ulcer disease, hypercholesterolemia and hypothyroidism; who presented to the emergency department secondary to increased fatigue, shortness of breath and anasarca.  Looking into her records patient was admitted at the end of April secondary to anasarca at that time she was discharged with a weight of 210 and adjusted dosages of diuretics.  She has on April 1 2.8 L removed with paracentesis and then in April 25 she has 2.5 L removed with paracentesis as well.  Patient symptoms have been present for the last 2 weeks and is steadily worsening to the point that she was referred by her primary care doctor to the emergency department for further evaluation and treatment.  Assessment & Plan: 1-anasarca and ascites: -In the setting of [place] -Meld score 21 -Continue daily weights and strict intake and output -Patient has completed 3 dosages of albumin after paracentesis on 09/01/2017; where 5 L were removed from her peritoneal space. -Will continue IV Lasix 40 mg twice a day and continue Aldactone 50 mg daily -Low-sodium diet has been recommended for the patient, while she is preparing for colonoscopy on 09/03/2017 we will continue clear liquids. -GI service is on board and will follow the recommendations.3  2-hematochezia -GI on board and planning to perform colonoscopy on 6/5 -Hemoglobin has remained stable -Or receiving bowel prep with MiraLAX, patient reports no seen any blood. -Continue holding aspirin.  3-hyperlipidemia -Continue statins.  4-Thrombocytopenia ([place]) -Associated with cirrhosis -Continue monitoring trend -No need for transfusion currently.  5-peptic ulcer disease/GERD -Continue PPI -GI considering possible endoscopy as  well; will follow the recommendations.  6-hypothyroidism: -Continue Synthroid.  7-depression -Patient was experiencing some adverse effects to the use of Prozac. -Mood is overall stable -We will start treatment with low-dose Zoloft. -No suicidal ideation no hallucinations.  8-fungal skin infection -Continue nystatin cream.  9-physical deconditioning -Physical therapy has evaluated patient and has recommended home health services -Once patient is medically stable will follow final recommendations and assist with discharge instructions.   DVT prophylaxis: SCDs. Code Status: Full code Family Communication: Sister at bedside. Disposition Plan: Remains inpatient, continue IV Lasix, continue Aldactone, continue bowel prep with anticipated plan for colonoscopy on 6/5.  Consultants:   GI service  Procedures:   Anticipated colonoscopy on 09/03/2017 with Dr. Oneida Alar  Paracentesis 09/01/2017: yield approximately 5 L removal  See below for x-ray reports   Antimicrobials:  Anti-infectives (From admission, onward)   None      Subjective: No fever, no chest pain, no shortness of breath.  Feeling weak and tired.  Positive nausea and decreased appetite.  Objective: Vitals:   09/01/17 2301 09/02/17 0500 09/02/17 0636 09/02/17 1516  BP: (!) 91/43  (!) 98/49 (!) 108/44  Pulse: 71  78 98  Resp: 18   17  Temp:   97.6 F (36.4 C) 98.1 F (36.7 C)  TempSrc:   Oral   SpO2: 96%  95% 96%  Weight:  93.4 kg (205 lb 14.6 oz)    Height:        Intake/Output Summary (Last 24 hours) at 09/02/2017 1639 Last data filed at 09/02/2017 1300 Gross per 24 hour  Intake 853 ml  Output 1100 ml  Net -247 ml   Filed Weights   09/01/17 1402 09/01/17 1600 09/02/17 0500  Weight: 93.1 kg (205 lb 4 oz) 88.8 kg (195 lb 12.3 oz) 93.4 kg (205 lb 14.6 oz)    Examination: General exam: Alert, awake, oriented x 3; reporting some nausea and decreased appetite.  Feeling weak and tired but without focal  deficits.  Patient denies chest pain, vomiting, abdominal pain or any other bloody stools. Respiratory system: Good air movement bilaterally, no crackles, no wheezing.  Good oxygen saturation on room air. Cardiovascular system: Regular rate and rhythm, no murmurs, no gallops, no rubs.  No JVD appreciated on exam. Gastrointestinal system: Positive fluid waves demonstrating ascites (significantly improved in comparison to admission), denies abdominal pain, positive bowel sounds appreciated. Central nervous system: Alert and oriented. No focal neurological deficits. Extremities: 2+ edema bilaterally, no cyanosis, no clubbing. Skin: Patient with positive fungal rash affecting groin area and under her breasts; no open wounds. Psychiatry: Judgement and insight appear normal. Mood & affect appropriate.   Data Reviewed: I have personally reviewed following labs and imaging studies  CBC: Recent Labs  Lab 09/01/17 1141 09/02/17 1022  WBC 8.5 4.7  NEUTROABS 6.6 3.3  HGB 11.9* 10.8*  HCT 35.4* 33.0*  MCV 103.2* 105.8*  PLT 70* 50*   Basic Metabolic Panel: Recent Labs  Lab 09/01/17 1120 09/01/17 1141 09/02/17 1022  NA  --  133* 135  K  --  3.2* 3.4*  CL  --  99* 101  CO2  --  27 25  GLUCOSE  --  93 112*  BUN  --  21* 20  CREATININE  --  1.11* 0.99  CALCIUM  --  8.8* 8.5*  MG 1.6*  --   --   PHOS 3.1  --   --    GFR: Estimated Creatinine Clearance: 51.4 mL/min (by C-G formula based on SCr of 0.99 mg/dL).   Liver Function Tests: Recent Labs  Lab 09/01/17 1141 09/02/17 1022  AST 52* 43*  ALT 29 22  ALKPHOS 141* 101  BILITOT 2.3* 2.0*  PROT 6.0* 5.7*  ALBUMIN 2.4* 3.0*   Coagulation Profile: Recent Labs  Lab 09/01/17 1141 09/02/17 1058  INR 1.70 1.73   Cardiac Enzymes: No results for input(s): CKTOTAL, CKMB, CKMBINDEX, TROPONINI in the last 168 hours. BNP (last 3 results) No results for input(s): PROBNP in the last 8760 hours. HbA1C: No results for input(s): HGBA1C  in the last 72 hours. CBG: No results for input(s): GLUCAP in the last 168 hours. Lipid Profile: No results for input(s): CHOL, HDL, LDLCALC, TRIG, CHOLHDL, LDLDIRECT in the last 72 hours. Thyroid Function Tests: Recent Labs    09/01/17 1630  TSH 3.994   Urine analysis:    Component Value Date/Time   COLORURINE YELLOW 07/23/2017 [place] 07/23/2017 1509   LABSPEC 1.016 07/23/2017 1509   PHURINE 6.0 07/23/2017 1509   GLUCOSEU NEGATIVE 07/23/2017 1509   HGBUR NEGATIVE 07/23/2017 1509   BILIRUBINUR NEGATIVE 07/23/2017 1509   KETONESUR NEGATIVE 07/23/2017 1509   PROTEINUR NEGATIVE 07/23/2017 1509   UROBILINOGEN 0.2 07/27/2014 1220   NITRITE NEGATIVE 07/23/2017 1509   LEUKOCYTESUR NEGATIVE 07/23/2017 1509    Radiology Studies: Dg Chest 2 View  Result Date: 09/01/2017 CLINICAL DATA:  Coughing and shortness of breath over the last 2 weeks. EXAM: CHEST - 2 VIEW COMPARISON:  07/23/2017.  06/29/2017. FINDINGS: Poor inspiration or low lung volumes. Heart size is normal. Chronic aortic atherosclerosis. Bronchial thickening pattern consistent with bronchitis, but no consolidation or lobar collapse. No effusions. Chronic degenerative changes affect the spine.  Old thoracolumbar compression fractures. IMPRESSION: Possible bronchitis. No consolidation or collapse. Poor inspiration versus low lung volumes. Electronically Signed   By: Nelson Chimes M.D.   On: 09/01/2017 12:27   US Paracentesis  Result Date: 09/01/2017 INDICATION: Recurrent ascites, cirrhosis due to NASH EXAM: ULTRASOUND GUIDED THERAPEUTIC PARACENTESIS MEDICATIONS: None. COMPLICATIONS: None immediate. PROCEDURE: Procedure, benefits, and risks of procedure were discussed with patient. Written informed consent for procedure was obtained. Time out protocol followed. Adequate collection of ascites localized by ultrasound in RIGHT lower quadrant. Skin prepped and draped in usual sterile fashion. Skin and soft tissues  anesthetized with 10 mL of 1% lidocaine. 5 [place] Yueh catheter placed into peritoneal cavity. 5 L of clear yellow ascitic fluid aspirated by vacuum bottle suction. Procedure tolerated well by patient without immediate complication. FINDINGS: As above IMPRESSION: Successful ultrasound-guided paracentesis yielding 5 liters of peritoneal fluid. Electronically Signed   By: Lavonia Dana M.D.   On: 09/01/2017 15:58    Scheduled Meds: . feeding supplement  1 Container Oral QID  . furosemide  60 mg Intravenous Q12H  . levothyroxine  75 mcg Oral QAC breakfast  . magnesium oxide  400 mg Oral Daily  . nystatin cream   Topical BID  . ondansetron (ZOFRAN) IV  4 mg Intravenous TID AC & HS  . pantoprazole  40 mg Oral QAC breakfast  . [START ON 09/03/2017] potassium chloride SA  40 mEq Oral Daily  . sertraline  25 mg Oral Daily  . sodium chloride flush  3 mL Intravenous Q12H  . sodium chloride flush  3 mL Intravenous Q12H  . spironolactone  50 mg Oral Q12H   Continuous Infusions: . sodium chloride 250 mL (09/02/17 0301)  . lactated ringers with kcl       LOS: 1 day    Time spent: 30 minutes.      Barton Dubois, MD [facility] Pager [phone]  If 7PM-7AM, please contact night-coverage [email or web address] Password [facility] 09/02/2017, 4:39 PM

## 2017-09-02 NOTE — Progress Notes (Signed)
Labs reviewed from this morning. Hgb around baseline. Platelets 50. INR 1.73, slightly increased from yesterday. Vit K 10 mg IV received yesterday and again this morning. Recheck INR tomorrow morning.

## 2017-09-02 NOTE — Plan of Care (Signed)
  Problem: Acute Rehab PT Goals(only PT should resolve) Goal: Pt Will Go Supine/Side To Sit Outcome: Progressing Flowsheets (Taken 09/02/2017 1458) Pt will go Supine/Side to Sit: with modified independence Goal: Patient Will Transfer Sit To/From Stand Outcome: Progressing Flowsheets (Taken 09/02/2017 1458) Patient will transfer sit to/from stand: with modified independence Goal: Pt Will Transfer Bed To Chair/Chair To Bed Outcome: Progressing Flowsheets (Taken 09/02/2017 1458) Pt will Transfer Bed to Chair/Chair to Bed: with supervision Goal: Pt Will Ambulate Outcome: Progressing Flowsheets (Taken 09/02/2017 1458) Pt will Ambulate: 75 feet;with supervision  2:59 PM, 09/02/17 Lonell Grandchild, MPT Physical Therapist with [facility] [phone] office [phone] mobile phone

## 2017-09-02 NOTE — H&P (View-Only) (Signed)
Subjective: Nauseated after eating but no vomiting. Tolerating clear liquids. Had loose stool with Miralax but no rectal bleeding noted since admission. Tolerating Miralax prep. Feels her legs are less swollen.   Objective: Vital signs in last 24 hours: Temp:  [97.4 F (36.3 C)-98.4 F (36.9 C)] 97.6 F (36.4 C) (06/04 0636) Pulse Rate:  [71-88] 78 (06/04 0636) Resp:  [14-23] 18 (06/03 2301) BP: (86-117)/(39-87) 98/49 (06/04 0636) SpO2:  [95 %-100 %] 95 % (06/04 0636) Weight:  [195 lb 12.3 oz (88.8 kg)-205 lb 14.6 oz (93.4 kg)] 205 lb 14.6 oz (93.4 kg) (06/04 0500) Last BM Date: 09/01/17 General:   Alert and oriented, pleasant Head:  Normocephalic and atraumatic. Abdomen:  Bowel sounds present, soft, obese, non-tender, non-distended. Extremities:  With 2+ pitting edema but improved from yesterday, 1 + edema thighs, improved pedal edema  Neurologic:  Alert and  oriented x4 Psych:  Alert and cooperative. Normal mood and affect.  Intake/Output from previous day: 06/03 0701 - 06/04 0700 In: 693 [P.O.:240; I.V.:3; IV Piggyback:450] Out: 700 [Urine:700] Intake/Output this shift: No intake/output data recorded.  Lab Results: Recent Labs    09/01/17 1141  WBC 8.5  HGB 11.9*  HCT 35.4*  PLT 70*   BMET Recent Labs    09/01/17 1141  NA 133*  K 3.2*  CL 99*  CO2 27  GLUCOSE 93  BUN 21*  CREATININE 1.11*  CALCIUM 8.8*   LFT Recent Labs    09/01/17 1141  PROT 6.0*  ALBUMIN 2.4*  AST 52*  ALT 29  ALKPHOS 141*  BILITOT 2.3*   PT/INR Recent Labs    09/01/17 1141  LABPROT 19.8*  INR 1.70     Studies/Results: Dg Chest 2 View  Result Date: 09/01/2017 CLINICAL DATA:  Coughing and shortness of breath over the last 2 weeks. EXAM: CHEST - 2 VIEW COMPARISON:  07/23/2017.  06/29/2017. FINDINGS: Poor inspiration or low lung volumes. Heart size is normal. Chronic aortic atherosclerosis. Bronchial thickening pattern consistent with bronchitis, but no consolidation or  lobar collapse. No effusions. Chronic degenerative changes affect the spine. Old thoracolumbar compression fractures. IMPRESSION: Possible bronchitis. No consolidation or collapse. Poor inspiration versus low lung volumes. Electronically Signed   By: Nelson Chimes M.D.   On: 09/01/2017 12:27   US Paracentesis  Result Date: 09/01/2017 INDICATION: Recurrent ascites, cirrhosis due to NASH EXAM: ULTRASOUND GUIDED THERAPEUTIC PARACENTESIS MEDICATIONS: None. COMPLICATIONS: None immediate. PROCEDURE: Procedure, benefits, and risks of procedure were discussed with patient. Written informed consent for procedure was obtained. Time out protocol followed. Adequate collection of ascites localized by ultrasound in RIGHT lower quadrant. Skin prepped and draped in usual sterile fashion. Skin and soft tissues anesthetized with 10 mL of 1% lidocaine. 5 [place] Yueh catheter placed into peritoneal cavity. 5 L of clear yellow ascitic fluid aspirated by vacuum bottle suction. Procedure tolerated well by patient without immediate complication. FINDINGS: As above IMPRESSION: Successful ultrasound-guided paracentesis yielding 5 liters of peritoneal fluid. Electronically Signed   By: Lavonia Dana M.D.   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 Low-volume rectal bleeding for several weeks but hemoglobin remaining stable. Paracentesis X 2 in April, and paracentesis on admission with 5 liters removed. Low concern for SBP. Anasarca noted in setting of hypoalbuminemia.   Nausea, vomiting: vomiting resolved. Still with nausea. Tolerating liquids. No solid food dysphagia. Consider EGD on 6/5 at time of colonoscopy. Add  scheduled Zofran before meals.   Rectal bleeding: resolved at admission. Plans for colonoscopy 09/03/17. Tolerated 3 doses of Miralax yesterday and will need additional 6 doses today. Continue clear liquids and may have Boost QID  today.   Anasarca: improvement clinically with lower extremity edema, weight same as admission. Continue daily weights. Diuretics held yesterday evening due to paracentesis. Lasix 60 mg IV BID and aldactone 50 mg BID resumed today. Follow I/O closely.    Plan: CBC, CMP, INR today: ordered but not drawn yet Miralax every hour X 6 doses Continue diuresis Zofran before meals  Plans for colonoscopy +/- EGD on 09/03/17 with Dr. Oneida Alar NPO after midnight Continue clears today but may have Boost QID Aspirin discontinued    Annitta Needs, PhD, ANP-BC [facility]    LOS: 1 day    09/02/2017, 8:11 AM

## 2017-09-02 NOTE — Evaluation (Signed)
Physical Therapy Evaluation Patient Details Name: Melanie Porter MRN: [id number] DOB: [birth year] Today's Date: 09/02/2017   History of Present Illness  [name] is a 77 y.o. female with past medical history significant for Melanie Porter cirrhosis, history of depression, gastroesophageal reflux disease/peptic ulcer disease, hypercholesterolemia and hypothyroidism; who presented to the emergency department secondary to increased fatigue, shortness of breath and anasarca.  Looking into her records patient was admitted at the end of April secondary to anasarca at that time she was discharged with a weight of 210 and adjusted dosages of diuretics.  She has on April 1 2.8 L removed with paracentesis and then in April 25 she has 2.5 L removed with paracentesis as well.  Patient symptoms have been present for the last 2 weeks and is steadily worsening to the point that she was referred by her primary care doctor to the emergency department for further evaluation and treatment.    Clinical Impression  Patient limited for functional mobility as stated below secondary to BLE weakness, fatigue and fair/poor standing balance.  Patient will benefit from continued physical therapy in hospital and recommended venue below to increase strength, balance, endurance for safe ADLs and gait.     Follow Up Recommendations Home health PT;Supervision for mobility/OOB    Equipment Recommendations  None recommended by PT    Recommendations for Other Services       Precautions / Restrictions Precautions Precautions: Fall Restrictions Weight Bearing Restrictions: No      Mobility  Bed Mobility Overal bed mobility: Needs Assistance Bed Mobility: Supine to Sit     Supine to sit: Min guard     General bed mobility comments: slow slightly labored movement  Transfers Overall transfer level: Needs assistance Equipment used: Rolling walker (2 wheeled) Transfers: Sit to/from [facility] Sit to  Stand: Supervision Stand pivot transfers: Supervision       General transfer comment: slightly labored movement  Ambulation/Gait Ambulation/Gait assistance: Min guard Ambulation Distance (Feet): 45 Feet Assistive device: Rolling walker (2 wheeled) Gait Pattern/deviations: Decreased step length - right;Decreased step length - left;Decreased stride length Gait velocity: decreased   General Gait Details: slightly labored slow cadence without loss of balance, limited secondary to c/o fatigue  Stairs            Wheelchair Mobility    Modified Rankin (Stroke Patients Only)       Balance Overall balance assessment: Needs assistance Sitting-balance support: Feet supported;No upper extremity supported Sitting balance-Leahy Scale: Good     Standing balance support: Bilateral upper extremity supported;During functional activity Standing balance-Leahy Scale: Fair                               Pertinent Vitals/Pain Pain Assessment: 0-10 Pain Score: 5  Pain Location: left lower abdomen/groin area Pain Descriptors / Indicators: Aching Pain Intervention(s): Limited activity within patient's tolerance;Monitored during session    [place] expects to be discharged to:: Private residence Living Arrangements: Alone Available Help at Discharge: Family;Available PRN/intermittently Type of Home: House Home Access: Ramped entrance     Home Layout: One level Home Equipment: Walker - 2 wheels;Shower seat;Wheelchair - manual;Cane - single point;Walker - 4 wheels      Prior Function Level of Independence: Independent with assistive device(s)         Comments: household distances using RW, does not use Rollator due to big per patient     Hand Dominance  Extremity/Trunk Assessment   Upper Extremity Assessment Upper Extremity Assessment: Overall WFL for tasks assessed    Lower Extremity Assessment Lower Extremity Assessment:  Generalized weakness    Cervical / Trunk Assessment Cervical / Trunk Assessment: Normal  Communication   Communication: No difficulties  Cognition Arousal/Alertness: Awake/alert Behavior During Therapy: WFL for tasks assessed/performed Overall Cognitive Status: Within Functional Limits for tasks assessed                                        General Comments      Exercises     Assessment/Plan    PT Assessment Patient needs continued PT services  PT Problem List Decreased strength;Decreased activity tolerance;Decreased balance;Decreased mobility       PT Treatment Interventions Gait training;Stair training;Functional mobility training;Therapeutic activities;Therapeutic exercise;Patient/family education    PT Goals (Current goals can be found in the Care Plan section)  Acute Rehab PT Goals Patient Stated Goal: return home with family to assist PT Goal Formulation: With patient/family Time For Goal Achievement: 09/09/17 Potential to Achieve Goals: Good    Frequency Min 3X/week   Barriers to discharge        Co-evaluation               AM-PAC PT "6 Clicks" Daily Activity  Outcome Measure Difficulty turning over in bed (including adjusting bedclothes, sheets and blankets)?: None Difficulty moving from lying on back to sitting on the side of the bed? : A Little Difficulty sitting down on and standing up from a chair with arms (e.g., wheelchair, bedside commode, etc,.)?: A Little Help needed moving to and from a bed to chair (including a wheelchair)?: A Little Help needed walking in hospital room?: A Little Help needed climbing 3-5 steps with a railing? : A Little 6 Click Score: 19    End of Session   Activity Tolerance: Patient tolerated treatment well;Patient limited by fatigue Patient left: in chair;with call bell/phone within reach;with family/visitor present(Patient left sitting on BSC ) Nurse Communication: Mobility status(nursing staff  notified patient left on BSC) PT Visit Diagnosis: Unsteadiness on feet (R26.81);Other abnormalities of gait and mobility (R26.89);Muscle weakness (generalized) (M62.81)    Time: 5597-4163 PT Time Calculation (min) (ACUTE ONLY): 25 min   Charges:   PT Evaluation $PT Eval Moderate Complexity: 1 Mod PT Treatments $Therapeutic Activity: 23-37 mins   PT G Codes:        2:57 PM, 2017/09/15 Lonell Grandchild, MPT Physical Therapist with [facility] [phone] office [phone] mobile phone

## 2017-09-03 ENCOUNTER — Encounter (HOSPITAL_COMMUNITY)
Admission: EM | Disposition: A | Discharge status: Hospice | Payer: Self-pay | Source: Ambulatory Visit | Attending: Pulmonary Disease

## 2017-09-03 ENCOUNTER — Inpatient Hospital Stay (HOSPITAL_COMMUNITY): Payer: PPO | Admitting: Anesthesiology

## 2017-09-03 ENCOUNTER — Inpatient Hospital Stay (HOSPITAL_COMMUNITY): Payer: PPO

## 2017-09-03 ENCOUNTER — Encounter (HOSPITAL_COMMUNITY): Payer: Self-pay | Admitting: Anesthesiology

## 2017-09-03 DIAGNOSIS — Z9049 Acquired absence of other specified parts of digestive tract: Secondary | ICD-10-CM

## 2017-09-03 DIAGNOSIS — K921 Melena: Secondary | ICD-10-CM

## 2017-09-03 DIAGNOSIS — R197 Diarrhea, unspecified: Secondary | ICD-10-CM

## 2017-09-03 DIAGNOSIS — K625 Hemorrhage of anus and rectum: Secondary | ICD-10-CM

## 2017-09-03 HISTORY — PX: COLONOSCOPY: SHX5424

## 2017-09-03 HISTORY — PX: PARTIAL COLECTOMY: SHX5273

## 2017-09-03 HISTORY — PX: ESOPHAGOGASTRODUODENOSCOPY: SHX5428

## 2017-09-03 HISTORY — PX: LAPAROTOMY: SHX154

## 2017-09-03 LAB — COMPREHENSIVE METABOLIC PANEL
ALBUMIN: 2.7 g/dL — AB (ref 3.5–5.0)
ALT: 22 U/L (ref 14–54)
ALT: 23 U/L (ref 14–54)
AST: 41 U/L (ref 15–41)
AST: 37 U/L (ref 15–41)
Albumin: 2.6 g/dL — ABNORMAL LOW (ref 3.5–5.0)
Alkaline Phosphatase: 105 U/L (ref 38–126)
Alkaline Phosphatase: 84 U/L (ref 38–126)
Anion gap: 4 — ABNORMAL LOW (ref 5–15)
Anion gap: 9 (ref 5–15)
BILIRUBIN TOTAL: 1.7 mg/dL — AB (ref 0.3–1.2)
BUN: 18 mg/dL (ref 6–20)
BUN: 20 mg/dL (ref 6–20)
CALCIUM: 8.3 mg/dL — AB (ref 8.9–10.3)
CHLORIDE: 103 mmol/L (ref 101–111)
CO2: 27 mmol/L (ref 22–32)
CO2: 24 mmol/L (ref 22–32)
CREATININE: 1.12 mg/dL — AB (ref 0.44–1.00)
Calcium: 8.5 mg/dL — ABNORMAL LOW (ref 8.9–10.3)
Chloride: 102 mmol/L (ref 101–111)
Creatinine, Ser: 1.4 mg/dL — ABNORMAL HIGH (ref 0.44–1.00)
GFR calc Af Amer: 54 mL/min — ABNORMAL LOW (ref >60)
GFR calc Af Amer: 41 mL/min — ABNORMAL LOW (ref >60)
GFR calc non Af Amer: 35 mL/min — ABNORMAL LOW (ref >60)
GFR, EST NON AFRICAN AMERICAN: 46 mL/min — AB (ref >60)
GLUCOSE: 138 mg/dL — AB (ref 65–99)
Glucose, Bld: 118 mg/dL — ABNORMAL HIGH (ref 65–99)
POTASSIUM: 3.6 mmol/L (ref 3.5–5.1)
Potassium: 3.1 mmol/L — ABNORMAL LOW (ref 3.5–5.1)
Sodium: 133 mmol/L — ABNORMAL LOW (ref 135–145)
Sodium: 136 mmol/L (ref 135–145)
TOTAL PROTEIN: 5 g/dL — AB (ref 6.5–8.1)
Total Bilirubin: 1.8 mg/dL — ABNORMAL HIGH (ref 0.3–1.2)
Total Protein: 5.3 g/dL — ABNORMAL LOW (ref 6.5–8.1)

## 2017-09-03 LAB — CBC
HEMATOCRIT: 24.2 % — AB (ref 36.0–46.0)
HEMOGLOBIN: 8.1 g/dL — AB (ref 12.0–15.0)
MCH: 35.1 pg — ABNORMAL HIGH (ref 26.0–34.0)
MCHC: 33.5 g/dL (ref 30.0–36.0)
MCV: 104.8 fL — AB (ref 78.0–100.0)
Platelets: 129 10*3/uL — ABNORMAL LOW (ref 150–400)
RBC: 2.31 MIL/uL — AB (ref 3.87–5.11)
RDW: 13.3 % (ref 11.5–15.5)
WBC: 10.8 10*3/uL — AB (ref 4.0–10.5)

## 2017-09-03 LAB — MRSA PCR SCREENING: MRSA BY PCR: NEGATIVE

## 2017-09-03 LAB — HEMOGLOBIN AND HEMATOCRIT, BLOOD
HEMATOCRIT: 27.2 % — AB (ref 36.0–46.0)
HEMOGLOBIN: 9.4 g/dL — AB (ref 12.0–15.0)

## 2017-09-03 LAB — FIBRINOGEN: Fibrinogen: 184 mg/dL — ABNORMAL LOW (ref 210–475)

## 2017-09-03 LAB — PROTIME-INR
INR: 1.8
INR: 1.65
PROTHROMBIN TIME: 20.7 s — AB (ref 11.4–15.2)
Prothrombin Time: 19.4 seconds — ABNORMAL HIGH (ref 11.4–15.2)

## 2017-09-03 LAB — APTT: aPTT: 36 seconds (ref 24–36)

## 2017-09-03 LAB — PREPARE RBC (CROSSMATCH)

## 2017-09-03 SURGERY — COLONOSCOPY
Anesthesia: Moderate Sedation

## 2017-09-03 SURGERY — LAPAROTOMY, EXPLORATORY
Anesthesia: General

## 2017-09-03 MED ORDER — FENTANYL CITRATE (PF) 100 MCG/2ML IJ SOLN
INTRAMUSCULAR | Status: AC
  Filled 2017-09-03: qty 2

## 2017-09-03 MED ORDER — SODIUM CHLORIDE 0.9 % IV SOLN
1.0000 g | INTRAVENOUS | Status: DC
  Administered 2017-09-05: 1 g via INTRAVENOUS
  Filled 2017-09-03 (×4): qty 1

## 2017-09-03 MED ORDER — PROPOFOL 10 MG/ML IV BOLUS
INTRAVENOUS | Status: DC | PRN
  Administered 2017-09-03: 50 mg via INTRAVENOUS

## 2017-09-03 MED ORDER — POVIDONE-IODINE 10 % EX OINT
TOPICAL_OINTMENT | CUTANEOUS | Status: DC | PRN
  Administered 2017-09-03: 1 via TOPICAL

## 2017-09-03 MED ORDER — FAMOTIDINE IN NACL 20-0.9 MG/50ML-% IV SOLN
20.0000 mg | Freq: Two times a day (BID) | INTRAVENOUS | Status: DC
  Administered 2017-09-04 – 2017-09-08 (×11): 20 mg via INTRAVENOUS
  Filled 2017-09-03 (×11): qty 50

## 2017-09-03 MED ORDER — ONDANSETRON 4 MG PO TBDP
4.0000 mg | ORAL_TABLET | Freq: Four times a day (QID) | ORAL | Status: DC | PRN
  Administered 2017-09-09: 4 mg via ORAL
  Filled 2017-09-03: qty 1

## 2017-09-03 MED ORDER — ROCURONIUM BROMIDE 100 MG/10ML IV SOLN
INTRAVENOUS | Status: DC | PRN
  Administered 2017-09-03: 15 mg via INTRAVENOUS

## 2017-09-03 MED ORDER — PHENYLEPHRINE HCL 10 MG/ML IJ SOLN
INTRAMUSCULAR | Status: DC | PRN
  Administered 2017-09-03: 40 ug via INTRAVENOUS
  Administered 2017-09-03 (×3): 80 ug via INTRAVENOUS
  Administered 2017-09-03: 40 ug via INTRAVENOUS
  Administered 2017-09-03: 80 ug via INTRAVENOUS

## 2017-09-03 MED ORDER — SODIUM CHLORIDE 0.9 % IJ SOLN
INTRAMUSCULAR | Status: AC
  Filled 2017-09-03: qty 10

## 2017-09-03 MED ORDER — DOPAMINE-DEXTROSE 3.2-5 MG/ML-% IV SOLN: 5.0000 ug/kg/min | INTRAVENOUS | Status: DC

## 2017-09-03 MED ORDER — NOREPINEPHRINE 4 MG/250ML-% IV SOLN
0.0000 ug/min | INTRAVENOUS | Status: DC
  Administered 2017-09-03: 2 ug/min via INTRAVENOUS
  Administered 2017-09-04 (×2): 10 ug/min via INTRAVENOUS
  Administered 2017-09-05: 12 ug/min via INTRAVENOUS
  Administered 2017-09-05: 14 ug/min via INTRAVENOUS
  Administered 2017-09-05: 15 ug/min via INTRAVENOUS
  Administered 2017-09-05: 17 ug/min via INTRAVENOUS
  Filled 2017-09-03 (×9): qty 250

## 2017-09-03 MED ORDER — SODIUM CHLORIDE 0.9 % IV SOLN: Freq: Once | INTRAVENOUS | Status: DC

## 2017-09-03 MED ORDER — PIPERACILLIN-TAZOBACTAM 3.375 G IVPB: 3.3750 g | Freq: Three times a day (TID) | INTRAVENOUS | Status: DC

## 2017-09-03 MED ORDER — HYDROMORPHONE HCL 1 MG/ML IJ SOLN: 0.2500 mg | INTRAMUSCULAR | Status: DC | PRN

## 2017-09-03 MED ORDER — MIDAZOLAM HCL 5 MG/5ML IJ SOLN
INTRAMUSCULAR | Status: DC | PRN
  Administered 2017-09-03 (×3): 1 mg via INTRAVENOUS

## 2017-09-03 MED ORDER — ACETAMINOPHEN 650 MG RE SUPP: 650.0000 mg | Freq: Four times a day (QID) | RECTAL | Status: DC | PRN

## 2017-09-03 MED ORDER — LACTATED RINGERS IV SOLN
INTRAVENOUS | Status: DC
  Administered 2017-09-03 – 2017-09-04 (×2): via INTRAVENOUS

## 2017-09-03 MED ORDER — CHLORHEXIDINE GLUCONATE CLOTH 2 % EX PADS
6.0000 | MEDICATED_PAD | Freq: Every day | CUTANEOUS | Status: DC
  Administered 2017-09-04 – 2017-09-10 (×7): 6 via TOPICAL

## 2017-09-03 MED ORDER — SODIUM CHLORIDE 0.9 % IV SOLN
INTRAVENOUS | Status: AC
  Filled 2017-09-03: qty 1

## 2017-09-03 MED ORDER — SUGAMMADEX SODIUM 200 MG/2ML IV SOLN
INTRAVENOUS | Status: DC | PRN
  Administered 2017-09-03: 200 mg via INTRAVENOUS

## 2017-09-03 MED ORDER — SODIUM CHLORIDE 0.9 % IV SOLN
INTRAVENOUS | Status: DC
  Administered 2017-09-03: 1000 mL via INTRAVENOUS

## 2017-09-03 MED ORDER — MEPERIDINE HCL 50 MG/ML IJ SOLN: 6.2500 mg | INTRAMUSCULAR | Status: DC | PRN

## 2017-09-03 MED ORDER — LORAZEPAM 2 MG/ML IJ SOLN: 0.5000 mg | INTRAMUSCULAR | Status: DC | PRN

## 2017-09-03 MED ORDER — ONDANSETRON HCL 4 MG/2ML IJ SOLN
4.0000 mg | Freq: Once | INTRAMUSCULAR | Status: AC | PRN
  Administered 2017-09-03: 4 mg via INTRAVENOUS

## 2017-09-03 MED ORDER — MEPERIDINE HCL 100 MG/ML IJ SOLN
INTRAMUSCULAR | Status: AC
  Filled 2017-09-03: qty 2

## 2017-09-03 MED ORDER — CHLORHEXIDINE GLUCONATE CLOTH 2 % EX PADS
6.0000 | MEDICATED_PAD | Freq: Once | CUTANEOUS | Status: AC
  Administered 2017-09-03: 6 via TOPICAL

## 2017-09-03 MED ORDER — SODIUM CHLORIDE 0.9% FLUSH: 10.0000 mL | INTRAVENOUS | Status: DC | PRN

## 2017-09-03 MED ORDER — MIDAZOLAM HCL 2 MG/2ML IJ SOLN
INTRAMUSCULAR | Status: AC
  Filled 2017-09-03: qty 2

## 2017-09-03 MED ORDER — ROCURONIUM BROMIDE 50 MG/5ML IV SOLN
INTRAVENOUS | Status: AC
  Filled 2017-09-03: qty 1

## 2017-09-03 MED ORDER — SODIUM CHLORIDE 0.9 % IV SOLN
1.0000 g | Freq: Once | INTRAVENOUS | Status: AC
  Administered 2017-09-03: 1 g via INTRAVENOUS

## 2017-09-03 MED ORDER — MEPERIDINE HCL 100 MG/ML IJ SOLN
INTRAMUSCULAR | Status: DC | PRN
  Administered 2017-09-03 (×2): 25 mg via INTRAVENOUS

## 2017-09-03 MED ORDER — MIDAZOLAM HCL 5 MG/5ML IJ SOLN
INTRAMUSCULAR | Status: AC
  Filled 2017-09-03: qty 10

## 2017-09-03 MED ORDER — NOREPINEPHRINE 4 MG/250ML-% IV SOLN: 0.0000 ug/min | INTRAVENOUS | Status: DC

## 2017-09-03 MED ORDER — IOPAMIDOL (ISOVUE-300) INJECTION 61%
75.0000 mL | Freq: Once | INTRAVENOUS | Status: AC | PRN
Start: 2017-09-03 — End: 2017-09-03
  Administered 2017-09-03: 22:00:00 via INTRAVENOUS

## 2017-09-03 MED ORDER — SODIUM CHLORIDE 0.9 % IV SOLN: INTRAVENOUS | Status: DC

## 2017-09-03 MED ORDER — KETOROLAC TROMETHAMINE 30 MG/ML IJ SOLN: 30.0000 mg | Freq: Once | INTRAMUSCULAR | Status: DC | PRN

## 2017-09-03 MED ORDER — POVIDONE-IODINE 10 % EX OINT
TOPICAL_OINTMENT | CUTANEOUS | Status: AC
  Filled 2017-09-03: qty 1

## 2017-09-03 MED ORDER — EPHEDRINE SULFATE 50 MG/ML IJ SOLN
INTRAMUSCULAR | Status: AC
  Filled 2017-09-03: qty 1

## 2017-09-03 MED ORDER — PHENYLEPHRINE 40 MCG/ML (10ML) SYRINGE FOR IV PUSH (FOR BLOOD PRESSURE SUPPORT)
PREFILLED_SYRINGE | INTRAVENOUS | Status: AC
  Filled 2017-09-03: qty 10

## 2017-09-03 MED ORDER — LEVOTHYROXINE SODIUM 100 MCG PO TABS
100.0000 ug | ORAL_TABLET | Freq: Every day | ORAL | Status: DC
  Administered 2017-09-04 – 2017-09-10 (×7): 100 ug via ORAL
  Filled 2017-09-03 (×7): qty 1

## 2017-09-03 MED ORDER — MIDAZOLAM HCL 5 MG/5ML IJ SOLN
INTRAMUSCULAR | Status: DC | PRN
  Administered 2017-09-03: 1 mg via INTRAVENOUS

## 2017-09-03 MED ORDER — POTASSIUM CHLORIDE CRYS ER 20 MEQ PO TBCR
40.0000 meq | EXTENDED_RELEASE_TABLET | ORAL | Status: DC
  Administered 2017-09-03: 40 meq via ORAL
  Filled 2017-09-03: qty 2

## 2017-09-03 MED ORDER — PROPOFOL 10 MG/ML IV BOLUS
INTRAVENOUS | Status: AC
  Filled 2017-09-03: qty 40

## 2017-09-03 MED ORDER — HEMOSTATIC AGENTS (NO CHARGE) OPTIME
TOPICAL | Status: DC | PRN
  Administered 2017-09-03: 1 via TOPICAL

## 2017-09-03 MED ORDER — BUPIVACAINE LIPOSOME 1.3 % IJ SUSP
INTRAMUSCULAR | Status: AC
  Filled 2017-09-03: qty 20

## 2017-09-03 MED ORDER — SODIUM CHLORIDE 0.9% FLUSH
10.0000 mL | Freq: Two times a day (BID) | INTRAVENOUS | Status: DC
  Administered 2017-09-04 – 2017-09-07 (×8): 10 mL
  Administered 2017-09-07: 30 mL
  Administered 2017-09-08 – 2017-09-10 (×4): 10 mL

## 2017-09-03 MED ORDER — SUGAMMADEX SODIUM 200 MG/2ML IV SOLN
INTRAVENOUS | Status: AC
  Filled 2017-09-03: qty 2

## 2017-09-03 MED ORDER — CHLORHEXIDINE GLUCONATE CLOTH 2 % EX PADS: 6.0000 | MEDICATED_PAD | Freq: Once | CUTANEOUS | Status: DC

## 2017-09-03 MED ORDER — LACTATED RINGERS IV SOLN
INTRAVENOUS | Status: DC | PRN
  Administered 2017-09-03: 14:00:00 via INTRAVENOUS

## 2017-09-03 MED ORDER — ACETAMINOPHEN 325 MG PO TABS: 650.0000 mg | ORAL_TABLET | Freq: Four times a day (QID) | ORAL | Status: DC | PRN

## 2017-09-03 MED ORDER — HYDROCODONE-ACETAMINOPHEN 7.5-325 MG PO TABS: 1.0000 | ORAL_TABLET | Freq: Once | ORAL | Status: DC | PRN

## 2017-09-03 MED ORDER — ONDANSETRON HCL 4 MG/2ML IJ SOLN
4.0000 mg | Freq: Four times a day (QID) | INTRAMUSCULAR | Status: DC | PRN
  Administered 2017-09-08: 4 mg via INTRAVENOUS
  Filled 2017-09-03: qty 2

## 2017-09-03 MED ORDER — SUCCINYLCHOLINE CHLORIDE 20 MG/ML IJ SOLN
INTRAMUSCULAR | Status: DC | PRN
  Administered 2017-09-03: 100 mg via INTRAVENOUS

## 2017-09-03 MED ORDER — SODIUM CHLORIDE 0.9 % IR SOLN
Status: DC | PRN
  Administered 2017-09-03: 2000 mL
  Administered 2017-09-03: 1000 mL

## 2017-09-03 MED ORDER — FENTANYL CITRATE (PF) 100 MCG/2ML IJ SOLN
INTRAMUSCULAR | Status: DC | PRN
  Administered 2017-09-03 (×2): 25 ug via INTRAVENOUS

## 2017-09-03 MED ORDER — STERILE WATER FOR IRRIGATION IR SOLN
Status: DC | PRN
  Administered 2017-09-03: 12:00:00

## 2017-09-03 MED ORDER — SODIUM CHLORIDE 0.9% FLUSH
INTRAVENOUS | Status: AC
  Filled 2017-09-03: qty 10

## 2017-09-03 MED ORDER — LIDOCAINE VISCOUS HCL 2 % MT SOLN
OROMUCOSAL | Status: AC
  Filled 2017-09-03: qty 15

## 2017-09-03 MED ORDER — BUPIVACAINE LIPOSOME 1.3 % IJ SUSP
INTRAMUSCULAR | Status: DC | PRN
  Administered 2017-09-03: 20 mL

## 2017-09-03 MED ORDER — HYDROMORPHONE HCL 1 MG/ML IJ SOLN
1.0000 mg | INTRAMUSCULAR | Status: DC | PRN
  Administered 2017-09-05: 1 mg via INTRAVENOUS
  Filled 2017-09-03: qty 1

## 2017-09-03 MED ORDER — SUCCINYLCHOLINE CHLORIDE 20 MG/ML IJ SOLN
INTRAMUSCULAR | Status: AC
  Filled 2017-09-03: qty 1

## 2017-09-03 SURGICAL SUPPLY — 40 items
APPLIER CLIP 11 MED OPEN (CLIP) ×4
BLADE 15 SAFETY STRL DISP (BLADE) ×4 IMPLANT
CLIP APPLIE 11 MED OPEN (CLIP) ×2 IMPLANT
COVER LIGHT HANDLE STERIS (MISCELLANEOUS) ×8 IMPLANT
DRSG OPSITE POSTOP 4X10 (GAUZE/BANDAGES/DRESSINGS) ×4 IMPLANT
ELECT REM PT RETURN 9FT ADLT (ELECTROSURGICAL) ×4
ELECTRODE REM PT RTRN 9FT ADLT (ELECTROSURGICAL) ×2 IMPLANT
EVACUATOR DRAINAGE 10X20 100CC (DRAIN) ×2 IMPLANT
EVACUATOR SILICONE 100CC (DRAIN) ×2
GLOVE BIO SURGEON STRL SZ7 (GLOVE) ×8 IMPLANT
GLOVE BIOGEL PI IND STRL 7.0 (GLOVE) ×10 IMPLANT
GLOVE BIOGEL PI INDICATOR 7.0 (GLOVE) ×10
GLOVE ECLIPSE 6.5 STRL STRAW (GLOVE) ×8 IMPLANT
GLOVE SURG SS PI 7.5 STRL IVOR (GLOVE) ×4 IMPLANT
GOWN STRL REUS W/TWL LRG LVL3 (GOWN DISPOSABLE) ×12 IMPLANT
HEMOSTAT SURGICEL 4X8 (HEMOSTASIS) ×4 IMPLANT
INST SET MAJOR GENERAL (KITS) ×4 IMPLANT
KIT TURNOVER KIT A (KITS) ×4 IMPLANT
LIGASURE IMPACT 36 18CM CVD LR (INSTRUMENTS) ×4 IMPLANT
MANIFOLD NEPTUNE II (INSTRUMENTS) ×4 IMPLANT
NEEDLE HYPO 18GX1.5 BLUNT FILL (NEEDLE) ×4 IMPLANT
NS IRRIG 1000ML POUR BTL (IV SOLUTION) ×12 IMPLANT
PACK COLON (CUSTOM PROCEDURE TRAY) ×4 IMPLANT
PAD ARMBOARD 7.5X6 YLW CONV (MISCELLANEOUS) ×4 IMPLANT
RELOAD PROXIMATE 75MM BLUE (ENDOMECHANICALS) ×8 IMPLANT
RETRACTOR WND ALEXIS 25 LRG (MISCELLANEOUS) ×2 IMPLANT
RTRCTR WOUND ALEXIS 25CM LRG (MISCELLANEOUS) ×4
SET BASIN LINEN APH (SET/KITS/TRAYS/PACK) ×4 IMPLANT
SPONGE DRAIN TRACH 4X4 STRL 2S (GAUZE/BANDAGES/DRESSINGS) ×4 IMPLANT
SPONGE LAP 18X18 X RAY DECT (DISPOSABLE) ×8 IMPLANT
SPONGE SURGIFOAM ABS GEL 100 (HEMOSTASIS) ×4 IMPLANT
STAPLER GUN LINEAR PROX 60 (STAPLE) ×4 IMPLANT
STAPLER PROXIMATE 75MM BLUE (STAPLE) ×4 IMPLANT
STAPLER VISISTAT (STAPLE) ×4 IMPLANT
SUT ETHILON 3 0 FSL (SUTURE) ×4 IMPLANT
SUT NOVA NAB GS-26 0 60 (SUTURE) ×8 IMPLANT
SUT SILK 3 0 SH CR/8 (SUTURE) ×4 IMPLANT
TAPE CLOTH SURG 4X10 WHT LF (GAUZE/BANDAGES/DRESSINGS) ×4 IMPLANT
TRAY FOLEY MTR SLVR 16FR STAT (SET/KITS/TRAYS/PACK) ×4 IMPLANT
YANKAUER SUCT BULB TIP 10FT TU (MISCELLANEOUS) ×4 IMPLANT

## 2017-09-03 NOTE — Progress Notes (Addendum)
PROGRESS NOTE  Melanie Porter JJH:[id number] DOB: March 20, 1941 DOA: 09/01/2017 PCP: Sharilyn Sites, MD  Brief History:  77 year old female with a history of hyperlipidemia,NASH Cirrhosis, splenomegaly, hypothyroidism presenting with worsening lower extremity edema, increasing abdominal girth, and nausea and fatigue. She was seen in Dr. Oneida Alar' office on 07/23/17 and sent to ED due to anasarca. The patient was recently admitted to the hospital from 06/29/2017 through 07/29/2017 for decompensated liver cirrhosis. She had a paracentesis on 06/30/2017 removing 2.8 L.   She had another subsequent hospitalizationfrom 07/23/2017 through 07/29/2017. During that hospitalization, the patient had another paracentesis on 07/24/2017 removing 2.5 L.  She was -18 L for the admission.  Her discharge weight was 2010 pounds.  Her maintenance dose of furosemide was increased to 80 mg twice daily and Spironolactone was increased to 50 mg twice daily.  She endorses compliance with her diet as well as her medications.    Assessment/Plan: Decompensated liver cirrhosis with anasarca -Holding Lasix and aldactone due to soft BP/hypotension -6/3/19paracentesis-->5L removed, albumin given -06/30/2017 echo--EF 65-70%, no WMA, grade 1 DD -doubt decompensated CHF as EF is perserved -Check urine protein creatinine ratio--0.21 -daily weights--NEG 18 lbs for this admission -discharge weight 210 lbs -increase maintenance dose Lasix to 80 mg po bid  Hematochezia -Hgb stable -baseline Hgb ~10-11 -appreciate GI -Hgb 10.8 on day of d/c  Perforated colon-iatrogenic -10/31/83 complication of colonoscopy--aborted -09/03/17 partial colectomy -continue zosyn -received ertapenem in periop period  Post Op Bleeding -suspect possible DIC -discussed with Dr. Mauro Kaufmann units FFP -2 units platelets  Thrombocytopenia -Secondary to liver cirrhosis -Monitor for signs of bleeding -overall stable  Hypothyroidism -continue  Synthroid  Depression/anxiety -Continue fluoxetine  Hyperlipidemia -Continue statin  Hypokalemia -repleted  Hypomagnesemia -repleted  Goals of Care -discussed with son -palliative consulted -changed to DNR    Disposition Plan:   ICU Family Communication:   Son updated at bedside 6/5--Total time spent 35 minutes.  Greater than 50% spent face to face counseling and coordinating care. The patient is critically ill with multiple organ systems failure and requires high complexity decision making for assessment and support, frequent evaluation and titration of therapies, application of advanced monitoring technologies and extensive interpretation of multiple databases.  Critical care time - 35 mins.    Consultants:  GI, general surgery  Code Status: / DNR  DVT Prophylaxis:  SCDs   Procedures: As Listed in Progress Note Above  Antibiotics: None    Subjective: Postoperatively, the patient is awake but somnolent.  There is no vomiting.  Pain is controlled.  No respiratory distress.  Objective: Vitals:   09/03/17 1600 09/03/17 1615 09/03/17 1630 09/03/17 1645  BP: (!) 96/27 98/81 (!) 74/26 (!) 113/50  Pulse: 74 73 73 73  Resp: (!) 21 20 15 12   Temp:      TempSrc:      SpO2: 100% 100% 99% 100%  Weight:      Height:        Intake/Output Summary (Last 24 hours) at 09/03/2017 1727 Last data filed at 09/03/2017 1652 Gross per 24 hour  Intake 1811.67 ml  Output 4985 ml  Net -3173.33 ml   Weight change: 0.313 kg (11 oz) Exam:   General:  Pt is alert, does not follow commands appropriately, not in acute distress  HEENT: No icterus, No thrush, No neck mass, [place]/AT  Cardiovascular: RRR, S1/S2, no rubs, no gallops  Respiratory: Bibasilar crackles.  No wheezing.  Good air movement.  Abdomen: Soft/+BS, non tender, non distended, no guarding  Extremities: 2 + LE edema, No lymphangitis, No petechiae, No rashes, no synovitis   Data Reviewed: I have personally  reviewed following labs and imaging studies Basic Metabolic Panel: Recent Labs  Lab 09/01/17 1120 09/01/17 1141 09/02/17 1022 09/03/17 0438  NA  --  133* 135 133*  K  --  3.2* 3.4* 3.1*  CL  --  99* 101 102  CO2  --  27 25 27   GLUCOSE  --  93 112* 118*  BUN  --  21* 20 18  CREATININE  --  1.11* 0.99 1.12*  CALCIUM  --  8.8* 8.5* 8.3*  MG 1.6*  --   --   --   PHOS 3.1  --   --   --    Liver Function Tests: Recent Labs  Lab 09/01/17 1141 09/02/17 1022 09/03/17 0438  AST 52* 43* 41  ALT 29 22 22   ALKPHOS 141* 101 105  BILITOT 2.3* 2.0* 1.8*  PROT 6.0* 5.7* 5.0*  ALBUMIN 2.4* 3.0* 2.6*   No results for input(s): LIPASE, AMYLASE in the last 168 hours. No results for input(s): AMMONIA in the last 168 hours. Coagulation Profile: Recent Labs  Lab 09/01/17 1141 09/02/17 1058 09/03/17 0438  INR 1.70 1.73 1.80   CBC: Recent Labs  Lab 09/01/17 1141 09/02/17 1022 09/03/17 1649  WBC 8.5 4.7  --   NEUTROABS 6.6 3.3  --   HGB 11.9* 10.8* 9.4*  HCT 35.4* 33.0* 27.2*  MCV 103.2* 105.8*  --   PLT 70* 50*  --    Cardiac Enzymes: No results for input(s): CKTOTAL, CKMB, CKMBINDEX, TROPONINI in the last 168 hours. BNP: Invalid input(s): POCBNP CBG: No results for input(s): GLUCAP in the last 168 hours. HbA1C: No results for input(s): HGBA1C in the last 72 hours. Urine analysis:    Component Value Date/Time   COLORURINE YELLOW 07/23/2017 [place] 07/23/2017 1509   LABSPEC 1.016 07/23/2017 1509   PHURINE 6.0 07/23/2017 1509   GLUCOSEU NEGATIVE 07/23/2017 1509   HGBUR NEGATIVE 07/23/2017 1509   BILIRUBINUR NEGATIVE 07/23/2017 1509   KETONESUR NEGATIVE 07/23/2017 1509   PROTEINUR NEGATIVE 07/23/2017 1509   UROBILINOGEN 0.2 07/27/2014 1220   NITRITE NEGATIVE 07/23/2017 1509   LEUKOCYTESUR NEGATIVE 07/23/2017 1509   Sepsis Labs: @LABRCNTIP (procalcitonin:4,lacticidven:4) )No results found for this or any previous visit (from the past 240 hour(s)).     Scheduled Meds: . Chlorhexidine Gluconate Cloth  6 each Topical Once   And  . Chlorhexidine Gluconate Cloth  6 each Topical Once  . [START ON 09/04/2017] levothyroxine  100 mcg Oral QAC breakfast  . levothyroxine  75 mcg Oral QAC breakfast  . lidocaine      . meperidine      . midazolam      . nystatin cream   Topical BID  . sodium chloride flush  3 mL Intravenous Q12H  . sodium chloride flush  3 mL Intravenous Q12H   Continuous Infusions: . sodium chloride 250 mL (09/02/17 0301)  . sodium chloride 1,000 mL (09/03/17 1150)  . sodium chloride    . DOPamine    . [START ON 09/04/2017] ertapenem (INVANZ) IV    . famotidine (PEPCID) IV    . lactated ringers      Procedures/Studies: Dg Chest 2 View  Result Date: 09/01/2017 CLINICAL DATA:  Coughing and shortness of breath over the last 2 weeks. EXAM: CHEST - 2 VIEW COMPARISON:  07/23/2017.  06/29/2017. FINDINGS: Poor inspiration or low lung volumes. Heart size is normal. Chronic aortic atherosclerosis. Bronchial thickening pattern consistent with bronchitis, but no consolidation or lobar collapse. No effusions. Chronic degenerative changes affect the spine. Old thoracolumbar compression fractures. IMPRESSION: Possible bronchitis. No consolidation or collapse. Poor inspiration versus low lung volumes. Electronically Signed   By: Nelson Chimes M.D.   On: 09/01/2017 12:27   US Paracentesis  Result Date: 09/01/2017 INDICATION: Recurrent ascites, cirrhosis due to NASH EXAM: ULTRASOUND GUIDED THERAPEUTIC PARACENTESIS MEDICATIONS: None. COMPLICATIONS: None immediate. PROCEDURE: Procedure, benefits, and risks of procedure were discussed with patient. Written informed consent for procedure was obtained. Time out protocol followed. Adequate collection of ascites localized by ultrasound in RIGHT lower quadrant. Skin prepped and draped in usual sterile fashion. Skin and soft tissues anesthetized with 10 mL of 1% lidocaine. 5 [place] Yueh catheter placed into  peritoneal cavity. 5 L of clear yellow ascitic fluid aspirated by vacuum bottle suction. Procedure tolerated well by patient without immediate complication. FINDINGS: As above IMPRESSION: Successful ultrasound-guided paracentesis yielding 5 liters of peritoneal fluid. Electronically Signed   By: Lavonia Dana M.D.   On: 09/01/2017 15:58    Orson Eva, DO  [facility] Pager [phone]  If 7PM-7AM, please contact night-coverage [email or web address] Password [email or web address] 09/03/2017, 5:27 PM   LOS: 2 days

## 2017-09-03 NOTE — Op Note (Signed)
Patient:  Melanie Porter  DOB:  09-May-1940  MRN:  [id number]   Preop Diagnosis: Need for central venous access for pressor support  Postop Diagnosis: Same  Procedure: Right femoral vein triple-lumen catheter placement  Surgeon: Aviva Signs, MD  Anes: Local  Indications: Patient is a 77 year old black female who is on pressure support in the ICU.  She needs central venous access.  The risks and benefits of the procedure were explained to the patient verbally, who gave informed consent.  Patient is unable to sign due to her critical state.  This is an emergency procedure.  Procedure note: The right groin was prepped and draped using usual sterile technique with ChloraPrep.  Surgical site confirmation was performed.  1% Xylocaine was used for local anesthesia.  A mask, gown, gloves, and sterile drape were all used.  The right femoral vein was accessed using the Seldinger technique without difficulty.  A guidewire was then advanced into the right femoral vein without difficulty.  An introducer was placed over the guidewire.  The catheter was then inserted through the guidewire and guidewire was removed.  Good backflow of blood was noted from all 3 ports.  All 3 ports were flushed with saline.  A dry sterile dressing was applied.  Complications: None  EBL: Minimal

## 2017-09-03 NOTE — Plan of Care (Signed)
Palliative; Melanie Porter is not in her bed, nursing staff state that she has gone for colonoscopy. Chart review throughout the day shows that Melanie Porter has needed emergency surgery. Palliative medicine team will follow up tomorrow. No charge Quinn Axe, NP Palliative Medicine Team Team Phone # [phone]

## 2017-09-03 NOTE — Anesthesia Procedure Notes (Signed)
Procedure Name: Intubation Date/Time: 09/03/2017 2:06 PM Performed by: Charmaine Downs, CRNA Pre-anesthesia Checklist: Patient identified, Patient being monitored, Emergency Drugs available and Suction available Patient Re-evaluated:Patient Re-evaluated prior to induction Oxygen Delivery Method: Circle System Utilized Preoxygenation: Pre-oxygenation with 100% oxygen Induction Type: IV induction Ventilation: Mask ventilation without difficulty Laryngoscope Size: Mac and 4 Grade View: Grade II Tube type: Oral Tube size: 7.0 mm Number of attempts: 1 Airway Equipment and Method: stylet Placement Confirmation: ETT inserted through vocal cords under direct vision,  positive ETCO2 and breath sounds checked- equal and bilateral Secured at: 22 cm Tube secured with: Tape Dental Injury: Teeth and Oropharynx as per pre-operative assessment

## 2017-09-03 NOTE — Anesthesia Procedure Notes (Signed)
Central Venous Catheter Insertion Performed by: Nicanor Alcon, MD, anesthesiologist Start/End6/07/2017 1:40 PM, 09/03/2017 1:50 PM Patient location: Pre-op. Preanesthetic checklist: patient identified, IV checked, site marked, risks and benefits discussed, surgical consent, monitors and equipment checked, pre-op evaluation, timeout performed and anesthesia consent Position: Trendelenburg Lidocaine 1% used for infiltration Hand hygiene performed , maximum sterile barriers used  and Seldinger technique used Catheter size: 7.5 Fr Total catheter length 15. Triple lumen Procedure performed without using ultrasound guided technique. Attempts: 1 Following insertion, line sutured, dressing applied and Biopatch. Post procedure assessment: blood return through all ports, free fluid flow and no air  Patient tolerated the procedure well with no immediate complications. Additional procedure comments: Initial angiocath placed and transduced to confirm venous placement.  Lines flushed with NACl.

## 2017-09-03 NOTE — Progress Notes (Addendum)
Subjective:  Patient states her stool are all liquid/watery. No vomiting.   Objective: Vital signs in last 24 hours: Temp:  [98.1 F (36.7 C)-98.2 F (36.8 C)] 98.1 F (36.7 C) (06/05 0529) Pulse Rate:  [72-98] 72 (06/05 0529) Resp:  [16-19] 19 (06/05 0529) BP: (95-108)/(44-56) 95/44 (06/05 0529) SpO2:  [92 %-100 %] 92 % (06/05 0529) Weight:  [205 lb 11 oz (93.3 kg)] 205 lb 11 oz (93.3 kg) (06/05 0529) Last BM Date: 09/02/17 General:   Alert,   pleasant and cooperative in NAD Head:  Normocephalic and atraumatic. Eyes:  Sclera clear, no icterus.  Abdomen:  Soft, mild upper abd tenderness and nondistended. Normal bowel sounds, without guarding, and without rebound.   Extremities:  1-2+ pitting edema to knees bilaterally. No clubbing or deformity. Neurologic:  Alert and  oriented x4;  grossly normal neurologically. Skin:  Intact without significant lesions or rashes. Psych:  Alert and cooperative. Normal mood and affect.  Intake/Output from previous day: 06/04 0701 - 06/05 0700 In: 1271.7 [P.O.:960; I.V.:311.7] Out: 900 [Urine:900] Intake/Output this shift: No intake/output data recorded.  Lab Results: CBC Recent Labs    09/01/17 1141 09/02/17 1022  WBC 8.5 4.7  HGB 11.9* 10.8*  HCT 35.4* 33.0*  MCV 103.2* 105.8*  PLT 70* 50*   BMET Recent Labs    09/01/17 1141 09/02/17 1022 09/03/17 0438  NA 133* 135 133*  K 3.2* 3.4* 3.1*  CL 99* 101 102  CO2 27 25 27   GLUCOSE 93 112* 118*  BUN 21* 20 18  CREATININE 1.11* 0.99 1.12*  CALCIUM 8.8* 8.5* 8.3*   LFTs Recent Labs    09/01/17 1141 09/02/17 1022 09/03/17 0438  BILITOT 2.3* 2.0* 1.8*  ALKPHOS 141* 101 105  AST 52* 43* 41  ALT 29 22 22   PROT 6.0* 5.7* 5.0*  ALBUMIN 2.4* 3.0* 2.6*   No results for input(s): LIPASE in the last 72 hours. PT/INR Recent Labs    09/01/17 1141 09/02/17 1058 09/03/17 0438  LABPROT 19.8* 20.1* 20.7*  INR 1.70 1.73 1.80      Imaging Studies: Dg Chest 2 View  Result  Date: 09/01/2017 CLINICAL DATA:  Coughing and shortness of breath over the last 2 weeks. EXAM: CHEST - 2 VIEW COMPARISON:  07/23/2017.  06/29/2017. FINDINGS: Poor inspiration or low lung volumes. Heart size is normal. Chronic aortic atherosclerosis. Bronchial thickening pattern consistent with bronchitis, but no consolidation or lobar collapse. No effusions. Chronic degenerative changes affect the spine. Old thoracolumbar compression fractures. IMPRESSION: Possible bronchitis. No consolidation or collapse. Poor inspiration versus low lung volumes. Electronically Signed   By: Nelson Chimes M.D.   On: 09/01/2017 12:27   US Paracentesis  Result Date: 09/01/2017 INDICATION: Recurrent ascites, cirrhosis due to NASH EXAM: ULTRASOUND GUIDED THERAPEUTIC PARACENTESIS MEDICATIONS: None. COMPLICATIONS: None immediate. PROCEDURE: Procedure, benefits, and risks of procedure were discussed with patient. Written informed consent for procedure was obtained. Time out protocol followed. Adequate collection of ascites localized by ultrasound in RIGHT lower quadrant. Skin prepped and draped in usual sterile fashion. Skin and soft tissues anesthetized with 10 mL of 1% lidocaine. 5 [place] Yueh catheter placed into peritoneal cavity. 5 L of clear yellow ascitic fluid aspirated by vacuum bottle suction. Procedure tolerated well by patient without immediate complication. FINDINGS: As above IMPRESSION: Successful ultrasound-guided paracentesis yielding 5 liters of peritoneal fluid. Electronically Signed   By: Lavonia Dana M.D.   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 Low-volume rectal bleeding for several weeks but hemoglobin remaining stable. Paracentesis X 2 in April, and paracentesis on admission with 5 liters removed. Low concern for SBP. Anasarca noted in setting of hypoalbuminemia.   Nausea, vomiting: vomiting resolved.  Still with nausea. Tolerating liquids. No solid food dysphagia. Continue scheduled Zofran before meals.   Rectal bleeding: resolved at admission. Plans for colonoscopy today.   Hypokalemia: Potassium being repleted.   Anasarca: improvement clinically with lower extremity edema, weight same as admission. Continue daily weights.  Follow I/O closely.    Plan: 1. Colonoscopy +/- EGD today.  2. Continue current diuretic regimen.  3. Patient receiving enemas now, will discuss results from enemas with nursing staff once complete.   Laureen Ochs. Bernarda Caffey [facility] [phone] 6/5/20199:04 AM     LOS: 2 days    Addendum: one enema complete. No stool return. Some clear liquid and small amount of fresh blood per nursing staff.   Laureen Ochs. Bernarda Caffey [facility] [phone] 6/5/201910:04 AM

## 2017-09-03 NOTE — Progress Notes (Signed)
Called about patient concerning possible misplacement of right internal jugular line.  CT scan of the chest reveals that the line has entered the right common carotid artery and the tip is in the aortic arch.  Discussed with Dr. Trula Slade of vascular surgery.  Patient is not stable to be transferred at this time.  Per his recommendation, a pressure bag has been placed on the line to keep it from clotting.  As patient is cirrhotic and we have been trying to reverse her coagulopathy, heparinization is not indicated.  I have also placed a right femoral vein triple-lumen catheter for venous access.  Will consider transfer in the morning to [facility] for removal of the right IJ by vascular surgery pending patient's stability.

## 2017-09-03 NOTE — Interval H&P Note (Signed)
History and Physical Interval Note:  09/03/2017 11:49 AM  Melanie Porter  has presented today for surgery, with the diagnosis of rectal bleeding, nausea, history of cirrhosis  The various methods of treatment have been discussed with the patient and family. After consideration of risks, benefits and other options for treatment, the patient has consented to  Procedure(s): COLONOSCOPY (N/A) ESOPHAGOGASTRODUODENOSCOPY (EGD) (N/A) as a surgical intervention .  The patient's history has been reviewed, patient examined, no change in status, stable for surgery.  I have reviewed the patient's chart and labs.  Questions were answered to the patient's satisfaction.     [employer]

## 2017-09-03 NOTE — Op Note (Addendum)
[facility] Patient Name: Melanie Porter Procedure Date: 09/03/2017 11:55 AM MRN: [id number] Date of Birth: 10/02/1940 Attending MD: Barney Drain MD, MD CSN: [id number] Age: 77 Admit Type: Inpatient Procedure:                Colonoscopy, INCOMPLETE/ABORTED Indications:              Clinically significant diarrhea of unexplained                            origin, Hematochezia, PMHx: CIRRHOSIS, ASCITES Providers:                Barney Drain MD, MD, Otis Peak B. Sharon Seller, RN, Janeece Riggers, RN, Nelma Rothman, Technician, Aram Candela Referring MD:             Halford Chessman MD, MD Medicines:                Meperidine 50 mg IV, Midazolam 3 mg IV, INVANZ IV Complications:            Perforation: ADVANCED SCOPE THROUGH REDUNDANT                            RECTOSIGMOID COLON. SCOPE ADVANCED TO TRANSVERSE                            COLON AND AS SCOPE ADVANCED INTO THE TRANSVERSE                            COLON. I FELT A RELEASE IN TENSION ON THE SCOPE.                            SCOPE IMMEDIATELY WITHDRAWN AND COPIOUS AMOUNT OF                            LIQUID IN LUMEN. SURGERY CONSULTED. ZOSYN ORDERED                            BUT NOT AVAILABLE. INVANZ GIVEN. ASPIRATED >2 Ls OF                            PERITONEAL FLUID. UNABLE TO COMPLETELY ASSESS                            REGION DUE TO LARGE AMOUNT OF LIQUID IN AREA OF                            SUSPECTED PERFORATION. SURGERY ARRIVED. ABLE TO                            CONFIRM PERFORATION. SCOPE COMPLETELY WITHRDRAWN.                            PT TRASNPORTED TO PREOP AREA FOR EMERGENT  SURGERY. Estimated Blood Loss:     Estimated blood loss was minimal. Procedure:                Pre-Anesthesia Assessment:                           - Prior to the procedure, a History and Physical                            was performed, and patient medications and                            allergies were reviewed. The patient's  tolerance of                            previous anesthesia was also reviewed. The risks                            and benefits of the procedure and the sedation                            options and risks were discussed with the patient.                            All questions were answered, and informed consent                            was obtained. Prior Anticoagulants: The patient has                            taken aspirin, last dose was 3 days prior to                            procedure. ASA Grade Assessment: II - A patient                            with mild systemic disease. After reviewing the                            risks and benefits, the patient was deemed in                            satisfactory condition to undergo the procedure.                            After obtaining informed consent, the colonoscope                            was passed under direct vision. Throughout the                            procedure, the patient's blood pressure, pulse, and  oxygen saturations were monitored continuously. The                            EC-[id number] ([id number]) scope was introduced through                            the anus and advanced to the the transverse colon                            for evaluation. This was NOT the intended extent.                            The colonoscopy was technically difficult and                            complex due to a tortuous colon. Procedure ABORTED                            DUE TO POSSIBLE PERFORATION WHICH WAS CONFIRMED BY                            DIRECT VISUALIZATION. The patient tolerated the                            procedure fairly well. The quality of the bowel                            preparation was excellent. No anatomical landmarks                            were photographed. THE PT WAS TRANSPORTED TO THE                            PREOP AREA IN STABLE CONDITION. Scope In: 12:20:55  PM Scope Out: 12:32:40 PM Total Procedure Duration: 0 hours 11 minutes 45 seconds  Findings:      The recto-sigmoid colon and sigmoid colon revealed significantly       excessive looping. Impression:               - There was significant looping of the colon. Moderate Sedation:      Moderate (conscious) sedation was administered by the endoscopy nurse       and supervised by the endoscopist. The following parameters were       monitored: oxygen saturation, heart rate, blood pressure, and response       to care. Total physician intraservice time was 22 minutes. Recommendation:           - NPO.                           - Continue present medications.                           - Transport patient to [place].                           -  Refer to a surgeon today.                           - DISCUSSED PEFORATION WITH SISTERS, SON: JOHN ASH,                            AND DAUGHTER: NICOLE ASH. Procedure Code(s):        --- Professional ---                           [phone], 60, Colonoscopy, flexible; diagnostic,                            including collection of specimen(s) by brushing or                            washing, when performed (separate procedure)                           G0500, Moderate sedation services provided by the                            same physician or other qualified health care                            professional performing a gastrointestinal                            endoscopic service that sedation supports,                            requiring the presence of an independent trained                            observer to assist in the monitoring of the                            patient's level of consciousness and physiological                            status; initial 15 minutes of intra-service time;                            patient age 32 years or older (additional time may                            be reported with [phone], as  appropriate) Diagnosis Code(s):        --- Professional ---                           R19.7, Diarrhea, unspecified                           K92.1, Melena (includes Hematochezia) CPT copyright 2017 American Medical Association. All rights reserved. The codes documented in this  report are preliminary and upon coder review may  be revised to meet current compliance requirements. Barney Drain, MD Barney Drain MD, MD 09/03/2017 1:02:18 PM This report has been signed electronically. Number of Addenda: 0

## 2017-09-03 NOTE — Consult Note (Signed)
Asked to see patient urgently in endoscopy suite by Dr. Oneida Alar for a bowel perforation during a colonoscopy.  The bowel perforation is visible by endoscopy and appears to be in the descending to sigmoid colon region.  Patient has multiple medical problems including cirrhosis and thrombocytopenia.  She does need urgent exploratory laparotomy.  I explained to her sisters that she may end up with a colostomy.  She will also need platelet transfusion postoperatively due to her thrombocytopenia.  I told them the risks and benefits of the procedure including bleeding, infection, cardiopulmonary difficulties, and death.  Patient is unable to sign consent as she is under sedation.  The sister does understand the need for emergent surgery.

## 2017-09-03 NOTE — Progress Notes (Signed)
Patient was given a unit of platelets in PACU . Patient is to receive another unit of platelets and 2 units of blood once in ICU. Patient has JP drain that is filling up as soon as it is emptied. Dr. Arnoldo Morale made aware and checked on patient and stated that patient would drain like that for the next 48 hours due to ascites. Patient was started on Dopamine drip due to low blood pressure. Patient transferred to ICU.

## 2017-09-03 NOTE — Transfer of Care (Signed)
Immediate Anesthesia Transfer of Care Note  Patient: Melanie Porter  Procedure(s) Performed: EXPLORATORY LAPAROTOMY (N/A ) PARTIAL COLECTOMY  Patient Location: PACU  Anesthesia Type:General  Level of Consciousness: awake  Airway & Oxygen Therapy: Patient Spontanous Breathing and Patient connected to face mask oxygen  Post-op Assessment: Report given to RN  Post vital signs: Reviewed and stable  Last Vitals:  Vitals Value Taken Time  BP 97/33 09/03/2017  3:50 PM  Temp    Pulse 73 09/03/2017  3:57 PM  Resp 19 09/03/2017  3:57 PM  SpO2 100 % 09/03/2017  3:57 PM  Vitals shown include unvalidated device data.  Last Pain:  Vitals:   09/03/17 1352  TempSrc: Oral  PainSc:       Patients Stated Pain Goal: 0 (38/87/19 5974)  Complications: No apparent anesthesia complications

## 2017-09-03 NOTE — OR Nursing (Signed)
Patient transported to Short Stay  to be prepped for emergency surgery. Report given to [facility] RN.

## 2017-09-03 NOTE — Op Note (Signed)
Patient:  Melanie Porter  DOB:  [birth year]  MRN:  [id number]   Preop Diagnosis: Perforated colon, peritonitis  Postop Diagnosis: Iatrogenic perforation of distal sigmoid colon  Procedure: Exploratory laparotomy, partial colectomy  Surgeon: Aviva Signs, MD  Anes: General endotracheal  Indications: Patient is a 77 year old black female with a history of cirrhosis requiring paracentesis and thrombocytopenia who was undergoing a colonoscopy for blood per rectum and suffered a perforated distal colon during the procedure.  I was asked to see the patient emergently and the hole was easily visible on endoscopy.  The patient now comes to the operating room for exploratory laparotomy.  The risks and benefits of the procedure including bleeding, infection, the need for blood products, cardiopulmonary difficulties, possible colostomy, and death were fully explained to the patient's family, who understand the risks for the emergency surgery.  I was unable to get consent from the patient due to her still under the effects of monitored anesthesia care and endoscopy.  Procedure note: The patient was placed in supine position.  After induction of general endotracheal anesthesia, the abdomen was prepped and draped using the usual sterile technique with DuraPrep.  Surgical site confirmation was performed.  The midline incision was made from above the umbilicus to the suprapubic region.  The peritoneal cavity was entered into without difficulty.  There was free air in the abdomen.  There was a significant amount of ascites present.  This was evacuated without difficulty.  There was no stool or feculent material within the abdominal cavity.  The colon was inspected from the splenic flexure down to the rectum.  Multiple diverticuli were seen.  Ultimately, the tear in the sigmoid colon was found after relieving an S shaped adhesion of the distal bowel to the vaginal cuff from a previous hysterectomy.  The  antimesenteric border of the colon had a little elongated tear in that, precluding primary closure.  As there was no significant spillage of colonic contents and the patient had already undergone bowel preparation for the colonoscopy, a primary anastomosis was performed.  A GIA stapler was placed proximally and distally across the injured area.  The mesentery was divided using the LigaSure.  Care was taken to avoid the left ureter.  Approximately 6 to 8 cm of bowel was resected.  A side-to-side colocolostomy was performed using a GIA 75 stapler.  The staple line was bolstered using 3-0 silk sutures.  A bleeding was controlled using Bovie electrocautery and medium size clips.  The raw surface area from the previous hysterectomy site was inspected and no significant bleeding was noted.  Gelfoam and Surgicel were placed along the peritoneal reflection anteriorly.  A #10 flat Jackson-Pratt drain was placed into the pelvic gutter posteriorly around the anastomosis.  It was brought out through separate stab wound to the right of the incision.  It was secured to the skin level using a 3-0 nylon interrupted suture.  All operating personnel then changed her gown and gloves.  A new set-up was used for closure.  The fascia was reapproximated using a looped 0 Novafil running suture.  Exparel was instilled into the surrounding wound.  The skin was closed using staples.  Betadine ointment and dry sterile dressings were applied.  All tape and needle counts were correct at the end of the procedure.  The patient was extubated in the operating room and went to the recovery room in guarded but stable condition.  Complications: None  EBL: 100 cc  Specimen: Distal sigmoid colon

## 2017-09-03 NOTE — Progress Notes (Signed)
**Note De-Identified  Obfuscation** IS was not instructed at this time due to patient sleepiness and need for high O2.  RRT to continue to monitor.

## 2017-09-03 NOTE — Care Management Note (Addendum)
Case Management Note  Patient Details  Name: DEMYA SCRUGGS MRN: [id number] Date of Birth: 1940-11-07  Subjective/Objective:  Anasarca.    Pt from home, lives alone, ind with ADL's. Has PCP, drives herself, does her own shopping. She has insurance with drug coverage. Recommended for [facility] PT. Patient asks for [place].                    Action/Plan: [name] of [facility] notified and will obtain orders when available.   ADDENDUM: Patient has been transferred to [facility]. Discussed patient with [name] of [place] on 09/04/2017. Patient was sent home last admission with home health orders with [facility] and [place] refused to sign orders, stating "they do not sign orders for [facility]. Prior to this admissions recent events, patient had elected [facility] again, patient will need to be notified that Larene Pickett will not accept referral from [facility], she may need to pick another [place] if she is to go home with home health at time of discharge.   Expected Discharge Date:    09/05/2017              Expected Discharge Plan:  [place]  In-House Referral:     Discharge planning Services  CM Consult  Post Acute Care Choice:    Choice offered to:     DME Arranged:    DME Agency:     HH Arranged:   PT HH Agency:   [place]  Status of Service:  In process, will continue to follow  If discussed at Long Length of Stay Meetings, dates discussed:    Additional Comments:  Mykiah Schmuck, Chauncey Reading, RN 09/03/2017, 1:41 PM

## 2017-09-03 NOTE — Progress Notes (Signed)
PT Cancellation Note  Patient Details Name: Melanie Porter MRN: [id number] DOB: April 15, 1940   Cancelled Treatment:        Teena Irani 09/03/2017, 10:48 AM  Physical Therapy Note  Patient Details  Name: Melanie Porter MRN: [id number] Date of Birth: [birth year] Today's Date: 09/03/2017    Nursing in with patient prepping for colonoscopy this morning.  To withhold treatment today and resume tomorrow.     Teena Irani, PTA/CLT [phone]  Roseanne Reno B 09/03/2017, 10:48 AM

## 2017-09-03 NOTE — Anesthesia Preprocedure Evaluation (Signed)
Anesthesia Evaluation  Patient identified by MRN, date of birth, ID band Patient awake    Reviewed: Allergy & Precautions, H&P , NPO status , Patient's Chart, lab work & pertinent test results  Airway Mallampati: II  TM Distance: >3 FB Neck ROM: full    Dental no notable dental hx.    Pulmonary neg pulmonary ROS,    Pulmonary exam normal breath sounds clear to auscultation       Cardiovascular Exercise Tolerance: Good negative cardio ROS  + Valvular Problems/Murmurs  Rhythm:regular Rate:Normal     Neuro/Psych PSYCHIATRIC DISORDERS Anxiety Depression  Neuromuscular disease negative neurological ROS  negative psych ROS   GI/Hepatic negative GI ROS, Neg liver ROS, PUD, GERD  ,(+) Hepatitis -Nonalcoholic steatohepatitis ([facility])   Endo/Other  negative endocrine ROSHypothyroidism   Renal/GU negative Renal ROS  negative genitourinary   Musculoskeletal   Abdominal   Peds  Hematology negative hematology ROS (+)   Anesthesia Other Findings Cirrhosis of liver with ascites (HCC) Anasarca    Reproductive/Obstetrics negative OB ROS                             Anesthesia Physical Anesthesia Plan  ASA: IV and emergent  Anesthesia Plan: General   Post-op Pain Management:    Induction:   PONV Risk Score and Plan:   Airway Management Planned:   Additional Equipment:   Intra-op Plan:   Post-operative Plan:   Informed Consent: I have reviewed the patients History and Physical, chart, labs and discussed the procedure including the risks, benefits and alternatives for the proposed anesthesia with the patient or authorized representative who has indicated his/her understanding and acceptance.   Dental Advisory Given  Plan Discussed with: CRNA  Anesthesia Plan Comments:         Anesthesia Quick Evaluation

## 2017-09-04 ENCOUNTER — Encounter (HOSPITAL_COMMUNITY)
Admission: EM | Disposition: A | Discharge status: Hospice | Payer: Self-pay | Source: Ambulatory Visit | Attending: Pulmonary Disease

## 2017-09-04 ENCOUNTER — Inpatient Hospital Stay (HOSPITAL_COMMUNITY): Payer: PPO | Admitting: Anesthesiology

## 2017-09-04 ENCOUNTER — Encounter (HOSPITAL_COMMUNITY): Payer: Self-pay

## 2017-09-04 DIAGNOSIS — Z452 Encounter for adjustment and management of vascular access device: Secondary | ICD-10-CM

## 2017-09-04 DIAGNOSIS — Z7189 Other specified counseling: Secondary | ICD-10-CM

## 2017-09-04 DIAGNOSIS — R571 Hypovolemic shock: Secondary | ICD-10-CM

## 2017-09-04 DIAGNOSIS — S15091A Other specified injury of right carotid artery, initial encounter: Secondary | ICD-10-CM

## 2017-09-04 DIAGNOSIS — Z515 Encounter for palliative care: Secondary | ICD-10-CM

## 2017-09-04 DIAGNOSIS — K7460 Unspecified cirrhosis of liver: Secondary | ICD-10-CM

## 2017-09-04 HISTORY — PX: ENDARTERECTOMY: SHX5162

## 2017-09-04 LAB — PREPARE PLATELET PHERESIS: Unit division: 0

## 2017-09-04 LAB — BPAM PLATELET PHERESIS
BLOOD PRODUCT EXPIRATION DATE: 201906062359
ISSUE DATE / TIME: 201906051600
UNIT TYPE AND RH: 8400

## 2017-09-04 LAB — PROCALCITONIN: Procalcitonin: 5.4 ng/mL

## 2017-09-04 LAB — MAGNESIUM: Magnesium: 1.3 mg/dL — ABNORMAL LOW (ref 1.7–2.4)

## 2017-09-04 LAB — CBC
HEMATOCRIT: 24.5 % — AB (ref 36.0–46.0)
HEMOGLOBIN: 8.3 g/dL — AB (ref 12.0–15.0)
MCH: 35.3 pg — ABNORMAL HIGH (ref 26.0–34.0)
MCHC: 33.9 g/dL (ref 30.0–36.0)
MCV: 104.3 fL — AB (ref 78.0–100.0)
Platelets: 127 10*3/uL — ABNORMAL LOW (ref 150–400)
RBC: 2.35 MIL/uL — ABNORMAL LOW (ref 3.87–5.11)
RDW: 13.2 % (ref 11.5–15.5)
WBC: 12.7 10*3/uL — AB (ref 4.0–10.5)

## 2017-09-04 LAB — PROTIME-INR
INR: 1.62
Prothrombin Time: 19.1 seconds — ABNORMAL HIGH (ref 11.4–15.2)

## 2017-09-04 LAB — PREPARE FRESH FROZEN PLASMA
UNIT DIVISION: 0
Unit division: 0

## 2017-09-04 LAB — COMPREHENSIVE METABOLIC PANEL
ALBUMIN: 2.7 g/dL — AB (ref 3.5–5.0)
ALT: 21 U/L (ref 14–54)
AST: 33 U/L (ref 15–41)
Alkaline Phosphatase: 81 U/L (ref 38–126)
Anion gap: 7 (ref 5–15)
BUN: 21 mg/dL — ABNORMAL HIGH (ref 6–20)
CHLORIDE: 103 mmol/L (ref 101–111)
CO2: 25 mmol/L (ref 22–32)
Calcium: 8.4 mg/dL — ABNORMAL LOW (ref 8.9–10.3)
Creatinine, Ser: 1.28 mg/dL — ABNORMAL HIGH (ref 0.44–1.00)
GFR calc non Af Amer: 40 mL/min — ABNORMAL LOW (ref >60)
GFR, EST AFRICAN AMERICAN: 46 mL/min — AB (ref >60)
GLUCOSE: 160 mg/dL — AB (ref 65–99)
Potassium: 3.8 mmol/L (ref 3.5–5.1)
SODIUM: 135 mmol/L (ref 135–145)
Total Bilirubin: 1.6 mg/dL — ABNORMAL HIGH (ref 0.3–1.2)
Total Protein: 5.1 g/dL — ABNORMAL LOW (ref 6.5–8.1)

## 2017-09-04 LAB — BPAM FFP
BLOOD PRODUCT EXPIRATION DATE: 201906102359
Blood Product Expiration Date: 201906102359
ISSUE DATE / TIME: 201906052048
ISSUE DATE / TIME: 201906051805
UNIT TYPE AND RH: 5100
Unit Type and Rh: 5100

## 2017-09-04 LAB — SURGICAL PCR SCREEN
MRSA, PCR: NEGATIVE
Staphylococcus aureus: NEGATIVE

## 2017-09-04 LAB — LACTATE DEHYDROGENASE: LDH: 149 U/L (ref 98–192)

## 2017-09-04 LAB — LACTIC ACID, PLASMA
LACTIC ACID, VENOUS: 1.8 mmol/L (ref 0.5–1.9)
Lactic Acid, Venous: 1.9 mmol/L (ref 0.5–1.9)

## 2017-09-04 LAB — SAVE SMEAR

## 2017-09-04 LAB — PHOSPHORUS: PHOSPHORUS: 3.3 mg/dL (ref 2.5–4.6)

## 2017-09-04 LAB — APTT: aPTT: 37 seconds — ABNORMAL HIGH (ref 24–36)

## 2017-09-04 LAB — ABO/RH: ABO/RH(D): O POS

## 2017-09-04 LAB — PREPARE RBC (CROSSMATCH)

## 2017-09-04 SURGERY — ENDARTERECTOMY, CAROTID
Anesthesia: General | Site: Neck | Laterality: Right

## 2017-09-04 MED ORDER — ONDANSETRON HCL 4 MG/2ML IJ SOLN
INTRAMUSCULAR | Status: DC | PRN
  Administered 2017-09-04: 4 mg via INTRAVENOUS

## 2017-09-04 MED ORDER — MAGNESIUM SULFATE 2 GM/50ML IV SOLN
2.0000 g | Freq: Once | INTRAVENOUS | Status: AC
  Administered 2017-09-04: 2 g via INTRAVENOUS
  Filled 2017-09-04: qty 50

## 2017-09-04 MED ORDER — ROCURONIUM BROMIDE 100 MG/10ML IV SOLN
INTRAVENOUS | Status: DC | PRN
  Administered 2017-09-04: 40 mg via INTRAVENOUS
  Administered 2017-09-04: 10 mg via INTRAVENOUS

## 2017-09-04 MED ORDER — FENTANYL CITRATE (PF) 100 MCG/2ML IJ SOLN
INTRAMUSCULAR | Status: DC | PRN
  Administered 2017-09-04: 25 ug via INTRAVENOUS
  Administered 2017-09-04: 50 ug via INTRAVENOUS

## 2017-09-04 MED ORDER — HEPARIN SODIUM (PORCINE) 1000 UNIT/ML IJ SOLN
INTRAMUSCULAR | Status: AC
  Filled 2017-09-04: qty 1

## 2017-09-04 MED ORDER — 0.9 % SODIUM CHLORIDE (POUR BTL) OPTIME
TOPICAL | Status: DC | PRN
  Administered 2017-09-04: 1000 mL

## 2017-09-04 MED ORDER — FENTANYL CITRATE (PF) 250 MCG/5ML IJ SOLN
INTRAMUSCULAR | Status: AC
  Filled 2017-09-04: qty 5

## 2017-09-04 MED ORDER — MIDAZOLAM HCL 2 MG/2ML IJ SOLN
INTRAMUSCULAR | Status: AC
  Filled 2017-09-04: qty 2

## 2017-09-04 MED ORDER — PHENYLEPHRINE 40 MCG/ML (10ML) SYRINGE FOR IV PUSH (FOR BLOOD PRESSURE SUPPORT)
PREFILLED_SYRINGE | INTRAVENOUS | Status: DC | PRN
  Administered 2017-09-04 (×2): 120 ug via INTRAVENOUS

## 2017-09-04 MED ORDER — ALBUMIN HUMAN 5 % IV SOLN
25.0000 g | Freq: Once | INTRAVENOUS | Status: AC
  Administered 2017-09-04: 25 g via INTRAVENOUS
  Filled 2017-09-04: qty 250

## 2017-09-04 MED ORDER — ORAL CARE MOUTH RINSE
15.0000 mL | Freq: Two times a day (BID) | OROMUCOSAL | Status: DC
  Administered 2017-09-05 – 2017-09-08 (×4): 15 mL via OROMUCOSAL

## 2017-09-04 MED ORDER — CHLORHEXIDINE GLUCONATE 0.12 % MT SOLN
15.0000 mL | Freq: Two times a day (BID) | OROMUCOSAL | Status: DC
  Administered 2017-09-04 – 2017-09-08 (×9): 15 mL via OROMUCOSAL
  Filled 2017-09-04 (×7): qty 15

## 2017-09-04 MED ORDER — SODIUM CHLORIDE 0.9 % IV SOLN
INTRAVENOUS | Status: AC
  Filled 2017-09-04: qty 1.2

## 2017-09-04 MED ORDER — HEPARIN SODIUM (PORCINE) 5000 UNIT/ML IJ SOLN
INTRAMUSCULAR | Status: DC | PRN
  Administered 2017-09-04: 13:00:00

## 2017-09-04 MED ORDER — ETOMIDATE 2 MG/ML IV SOLN
INTRAVENOUS | Status: DC | PRN
  Administered 2017-09-04: 8 mg via INTRAVENOUS

## 2017-09-04 MED ORDER — PROPOFOL 10 MG/ML IV BOLUS
INTRAVENOUS | Status: AC
  Filled 2017-09-04: qty 20

## 2017-09-04 MED ORDER — PROTAMINE SULFATE 10 MG/ML IV SOLN
INTRAVENOUS | Status: DC | PRN
  Administered 2017-09-04 (×4): 10 mg via INTRAVENOUS

## 2017-09-04 MED ORDER — SUGAMMADEX SODIUM 200 MG/2ML IV SOLN
INTRAVENOUS | Status: AC
  Filled 2017-09-04: qty 2

## 2017-09-04 MED ORDER — HEMOSTATIC AGENTS (NO CHARGE) OPTIME
TOPICAL | Status: DC | PRN
  Administered 2017-09-04: 1 via TOPICAL

## 2017-09-04 MED ORDER — LACTATED RINGERS IV SOLN
INTRAVENOUS | Status: DC | PRN
  Administered 2017-09-04: 12:00:00 via INTRAVENOUS

## 2017-09-04 MED ORDER — SUGAMMADEX SODIUM 200 MG/2ML IV SOLN
INTRAVENOUS | Status: DC | PRN
  Administered 2017-09-04: 200 mg via INTRAVENOUS

## 2017-09-04 MED ORDER — ALBUMIN HUMAN 5 % IV SOLN
INTRAVENOUS | Status: AC
  Filled 2017-09-04: qty 250

## 2017-09-04 MED ORDER — ONDANSETRON HCL 4 MG/2ML IJ SOLN
INTRAMUSCULAR | Status: AC
  Filled 2017-09-04: qty 2

## 2017-09-04 MED ORDER — SUCCINYLCHOLINE CHLORIDE 200 MG/10ML IV SOSY
PREFILLED_SYRINGE | INTRAVENOUS | Status: AC
  Filled 2017-09-04: qty 10

## 2017-09-04 MED ORDER — CEFAZOLIN SODIUM-DEXTROSE 2-3 GM-%(50ML) IV SOLR
INTRAVENOUS | Status: DC | PRN
  Administered 2017-09-04: 2 g via INTRAVENOUS

## 2017-09-04 MED ORDER — CEFAZOLIN SODIUM-DEXTROSE 2-4 GM/100ML-% IV SOLN
INTRAVENOUS | Status: AC
  Filled 2017-09-04: qty 100

## 2017-09-04 MED ORDER — SODIUM CHLORIDE 0.9 % IV SOLN
INTRAVENOUS | Status: DC
  Administered 2017-09-04 – 2017-09-05 (×2): via INTRAVENOUS

## 2017-09-04 MED ORDER — HEPARIN SODIUM (PORCINE) 1000 UNIT/ML IJ SOLN
INTRAMUSCULAR | Status: DC | PRN
  Administered 2017-09-04: 8000 [IU] via INTRAVENOUS

## 2017-09-04 SURGICAL SUPPLY — 51 items
BAG DECANTER FOR FLEXI CONT (MISCELLANEOUS) ×4 IMPLANT
CANISTER SUCT 3000ML PPV (MISCELLANEOUS) ×4 IMPLANT
CLIP VESOCCLUDE MED 24/CT (CLIP) ×4 IMPLANT
CLIP VESOCCLUDE SM WIDE 24/CT (CLIP) ×4 IMPLANT
COVER PROBE W GEL 5X96 (DRAPES) ×4 IMPLANT
CRADLE DONUT ADULT HEAD (MISCELLANEOUS) ×4 IMPLANT
DERMABOND ADVANCED (GAUZE/BANDAGES/DRESSINGS) ×2
DERMABOND ADVANCED .7 DNX12 (GAUZE/BANDAGES/DRESSINGS) ×2 IMPLANT
ELECT REM PT RETURN 9FT ADLT (ELECTROSURGICAL) ×4
ELECTRODE REM PT RTRN 9FT ADLT (ELECTROSURGICAL) ×2 IMPLANT
GAUZE SPONGE 2X2 8PLY STRL LF (GAUZE/BANDAGES/DRESSINGS) ×2 IMPLANT
GLOVE BIO SURGEON STRL SZ7 (GLOVE) ×4 IMPLANT
GLOVE BIO SURGEONS STER SZ 5.5 (GLOVE) ×8 IMPLANT
GLOVE BIOGEL PI IND STRL 6.5 (GLOVE) ×2 IMPLANT
GLOVE BIOGEL PI IND STRL 7.5 (GLOVE) ×2 IMPLANT
GLOVE BIOGEL PI INDICATOR 6.5 (GLOVE) ×2
GLOVE BIOGEL PI INDICATOR 7.5 (GLOVE) ×2
GLOVE SURG SS PI 6.5 STRL IVOR (GLOVE) ×4 IMPLANT
GOWN STRL REUS W/ TWL LRG LVL3 (GOWN DISPOSABLE) ×12 IMPLANT
GOWN STRL REUS W/TWL LRG LVL3 (GOWN DISPOSABLE) ×12
HEMOSTAT SPONGE AVITENE ULTRA (HEMOSTASIS) IMPLANT
IV ADAPTER SYR DOUBLE MALE LL (MISCELLANEOUS) IMPLANT
KIT BASIN OR (CUSTOM PROCEDURE TRAY) ×8 IMPLANT
KIT SHUNT ARGYLE CAROTID ART 6 (VASCULAR PRODUCTS) IMPLANT
KIT TURNOVER KIT B (KITS) ×4 IMPLANT
NEEDLE HYPO 25GX1X1/2 BEV (NEEDLE) IMPLANT
NS IRRIG 1000ML POUR BTL (IV SOLUTION) ×4 IMPLANT
PACK CAROTID (CUSTOM PROCEDURE TRAY) ×4 IMPLANT
PACK SURGICAL SETUP 50X90 (CUSTOM PROCEDURE TRAY) IMPLANT
PAD ARMBOARD 7.5X6 YLW CONV (MISCELLANEOUS) ×8 IMPLANT
SET COLLECT BLD 21X3/4 12 PB (MISCELLANEOUS) IMPLANT
SHUNT CAROTID BYPASS 10 (VASCULAR PRODUCTS) IMPLANT
SHUNT CAROTID BYPASS 12FRX15.5 (VASCULAR PRODUCTS) IMPLANT
SPONGE GAUZE 2X2 STER 10/PKG (GAUZE/BANDAGES/DRESSINGS) ×2
SUT ETHILON 3 0 PS 1 (SUTURE) ×8 IMPLANT
SUT MNCRL AB 4-0 PS2 18 (SUTURE) ×4 IMPLANT
SUT PROLENE 5 0 C 1 24 (SUTURE) ×4 IMPLANT
SUT PROLENE 6 0 BV (SUTURE) ×4 IMPLANT
SUT PROLENE 6 0 CC (SUTURE) ×16 IMPLANT
SUT PROLENE 7 0 BV 1 (SUTURE) IMPLANT
SUT SILK 3 0 (SUTURE)
SUT SILK 3-0 18XBRD TIE 12 (SUTURE) IMPLANT
SUT VIC AB 3-0 SH 27 (SUTURE) ×2
SUT VIC AB 3-0 SH 27X BRD (SUTURE) ×2 IMPLANT
SYR 20CC LL (SYRINGE) ×4 IMPLANT
SYR TB 1ML LUER SLIP (SYRINGE) IMPLANT
SYSTEM CHEST DRAIN TLS 7FR (DRAIN) ×4 IMPLANT
TAPE CLOTH SURG 4X10 WHT LF (GAUZE/BANDAGES/DRESSINGS) ×4 IMPLANT
TOWEL GREEN STERILE (TOWEL DISPOSABLE) ×4 IMPLANT
TUBING ART PRESS 48 MALE/FEM (TUBING) IMPLANT
WATER STERILE IRR 1000ML POUR (IV SOLUTION) ×4 IMPLANT

## 2017-09-04 NOTE — Plan of Care (Signed)
PT SEEN AND POSTOP COURSE REVIEWED. PT ALERT AND INTERACTIVE. PLACED ON LEVOPHED GTT. R IJ IS IN THE AORTA/CAROTID ARTERY. AWAITING POSSIBLE TRANSFER TO [place]. AFEBRILE. SBP > 110. FAMILY AWARE OF PLAN.

## 2017-09-04 NOTE — Plan of Care (Signed)
  Problem: Clinical Measurements: Goal: Respiratory complications will improve Outcome: Progressing Note:  Patient on room air with O2 saturation 94-95%.    Problem: Elimination: Goal: Will not experience complications related to bowel motility Outcome: Progressing   Problem: Clinical Measurements: Goal: Ability to maintain clinical measurements within normal limits will improve Outcome: Progressing

## 2017-09-04 NOTE — Anesthesia Procedure Notes (Signed)
Procedure Name: Intubation Date/Time: 09/04/2017 12:42 PM Performed by: Carney Living, CRNA Pre-anesthesia Checklist: Patient identified, Emergency Drugs available, Suction available, Patient being monitored and Timeout performed Patient Re-evaluated:Patient Re-evaluated prior to induction Oxygen Delivery Method: Circle system utilized Preoxygenation: Pre-oxygenation with 100% oxygen Induction Type: IV induction Ventilation: Mask ventilation without difficulty Laryngoscope Size: Mac and 4 Grade View: Grade II Tube type: Oral Tube size: 7.5 mm Number of attempts: 2 Airway Equipment and Method: Stylet Placement Confirmation: ETT inserted through vocal cords under direct vision,  positive ETCO2 and breath sounds checked- equal and bilateral Secured at: 20 cm Tube secured with: Tape Dental Injury: Teeth and Oropharynx as per pre-operative assessment

## 2017-09-04 NOTE — Anesthesia Preprocedure Evaluation (Addendum)
Anesthesia Evaluation  Patient identified by MRN, date of birth, ID band Patient awake    Reviewed: Allergy & Precautions, H&P , NPO status , Patient's Chart, lab work & pertinent test results  Airway Mallampati: II  TM Distance: >3 FB Neck ROM: Full    Dental no notable dental hx. (+) Teeth Intact, Dental Advisory Given   Pulmonary neg pulmonary ROS,    Pulmonary exam normal breath sounds clear to auscultation       Cardiovascular + Valvular Problems/Murmurs AS  Rhythm:Regular Rate:Normal + Systolic murmurs    Neuro/Psych Anxiety Depression negative neurological ROS     GI/Hepatic PUD, GERD  Medicated and Controlled,(+) Cirrhosis       , Hepatitis -  Endo/Other  Hypothyroidism   Renal/GU negative Renal ROS  negative genitourinary   Musculoskeletal  (+) Arthritis , Osteoarthritis,  Fibromyalgia -  Abdominal   Peds  Hematology negative hematology ROS (+)   Anesthesia Other Findings   Reproductive/Obstetrics negative OB ROS                           Anesthesia Physical Anesthesia Plan  ASA: III and emergent  Anesthesia Plan: General   Post-op Pain Management:    Induction: Intravenous  PONV Risk Score and Plan: 4 or greater and Ondansetron, Dexamethasone and Treatment may vary due to age or medical condition  Airway Management Planned: Oral ETT  Additional Equipment: Arterial line  Intra-op Plan:   Post-operative Plan: Extubation in OR  Informed Consent: I have reviewed the patients History and Physical, chart, labs and discussed the procedure including the risks, benefits and alternatives for the proposed anesthesia with the patient or authorized representative who has indicated his/her understanding and acceptance.   Dental advisory given  Plan Discussed with: CRNA, Anesthesiologist and Surgeon  Anesthesia Plan Comments:       Anesthesia Quick Evaluation

## 2017-09-04 NOTE — Progress Notes (Signed)
1 Day Post-Op  Subjective: Patient is awake and alert.  Mild incisional pain.  Objective: Vital signs in last 24 hours: Temp:  [97.9 F (36.6 C)-99.1 F (37.3 C)] 98.8 F (37.1 C) (06/06 0400) Pulse Rate:  [37-116] 88 (06/06 0800) Resp:  [11-28] 19 (06/06 0800) BP: (74-123)/(26-81) 119/46 (06/06 0800) SpO2:  [94 %-100 %] 98 % (06/06 0800) Weight:  [188 lb 7.9 oz (85.5 kg)] 188 lb 7.9 oz (85.5 kg) (06/06 0500) Last BM Date: 09/03/17  Intake/Output from previous day: 06/05 0701 - 06/06 0700 In: 3088.5 [I.V.:2608.5; Blood:430; IV Piggyback:50] Out: 5885 [Urine:1150; Drains:2075; Blood:100] Intake/Output this shift: No intake/output data recorded.  General appearance: alert, cooperative and no distress Resp: clear to auscultation bilaterally Cardio: Regular rate and rhythm.  2 out of 6 systolic ejection murmur noted. GI: Soft, incision healing well.  JP drainage is more serosanguineous in nature instead of frankly bloody.  Lab Results:  Recent Labs    09/03/17 2219 09/04/17 0433  WBC 10.8* 12.7*  HGB 8.1* 8.3*  HCT 24.2* 24.5*  PLT 129* 127*   BMET Recent Labs    09/03/17 2219 09/04/17 0433  NA 136 135  K 3.6 3.8  CL 103 103  CO2 24 25  GLUCOSE 138* 160*  BUN 20 21*  CREATININE 1.40* 1.28*  CALCIUM 8.5* 8.4*   PT/INR Recent Labs    09/03/17 2219 09/04/17 0433  LABPROT 19.4* 19.1*  INR 1.65 1.62    Studies/Results: Ct Chest W Contrast  Result Date: 09/03/2017 CLINICAL DATA:  Central line placement. EXAM: CT CHEST WITH CONTRAST TECHNIQUE: Multidetector CT imaging of the chest was performed during intravenous contrast administration. CONTRAST:  75 mL ISOVUE-300 IOPAMIDOL (ISOVUE-300) INJECTION 61% COMPARISON:  Chest radiograph 09/03/2017 FINDINGS: Cardiovascular: Right-sided vascular catheter traverses the right internal jugular vein and enters the right common carotid artery, with tip in the proximal aortic arch. There is atherosclerotic calcification in the  aorta. Mild cardiomegaly. There is soft tissue edema in the right neck, extending into the upper right chest wall and axilla. Mediastinum/Nodes: No mediastinal, hilar or axillary lymphadenopathy. The visualized thyroid and thoracic esophageal course are unremarkable. Lungs/Pleura: Lungs are clear. No pleural effusion or pneumothorax. Upper Abdomen: There is a large amount of free air in the anterior abdomen. The liver is diffusely nodular. Small volume ascites. Musculoskeletal: No chest wall abnormality. No acute or significant osseous findings. IMPRESSION: 1. Right-sided vascular access catheter is intra arterial with tip in the aortic arch. The catheter traverses the right internal jugular vein and enters the right common carotid artery before continuing inferiorly to the aortic arch. There is surrounding soft tissue edema that extends into the right upper chest wall and axilla. 2. Large volume pneumoperitoneum, consistent with recent perforation and subsequent abdominal surgery. 3.  Aortic Atherosclerosis (ICD10-I70.0). Critical Value/emergent results were called by telephone at the time of interpretation on 09/03/2017 at 10:35 pm to Dr. Aviva Signs , who verbally acknowledged these results. Electronically Signed   By: Ulyses Jarred M.D.   On: 09/03/2017 22:35   Dg Chest Port 1 View  Result Date: 09/03/2017 CLINICAL DATA:  Central line placement. EXAM: PORTABLE CHEST 1 VIEW COMPARISON:  Radiograph of September 01, 2017. FINDINGS: The heart size and mediastinal contours are within normal limits. Both lungs are clear. Hypoinflation of the lungs is noted. No pneumothorax or pleural effusion is noted. Interval placement of right-sided neck catheter. The tip is positioned more medially than expected and is concerning for possible arterial puncture. The  visualized skeletal structures are unremarkable. IMPRESSION: Hypoinflation of the lungs. Interval placement of right-sided neck catheter. The distal tip is significantly  more medial than would be expected for superior vena cava and internal jugular venous access. The possibility of inadvertent arterial catheterization with tip in aorta cannot be excluded. Clinical correlation is recommended as well as further evaluation with CT scan. These results will be called to the ordering clinician or representative by the Radiologist Assistant, and communication documented in the PACS or zVision Dashboard. Electronically Signed   By: Marijo Conception, M.D.   On: 09/03/2017 18:03    Anti-infectives: Anti-infectives (From admission, onward)   Start     Dose/Rate Route Frequency Ordered Stop   09/04/17 1200  ertapenem (INVANZ) 1 g in sodium chloride 0.9 % 50 mL IVPB     1 g 100 mL/hr over 30 Minutes Intravenous Every 24 hours 09/03/17 1720     09/03/17 2200  piperacillin-tazobactam (ZOSYN) IVPB 3.375 g  Status:  Discontinued     3.375 g 12.5 mL/hr over 240 Minutes Intravenous Every 8 hours 09/03/17 1801 09/03/17 1815   09/03/17 1245  ertapenem (INVANZ) 1 g in sodium chloride 0.9 % 100 mL IVPB     1 g 200 mL/hr over 30 Minutes Intravenous  Once 09/03/17 1239 09/03/17 1310      Assessment/Plan: s/p Procedure(s): EXPLORATORY LAPAROTOMY PARTIAL COLECTOMY Impression: Patient continues to be on levo fed, though she is stable.  She has done remarkably well overnight.  She does have mild hypomagnesemia.  Her hematocrit is staying stable over the last 2 blood draws.  Her platelet count is staying above 100,000.  Her INR is reasonable. Is stable for transfer to critical care at [facility] for vascular surgery consultation and removal of the right IJ line in the carotid artery.  Once they have been cleared by vascular surgery, patient can be transferred back to [facility] for ongoing management and treatment.  LOS: 3 days    Aviva Signs 09/04/2017

## 2017-09-04 NOTE — Transfer of Care (Signed)
Immediate Anesthesia Transfer of Care Note  Patient: Melanie Porter  Procedure(s) Performed: Removal of Central line from Right Carotid artery, Repair of Right Common Carotid & Right internal jugular. (Right Neck)  Patient Location: PACU  Anesthesia Type:General  Level of Consciousness: awake, alert , oriented and patient cooperative  Airway & Oxygen Therapy: Patient Spontanous Breathing and Patient connected to nasal cannula oxygen  Post-op Assessment: Report given to RN, Post -op Vital signs reviewed and stable, Patient moving all extremities X 4 and Patient able to stick tongue midline  Post vital signs: Reviewed and stable  Last Vitals:  Vitals Value Taken Time  BP 122/45 09/04/2017  2:12 PM  Temp    Pulse 90 09/04/2017  2:21 PM  Resp 16 09/04/2017  2:21 PM  SpO2 99 % 09/04/2017  2:21 PM  Vitals shown include unvalidated device data.  Last Pain:  Vitals:   09/04/17 1125  TempSrc:   PainSc: 0-No pain      Patients Stated Pain Goal: 0 (38/32/91 9166)  Complications: No apparent anesthesia complications

## 2017-09-04 NOTE — Plan of Care (Signed)
  Problem: Clinical Measurements: Goal: Respiratory complications will improve Outcome: Progressing   Problem: Pain Managment: Goal: General experience of comfort will improve Outcome: Progressing   Problem: Safety: Goal: Ability to remain free from injury will improve Outcome: Progressing   Problem: Clinical Measurements: Goal: Postoperative complications will be avoided or minimized Outcome: Progressing

## 2017-09-04 NOTE — Anesthesia Postprocedure Evaluation (Addendum)
Anesthesia Post Note  Patient: Analilia S Brechtel  Procedure(s) Performed: EXPLORATORY LAPAROTOMY (N/A ) PARTIAL COLECTOMY  Patient location during evaluation: PACU Anesthesia Type: General Level of consciousness: awake and alert and oriented Pain management: pain level controlled Vital Signs Assessment: vitals unstable Respiratory status: spontaneous breathing Cardiovascular status: stable Postop Assessment: no apparent nausea or vomiting Anesthetic complications: no Comments: Patient comfortable, hypotension in PACU,  To receive platelets and FFP, dopamine started and Dr. Arnoldo Morale and Dr. Rick Duff present in PACU . Preparing to move patient to  the ICU at 1730  Late Entry, 09/04/2017, 1048     Last Vitals:  Vitals:   09/04/17 0615 09/04/17 0630  BP: (!) 114/52 (!) 120/41  Pulse: 99 90  Resp: (!) 22 19  Temp:    SpO2: 98% 98%    Last Pain:  Vitals:   09/04/17 0400  TempSrc: Oral  PainSc: Asleep                 Ebrima Ranta

## 2017-09-04 NOTE — Progress Notes (Signed)
PROGRESS NOTE  Melanie Porter RKY:[id number] DOB: [birth year] DOA: 09/01/2017 PCP: Sharilyn Sites, MD  Brief History:  77 year old female with a history of hyperlipidemia,NASH Cirrhosis, splenomegaly, hypothyroidism presenting with worsening lower extremity edema, increasing abdominal girth, and nausea and fatigue. She was seen in Dr. Oneida Alar' office on 07/23/17 and sent to ED due to anasarca. The patient was recently admitted to the hospital from 06/29/2017 through 07/29/2017 for decompensated liver cirrhosis. She had a paracentesis on 06/30/2017 removing 2.8 L and she was diuresed with symptomatic improvement.   She had another subsequent hospitalizationfrom 07/23/2017 through 07/29/2017. During that hospitalization, the patient had another paracentesis on 07/24/2017 removing 2.5 L.  She was -18 L for this admission.  Her discharge weight was 210 pounds.  Her maintenance dose of furosemide was increased to 80 mg twice daily and Spironolactone was increased to 50 mg twice daily during the late April admission.   Unfortunately, the patient was readmitted on 09/01/2017 again for anasarca and decompensated cirrhosis.  Because of hematochezia, the patient underwent colonoscopy 09/03/2017.  Her colonoscopy was complicated by colonic perforation.  She was taken to the OR for partial colectomy by Dr. Aviva Signs.  Her hospital course has been complicated by hypotension likely from fluid shifts and blood loss.  The patient was started on Levophed for vasopressor support.  In addition, right IJ triple lumen catheter was placed, but CT of the chest revealed that the catheter traverses the right internal jugular vein and enters the right common carotid artery, with tip in the proximal aortic arch.  Dr. Trula Slade was consulted and recommended transfer to [facility] for repair.   Assessment/Plan: Decompensated liver cirrhosis with anasarca -Holding Lasix and aldactone due to soft  BP/hypotension -6/3/19paracentesis-->5L removed, albumin given -06/30/2017 echo--EF 65-70%, no WMA, grade 1 DD -doubt decompensated CHF as EF is perserved -Check urine protein creatinine ratio--0.21 -discharge weight 210 lbs on 07/29/17 -Lasix being held due to hypotension  Perforated colon-iatrogenic -05/30/49 complication of colonoscopy--aborted -09/03/17 partial colectomy--Dr. Aviva Signs -continue ertapenem -09/04/17--case discussed with Dr. Mirian Mo is happy to accept pt back to his service once carotid is repaired  Malpositioned right IJ triple lumen -09/03/2017 CT chest-- revealed that the catheter traverses the right internal jugular vein and enters the right common carotid artery, with tip in the proximal aortic arch.  -Dr. Arnoldo Morale spoke with Dr. Di Kindle to [facility] for repair  Post Op Bleeding -suspect low grade DIC -discussed with Dr. Mauro Kaufmann units FFP given 09/03/17 -2 units platelets given on 09/03/17 -am CBC  Hypotension -due to fluid shifts, blood loss anemia -levophed at 8-10 mcg/min -wean as tolerated  Hematochezia -baseline Hgb ~10-11 -appreciate GI -no further hematochezia  Thrombocytopenia -Secondary to liver cirrhosis -Monitor for signs of bleeding -overall stable -received 2 units platelets 09/03/17  Hypothyroidism -continue Synthroid  Depression/anxiety -Continue fluoxetine when able to tolerate po  Hyperlipidemia -Continue statin  Hypokalemia -repleted  Hypomagnesemia -repleted  Goals of Care -discussed with son -palliative consulted--family meeting pending -changed to DNR    Disposition Plan:   Transfer to [facility]--discussed with Dr. Lamonte Sakai   Family Communication:   Daughter updated at bedside 6/6-- The patient is critically ill with multiple organ systems failure and requires high complexity decision making for assessment and support, frequent evaluation and titration of therapies, application of  advanced monitoring technologies and extensive interpretation of multiple databases.  Critical care time - 35 mins.    Consultants:  GI,  general surgery  Code Status: DNR  DVT Prophylaxis:  SCDs   Procedures: As Listed in Progress Note Above  Antibiotics: None     Subjective: Patient is awake and alert.  She denies any chest pain, shortness breath, vomiting, diarrhea.  She complains of abdominal pain at the surgical site.  Objective: Vitals:   09/04/17 0545 09/04/17 0600 09/04/17 0615 09/04/17 0630  BP: (!) 120/43 (!) 120/44 (!) 114/52 (!) 120/41  Pulse: 86 87 99 90  Resp: 20 (!) 22 (!) 22 19  Temp:      TempSrc:      SpO2: 98% 100% 98% 98%  Weight:      Height:        Intake/Output Summary (Last 24 hours) at 09/04/2017 0752 Last data filed at 09/04/2017 1751 Gross per 24 hour  Intake 3088.54 ml  Output 6625 ml  Net -3536.46 ml   Weight change: -7.8 kg (-17 lb 3.1 oz) Exam:   General:  Pt is alert, follows commands appropriately, not in acute distress  HEENT: No icterus, No thrush, No neck mass, [place]/AT  Cardiovascular: RRR, S1/S2, no rubs, no gallops  Respiratory: Diminished breath sounds at bases.  No wheezing.  Good air movement.  Abdomen: Soft/+BS, diffusely tender, non distended, no guarding  Extremities: 2 + LE edema, No lymphangitis, No petechiae, No rashes, no synovitis   Data Reviewed: I have personally reviewed following labs and imaging studies Basic Metabolic Panel: Recent Labs  Lab 09/01/17 1120 09/01/17 1141 09/02/17 1022 09/03/17 0438 09/03/17 2219 09/04/17 0433  NA  --  133* 135 133* 136 135  K  --  3.2* 3.4* 3.1* 3.6 3.8  CL  --  99* 101 102 103 103  CO2  --  27 25 27 24 25   GLUCOSE  --  93 112* 118* 138* 160*  BUN  --  21* 20 18 20  21*  CREATININE  --  1.11* 0.99 1.12* 1.40* 1.28*  CALCIUM  --  8.8* 8.5* 8.3* 8.5* 8.4*  MG 1.6*  --   --   --   --  1.3*  PHOS 3.1  --   --   --   --  3.3   Liver Function Tests: Recent  Labs  Lab 09/01/17 1141 09/02/17 1022 09/03/17 0438 09/03/17 2219 09/04/17 0433  AST 52* 43* 41 37 33  ALT 29 22 22 23 21   ALKPHOS 141* 101 105 84 81  BILITOT 2.3* 2.0* 1.8* 1.7* 1.6*  PROT 6.0* 5.7* 5.0* 5.3* 5.1*  ALBUMIN 2.4* 3.0* 2.6* 2.7* 2.7*   No results for input(s): LIPASE, AMYLASE in the last 168 hours. No results for input(s): AMMONIA in the last 168 hours. Coagulation Profile: Recent Labs  Lab 09/01/17 1141 09/02/17 1058 09/03/17 0438 09/03/17 2219 09/04/17 0433  INR 1.70 1.73 1.80 1.65 1.62   CBC: Recent Labs  Lab 09/01/17 1141 09/02/17 1022 09/03/17 1649 09/03/17 2219 09/04/17 0433  WBC 8.5 4.7  --  10.8* 12.7*  NEUTROABS 6.6 3.3  --   --   --   HGB 11.9* 10.8* 9.4* 8.1* 8.3*  HCT 35.4* 33.0* 27.2* 24.2* 24.5*  MCV 103.2* 105.8*  --  104.8* 104.3*  PLT 70* 50*  --  129* 127*   Cardiac Enzymes: No results for input(s): CKTOTAL, CKMB, CKMBINDEX, TROPONINI in the last 168 hours. BNP: Invalid input(s): POCBNP CBG: No results for input(s): GLUCAP in the last 168 hours. HbA1C: No results for input(s): HGBA1C in the last 72 hours. Urine  analysis:    Component Value Date/Time   COLORURINE YELLOW 07/23/2017 [place] 07/23/2017 1509   LABSPEC 1.016 07/23/2017 1509   PHURINE 6.0 07/23/2017 1509   GLUCOSEU NEGATIVE 07/23/2017 1509   HGBUR NEGATIVE 07/23/2017 1509   BILIRUBINUR NEGATIVE 07/23/2017 1509   KETONESUR NEGATIVE 07/23/2017 1509   PROTEINUR NEGATIVE 07/23/2017 1509   UROBILINOGEN 0.2 07/27/2014 1220   NITRITE NEGATIVE 07/23/2017 1509   LEUKOCYTESUR NEGATIVE 07/23/2017 1509   Sepsis Labs: @LABRCNTIP (procalcitonin:4,lacticidven:4) ) Recent Results (from the past 240 hour(s))  MRSA PCR Screening     Status: None   Collection Time: 09/03/17  5:18 PM  Result Value Ref Range Status   MRSA by PCR NEGATIVE NEGATIVE Final    Comment:        The GeneXpert MRSA Assay (FDA approved for NASAL specimens only), is one component  of a comprehensive MRSA colonization surveillance program. It is not intended to diagnose MRSA infection nor to guide or monitor treatment for MRSA infections. Performed at [facility], [address] [place], [place] [address]      Scheduled Meds: . chlorhexidine  15 mL Mouth Rinse BID  . Chlorhexidine Gluconate Cloth  6 each Topical Once  . Chlorhexidine Gluconate Cloth  6 each Topical Daily  . levothyroxine  100 mcg Oral QAC breakfast  . levothyroxine  75 mcg Oral QAC breakfast  . mouth rinse  15 mL Mouth Rinse q12n4p  . nystatin cream   Topical BID  . sodium chloride flush  10-40 mL Intracatheter Q12H  . sodium chloride flush  3 mL Intravenous Q12H  . sodium chloride flush  3 mL Intravenous Q12H   Continuous Infusions: . sodium chloride 250 mL (09/02/17 0301)  . sodium chloride 1,000 mL (09/03/17 1150)  . ertapenem [facility]) IV    . famotidine (PEPCID) IV Stopped (09/04/17 0110)  . lactated ringers 50 mL/hr at 09/04/17 0037  . norepinephrine (LEVOPHED) Adult infusion 10 mcg/min (09/04/17 5093)    Procedures/Studies: Dg Chest 2 View  Result Date: 09/01/2017 CLINICAL DATA:  Coughing and shortness of breath over the last 2 weeks. EXAM: CHEST - 2 VIEW COMPARISON:  07/23/2017.  06/29/2017. FINDINGS: Poor inspiration or low lung volumes. Heart size is normal. Chronic aortic atherosclerosis. Bronchial thickening pattern consistent with bronchitis, but no consolidation or lobar collapse. No effusions. Chronic degenerative changes affect the spine. Old thoracolumbar compression fractures. IMPRESSION: Possible bronchitis. No consolidation or collapse. Poor inspiration versus low lung volumes. Electronically Signed   By: Nelson Chimes M.D.   On: 09/01/2017 12:27   Ct Chest W Contrast  Result Date: 09/03/2017 CLINICAL DATA:  Central line placement. EXAM: CT CHEST WITH CONTRAST TECHNIQUE: Multidetector CT imaging of the chest was performed during intravenous contrast administration.  CONTRAST:  75 mL ISOVUE-300 IOPAMIDOL (ISOVUE-300) INJECTION 61% COMPARISON:  Chest radiograph 09/03/2017 FINDINGS: Cardiovascular: Right-sided vascular catheter traverses the right internal jugular vein and enters the right common carotid artery, with tip in the proximal aortic arch. There is atherosclerotic calcification in the aorta. Mild cardiomegaly. There is soft tissue edema in the right neck, extending into the upper right chest wall and axilla. Mediastinum/Nodes: No mediastinal, hilar or axillary lymphadenopathy. The visualized thyroid and thoracic esophageal course are unremarkable. Lungs/Pleura: Lungs are clear. No pleural effusion or pneumothorax. Upper Abdomen: There is a large amount of free air in the anterior abdomen. The liver is diffusely nodular. Small volume ascites. Musculoskeletal: No chest wall abnormality. No acute or significant osseous findings. IMPRESSION: 1. Right-sided  vascular access catheter is intra arterial with tip in the aortic arch. The catheter traverses the right internal jugular vein and enters the right common carotid artery before continuing inferiorly to the aortic arch. There is surrounding soft tissue edema that extends into the right upper chest wall and axilla. 2. Large volume pneumoperitoneum, consistent with recent perforation and subsequent abdominal surgery. 3.  Aortic Atherosclerosis (ICD10-I70.0). Critical Value/emergent results were called by telephone at the time of interpretation on 09/03/2017 at 10:35 pm to Dr. Aviva Signs , who verbally acknowledged these results. Electronically Signed   By: Ulyses Jarred M.D.   On: 09/03/2017 22:35   US Paracentesis  Result Date: 09/01/2017 INDICATION: Recurrent ascites, cirrhosis due to NASH EXAM: ULTRASOUND GUIDED THERAPEUTIC PARACENTESIS MEDICATIONS: None. COMPLICATIONS: None immediate. PROCEDURE: Procedure, benefits, and risks of procedure were discussed with patient. Written informed consent for procedure was obtained.  Time out protocol followed. Adequate collection of ascites localized by ultrasound in RIGHT lower quadrant. Skin prepped and draped in usual sterile fashion. Skin and soft tissues anesthetized with 10 mL of 1% lidocaine. 5 [place] Yueh catheter placed into peritoneal cavity. 5 L of clear yellow ascitic fluid aspirated by vacuum bottle suction. Procedure tolerated well by patient without immediate complication. FINDINGS: As above IMPRESSION: Successful ultrasound-guided paracentesis yielding 5 liters of peritoneal fluid. Electronically Signed   By: Lavonia Dana M.D.   On: 09/01/2017 15:58   Dg Chest Port 1 View  Result Date: 09/03/2017 CLINICAL DATA:  Central line placement. EXAM: PORTABLE CHEST 1 VIEW COMPARISON:  Radiograph of September 01, 2017. FINDINGS: The heart size and mediastinal contours are within normal limits. Both lungs are clear. Hypoinflation of the lungs is noted. No pneumothorax or pleural effusion is noted. Interval placement of right-sided neck catheter. The tip is positioned more medially than expected and is concerning for possible arterial puncture. The visualized skeletal structures are unremarkable. IMPRESSION: Hypoinflation of the lungs. Interval placement of right-sided neck catheter. The distal tip is significantly more medial than would be expected for superior vena cava and internal jugular venous access. The possibility of inadvertent arterial catheterization with tip in aorta cannot be excluded. Clinical correlation is recommended as well as further evaluation with CT scan. These results will be called to the ordering clinician or representative by the Radiologist Assistant, and communication documented in the PACS or zVision Dashboard. Electronically Signed   By: Marijo Conception, M.D.   On: 09/03/2017 18:03    Orson Eva, DO  [facility] Pager [phone]  If 7PM-7AM, please contact night-coverage [email or web address] Password [email or web address] 09/04/2017, 7:52 AM   LOS: 3 days

## 2017-09-04 NOTE — Consult Note (Signed)
Requested by:  Dr. Aviva Signs (General Surgery)  Reason for consultation: inadvertent right carotid artery cannulation and central venous catheter placement    History of Present Illness   Melanie Porter is a 77 y.o. ([birth year]) female with known cirrhosis and coagulopathy who presents with cc: accidental placement of right internal jugular vein central venous catheter.  The patient had a colon perforation yesterday that required colectomy.  Central venous catheter was placed preop to facilitate intraoperative access.  Postop x-ray suggested abnormal position.  Further work-up confirmed a right common carotid artery cannulation.  Patient was transferred to [facility] for removal of the central line.  Past Medical History:  Diagnosis Date  . Ankle fracture, right 05/21/10  . Anxiety   . Arthritis   . Back fracture 05/21/10  . Depression   . Fibromyalgia   . GERD (gastroesophageal reflux disease)   . Hypercholesteremia   . 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 Knee fracture, right 05/21/10  . Nonalcoholic steatohepatitis (NASH) BMI > 40   NOV 2013 CHILD PUGH A, MELD SCORE 7. NOV 2017 CHILD PUGH B, MELD 9  . Obesity   . Splenomegaly 09/23/2012  . Thrombocytopenia ([place]) 02/19/2012    Past Surgical History:  Procedure Laterality Date  . ABDOMINAL HYSTERECTOMY    . BREAST BIOPSY  1982   left benign  . CATARACT EXTRACTION W/PHACO Right 07/11/2015   Procedure: CATARACT EXTRACTION PHACO AND INTRAOCULAR LENS PLACEMENT (IOC);  Surgeon: Rutherford Guys, MD;  Location: [facility];  Service: Ophthalmology;  Laterality: Right;  CDE: 6.00  . CATARACT EXTRACTION W/PHACO Left 07/25/2015   Procedure: CATARACT EXTRACTION PHACO AND INTRAOCULAR LENS PLACEMENT (IOC);  Surgeon: Rutherford Guys, MD;  Location: [facility];  Service: Ophthalmology;  Laterality: Left;  CDE:8.58  . COLONOSCOPY  June 2010   Dr. Collene Mares: few scattered sigmoid diverticula, otherwise normal. Screening in 2020   .  ESOPHAGOGASTRODUODENOSCOPY  03/02/2012   Dr. Oneida Alar:The mucosa of the esophagus appeared normal Multiple non-bleeding ulcers, ranging between 3-18mm in size were found in the gastric antrum  The duodenal mucosa showed no abnormalities in the bulb and second portion of the duodenum. path negative for H.pylori  . ESOPHAGOGASTRODUODENOSCOPY N/A 07/15/2014   SLF: 1. No esophageal, gastric, or duodenal varices 2. small 1-2 mm gastric ulcer. Negative H.pylori   . ESOPHAGOGASTRODUODENOSCOPY N/A 06/05/2015   SLF: 1. Mild gastritis on ASA and PPI  . PARTIAL HYSTERECTOMY       Social History   Socioeconomic History  . Marital status: Widowed    Spouse name: Not on file  . Number of children: Not on file  . Years of education: Not on file  . Highest education level: Not on file  Occupational History  . 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 Financial resource strain: Not on file  . Food insecurity:    Worry: Not on file    Inability: Not on file  . Transportation needs:    Medical: Not on file    Non-medical: Not on file  Tobacco Use  . Smoking status: Never Smoker  . Smokeless tobacco: Never Used  . Tobacco comment: Never smoked  Substance and Sexual Activity  . Alcohol use: No    Alcohol/week: 0.0 oz  . Drug use: No  . Sexual activity: Never  Lifestyle  . Physical activity:    Days per week: Not on file    Minutes per  session: Not on file  . Stress: Not on file  Relationships  . Social connections:    Talks on phone: Not on file    Gets together: Not on file    Attends religious service: Not on file    Active member of club or organization: Not on file    Attends meetings of clubs or organizations: Not on file    Relationship status: Not on file  . Intimate partner violence:    Fear of current or ex partner: Not on file    Emotionally abused: Not on file    Physically abused: Not on file    Forced sexual activity: Not on file    Other Topics Concern  . Not on file  Social History Narrative   HAS A HUSBAND WHO MAY HAVE DEMENTIA AND IS CHALLENGING TO CARE FOR.    Family History  Problem Relation Age of Onset  . Heart disease Father   . Heart attack Father   . Aneurysm Mother   . Diabetes Sister   . Hypertension Sister   . Heart disease Sister   . Hypertension Brother   . Heart disease Brother   . Colon cancer Neg Hx     Current Facility-Administered Medications  Medication Dose Route Frequency Provider Last Rate Last Dose  . [MAR Hold] 0.9 %  sodium chloride infusion  250 mL Intravenous PRN Tat, Shanon Brow, MD 10 mL/hr at 09/02/17 0301 250 mL at 09/02/17 0301  . 0.9 %  sodium chloride infusion   Intravenous Continuous Tat, David, MD 10 mL/hr at 09/03/17 1150 1,000 mL at 09/03/17 1150  . [MAR Hold] acetaminophen (TYLENOL) tablet 650 mg  650 mg Oral Q6H PRN Tat, David, MD       Or  . Doug Sou Hold] acetaminophen (TYLENOL) suppository 650 mg  650 mg Rectal Q6H PRN Tat, Shanon Brow, MD      . Doug Sou Hold] chlorhexidine (PERIDEX) 0.12 % solution 15 mL  15 mL Mouth Rinse BID Tat, David, MD   15 mL at 09/04/17 0914  . [MAR Hold] Chlorhexidine Gluconate Cloth 2 % PADS 6 each  6 each Topical Once Tat, David, MD      . Doug Sou Hold] Chlorhexidine Gluconate Cloth 2 % PADS 6 each  6 each Topical Daily Tat, David, MD   6 each at 09/04/17 0915  . [MAR Hold] ertapenem (INVANZ) 1 g in sodium chloride 0.9 % 50 mL IVPB  1 g Intravenous Q24H Tat, David, MD      . Doug Sou Hold] famotidine (PEPCID) IVPB 20 mg premix  20 mg Intravenous Therisa Doyne, MD   Stopped at 09/04/17 0940  . [MAR Hold] hydrocortisone-pramoxine (ANALPRAM-HC) 2.5-1 % rectal cream   Rectal Q6H PRN Tat, Shanon Brow, MD      . Doug Sou Hold] HYDROmorphone (DILAUDID) injection 1 mg  1 mg Intravenous Q3H PRN Tat, Shanon Brow, MD      . Doug Sou Hold] levothyroxine (SYNTHROID, LEVOTHROID) tablet 100 mcg  100 mcg Oral QAC breakfast Tat, Shanon Brow, MD   100 mcg at 09/04/17 7673  . [MAR Hold] levothyroxine  (SYNTHROID, LEVOTHROID) tablet 75 mcg  75 mcg Oral QAC breakfast Tat, Shanon Brow, MD   75 mcg at 09/04/17 0605  . [MAR Hold] LORazepam (ATIVAN) injection 0.5 mg  0.5 mg Intravenous Q4H PRN Tat, Shanon Brow, MD      . Doug Sou Hold] MEDLINE mouth rinse  15 mL Mouth Rinse q12n4p Tat, Shanon Brow, MD      . Doug Sou Hold] norepinephrine (LEVOPHED) 4mg   in D5W 275mL premix infusion  0-40 mcg/min Intravenous Titrated Tat, Shanon Brow, MD 22.5 mL/hr at 09/04/17 0930 6 mcg/min at 09/04/17 0930  . [MAR Hold] nystatin cream (MYCOSTATIN)   Topical BID Tat, Shanon Brow, MD      . Doug Sou Hold] ondansetron (ZOFRAN-ODT) disintegrating tablet 4 mg  4 mg Oral Q6H PRN Tat, Shanon Brow, MD       Or  . Doug Sou Hold] ondansetron [facility]) injection 4 mg  4 mg Intravenous Q6H PRN Tat, Shanon Brow, MD      . Doug Sou Hold] polyvinyl alcohol (LIQUIFILM TEARS) 1.4 % ophthalmic solution 1 drop  1 drop Both Eyes Daily PRN Tat, David, MD      . Doug Sou Hold] sodium chloride flush (NS) 0.9 % injection 10-40 mL  10-40 mL Intracatheter Therisa Doyne, MD   10 mL at 09/04/17 0913  . [MAR Hold] sodium chloride flush (NS) 0.9 % injection 10-40 mL  10-40 mL Intracatheter PRN Tat, David, MD      . Doug Sou Hold] sodium chloride flush (NS) 0.9 % injection 3 mL  3 mL Intravenous Therisa Doyne, MD   3 mL at 09/04/17 0914  . [MAR Hold] sodium chloride flush (NS) 0.9 % injection 3 mL  3 mL Intravenous Therisa Doyne, MD   3 mL at 09/04/17 0913  . [MAR Hold] sodium chloride flush (NS) 0.9 % injection 3 mL  3 mL Intravenous PRN Tat, David, MD        No Known Allergies  REVIEW OF SYSTEMS (negative unless checked):   Cardiac:  []  Chest pain or chest pressure? []  Shortness of breath upon activity? []  Shortness of breath when lying flat? []  Irregular heart rhythm?  Vascular:  []  Pain in calf, thigh, or hip brought on by walking? []  Pain in feet at night that wakes you up from your sleep? []  Blood clot in your veins? []  Leg swelling?  Pulmonary:  []  Oxygen at home? []  Productive cough? []   Wheezing?  Neurologic:  []  Sudden weakness in arms or legs? []  Sudden numbness in arms or legs? []  Sudden onset of difficult speaking or slurred speech? []  Temporary loss of vision in one eye? []  Problems with dizziness?  Gastrointestinal:  []  Blood in stool? []  Vomited blood?  Genitourinary:  []  Burning when urinating? []  Blood in urine?  Psychiatric:  []  Major depression  Hematologic:  [x]  Bleeding problems? []  Problems with blood clotting?  Dermatologic:  []  Rashes or ulcers?  Constitutional:  []  Fever or chills?  Ear/Nose/Throat:  []  Change in hearing? []  Nose bleeds? []  Sore throat?  Musculoskeletal:  []  Back pain? []  Joint pain? []  Muscle pain?   Physical Examination     Vitals:   09/04/17 0950 09/04/17 0955 09/04/17 1000 09/04/17 1125  BP:  (!) 103/49  (!) 106/50  Pulse: 93 87 88   Resp: 20 20 19  (!) 30  Temp:      TempSrc:      SpO2: 98% 98% 98% 99%  Weight:      Height:       Body mass index is 34.48 kg/m.  General Alert, somewhat confused, intermittently lucid, Ill appearing  Head [place]/AT,    Ear/Nose/ Throat Hearing grossly intact, nares without erythema or drainage, oropharynx without Erythema or Exudate, Mallampati score: 3,   Eyes PERRLA, EOMI,    Neck Supple, mid-line trachea,    Pulmonary Sym exp, good B air movt, rales on BLL  Cardiac RRR, Nl S1, S2, no Murmurs, No  rubs, No S3,S4, on 8 mcg/min NE drip  Vascular Vessel Right Left  Radial Faintly palpable Faintly palpable  Brachial Faintly palpable Faintly palpable  Carotid Central line in place over proximal neck Palpable, No Bruit  Aorta Not palpable N/A  Femoral Palpable Palpable  Popliteal Not palpable Not palpable  PT Not palpable Not palpable  DP Not palpable Not palpable    Gastro- intestinal soft, non-distended, appropriate tenderness to palpation, No guarding or rebound, mid-line incision in place, RLQ drain  Musculo- skeletal Limited exam, moves all extremities  (3/5) but globally weak  Neurologic Cranial nerves grossly intact, Pain and light touch intact in extremities, Motor exam as listed above  Psychiatric Judgement: confused, exam limited due to mild confusion  Dermatologic See M/S exam for extremity exam, No rashes otherwise noted  Lymphatic  Palpable lymph nodes: None   Laboratory   CBC CBC Latest Ref Rng & Units 09/04/2017 09/03/2017 09/03/2017  WBC 4.0 - 10.5 K/uL 12.7(H) 10.8(H) -  Hemoglobin 12.0 - 15.0 g/dL 8.3(L) 8.1(L) 9.4(L)  Hematocrit 36.0 - 46.0 % 24.5(L) 24.2(L) 27.2(L)  Platelets 150 - 400 K/uL 127(L) 129(L) -    BMP BMP Latest Ref Rng & Units 09/04/2017 09/03/2017 09/03/2017  Glucose 65 - 99 mg/dL 160(H) 138(H) 118(H)  BUN 6 - 20 mg/dL 21(H) 20 18  Creatinine 0.44 - 1.00 mg/dL 1.28(H) 1.40(H) 1.12(H)  BUN/Creat Ratio 6 - 22 (calc) - - -  Sodium 135 - 145 mmol/L 135 136 133(L)  Potassium 3.5 - 5.1 mmol/L 3.8 3.6 3.1(L)  Chloride 101 - 111 mmol/L 103 103 102  CO2 22 - 32 mmol/L 25 24 27   Calcium 8.9 - 10.3 mg/dL 8.4(L) 8.5(L) 8.3(L)    Coagulation Lab Results  Component Value Date   INR 1.62 09/04/2017   INR 1.65 09/03/2017   INR 1.80 09/03/2017   PTT: 37  Fibrinogen: 184  Lipids No results found for: CHOL, TRIG, HDL, CHOLHDL, VLDL, LDLCALC, LDLDIRECT   Radiology     Ct Chest W Contrast  Result Date: 09/03/2017 CLINICAL DATA:  Central line placement. EXAM: CT CHEST WITH CONTRAST TECHNIQUE: Multidetector CT imaging of the chest was performed during intravenous contrast administration. CONTRAST:  75 mL ISOVUE-300 IOPAMIDOL (ISOVUE-300) INJECTION 61% COMPARISON:  Chest radiograph 09/03/2017 FINDINGS: Cardiovascular: Right-sided vascular catheter traverses the right internal jugular vein and enters the right common carotid artery, with tip in the proximal aortic arch. There is atherosclerotic calcification in the aorta. Mild cardiomegaly. There is soft tissue edema in the right neck, extending into the upper right chest  wall and axilla. Mediastinum/Nodes: No mediastinal, hilar or axillary lymphadenopathy. The visualized thyroid and thoracic esophageal course are unremarkable. Lungs/Pleura: Lungs are clear. No pleural effusion or pneumothorax. Upper Abdomen: There is a large amount of free air in the anterior abdomen. The liver is diffusely nodular. Small volume ascites. Musculoskeletal: No chest wall abnormality. No acute or significant osseous findings. IMPRESSION: 1. Right-sided vascular access catheter is intra arterial with tip in the aortic arch. The catheter traverses the right internal jugular vein and enters the right common carotid artery before continuing inferiorly to the aortic arch. There is surrounding soft tissue edema that extends into the right upper chest wall and axilla. 2. Large volume pneumoperitoneum, consistent with recent perforation and subsequent abdominal surgery. 3.  Aortic Atherosclerosis (ICD10-I70.0). Critical Value/emergent results were called by telephone at the time of interpretation on 09/03/2017 at 10:35 pm to Dr. Aviva Signs , who verbally acknowledged these results. Electronically Signed  By: Ulyses Jarred M.D.   On: 09/03/2017 22:35   Dg Chest Port 1 View  Result Date: 09/03/2017 CLINICAL DATA:  Central line placement. EXAM: PORTABLE CHEST 1 VIEW COMPARISON:  Radiograph of September 01, 2017. FINDINGS: The heart size and mediastinal contours are within normal limits. Both lungs are clear. Hypoinflation of the lungs is noted. No pneumothorax or pleural effusion is noted. Interval placement of right-sided neck catheter. The tip is positioned more medially than expected and is concerning for possible arterial puncture. The visualized skeletal structures are unremarkable. IMPRESSION: Hypoinflation of the lungs. Interval placement of right-sided neck catheter. The distal tip is significantly more medial than would be expected for superior vena cava and internal jugular venous access. The possibility  of inadvertent arterial catheterization with tip in aorta cannot be excluded. Clinical correlation is recommended as well as further evaluation with CT scan. These results will be called to the ordering clinician or representative by the Radiologist Assistant, and communication documented in the PACS or zVision Dashboard. Electronically Signed   By: Marijo Conception, M.D.   On: 09/03/2017 18:03   I reviewed this patient's CTA neck, central line appears to entered CCA at a surgically accessible level.  No obvious thrombus is noted.   Medical Decision Making   Melanie Porter is a 77 y.o. female who presents with: s/p colectomy for perforation, cirrhosis, inadvertent right common carotid artery cannulation with central venous catheter   Will plan on: cutdown around right central catheter and then suture repair of the right common carotid artery.  Will likely need to place a small drain post-operatively.  The risk, benefits, and alternative for the procedure were discussed with the patient.    The patient is aware the risks include but are not limited to: bleeding, infection, myocardial infarction, stroke, nerve damage, need for additional procedures in the future, and wound complications.   The patient and family is aware of these risks and agreed to proceed.  Thank you for allowing [place] to participate in this patient's care.   Adele Barthel, MD, FACS [facility] Office: [phone] Pager: [phone]  09/04/2017, 11:58 AM

## 2017-09-04 NOTE — Anesthesia Procedure Notes (Signed)
Arterial Line Insertion Start/End6/08/2017 12:10 PM, 09/04/2017 12:20 PM Performed by: Carney Living, CRNA, CRNA  Patient location: Pre-op. Preanesthetic checklist: patient identified, IV checked, site marked, risks and benefits discussed, surgical consent, monitors and equipment checked, pre-op evaluation and timeout performed Left, radial was placed Catheter size: 20 G Hand hygiene performed  and maximum sterile barriers used   Attempts: 1 Procedure performed without using ultrasound guided technique. Following insertion, dressing applied and Biopatch. Post procedure assessment: normal and unchanged  Patient tolerated the procedure well with no immediate complications.

## 2017-09-04 NOTE — Progress Notes (Addendum)
Rhonda from [facility] called to give results of the 1 view portable c-xray that was done for verification of central line in IJ. Per Suanne Marker, Dr. Sabino Dick recommended a CT scan because of Rt. neck catheter tip in abnormal position concerning for possible arterial catherization and tip in aorta. Dr. Arnoldo Morale notified and orders were placed for a CT of chest and he gave the okay to continue to infuse through the line. Dr. Arnoldo Morale stated that after CT was completed that he would come in and place a femoral line if that was needed.  Citlali Gautney Rica Mote, RN

## 2017-09-04 NOTE — Anesthesia Procedure Notes (Signed)
Performed by: Taggart Prasad P, CRNA       

## 2017-09-04 NOTE — Consult Note (Signed)
Consultation Note Date: 09/04/2017   Patient Name: Melanie Porter  DOB: [birth year]  MRN: [id number]  Age / Sex: 77 y.o., female  PCP: Melanie Sites, MD Referring Physician: Aviva Signs, MD  Reason for Consultation: Establishing goals of care and Psychosocial/spiritual support  HPI/Patient Profile: 77 y.o. female  with past medical history of Melanie Porter cirrhosis, anxiety and depression, fibromyalgia, obesity, thrombocytopenia, GERD, hypercholesterolemia and hypothyroidism, colonoscopy 6/5 with bowel perf, in emergency surgery rest of day admitted on 09/01/2017 with anasarca and ascites and GI bleed.   Clinical Assessment and Goals of Care: Melanie Porter is lying in bed with nursing staff at bedside.  They are attending to dressing for her femoral central line.  Melanie Porter tells me that she is uncomfortable but is managing.  She is being prepped for transfer to [facility] hospital.  I encouraged her that we are doing the best we can for her.  Daughter Melanie Porter, Sister Melanie Porter, and Melanie Porter's friend Melanie Porter arrived at bedside.  We talked about transfer, we talked about her acute health problems.  [facility] is at bedside preparing for transport.  Limited conversation today.  Palliative team information provided, encouraged to call as needed.  I share the PMT will follow up, likely Friday. Conference with nursing staff related to plan of care, transfer. Conference with [facility] staff related to plan of care, transfer. Conference with hospitalist related to plan of care, transfer.  Healthcare power of attorney NEXT OF KIN -Melanie Porter names her son, Melanie Porter, [phone] as her surrogate decision-maker.  She also has a daughter, Melanie Porter. Her husband passed away in the last 6 months.  Sister Melanie Porter is at bedside.   SUMMARY OF RECOMMENDATIONS   At this point, 24 to 48 hours for outcomes. Continue psychosocial  support to patient and family.  Code Status/Advance Care Planning:  DNR  Symptom Management:   Per hospitalist, intensivist at this time.  Palliative Prophylaxis:   Frequent Pain Assessment and Turn Reposition  Additional Recommendations (Limitations, Scope, Preferences):  Continue to treat the treatable but no extraordinary measures such as CPR or intubation.  Psycho-social/Spiritual:   Desire for further Chaplaincy support:no  Additional Recommendations: Caregiving  Support/Resources  Prognosis:   Unable to determine, based on outcomes.  Serious condition, concern for ability to improve, survive this hospitalization.  Discharge Planning: To be determined, based on outcomes.      Primary Diagnoses: Present on Admission: . Anasarca . Thrombocytopenia ([place]) . PUD (peptic ulcer disease) . Hypothyroidism . Hyperlipidemia . Liver cirrhosis secondary to NASH ([place]) . Cirrhosis of liver with ascites ([place])   I have reviewed the medical record, interviewed the patient and family, and examined the patient. The following aspects are pertinent.  Past Medical History:  Diagnosis Date  . Ankle fracture, right 05/21/10  . Anxiety   . Arthritis   . Back fracture 05/21/10  . Depression   . Fibromyalgia   . GERD (gastroesophageal reflux disease)   . Hypercholesteremia   . 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 Knee fracture, right 05/21/10  . Nonalcoholic steatohepatitis (NASH) BMI > 40   NOV 2013 CHILD PUGH A, MELD SCORE 7. NOV 2017 CHILD PUGH B, MELD 9  . Obesity   . Splenomegaly 09/23/2012  . Thrombocytopenia ([place]) 02/19/2012   Social History   Socioeconomic History  . Marital status: Widowed    Spouse name: Not on file  . Number of children: Not on file  . Years of education: Not on file  . Highest education level: Not on file  Occupational History  . 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 Financial resource strain: Not on file  . Food insecurity:    Worry: Not on file    Inability: Not on file  . Transportation needs:    Medical: Not on file    Non-medical: Not on file  Tobacco Use  . Smoking status: Never Smoker  . Smokeless tobacco: Never Used  . Tobacco comment: Never smoked  Substance and Sexual Activity  . Alcohol use: No    Alcohol/week: 0.0 oz  . Drug use: No  . Sexual activity: Never  Lifestyle  . Physical activity:    Days per week: Not on file    Minutes per session: Not on file  . Stress: Not on file  Relationships  . Social connections:    Talks on phone: Not on file    Gets together: Not on file    Attends religious service: Not on file    Active member of club or organization: Not on file    Attends meetings of clubs or organizations: Not on file    Relationship status: Not on file  Other Topics Concern  . Not on file  Social History Narrative   HAS A HUSBAND WHO MAY HAVE DEMENTIA AND IS CHALLENGING TO CARE FOR.   Family History  Problem Relation Age of Onset  . Heart disease Father   . Heart attack Father   . Aneurysm Mother   . Diabetes Sister   . Hypertension Sister   . Heart disease Sister   . Hypertension Brother   . Heart disease Brother   . Colon cancer Neg Hx    Scheduled Meds: . [MAR Hold] chlorhexidine  15 mL Mouth Rinse BID  . [MAR Hold] Chlorhexidine Gluconate Cloth  6 each Topical Once  . [MAR Hold] Chlorhexidine Gluconate Cloth  6 each Topical Daily  . [MAR Hold] levothyroxine  100 mcg Oral QAC breakfast  . [MAR Hold] levothyroxine  75 mcg Oral QAC breakfast  . [MAR Hold] mouth rinse  15 mL Mouth Rinse q12n4p  . [MAR Hold] nystatin cream   Topical BID  . [MAR Hold] sodium chloride flush  10-40 mL Intracatheter Q12H  . [MAR Hold] sodium chloride flush  3 mL Intravenous Q12H  . [MAR Hold] sodium chloride flush  3 mL Intravenous Q12H   Continuous Infusions: . [MAR Hold] sodium chloride 250 mL (09/02/17 0301)  .  sodium chloride 1,000 mL (09/03/17 1150)  . [MAR Hold] ertapenem (INVANZ) IV    . [MAR Hold] famotidine (PEPCID) IV Stopped (09/04/17 0940)  . [MAR Hold] norepinephrine (LEVOPHED) Adult infusion 6 mcg/min (09/04/17 1523)   PRN Meds:.[MAR Hold] sodium chloride, [MAR Hold] acetaminophen **OR** [MAR Hold] acetaminophen, [MAR Hold] hydrocortisone-pramoxine, [MAR Hold]  HYDROmorphone (DILAUDID) injection, [MAR Hold] LORazepam, [MAR Hold] ondansetron **OR** [MAR Hold] ondansetron (ZOFRAN) IV, [MAR Hold] polyvinyl alcohol, [MAR Hold] sodium chloride  flush, [MAR Hold] sodium chloride flush Medications Prior to Admission:  Prior to Admission medications   Medication Sig Start Date End Date Taking? Authorizing Provider  aspirin EC 81 MG tablet Take 81 mg by mouth at bedtime.   Yes [provider]  Cholecalciferol (VITAMIN D3) 5000 units CAPS Take 1 capsule daily by mouth.   Yes [provider]  FLUoxetine (PROZAC) 20 MG capsule Take 20 mg by mouth daily. 12/25/15  Yes [provider]  furosemide (LASIX) 80 MG tablet Take 1 tablet (80 mg total) by mouth 2 (two) times daily. 07/29/17  Yes Tat, Shanon Brow, MD  KLOR-CON M20 20 MEQ tablet Take 20 mEq by mouth daily. 07/06/17  Yes [provider]  levothyroxine (SYNTHROID, LEVOTHROID) 100 MCG tablet Take 100 mcg by mouth daily. 08/06/17  Yes [provider]  lidocaine-hydrocortisone (ANAMANTEL HC) 3-0.5 % CREA Apply to the external hemorrhoid area 4 times daily as needed 06/20/17  Yes Fontaine, Belinda Block, MD  loratadine-pseudoephedrine (CLARITIN-D 24-HOUR) 10-240 MG 24 hr tablet Take 1 tablet by mouth daily as needed for allergies.   Yes [provider]  magnesium oxide (MAG-OX) 400 (241.3 Mg) MG tablet Take 1 tablet (400 mg total) by mouth daily. 07/29/17  Yes Tat, Shanon Brow, MD  pantoprazole (PROTONIX) 40 MG tablet Take 1 tablet (40 mg total) by mouth daily. 03/23/15  Yes Annitta Needs, NP  Propylene Glycol (SYSTANE BALANCE)  0.6 % SOLN Apply 1 drop to eye daily as needed. Dry Eyes   Yes [provider]  simvastatin (ZOCOR) 40 MG tablet Take 40 mg by mouth daily.    Yes [provider]  spironolactone (ALDACTONE) 50 MG tablet Take 1 tablet (50 mg total) by mouth 2 (two) times daily. 07/29/17  Yes TatShanon Brow, MD   No Known Allergies Review of Systems  Unable to perform ROS: Acuity of condition    Physical Exam  Constitutional: No distress.  Appears acutely/chronically ill, makes and briefly keeps eye contact  HENT:  Head: Atraumatic.  Cardiovascular: Normal rate.  Pulmonary/Chest: Effort normal. No respiratory distress.  Abdominal: Soft. She exhibits no distension.  Midline surgical dressing intact, RLQ drain with serosanguineous drainage  Musculoskeletal: She exhibits no edema or deformity.  Neurological: She is alert.  Orientation questions not asked due to acuity of illness  Skin: Skin is warm and dry.  Nursing note and vitals reviewed.   Vital Porter: BP (!) 110/51   Pulse 88   Temp (!) 97.4 F (36.3 C)   Resp 19   Ht 5\' 2"  (1.575 m)   Wt 85.5 kg (188 lb 7.9 oz)   SpO2 100%   BMI 34.48 kg/m  Pain Scale: 0-10   Pain Score: Asleep   SpO2: SpO2: 100 % O2 Device:SpO2: 100 % O2 Flow Rate: .O2 Flow Rate (L/min): 2 L/min  IO: Intake/output summary:   Intake/Output Summary (Last 24 hours) at 09/04/2017 1601 Last data filed at 09/04/2017 1549 Gross per 24 hour  Intake 3568.54 ml  Output 3555 ml  Net 13.54 ml    LBM: Last BM Date: 09/03/17 Baseline Weight: Weight: 93 kg (205 lb) Most recent weight: Weight: 85.5 kg (188 lb 7.9 oz)     Palliative Assessment/Data:   Flowsheet Rows     Most Recent Value  Intake Tab  Referral Department  Hospitalist  Unit at Time of Referral  Cardiac/Telemetry Unit  Palliative Care Primary Diagnosis  Other (Comment)  Date Notified  09/03/17  Palliative Care Type  New Palliative care  Reason for referral  Clarify Goals of Care  Date of  Admission  09/01/17  Date first seen by Palliative Care  09/03/17  # of days Palliative referral response time  0 Day(s)  # of days IP prior to Palliative referral  2  Clinical Assessment  Palliative Performance Scale Score  30%  Pain Max last 24 hours  Not able to report  Pain Min Last 24 hours  Not able to report  Dyspnea Max Last 24 Hours  Not able to report  Dyspnea Min Last 24 hours  Not able to report  Psychosocial & Spiritual Assessment  Palliative Care Outcomes  Patient/Family meeting held?  No [no family at bedside, emergency surgery]      Time In: 0920 Time Out: 0950 Time Total: 30 minutes Greater than 50%  of this time was spent counseling and coordinating care related to the above assessment and plan.  Signed by: Drue Novel, NP   Please contact Palliative Medicine Team phone at [phone] for questions and concerns.  For individual provider: See Shea Evans

## 2017-09-04 NOTE — Op Note (Addendum)
OPERATIVE NOTE   PROCEDURE: 1. Right neck exploration 2. Repair of right internal jugular vein  3. 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  Placement of cental vein catheter thru and thru the right internal jugular vein  2.  Some thrombus between the internal jugular vein and common carotid artery  3.  No frank thrombus in common carotid artery after flushing the common carotid artery  4.  Lateral wall cannulation of right common carotid artery   SPECIMEN(S):  none  INDICATIONS:   Melanie Porter is a 77 y.o. female who presents with inadvertent placement of central venous catheter in right common carotid artery.  The catheter was not removed due to coagulopathy related to her cirrhosis.  I recommended: exploration of right neck, repair of common carotid artery.  The patient is aware the risks include but are not limited to: bleeding, infection, myocardial infarction, stroke, nerve damage, need for additional procedures in the future, and wound complications.  The patient and family is aware of these risks and agreed to proceed.   DESCRIPTION: After obtaining full informed written consent, the patient was brought back to the operating room and placed supine upon the operating table.  The patient received IV antibiotics prior to induction.  A procedure time out was completed and the correct surgical site was verified.  After obtaining adequate anesthesia, the patient was prepped and draped in the standard fashion for: right neck exploration. I immediately gave 8000 units of Heparin which was a therapeutic dose.  I transected the securing stitches to the central catheter so I could manipulate the  catheter.  I secure it in place with a Tegraderm.  I made a longitudinal incision centered around the central venous catheter.  I dissected through the muscle and fascia down to the right internal jugular vein.  I could see that the central catheter had gone through the lateral internal jugular vein wall and out the medial wall of the vein.  I transected the catheter distally and pulled the catheter through the vein.  I repaired the internal jugular vein venotomies x 2 with 6-0 Prolene stitches.    I retracted the internal jugular vein out of the way and continued the dissection.  The catheter was found to be entering the lateral wall of the common carotid artery.  In this process of dissection, I found between the vein and common carotid artery thrombus, so I had concerns of possible thrombus surrounding the catheter within the common carotid artery.  Subsequently,I dissected out the common carotid artery distal to the catheter.  I placed a vessel loop for control.  I then dissected the common carotid artery proximal to the catheter.  I placed a double loop around the proximal common carotid artery.   Because of the concern for thrombus, I felt that flushing out the common carotid artery was necessary.  I clamped the common carotid artery distally to avoid embolization.  I pulled out the catheter and then pulled up on the proximal common carotid artery.  I transversely extended the arterial puncture with a Potts scissor.  I washed out the lumen of the common carotid artery with heparinized saline.  I did not see any thrombus.  I then flushed out  100 cc out the common carotid artery by releasing the proximal vessel loop.  I clamped the proximal common carotid artery.  I washed out the common carotid artery lumen: no thrombus was noted.  I repaired this right common carotid artery with two running stitches of 6-0 Prolene, tied together.  Prior to completion, I backbled the proximal common carotid artery, followed  by the distal common carotid artery.  I completed the repair in the usual fashion.    I gave 50 mg of Protamine to reverse anticoagulation.  I packed the neck with Avitene.  After waiting a few minutes, I removed the Avitene.  There did not appear to be active bleeding.    Due this patient's coagulopathy related to cirrhosis, I felt placement of a drain was necessary.  I passed the TLS trocar through the subcutaneous tissue inferior to the incision line.  I pulled the drain to appropriate position and secured it with a 3-0 Silk suture tied to the drain.  I shortened the drain to appropriate length and place it adjacent to the artery.  The exterior hardware was attached to the drain and the test tube was not attached until the skin was close.    The platysma muscle was reapproximated with a 3-0 Vicryl.  The skin was reapproximated with a running subcuticular stitch of 4-0 Monocryl.  The skin was cleaned, dried, and reinforced with Dermabond.   COMPLICATIONS: none  CONDITION: stable   Adele Barthel, MD, [facility] [place] Office: [phone] Pager: [phone]  09/04/2017, 1:46 PM

## 2017-09-04 NOTE — Progress Notes (Signed)
Dr Jefm Petty updated-pt to remain on Levophed drip to keep MAP around 70. Drip increased back to  6 mcg/min.

## 2017-09-04 NOTE — H&P (Signed)
PULMONARY / CRITICAL CARE MEDICINE   Name: Melanie Porter MRN: [id number] DOB: [birth year]    ADMISSION DATE:  09/01/2017 CONSULTATION DATE:  6/6  REFERRING MD:  Tat Deneise Lever Penn)   CHIEF COMPLAINT:  Peritonitis, malpositioned R IJ   HISTORY OF PRESENT ILLNESS:   77yo female with hx NASH cirrhosis, hypothyroidism, thrombocytopenia with recent admission 3/31-4/3 and again 4/24-4/30 for decompensated cirrhosis and was diureses and had multiple paracentesis. She returned to [facility] 6/3 again with anasarca, hematochezia and decompensated cirrhosis.  She underwent colonoscopy 6/5 which was complicated by colonic perforation requiring exploratory lap with partial colectomy.  Post op she had difficulties with volume status and labile BP requiring vasopressors and central line was placed.  Unfortunately CT of the chest revealed that R IJ CVL crossed the midline and entered R carotid with tip in the aortic arch.  She was tx 6/6 to [facility] for vascular surgery and PCCM consulted for ICU admission/medical management.   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  PAST SURGICAL HISTORY: She  has a past surgical history that includes Breast biopsy (1982); Partial hysterectomy; Esophagogastroduodenoscopy (03/02/2012); Esophagogastroduodenoscopy (N/A, 07/15/2014); Abdominal hysterectomy; Colonoscopy (June 2010); Esophagogastroduodenoscopy (N/A, 06/05/2015); Cataract extraction w/PHACO (Right, 07/11/2015); and Cataract extraction w/PHACO (Left, 07/25/2015).  No Known Allergies  No current facility-administered  medications on file prior to encounter.    Current Outpatient Medications on File Prior to Encounter  Medication Sig  . aspirin EC 81 MG tablet Take 81 mg by mouth at bedtime.  . Cholecalciferol (VITAMIN D3) 5000 units CAPS Take 1 capsule daily by mouth.  Marland Kitchen FLUoxetine (PROZAC) 20 MG capsule Take 20 mg by mouth daily.  . furosemide (LASIX) 80 MG tablet Take 1 tablet (80 mg total) by mouth 2 (two) times daily.  Marland Kitchen KLOR-CON M20 20 MEQ tablet Take 20 mEq by mouth daily.  Marland Kitchen levothyroxine (SYNTHROID, LEVOTHROID) 100 MCG tablet Take 100 mcg by mouth daily.  Marland Kitchen lidocaine-hydrocortisone (ANAMANTEL HC) 3-0.5 % CREA Apply to the external hemorrhoid area 4 times daily as needed  . loratadine-pseudoephedrine (CLARITIN-D 24-HOUR) 10-240 MG 24 hr tablet Take 1 tablet by mouth daily as needed for allergies.  . magnesium oxide (MAG-OX) 400 (241.3 Mg) MG tablet Take 1 tablet (400 mg total) by mouth daily.  . pantoprazole (PROTONIX) 40 MG tablet Take 1 tablet (40 mg total) by mouth daily.  Marland Kitchen Propylene Glycol (SYSTANE BALANCE) 0.6 % SOLN Apply 1 drop to eye daily as needed. Dry Eyes  . simvastatin (ZOCOR) 40 MG tablet Take 40 mg by mouth daily.   Marland Kitchen spironolactone (ALDACTONE) 50 MG tablet Take 1 tablet (50 mg total) by mouth 2 (two) times daily.    FAMILY HISTORY:  Her indicated that her mother is deceased. She indicated that her father is deceased. She indicated that the status of her sister is unknown. She indicated that the status of her brother is unknown. She indicated that the status of her neg hx is unknown.   SOCIAL HISTORY: She  reports that she has never smoked. She has never used smokeless tobacco. She reports that she does not drink alcohol or use drugs.  REVIEW OF SYSTEMS:   Unable - sedated post op.  As per  HPI above obtained from staff and records.   SUBJECTIVE:    VITAL SIGNS: BP (!) 110/51   Pulse 88   Temp (!) 97.4 F (36.3 C)   Resp 19   Ht 5\' 2"  (1.575 m)   Wt 85.5 kg (188 lb 7.9  oz)   SpO2 100%   BMI 34.48 kg/m   HEMODYNAMICS:    VENTILATOR SETTINGS:    INTAKE / OUTPUT: I/O last 3 completed shifts: In: 3520.2 [P.O.:120; I.V.:2920.2; Blood:430; IV Piggyback:50] Out: 0254 [YHCWC:3762; Drains:2075; Other:3300; Blood:100]  PHYSICAL EXAMINATION: General: Chronically ill-appearing female, no acute distress, drowsy postop, seen in PACU Neuro: Drowsy post anesthesia.  Opens eyes, follows some commands, groans HEENT: MM moist, no JVD, right neck drain in place Cardiovascular: S1-S2 RRR Lungs: Respirations are even and nonlabored, diminished bases otherwise fairly clear Abdomen: Slightly distended, midline incision clean and dry, JP drain with serosanguineous drainage Musculoskeletal: Warm and dry, 1+ BLE edema   LABS:  BMET Recent Labs  Lab 09/03/17 0438 09/03/17 2219 09/04/17 0433  NA 133* 136 135  K 3.1* 3.6 3.8  CL 102 103 103  CO2 27 24 25   BUN 18 20 21*  CREATININE 1.12* 1.40* 1.28*  GLUCOSE 118* 138* 160*    Electrolytes Recent Labs  Lab 09/01/17 1120  09/03/17 0438 09/03/17 2219 09/04/17 0433  CALCIUM  --    < > 8.3* 8.5* 8.4*  MG 1.6*  --   --   --  1.3*  PHOS 3.1  --   --   --  3.3   < > = values in this interval not displayed.    CBC Recent Labs  Lab 09/02/17 1022 09/03/17 1649 09/03/17 2219 09/04/17 0433  WBC 4.7  --  10.8* 12.7*  HGB 10.8* 9.4* 8.1* 8.3*  HCT 33.0* 27.2* 24.2* 24.5*  PLT 50*  --  129* 127*    Coag's Recent Labs  Lab 09/03/17 0438 09/03/17 2219 09/04/17 0433  APTT  --  36 37*  INR 1.80 1.65 1.62    Sepsis Markers No results for input(s): LATICACIDVEN, PROCALCITON, O2SATVEN in the last 168 hours.  ABG No results for input(s): PHART, PCO2ART, PO2ART in the last 168 hours.  Liver Enzymes Recent Labs  Lab 09/03/17 0438 09/03/17 2219 09/04/17 0433  AST 41 37 33  ALT 22 23 21   ALKPHOS 105 84 81  BILITOT 1.8* 1.7* 1.6*  ALBUMIN 2.6* 2.7* 2.7*    Cardiac Enzymes No results for  input(s): TROPONINI, PROBNP in the last 168 hours.  Glucose No results for input(s): GLUCAP in the last 168 hours.  Imaging Ct Chest W Contrast  Result Date: 09/03/2017 CLINICAL DATA:  Central line placement. EXAM: CT CHEST WITH CONTRAST TECHNIQUE: Multidetector CT imaging of the chest was performed during intravenous contrast administration. CONTRAST:  75 mL ISOVUE-300 IOPAMIDOL (ISOVUE-300) INJECTION 61% COMPARISON:  Chest radiograph 09/03/2017 FINDINGS: Cardiovascular: Right-sided vascular catheter traverses the right internal jugular vein and enters the right common carotid artery, with tip in the proximal aortic arch. There is atherosclerotic calcification in the aorta. Mild cardiomegaly. There is soft tissue edema in the right neck, extending into the upper right chest wall and axilla. Mediastinum/Nodes: No mediastinal, hilar or axillary lymphadenopathy. The visualized thyroid and thoracic esophageal course are unremarkable. Lungs/Pleura: Lungs are clear. No pleural effusion or pneumothorax. Upper Abdomen: There is a large amount of free air in the anterior abdomen. The liver is diffusely nodular. Small volume ascites. Musculoskeletal: No chest wall abnormality. No acute  or significant osseous findings. IMPRESSION: 1. Right-sided vascular access catheter is intra arterial with tip in the aortic arch. The catheter traverses the right internal jugular vein and enters the right common carotid artery before continuing inferiorly to the aortic arch. There is surrounding soft tissue edema that extends into the right upper chest wall and axilla. 2. Large volume pneumoperitoneum, consistent with recent perforation and subsequent abdominal surgery. 3.  Aortic Atherosclerosis (ICD10-I70.0). Critical Value/emergent results were called by telephone at the time of interpretation on 09/03/2017 at 10:35 pm to Dr. Aviva Signs , who verbally acknowledged these results. Electronically Signed   By: Ulyses Jarred M.D.   On:  09/03/2017 22:35   Dg Chest Port 1 View  Result Date: 09/03/2017 CLINICAL DATA:  Central line placement. EXAM: PORTABLE CHEST 1 VIEW COMPARISON:  Radiograph of September 01, 2017. FINDINGS: The heart size and mediastinal contours are within normal limits. Both lungs are clear. Hypoinflation of the lungs is noted. No pneumothorax or pleural effusion is noted. Interval placement of right-sided neck catheter. The tip is positioned more medially than expected and is concerning for possible arterial puncture. The visualized skeletal structures are unremarkable. IMPRESSION: Hypoinflation of the lungs. Interval placement of right-sided neck catheter. The distal tip is significantly more medial than would be expected for superior vena cava and internal jugular venous access. The possibility of inadvertent arterial catheterization with tip in aorta cannot be excluded. Clinical correlation is recommended as well as further evaluation with CT scan. These results will be called to the ordering clinician or representative by the Radiologist Assistant, and communication documented in the PACS or zVision Dashboard. Electronically Signed   By: Marijo Conception, M.D.   On: 09/03/2017 18:03     STUDIES:  CT chest 6/5>>> 1. Right-sided vascular access catheter is intra arterial with tip in the aortic arch. The catheter traverses the right internal jugular vein and enters the right common carotid artery before continuing inferiorly to the aortic arch. There is surrounding soft tissue edema that extends into the right upper chest wall and axilla.  2. Large volume pneumoperitoneum, consistent with recent perforation and subsequent abdominal surgery. 3.  Aortic Atherosclerosis (ICD10-I70.0).  CULTURES:   ANTIBIOTICS: Ertapenem 6/6>>>   SIGNIFICANT EVENTS: 6/5 OR >> exploratory lap, repair of colonic perforation  6/6 OR>> removal of malpositioned, arterially placed central line   LINES/TUBES: R IJ CVL  6/5>>>6/6  DISCUSSION: 77yo female with hx NASH cirrhosis and multiple recent admissions for anasarca admitted 6/3 with decompensated cirrhosis and hematochezia.  Underwent colonoscopy which was complicated by colonic perforation requiring exploratory lap.  Had R IJ CVL placed for pressor needs which unfortunately crossed the midline and terminated in carotid requiring tx to [facility] for vascular surgery.   ASSESSMENT / PLAN:  PULMONARY No active issue P:   Pulmonal O2 as needed Pulmonary hygiene Mobilize postop as able Intermittent follow-up chest x-ray  CARDIOVASCULAR Hypotension, multifactorial in the setting of fluid shifts, mild blood loss anemia, Sirs SIRS Chronic diastolic CHF-does not appear acutely decompensated Malpositioned right IJ CVL into carotid- status post OR removal by vascular surgery Anasarca-improved P:  Continue low-dose levophed-wean as able to keep map greater than 65 Avoid further volume for now Holding Lasix and Aldactone due to hypotension  RENAL AKI-mild Hypokalemia Hypomg P:   Follow-up chemistry Holding diuretics for now as above Monitor urine output closely replace electrolytes as needed  GASTROINTESTINAL [name] cirrhosis Ascites Hematochezia - resolved  Perforated colon-  Iatrogenic.  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 Note DNR.  Palliative care has been following patient and family meeting is pending.  - Inter-disciplinary family meet or Palliative Care meeting due by:  Day 7    Nickolas Madrid, NP 09/04/2017  3:50 PM Pager: [phone] or [phone]

## 2017-09-04 NOTE — Progress Notes (Addendum)
Pt adm to PACU on Levophed drip4 mcg/min from OR-weaning as BP tolerates. Will continue to monitor.

## 2017-09-04 NOTE — Anesthesia Postprocedure Evaluation (Signed)
Anesthesia Post Note  Patient: [place]  Procedure(s) Performed: Removal of Central line from Right Carotid artery, Repair of Right Common Carotid & Right internal jugular. (Right Neck)     Patient location during evaluation: PACU Anesthesia Type: General Level of consciousness: awake and alert Pain management: pain level controlled Vital Signs Assessment: post-procedure vital signs reviewed and stable Respiratory status: spontaneous breathing, nonlabored ventilation, respiratory function stable and patient connected to nasal cannula oxygen Cardiovascular status: blood pressure returned to baseline and stable Postop Assessment: no apparent nausea or vomiting Anesthetic complications: no    Last Vitals:  Vitals:   09/04/17 1604 09/04/17 1605  BP: (!) 118/44   Pulse:  89  Resp:  16  Temp: 36.4 C   SpO2:  100%    Last Pain:  Vitals:   09/04/17 1604  TempSrc: Oral  PainSc:                  Keyatta Tolles,W. EDMOND

## 2017-09-04 NOTE — Progress Notes (Signed)
eLink Physician-Brief Progress Note Patient Name: Melanie Porter DOB: [birth year] MRN: [id number]   Date of Service  09/04/2017  HPI/Events of Note  Pt s/p bowel perforation and septic shock. She had exploratory laparotomy. Diminished urine output tonight likely volume depletion.  eICU Interventions  5 % Albumin 500 ml iv x 1        Melanie Porter U Melanie Porter 09/04/2017, 11:34 PM

## 2017-09-05 ENCOUNTER — Encounter (HOSPITAL_COMMUNITY): Payer: Self-pay | Admitting: General Surgery

## 2017-09-05 LAB — COMPREHENSIVE METABOLIC PANEL
ALT: 17 U/L (ref 14–54)
AST: 23 U/L (ref 15–41)
Albumin: 2.7 g/dL — ABNORMAL LOW (ref 3.5–5.0)
Alkaline Phosphatase: 63 U/L (ref 38–126)
Anion gap: 6 (ref 5–15)
BUN: 18 mg/dL (ref 6–20)
CALCIUM: 8.3 mg/dL — AB (ref 8.9–10.3)
CHLORIDE: 105 mmol/L (ref 101–111)
CO2: 24 mmol/L (ref 22–32)
Creatinine, Ser: 1.29 mg/dL — ABNORMAL HIGH (ref 0.44–1.00)
GFR, EST AFRICAN AMERICAN: 45 mL/min — AB (ref >60)
GFR, EST NON AFRICAN AMERICAN: 39 mL/min — AB (ref >60)
Glucose, Bld: 134 mg/dL — ABNORMAL HIGH (ref 65–99)
Potassium: 4 mmol/L (ref 3.5–5.1)
Sodium: 135 mmol/L (ref 135–145)
Total Bilirubin: 1.7 mg/dL — ABNORMAL HIGH (ref 0.3–1.2)
Total Protein: 4.9 g/dL — ABNORMAL LOW (ref 6.5–8.1)

## 2017-09-05 LAB — BLOOD PRODUCT ORDER (VERBAL) VERIFICATION

## 2017-09-05 LAB — CBC
HCT: 20.8 % — ABNORMAL LOW (ref 36.0–46.0)
Hemoglobin: 7 g/dL — ABNORMAL LOW (ref 12.0–15.0)
MCH: 34.8 pg — AB (ref 26.0–34.0)
MCHC: 33.7 g/dL (ref 30.0–36.0)
MCV: 103.5 fL — AB (ref 78.0–100.0)
PLATELETS: 75 10*3/uL — AB (ref 150–400)
RBC: 2.01 MIL/uL — AB (ref 3.87–5.11)
RDW: 13.2 % (ref 11.5–15.5)
WBC: 8.4 10*3/uL (ref 4.0–10.5)

## 2017-09-05 LAB — PROCALCITONIN
PROCALCITONIN: 4.7 ng/mL
PROCALCITONIN: 4.05 ng/mL

## 2017-09-05 LAB — HEMOGLOBIN AND HEMATOCRIT, BLOOD
HCT: 25.9 % — ABNORMAL LOW (ref 36.0–46.0)
Hemoglobin: 8.8 g/dL — ABNORMAL LOW (ref 12.0–15.0)

## 2017-09-05 LAB — PREPARE RBC (CROSSMATCH)

## 2017-09-05 MED ORDER — ACETAMINOPHEN 325 MG PO TABS: 325.0000 mg | ORAL_TABLET | Freq: Four times a day (QID) | ORAL | Status: DC | PRN

## 2017-09-05 MED ORDER — VITAMIN K1 10 MG/ML IJ SOLN
10.0000 mg | Freq: Once | INTRAVENOUS | Status: AC
  Administered 2017-09-05: 10 mg via INTRAVENOUS
  Filled 2017-09-05: qty 1

## 2017-09-05 MED ORDER — ACETAMINOPHEN 650 MG RE SUPP: 325.0000 mg | Freq: Four times a day (QID) | RECTAL | Status: DC | PRN

## 2017-09-05 MED ORDER — ALBUMIN HUMAN 25 % IV SOLN
25.0000 g | Freq: Four times a day (QID) | INTRAVENOUS | Status: AC
  Administered 2017-09-05 – 2017-09-08 (×12): 25 g via INTRAVENOUS
  Filled 2017-09-05: qty 50
  Filled 2017-09-05: qty 100
  Filled 2017-09-05: qty 50
  Filled 2017-09-05: qty 100
  Filled 2017-09-05: qty 50
  Filled 2017-09-05: qty 100
  Filled 2017-09-05 (×4): qty 50
  Filled 2017-09-05 (×3): qty 100
  Filled 2017-09-05: qty 50
  Filled 2017-09-05: qty 100
  Filled 2017-09-05: qty 50

## 2017-09-05 MED ORDER — SODIUM CHLORIDE 0.9 % IV SOLN
Freq: Once | INTRAVENOUS | Status: AC
  Administered 2017-09-05: 05:00:00 via INTRAVENOUS

## 2017-09-05 NOTE — Progress Notes (Signed)
PULMONARY / CRITICAL CARE MEDICINE   Name: Melanie Porter MRN: [id number] DOB: 21-May-1940    ADMISSION DATE:  09/01/2017 CONSULTATION DATE:  6/6  REFERRING MD:  Tat Deneise Lever [place])   CHIEF COMPLAINT:  Peritonitis, malpositioned R IJ   HISTORY OF PRESENT ILLNESS:   77yo female with hx NASH cirrhosis, hypothyroidism, thrombocytopenia with recent admission 3/31-4/3 and again 4/24-4/30 for decompensated cirrhosis and was diureses and had multiple paracentesis. She returned to [facility] 6/3 again with anasarca, hematochezia and decompensated cirrhosis.  She underwent colonoscopy 6/5 which was complicated by colonic perforation requiring exploratory lap with partial colectomy.  Post op she had difficulties with volume status and labile BP requiring vasopressors and central line was placed.  Unfortunately CT of the chest revealed that R IJ CVL crossed the midline and entered R carotid with tip in the aortic arch.  She was tx 6/6 to [facility] for vascular surgery and PCCM consulted for ICU admission/medical management.  Today she is complaining of some abdominal discomfort.  She has not had any further rectal bleeding, although she has been transfused this morning for hemoglobin of 7. She is again on levo fed at 62mcg/min.  She is denying dyspnea. 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  PAST SURGICAL HISTORY: She  has a past surgical history that includes Breast biopsy (1982); Partial hysterectomy; Esophagogastroduodenoscopy (03/02/2012); Esophagogastroduodenoscopy (N/A, 07/15/2014); Abdominal  hysterectomy; Colonoscopy (June 2010); Esophagogastroduodenoscopy (N/A, 06/05/2015); Cataract extraction w/PHACO (Right, 07/11/2015); Cataract extraction w/PHACO (Left, 07/25/2015); laparotomy (N/A, 09/03/2017); and Partial colectomy (09/03/2017).  No Known Allergies  No current facility-administered medications on file prior to encounter.    Current Outpatient Medications on File Prior to Encounter  Medication Sig  . aspirin EC 81 MG tablet Take 81 mg by mouth at bedtime.  . Cholecalciferol (VITAMIN D3) 5000 units CAPS Take 1 capsule daily by mouth.  Marland Kitchen FLUoxetine (PROZAC) 20 MG capsule Take 20 mg by mouth daily.  . furosemide (LASIX) 80 MG tablet Take 1 tablet (80 mg total) by mouth 2 (two) times daily.  Marland Kitchen KLOR-CON M20 20 MEQ tablet Take 20 mEq by mouth daily.  Marland Kitchen levothyroxine (SYNTHROID, LEVOTHROID) 100 MCG tablet Take 100 mcg by mouth daily.  Marland Kitchen lidocaine-hydrocortisone (ANAMANTEL HC) 3-0.5 % CREA Apply to the external hemorrhoid area 4 times daily as needed  . loratadine-pseudoephedrine (CLARITIN-D 24-HOUR) 10-240 MG 24 hr tablet Take 1 tablet by mouth daily as needed for allergies.  . magnesium oxide (MAG-OX) 400 (241.3 Mg) MG tablet Take 1 tablet (400 mg total) by mouth daily.  . pantoprazole (PROTONIX) 40 MG tablet Take 1 tablet (40 mg total) by mouth daily.  Marland Kitchen Propylene Glycol (SYSTANE BALANCE) 0.6 % SOLN Apply 1 drop to eye daily as needed. Dry Eyes  . simvastatin (ZOCOR) 40 MG tablet Take 40 mg by mouth daily.   Marland Kitchen spironolactone (ALDACTONE) 50 MG tablet Take 1 tablet (50 mg total) by mouth 2 (two) times daily.    FAMILY HISTORY:  Her indicated that her mother is deceased. She indicated that her father is deceased. She indicated that the status of her sister is unknown. She indicated that the status of her brother is unknown. She indicated that the status of her neg  hx is unknown.   SOCIAL HISTORY: She  reports that she has never smoked. She has never used smokeless tobacco. She reports  that she does not drink alcohol or use drugs.  REVIEW OF SYSTEMS:   Review of records shows an echocardiogram performed on 4/1 which shows moderate aortic stenosis and normal LV systolic function.  SUBJECTIVE:    VITAL SIGNS: BP (!) 116/43   Pulse 76   Temp 97.9 F (36.6 C) (Oral)   Resp (!) 32   Ht 5\' 2"  (1.575 m)   Wt 193 lb 9 oz (87.8 kg)   SpO2 93%   BMI 35.40 kg/m   HEMODYNAMICS:    VENTILATOR SETTINGS:    INTAKE / OUTPUT: I/O last 3 completed shifts: In: 5612.8 [I.V.:4362.8; Blood:400; Other:100; IV Piggyback:750] Out: 6967 [ELFYB:0175; Drains:3825; Blood:100]  PHYSICAL EXAMINATION: General: Easily awakened and appropriately interactive however somewhat disoriented.  In no overt distress.   Neuro: She is quick to respond to questions, but cannot tell me the exact day or date.   She moves all fours on request, there is no asterixis.  Pupils are equal.  Face is symmetric. ENT: MM moist, no JVD, right neck drain in place Cardiovascular: S1 and S2 are regular with a 4 out of 6 systolic ejection murmur  Lungs: Respirations are unlabored, there is symmetric air movement, no wheezes, no rhonchi  Abdomen: The abdomen is obese and soft without any unusual postoperative tenderness.  The incision is well opposed without drainage.  There is serous slightly blood-tinged drainage from the JPs.   Musculoskeletal: Warm and dry, 1+ BLE edema   LABS:  BMET Recent Labs  Lab 09/03/17 2219 09/04/17 0433 09/05/17 0357  NA 136 135 135  K 3.6 3.8 4.0  CL 103 103 105  CO2 24 25 24   BUN 20 21* 18  CREATININE 1.40* 1.28* 1.29*  GLUCOSE 138* 160* 134*    Electrolytes Recent Labs  Lab 09/01/17 1120  09/03/17 2219 09/04/17 0433 09/05/17 0357  CALCIUM  --    < > 8.5* 8.4* 8.3*  MG 1.6*  --   --  1.3*  --   PHOS 3.1  --   --  3.3  --    < > = values in this interval not displayed.    CBC Recent Labs  Lab 09/03/17 2219 09/04/17 0433 09/05/17 0357  WBC 10.8* 12.7*  8.4  HGB 8.1* 8.3* 7.0*  HCT 24.2* 24.5* 20.8*  PLT 129* 127* 75*    Coag's Recent Labs  Lab 09/03/17 0438 09/03/17 2219 09/04/17 0433  APTT  --  36 37*  INR 1.80 1.65 1.62    Sepsis Markers Recent Labs  Lab 09/04/17 1625 09/04/17 2003 09/05/17 0357  LATICACIDVEN 1.8  1.9  --   --   PROCALCITON  --  5.40 4.70    ABG No results for input(s): PHART, PCO2ART, PO2ART in the last 168 hours.  Liver Enzymes Recent Labs  Lab 09/03/17 2219 09/04/17 0433 09/05/17 0357  AST 37 33 23  ALT 23 21 17   ALKPHOS 84 81 63  BILITOT 1.7* 1.6* 1.7*  ALBUMIN 2.7* 2.7* 2.7*    Cardiac Enzymes No results for input(s): TROPONINI, PROBNP in the last 168 hours.  Glucose No results for input(s): GLUCAP in the last 168 hours.  Imaging No results found.   STUDIES:  CT chest 6/5>>> 1. Right-sided vascular access catheter is intra arterial with tip in the aortic arch. The catheter traverses the right internal  jugular vein and enters the right common carotid artery before continuing inferiorly to the aortic arch. There is surrounding soft tissue edema that extends into the right upper chest wall and axilla.  2. Large volume pneumoperitoneum, consistent with recent perforation and subsequent abdominal surgery. 3.  Aortic Atherosclerosis (ICD10-I70.0).  CULTURES:   ANTIBIOTICS: Ertapenem 6/6>>>   SIGNIFICANT EVENTS: 6/5 OR >> exploratory lap, repair of colonic perforation  6/6 OR>> removal of malpositioned, arterially placed central line   LINES/TUBES: R IJ CVL 6/5>>>6/6  DISCUSSION: 77yo female with hx NASH cirrhosis and multiple recent admissions for anasarca admitted 6/3 with decompensated cirrhosis and hematochezia.  Underwent colonoscopy which was complicated by colonic perforation requiring exploratory lap.  Had R IJ CVL placed for pressor needs which unfortunately crossed the midline and terminated in carotid requiring tx to [facility] for vascular surgery.   ASSESSMENT /  PLAN:  PULMONARY No active issue P:   Pulmonal O2 as needed Pulmonary hygiene Mobilize postop as able Intermittent follow-up chest x-ray  CARDIOVASCULAR Hypotension. I am concerned about potential contributions from both hypovolemia and intra-abdominal infection.  I have added a scheduled dose of albumin for intravascular volume expansion.  I am aware of her at least moderate aortic stenosis revealed on an echocardiogram of 06/30/2017  Malpositioned right IJ CVL into carotid- status post OR removal by vascular surgery There is no focal neurological deficit at present  RENAL AKI-mild Hypokalemia Hypomg P:   Follow-up chemistry Holding diuretics for now  Monitor urine output closely replace electrolytes as needed  GASTROINTESTINAL [name] cirrhosis Ascites Hematochezia - resolved  Perforated colon-  Iatrogenic.  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  To continue to monitor her CBCs both to monitor her hemoglobin and her platelet count in order to ensure we maintain hemostasis in the perioperative period.   I am giving a single dose of vitamin K in response to her elevated INR.   INFECTIOUS Peritonitis-status post colon perforation P:   Continue ertapenem as above Procalcitonin's are being trended.    ENDOCRINE Hypothyroid P:   Continue Synthroid.  TSH has been ordered for the morning.  NEUROLOGIC Mental status-multifactorial post anesthesia and in the setting of liver failure P:   Cautious with postop pain control Mobilize as able Avoid oversedation Follow-up ammonia    FAMILY  -I have discussed the situation with family at bedside this morning    Lars Masson, MD 09/05/2017  9:33 AM Critical Care Medicine

## 2017-09-05 NOTE — Progress Notes (Signed)
[facility] Progress Note Patient Name: JAELY SILMAN DOB: 11-09-1940 MRN: [id number]   Date of Service  09/05/2017  HPI/Events of Note  Acute blood loss anemia with hemoglobin down to 7.0 gm %. Per nursing she has had approximately 100 ml/ Hr of bloody drainage vi her JP drain, hemodynamically stable so far. There may also be a dilutional effect from Albumin fluid bolus.  eICU Interventions  Transfuse 1 unit PRBC, trend H & H.        [place] U Ogan 09/05/2017, 4:30 AM

## 2017-09-05 NOTE — Consult Note (Signed)
[facility] Consult/Admission Note  [name] 17-Aug-1940  [id number].    Requesting MD: Marni Griffon, NP Chief Complaint/Reason for Consult:  S/P partial colectomy  HPI:   Pt is a 77yo female with hx NASH cirrhosis, hypothyroidism, thrombocytopenia with  admission to [facility] 6/3 with anasarca, hematochezia and decompensated cirrhosis.  She underwent colonoscopy 6/5 which was complicated by colonic perforation requiring exploratory lap with partial colectomy by Dr. Arnoldo Morale on 06/05.  Post op she had a central line placed. R IJ CVL crossed the midline and entered R carotid with tip in the aortic arch.  She was transferred on 6/6 to [facility] for vascular surgery for repair of carotid and jugular. We were consulted to assist with post op care. Pt states she is having mild, non radiating, generalized abdominal pain worse with movement. Associated nausea. No vomiting. She is thirsty. She denies fevers or chills. She states she is having flatus. No other new complaints.    ROS:  Review of Systems  Constitutional: Negative for chills, diaphoresis and fever.  HENT: Negative for sore throat.   Respiratory: Negative for cough and shortness of breath.   Cardiovascular: Negative for chest pain.  Gastrointestinal: Positive for abdominal pain and nausea. Negative for blood in stool, constipation, diarrhea and vomiting.  Genitourinary: Negative for dysuria.  Skin: Negative for rash.  Neurological: Negative for dizziness and loss of consciousness.  All other systems reviewed and are negative.    Family History  Problem Relation Age of Onset  . Heart disease Father   . Heart attack Father   . Aneurysm Mother   . Diabetes Sister   . Hypertension Sister   . Heart disease Sister   . Hypertension Brother   . Heart disease Brother   . Colon cancer Neg Hx     Past Medical History:  Diagnosis Date  . Ankle fracture, right 05/21/10  . Anxiety   . Arthritis   . Back  fracture 05/21/10  . Depression   . Fibromyalgia   . GERD (gastroesophageal reflux disease)   . Hypercholesteremia   . 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 Knee fracture, right 05/21/10  . Nonalcoholic steatohepatitis (NASH) BMI > 40   NOV 2013 CHILD PUGH A, MELD SCORE 7. NOV 2017 CHILD PUGH B, MELD 9  . Obesity   . Splenomegaly 09/23/2012  . Thrombocytopenia ([place]) 02/19/2012    Past Surgical History:  Procedure Laterality Date  . ABDOMINAL HYSTERECTOMY    . BREAST BIOPSY  1982   left benign  . CATARACT EXTRACTION W/PHACO Right 07/11/2015   Procedure: CATARACT EXTRACTION PHACO AND INTRAOCULAR LENS PLACEMENT (IOC);  Surgeon: Rutherford Guys, MD;  Location: [facility];  Service: Ophthalmology;  Laterality: Right;  CDE: 6.00  . CATARACT EXTRACTION W/PHACO Left 07/25/2015   Procedure: CATARACT EXTRACTION PHACO AND INTRAOCULAR LENS PLACEMENT (IOC);  Surgeon: Rutherford Guys, MD;  Location: [facility];  Service: Ophthalmology;  Laterality: Left;  CDE:8.58  . COLONOSCOPY  June 2010   Dr. Collene Mares: few scattered sigmoid diverticula, otherwise normal. Screening in 2020   . ESOPHAGOGASTRODUODENOSCOPY  03/02/2012   Dr. Oneida Alar:The mucosa of the esophagus appeared normal Multiple non-bleeding ulcers, ranging between 3-64m in size were found in the gastric antrum  The duodenal mucosa showed no abnormalities in the bulb and second portion of the duodenum. path negative for H.pylori  . ESOPHAGOGASTRODUODENOSCOPY N/A 07/15/2014   SLF: 1. No esophageal, gastric, or duodenal varices 2. small 1-2 mm gastric ulcer.  Negative H.pylori   . ESOPHAGOGASTRODUODENOSCOPY N/A 06/05/2015   SLF: 1. Mild gastritis on ASA and PPI  . LAPAROTOMY N/A 09/03/2017   Procedure: EXPLORATORY LAPAROTOMY;  Surgeon: Aviva Signs, MD;  Location: [facility];  Service: General;  Laterality: N/A;  . PARTIAL COLECTOMY  09/03/2017   Procedure: PARTIAL COLECTOMY;  Surgeon: Aviva Signs, MD;  Location: [facility];  Service: General;;  .  PARTIAL HYSTERECTOMY      Social History:  reports that she has never smoked. She has never used smokeless tobacco. She reports that she does not drink alcohol or use drugs.  Allergies: No Known Allergies  Medications Prior to Admission  Medication Sig Dispense Refill  . aspirin EC 81 MG tablet Take 81 mg by mouth at bedtime.    . Cholecalciferol (VITAMIN D3) 5000 units CAPS Take 1 capsule daily by mouth.    Marland Kitchen FLUoxetine (PROZAC) 20 MG capsule Take 20 mg by mouth daily.  0  . furosemide (LASIX) 80 MG tablet Take 1 tablet (80 mg total) by mouth 2 (two) times daily. 60 tablet 1  . KLOR-CON M20 20 MEQ tablet Take 20 mEq by mouth daily.  3  . levothyroxine (SYNTHROID, LEVOTHROID) 100 MCG tablet Take 100 mcg by mouth daily.  1  . lidocaine-hydrocortisone (ANAMANTEL HC) 3-0.5 % CREA Apply to the external hemorrhoid area 4 times daily as needed 1 Tube 0  . loratadine-pseudoephedrine (CLARITIN-D 24-HOUR) 10-240 MG 24 hr tablet Take 1 tablet by mouth daily as needed for allergies.    . magnesium oxide (MAG-OX) 400 (241.3 Mg) MG tablet Take 1 tablet (400 mg total) by mouth daily. 30 tablet 1  . pantoprazole (PROTONIX) 40 MG tablet Take 1 tablet (40 mg total) by mouth daily. 90 tablet 3  . Propylene Glycol (SYSTANE BALANCE) 0.6 % SOLN Apply 1 drop to eye daily as needed. Dry Eyes    . simvastatin (ZOCOR) 40 MG tablet Take 40 mg by mouth daily.     Marland Kitchen spironolactone (ALDACTONE) 50 MG tablet Take 1 tablet (50 mg total) by mouth 2 (two) times daily. 60 tablet 1    Blood pressure (!) 116/43, pulse 76, temperature 97.9 F (36.6 C), temperature source Oral, resp. rate (!) 32, height 5' 2"  (1.575 m), weight 87.8 kg (193 lb 9 oz), SpO2 93 %.  Physical Exam  Constitutional: She is oriented to person, place, and time. No distress.  Ill appearing  HENT:  Head: Normocephalic and atraumatic.  Nose: Nose normal.  Mouth/Throat: Oropharynx is clear and moist. No oropharyngeal exudate.  Eyes: Pupils are equal,  round, and reactive to light. Conjunctivae are normal. Right eye exhibits no discharge. Left eye exhibits no discharge. No scleral icterus.  Neck: Normal range of motion. No thyromegaly present.  Cardiovascular: Normal rate, regular rhythm and intact distal pulses.  Murmur heard. Pulses:      Radial pulses are 2+ on the right side, and 2+ on the left side.  Pulmonary/Chest: Effort normal and breath sounds normal. No respiratory distress. She has no wheezes. She has no rhonchi.  Abdominal: Soft. Bowel sounds are normal. She exhibits no distension. There is no hepatosplenomegaly. There is generalized tenderness. There is no rigidity and no guarding.  Midline incision with honeycomb dressing in place, drain with serosanguinous drainage  Musculoskeletal: She exhibits edema (moderate edema of BUE and BLE). She exhibits no tenderness or deformity.  Lymphadenopathy:    She has no cervical adenopathy.  Neurological: She is alert and oriented to person, place, and  time.  Skin: Skin is warm and dry. No rash noted. She is not diaphoretic.  Psychiatric: She has a normal mood and affect.  Nursing note and vitals reviewed.   Results for orders placed or performed during the hospital encounter of 09/01/17 (from the past 48 hour(s))  Type and screen [facility]     Status: None (Preliminary result)   Collection Time: 09/03/17  1:37 PM  Result Value Ref Range   ABO/RH(D) O POS    Antibody Screen NEG    Sample Expiration 09/06/2017    Unit Number [id number]    Blood Component Type RED CELLS,LR    Unit division 00    Status of Unit ISS'D ANOTH PT    Transfusion Status OK TO TRANSFUSE    Crossmatch Result      Compatible Performed at [facility], [address], [place], [place] [address]    Unit Number [id number]    Blood Component Type RED CELLS,LR    Unit division 00    Status of Unit ALLOCATED    Transfusion Status OK TO TRANSFUSE    Crossmatch Result Compatible   Prepare  Pheresed Platelets     Status: None   Collection Time: 09/03/17  1:37 PM  Result Value Ref Range   Unit Number [id number]    Blood Component Type PLTPHER LR2    Unit division 00    Status of Unit ISSUED,FINAL    Transfusion Status OK TO TRANSFUSE   Prepare fresh frozen plasma     Status: None   Collection Time: 09/03/17  1:37 PM  Result Value Ref Range   Unit Number [id number]    Blood Component Type THAWED PLASMA    Unit division 00    Status of Unit ISSUED,FINAL    Transfusion Status OK TO TRANSFUSE    Unit Number [id number]    Blood Component Type THAWED PLASMA    Unit division 00    Status of Unit ISSUED,FINAL    Transfusion Status OK TO TRANSFUSE   Prepare RBC (crossmatch)     Status: None   Collection Time: 09/03/17  1:38 PM  Result Value Ref Range   Order Confirmation      ORDER PROCESSED BY BLOOD BANK Performed at [facility], [address], [place], [place] [address]   Hemoglobin and hematocrit, blood     Status: Abnormal   Collection Time: 09/03/17  4:49 PM  Result Value Ref Range   Hemoglobin 9.4 (L) 12.0 - 15.0 g/dL   HCT 27.2 (L) 36.0 - 46.0 %    Comment: Performed at [facility], [address] [place], [place] [address]  MRSA PCR Screening     Status: None   Collection Time: 09/03/17  5:18 PM  Result Value Ref Range   MRSA by PCR NEGATIVE NEGATIVE    Comment:        The GeneXpert MRSA Assay (FDA approved for NASAL specimens only), is one component of a comprehensive MRSA colonization surveillance program. It is not intended to diagnose MRSA infection nor to guide or monitor treatment for MRSA infections. Performed at [facility], [address], [place], [place] [address]   Comprehensive metabolic panel     Status: Abnormal   Collection Time: 09/03/17 10:19 PM  Result Value Ref Range   Sodium 136 135 - 145 mmol/L   Potassium 3.6 3.5 - 5.1 mmol/L   Chloride 103 101 - 111 mmol/L   CO2 24 22 - 32 mmol/L  Glucose, Bld 138 (H) 65 - 99  mg/dL   BUN 20 6 - 20 mg/dL   Creatinine, Ser 1.40 (H) 0.44 - 1.00 mg/dL   Calcium 8.5 (L) 8.9 - 10.3 mg/dL   Total Protein 5.3 (L) 6.5 - 8.1 g/dL   Albumin 2.7 (L) 3.5 - 5.0 g/dL   AST 37 15 - 41 U/L   ALT 23 14 - 54 U/L   Alkaline Phosphatase 84 38 - 126 U/L   Total Bilirubin 1.7 (H) 0.3 - 1.2 mg/dL   GFR calc non Af Amer 35 (L) >60 mL/min   GFR calc Af Amer 41 (L) >60 mL/min    Comment: (NOTE) The eGFR has been calculated using the CKD EPI equation. This calculation has not been validated in all clinical situations. eGFR's persistently <60 mL/min signify possible Chronic Kidney Disease.    Anion gap 9 5 - 15    Comment: Performed at [facility], [address] [place], [place] [address]  Protime-INR     Status: Abnormal   Collection Time: 09/03/17 10:19 PM  Result Value Ref Range   Prothrombin Time 19.4 (H) 11.4 - 15.2 seconds   INR 1.65     Comment: Performed at [facility], [address] [place], [place] [address]  APTT     Status: None   Collection Time: 09/03/17 10:19 PM  Result Value Ref Range   aPTT 36 24 - 36 seconds    Comment: Performed at [facility], [address] [place], [place] [address]  CBC     Status: Abnormal   Collection Time: 09/03/17 10:19 PM  Result Value Ref Range   WBC 10.8 (H) 4.0 - 10.5 K/uL   RBC 2.31 (L) 3.87 - 5.11 MIL/uL   Hemoglobin 8.1 (L) 12.0 - 15.0 g/dL   HCT 24.2 (L) 36.0 - 46.0 %   MCV 104.8 (H) 78.0 - 100.0 fL   MCH 35.1 (H) 26.0 - 34.0 pg   MCHC 33.5 30.0 - 36.0 g/dL   RDW 13.3 11.5 - 15.5 %   Platelets 129 (L) 150 - 400 K/uL    Comment: POST TRANSFUSION SPECIMEN DELTA CHECK NOTED Performed at [facility] [address] [place], [place] [address]   Fibrinogen     Status: Abnormal   Collection Time: 09/03/17 10:19 PM  Result Value Ref Range   Fibrinogen 184 (L) 210 - 475 mg/dL    Comment: Performed at [facility], [address] [place], [place] [address]  Comprehensive metabolic panel     Status: Abnormal    Collection Time: 09/04/17  4:33 AM  Result Value Ref Range   Sodium 135 135 - 145 mmol/L   Potassium 3.8 3.5 - 5.1 mmol/L   Chloride 103 101 - 111 mmol/L   CO2 25 22 - 32 mmol/L   Glucose, Bld 160 (H) 65 - 99 mg/dL   BUN 21 (H) 6 - 20 mg/dL   Creatinine, Ser 1.28 (H) 0.44 - 1.00 mg/dL   Calcium 8.4 (L) 8.9 - 10.3 mg/dL   Total Protein 5.1 (L) 6.5 - 8.1 g/dL   Albumin 2.7 (L) 3.5 - 5.0 g/dL   AST 33 15 - 41 U/L   ALT 21 14 - 54 U/L   Alkaline Phosphatase 81 38 - 126 U/L   Total Bilirubin 1.6 (H) 0.3 - 1.2 mg/dL   GFR calc non Af Amer 40 (L) >60 mL/min   GFR calc Af Amer 46 (L) >60 mL/min    Comment: (  NOTE) The eGFR has been calculated using the CKD EPI equation. This calculation has not been validated in all clinical situations. eGFR's persistently <60 mL/min signify possible Chronic Kidney Disease.    Anion gap 7 5 - 15    Comment: Performed at [facility], [address] [place], [place] [address]  Magnesium     Status: Abnormal   Collection Time: 09/04/17  4:33 AM  Result Value Ref Range   Magnesium 1.3 (L) 1.7 - 2.4 mg/dL    Comment: Performed at [facility], [address], [place], [place] [address]  Phosphorus     Status: None   Collection Time: 09/04/17  4:33 AM  Result Value Ref Range   Phosphorus 3.3 2.5 - 4.6 mg/dL    Comment: Performed at [facility], [address] [place], [place] [address]  CBC     Status: Abnormal   Collection Time: 09/04/17  4:33 AM  Result Value Ref Range   WBC 12.7 (H) 4.0 - 10.5 K/uL   RBC 2.35 (L) 3.87 - 5.11 MIL/uL   Hemoglobin 8.3 (L) 12.0 - 15.0 g/dL   HCT 24.5 (L) 36.0 - 46.0 %   MCV 104.3 (H) 78.0 - 100.0 fL   MCH 35.3 (H) 26.0 - 34.0 pg   MCHC 33.9 30.0 - 36.0 g/dL   RDW 13.2 11.5 - 15.5 %   Platelets 127 (L) 150 - 400 K/uL    Comment: Performed at [facility], [address] [place], [place] [address]  Protime-INR     Status: Abnormal   Collection Time: 09/04/17  4:33 AM  Result Value Ref Range   Prothrombin Time 19.1  (H) 11.4 - 15.2 seconds   INR 1.62     Comment: Performed at [facility], [address] [place], [place] [address]  APTT     Status: Abnormal   Collection Time: 09/04/17  4:33 AM  Result Value Ref Range   aPTT 37 (H) 24 - 36 seconds    Comment:        IF BASELINE aPTT IS ELEVATED, SUGGEST PATIENT RISK ASSESSMENT BE USED TO DETERMINE APPROPRIATE ANTICOAGULANT THERAPY. Performed at [facility], [address] [place], [place] [address]   Lactate dehydrogenase     Status: None   Collection Time: 09/04/17  4:33 AM  Result Value Ref Range   LDH 149 98 - 192 U/L    Comment: Performed at [facility], [address], [place], [place] [address]  Save smear     Status: None   Collection Time: 09/04/17  4:33 AM  Result Value Ref Range   Smear Review SMEAR STAINED AND AVAILABLE FOR REVIEW     Comment: Performed at [facility], [address], [place],  [address]  Surgical pcr screen     Status: None   Collection Time: 09/04/17 11:29 AM  Result Value Ref Range   MRSA, PCR NEGATIVE NEGATIVE   Staphylococcus aureus NEGATIVE NEGATIVE    Comment: (NOTE) The Xpert SA Assay (FDA approved for NASAL specimens in patients 58 years of age and older), is one component of a comprehensive surveillance program. It is not intended to diagnose infection nor to guide or monitor treatment. Performed at [facility], [place] [address] [place],  [address]   Type and screen All Cardiac and thoracic surgeries, spinal fusions, myomectomies, craniotomies, colon & liver resections, total joint revisions, same day c-section with placenta previa or accreta.     Status: None (Preliminary result)  Collection Time: 09/04/17 12:12 PM  Result Value Ref Range   ABO/RH(D) O POS    Antibody Screen NEG    Sample Expiration 09/07/2017    Unit Number [id number]    Blood Component Type RED CELLS,LR    Unit division 00    Status of Unit REL FROM [facility]    Unit tag comment VERBAL ORDERS PER DR  [facility]    Transfusion Status OK TO TRANSFUSE    Crossmatch Result COMPATIBLE    Unit Number [id number]    Blood Component Type RED CELLS,LR    Unit division 00    Status of Unit REL FROM [facility]    Unit tag comment VERBAL ORDERS PER DR FITZGERALD    Transfusion Status OK TO TRANSFUSE    Crossmatch Result COMPATIBLE    Unit Number [id number]    Blood Component Type RED CELLS,LR    Unit division 00    Status of Unit ISSUED    Transfusion Status OK TO TRANSFUSE    Crossmatch Result      Compatible Performed at [facility], [place] [address] [place], [place] [address]   ABO/Rh     Status: None   Collection Time: 09/04/17 12:12 PM  Result Value Ref Range   ABO/RH(D)      O POS Performed at [place] [address], [place], [place] [address]   Prepare RBC (crossmatch)     Status: None   Collection Time: 09/04/17 12:19 PM  Result Value Ref Range   Order Confirmation      ORDER PROCESSED BY BLOOD BANK Performed at [facility], [place] [address], [place], [place] [address]   Lactic acid, plasma     Status: None   Collection Time: 09/04/17  4:25 PM  Result Value Ref Range   Lactic Acid, Venous 1.9 0.5 - 1.9 mmol/L    Comment: Performed at [facility] [address], [place], [place] [address]  Lactic acid, plasma     Status: None   Collection Time: 09/04/17  4:25 PM  Result Value Ref Range   Lactic Acid, Venous 1.8 0.5 - 1.9 mmol/L    Comment: Performed at [facility] [address] [place],  [address]  Procalcitonin     Status: None   Collection Time: 09/04/17  8:03 PM  Result Value Ref Range   Procalcitonin 5.40 ng/mL    Comment:        Interpretation: PCT > 2 ng/mL: Systemic infection (sepsis) is likely, unless other causes are known. (NOTE)       Sepsis PCT Algorithm           Lower Respiratory Tract                                      Infection PCT Algorithm    ----------------------------      ----------------------------         PCT < 0.25 ng/mL                PCT < 0.10 ng/mL         Strongly encourage             Strongly discourage   discontinuation of antibiotics    initiation of antibiotics    ----------------------------     -----------------------------       PCT 0.25 - 0.50  ng/mL            PCT 0.10 - 0.25 ng/mL               OR       >80% decrease in PCT            Discourage initiation of                                            antibiotics      Encourage discontinuation           of antibiotics    ----------------------------     -----------------------------         PCT >= 0.50 ng/mL              PCT 0.26 - 0.50 ng/mL               AND       <80% decrease in PCT              Encourage initiation of                                             antibiotics       Encourage continuation           of antibiotics    ----------------------------     -----------------------------        PCT >= 0.50 ng/mL                  PCT > 0.50 ng/mL               AND         increase in PCT                  Strongly encourage                                      initiation of antibiotics    Strongly encourage escalation           of antibiotics                                     -----------------------------                                           PCT <= 0.25 ng/mL                                                 OR                                        > 80% decrease in PCT  Discontinue / Do not initiate                                             antibiotics Performed at [facility], [place] [address], [place], [place] [address]   Comprehensive metabolic panel     Status: Abnormal   Collection Time: 09/05/17  3:57 AM  Result Value Ref Range   Sodium 135 135 - 145 mmol/L   Potassium 4.0 3.5 - 5.1 mmol/L   Chloride 105 101 - 111 mmol/L   CO2 24 22 - 32 mmol/L   Glucose, Bld 134 (H) 65 - 99 mg/dL   BUN 18 6 - 20 mg/dL    Creatinine, Ser 1.29 (H) 0.44 - 1.00 mg/dL   Calcium 8.3 (L) 8.9 - 10.3 mg/dL   Total Protein 4.9 (L) 6.5 - 8.1 g/dL   Albumin 2.7 (L) 3.5 - 5.0 g/dL   AST 23 15 - 41 U/L   ALT 17 14 - 54 U/L   Alkaline Phosphatase 63 38 - 126 U/L   Total Bilirubin 1.7 (H) 0.3 - 1.2 mg/dL   GFR calc non Af Amer 39 (L) >60 mL/min   GFR calc Af Amer 45 (L) >60 mL/min    Comment: (NOTE) The eGFR has been calculated using the CKD EPI equation. This calculation has not been validated in all clinical situations. eGFR's persistently <60 mL/min signify possible Chronic Kidney Disease.    Anion gap 6 5 - 15    Comment: Performed at [facility] [address], [place], [place] [address]  CBC     Status: Abnormal   Collection Time: 09/05/17  3:57 AM  Result Value Ref Range   WBC 8.4 4.0 - 10.5 K/uL   RBC 2.01 (L) 3.87 - 5.11 MIL/uL   Hemoglobin 7.0 (L) 12.0 - 15.0 g/dL   HCT 20.8 (L) 36.0 - 46.0 %   MCV 103.5 (H) 78.0 - 100.0 fL   MCH 34.8 (H) 26.0 - 34.0 pg   MCHC 33.7 30.0 - 36.0 g/dL   RDW 13.2 11.5 - 15.5 %   Platelets 75 (L) 150 - 400 K/uL    Comment: SPECIMEN CHECKED FOR CLOTS REPEATED TO VERIFY PLATELET COUNT CONFIRMED BY SMEAR Performed at [facility], [place] [address], [place], [place] [address]   Procalcitonin     Status: None   Collection Time: 09/05/17  3:57 AM  Result Value Ref Range   Procalcitonin 4.70 ng/mL    Comment:        Interpretation: PCT > 2 ng/mL: Systemic infection (sepsis) is likely, unless other causes are known. (NOTE)       Sepsis PCT Algorithm           Lower Respiratory Tract                                      Infection PCT Algorithm    ----------------------------     ----------------------------         PCT < 0.25 ng/mL                PCT < 0.10 ng/mL         Strongly encourage             Strongly discourage  discontinuation of antibiotics    initiation of antibiotics    ----------------------------     -----------------------------       PCT  0.25 - 0.50 ng/mL            PCT 0.10 - 0.25 ng/mL               OR       >80% decrease in PCT            Discourage initiation of                                            antibiotics      Encourage discontinuation           of antibiotics    ----------------------------     -----------------------------         PCT >= 0.50 ng/mL              PCT 0.26 - 0.50 ng/mL               AND       <80% decrease in PCT              Encourage initiation of                                             antibiotics       Encourage continuation           of antibiotics    ----------------------------     -----------------------------        PCT >= 0.50 ng/mL                  PCT > 0.50 ng/mL               AND         increase in PCT                  Strongly encourage                                      initiation of antibiotics    Strongly encourage escalation           of antibiotics                                     -----------------------------                                           PCT <= 0.25 ng/mL                                                 OR                                        >  80% decrease in PCT                                     Discontinue / Do not initiate                                             antibiotics Performed at [facility], [place] [address] [place], [place] [address]   Prepare RBC     Status: None   Collection Time: 09/05/17  4:41 AM  Result Value Ref Range   Order Confirmation      ORDER PROCESSED BY BLOOD BANK Performed at [facility], [place] [address] [place], [place] [address]   Provider-confirm verbal Blood Bank order - RBC; 2 Units; Order taken: 09/04/2017; 12:15 PM; Emergency Release, STAT, Surgery 2 units of O negative red cells emergency released to the OR @ 1231. All units returned @ 1425.     Status: None   Collection Time: 09/05/17  7:37 AM  Result Value Ref Range   Blood product order confirm      MD AUTHORIZATION  REQUESTED Performed at [place] [address], [place], [place] [address]   Hemoglobin and hematocrit, blood     Status: Abnormal   Collection Time: 09/05/17 11:20 AM  Result Value Ref Range   Hemoglobin 8.8 (L) 12.0 - 15.0 g/dL    Comment: DELTA CHECK NOTED POST TRANSFUSION SPECIMEN    HCT 25.9 (L) 36.0 - 46.0 %    Comment: Performed at [facility] [address] [place], [place] [address]   Ct Chest W Contrast  Result Date: 09/03/2017 CLINICAL DATA:  Central line placement. EXAM: CT CHEST WITH CONTRAST TECHNIQUE: Multidetector CT imaging of the chest was performed during intravenous contrast administration. CONTRAST:  75 mL ISOVUE-300 IOPAMIDOL (ISOVUE-300) INJECTION 61% COMPARISON:  Chest radiograph 09/03/2017 FINDINGS: Cardiovascular: Right-sided vascular catheter traverses the right internal jugular vein and enters the right common carotid artery, with tip in the proximal aortic arch. There is atherosclerotic calcification in the aorta. Mild cardiomegaly. There is soft tissue edema in the right neck, extending into the upper right chest wall and axilla. Mediastinum/Nodes: No mediastinal, hilar or axillary lymphadenopathy. The visualized thyroid and thoracic esophageal course are unremarkable. Lungs/Pleura: Lungs are clear. No pleural effusion or pneumothorax. Upper Abdomen: There is a large amount of free air in the anterior abdomen. The liver is diffusely nodular. Small volume ascites. Musculoskeletal: No chest wall abnormality. No acute or significant osseous findings. IMPRESSION: 1. Right-sided vascular access catheter is intra arterial with tip in the aortic arch. The catheter traverses the right internal jugular vein and enters the right common carotid artery before continuing inferiorly to the aortic arch. There is surrounding soft tissue edema that extends into the right upper chest wall and axilla. 2. Large volume pneumoperitoneum, consistent with recent perforation  and subsequent abdominal surgery. 3.  Aortic Atherosclerosis (ICD10-I70.0). Critical Value/emergent results were called by telephone at the time of interpretation on 09/03/2017 at 10:35 pm to Dr. Aviva Signs , who verbally acknowledged these results. Electronically Signed   By: Ulyses Jarred M.D.   On: 09/03/2017 22:35   Dg Chest Port 1 View  Result Date: 09/03/2017 CLINICAL DATA:  Central line placement. EXAM: PORTABLE CHEST 1  VIEW COMPARISON:  Radiograph of September 01, 2017. FINDINGS: The heart size and mediastinal contours are within normal limits. Both lungs are clear. Hypoinflation of the lungs is noted. No pneumothorax or pleural effusion is noted. Interval placement of right-sided neck catheter. The tip is positioned more medially than expected and is concerning for possible arterial puncture. The visualized skeletal structures are unremarkable. IMPRESSION: Hypoinflation of the lungs. Interval placement of right-sided neck catheter. The distal tip is significantly more medial than would be expected for superior vena cava and internal jugular venous access. The possibility of inadvertent arterial catheterization with tip in aorta cannot be excluded. Clinical correlation is recommended as well as further evaluation with CT scan. These results will be called to the ordering clinician or representative by the Radiologist Assistant, and communication documented in the PACS or zVision Dashboard. Electronically Signed   By: Marijo Conception, M.D.   On: 09/03/2017 18:03      Assessment/Plan Principal Problem:   Anasarca Active Problems:   Hyperlipidemia   Thrombocytopenia (HCC)   PUD (peptic ulcer disease)   Liver cirrhosis secondary to NASH (HCC)   Hypothyroidism   Cirrhosis of liver with ascites (HCC)   Ascites   Rectal bleeding   S/P partial colectomy   Encounter for central line placement   Palliative care by specialist   Goals of care, counseling/discussion   DNR (do not resuscitate)  discussion   Hypovolemic shock ([place])  S/P exploratory laparotomy, partial colectomy, Dr. Arnoldo Morale, 06/05 - leave honeycomb dressing on until 06/10 - + flatus, no BM  FEN: okay for CLD from a surgical standpoint VTE: per CCM ID: Invanz 06/06 Foley: yes Follow up: Dr. Arnoldo Morale  Plan: We will follow along. Okay for clear liquids. Do not advance diet until bowel movements.   Kalman Drape, [facility] Surgery 09/05/2017, 11:54 AM Pager: [phone] Consults: [phone] Mon-Fri 7:00 am-4:30 pm Sat-Sun 7:00 am-11:30 am

## 2017-09-05 NOTE — Progress Notes (Signed)
   Daily Progress Note   Assessment/Planning:   POD #1 s/p Repair of RIJV and R CCA   No hematoma in R neck  Keep TLS drain one more day.  Likely D/C tomorrow   Subjective  - 1 Day Post-Op   Sleeping, events overnight noted   Objective   Vitals:   09/05/17 0600 09/05/17 0615 09/05/17 0630 09/05/17 0645  BP: (!) 100/38     Pulse: 83 82 81 79  Resp: (!) 22 17 17 19   Temp:      TempSrc:      SpO2: 95% 95% 95% 95%  Weight:      Height:         Intake/Output Summary (Last 24 hours) at 09/05/2017 0722 Last data filed at 09/05/2017 0600 Gross per 24 hour  Intake 3474.27 ml  Output 3025 ml  Net 449.27 ml   NECK: R neck without hematoma, inc c/d/i, TLS minimal in test tube, ~15 cc serosang drainage initially   Laboratory   CBC CBC Latest Ref Rng & Units 09/05/2017 09/04/2017 09/03/2017  WBC 4.0 - 10.5 K/uL 8.4 12.7(H) 10.8(H)  Hemoglobin 12.0 - 15.0 g/dL 7.0(L) 8.3(L) 8.1(L)  Hematocrit 36.0 - 46.0 % 20.8(L) 24.5(L) 24.2(L)  Platelets 150 - 400 K/uL 75(L) 127(L) 129(L)    BMET    Component Value Date/Time   NA 135 09/05/2017 0357   K 4.0 09/05/2017 0357   CL 105 09/05/2017 0357   CO2 24 09/05/2017 0357   GLUCOSE 134 (H) 09/05/2017 0357   BUN 18 09/05/2017 0357   CREATININE 1.29 (H) 09/05/2017 0357   CREATININE 0.79 02/26/2017 1407   CALCIUM 8.3 (L) 09/05/2017 0357   GFRNONAA 39 (L) 09/05/2017 0357   GFRNONAA 83 05/12/2015 0905   GFRAA 45 (L) 09/05/2017 0357   GFRAA >89 05/12/2015 5859     Adele Barthel, MD, FACS [facility] [place] Office: [phone] Pager: [phone]  09/05/2017, 7:22 AM

## 2017-09-05 NOTE — Progress Notes (Signed)
JP bulb switched to gravity bag per general surgery. Drained 566mL in <1 hour. VSS, pt remains on 59mcg of Levophed without needing titrations. Spoke with Dr. Ninfa Linden and ordered to let the bag drain, it is a normal finding given her NASH cirrhosis and ascites.

## 2017-09-06 LAB — MAGNESIUM: MAGNESIUM: 1.9 mg/dL (ref 1.7–2.4)

## 2017-09-06 LAB — COMPREHENSIVE METABOLIC PANEL
ALBUMIN: 3.1 g/dL — AB (ref 3.5–5.0)
ALT: 13 U/L — ABNORMAL LOW (ref 14–54)
ANION GAP: 3 — AB (ref 5–15)
AST: 18 U/L (ref 15–41)
Alkaline Phosphatase: 55 U/L (ref 38–126)
BUN: 17 mg/dL (ref 6–20)
CHLORIDE: 108 mmol/L (ref 101–111)
CO2: 25 mmol/L (ref 22–32)
Calcium: 8.5 mg/dL — ABNORMAL LOW (ref 8.9–10.3)
Creatinine, Ser: 1.02 mg/dL — ABNORMAL HIGH (ref 0.44–1.00)
GFR calc Af Amer: >60 mL/min (ref >60)
GFR calc non Af Amer: 52 mL/min — ABNORMAL LOW (ref >60)
GLUCOSE: 129 mg/dL — AB (ref 65–99)
POTASSIUM: 3.7 mmol/L (ref 3.5–5.1)
SODIUM: 136 mmol/L (ref 135–145)
Total Bilirubin: 2.3 mg/dL — ABNORMAL HIGH (ref 0.3–1.2)
Total Protein: 5.1 g/dL — ABNORMAL LOW (ref 6.5–8.1)

## 2017-09-06 LAB — BPAM RBC
Blood Product Expiration Date: 201906302359
Blood Product Expiration Date: 201907012359
Blood Product Expiration Date: 201907062359
ISSUE DATE / TIME: 201906062217
ISSUE DATE / TIME: 201906062217
ISSUE DATE / TIME: 201906070507
Unit Type and Rh: 5100
Unit Type and Rh: 9500
Unit Type and Rh: 9500

## 2017-09-06 LAB — TYPE AND SCREEN
ABO/RH(D): O POS
Antibody Screen: NEGATIVE
UNIT DIVISION: 0
Unit division: 0
Unit division: 0

## 2017-09-06 LAB — CBC WITH DIFFERENTIAL/PLATELET
ABS IMMATURE GRANULOCYTES: 0 10*3/uL (ref 0.0–0.1)
Basophils Absolute: 0 10*3/uL (ref 0.0–0.1)
Basophils Relative: 0 %
EOS PCT: 1 %
Eosinophils Absolute: 0.1 10*3/uL (ref 0.0–0.7)
HEMATOCRIT: 25.9 % — AB (ref 36.0–46.0)
Hemoglobin: 8.7 g/dL — ABNORMAL LOW (ref 12.0–15.0)
Immature Granulocytes: 0 %
LYMPHS ABS: 1.2 10*3/uL (ref 0.7–4.0)
Lymphocytes Relative: 18 %
MCH: 34.5 pg — AB (ref 26.0–34.0)
MCHC: 33.6 g/dL (ref 30.0–36.0)
MCV: 102.8 fL — ABNORMAL HIGH (ref 78.0–100.0)
MONO ABS: 0.5 10*3/uL (ref 0.1–1.0)
Monocytes Relative: 7 %
NEUTROS ABS: 5 10*3/uL (ref 1.7–7.7)
Neutrophils Relative %: 74 %
Platelets: 64 10*3/uL — ABNORMAL LOW (ref 150–400)
RBC: 2.52 MIL/uL — ABNORMAL LOW (ref 3.87–5.11)
RDW: 14.5 % (ref 11.5–15.5)
WBC: 6.8 10*3/uL (ref 4.0–10.5)

## 2017-09-06 LAB — PROCALCITONIN: PROCALCITONIN: 2.69 ng/mL

## 2017-09-06 LAB — TSH: TSH: 2.833 u[IU]/mL (ref 0.350–4.500)

## 2017-09-06 MED ORDER — LACTATED RINGERS IV SOLN
INTRAVENOUS | Status: DC
  Administered 2017-09-06 – 2017-09-08 (×4): via INTRAVENOUS

## 2017-09-06 MED ORDER — HYDROMORPHONE HCL 1 MG/ML IJ SOLN
1.0000 mg | INTRAMUSCULAR | Status: DC | PRN
  Administered 2017-09-06: 0.5 mg via INTRAVENOUS
  Filled 2017-09-06: qty 1

## 2017-09-06 MED ORDER — SODIUM CHLORIDE 0.9 % IV SOLN
1.0000 g | INTRAVENOUS | Status: DC
  Administered 2017-09-06 – 2017-09-08 (×3): 1 g via INTRAVENOUS
  Filled 2017-09-06 (×4): qty 1

## 2017-09-06 MED ORDER — NOREPINEPHRINE 16 MG/250ML-% IV SOLN
0.0000 ug/min | INTRAVENOUS | Status: DC
  Administered 2017-09-06: 12 ug/min via INTRAVENOUS
  Administered 2017-09-06: 18 ug/min via INTRAVENOUS
  Administered 2017-09-07: 11 ug/min via INTRAVENOUS
  Filled 2017-09-06 (×4): qty 250

## 2017-09-06 NOTE — Progress Notes (Signed)
PULMONARY / CRITICAL CARE MEDICINE   Name: Melanie Porter MRN: [id number] DOB: [birth year]    ADMISSION DATE:  09/01/2017 CONSULTATION DATE:  6/6  REFERRING MD:  Tat Deneise Lever [place])   CHIEF COMPLAINT:  Peritonitis, malpositioned R IJ   HISTORY OF PRESENT ILLNESS:   77yo female with hx NASH cirrhosis, hypothyroidism, thrombocytopenia with recent admission 3/31-4/3 and again 4/24-4/30 for decompensated cirrhosis and was diureses and had multiple paracentesis. She returned to [facility] 6/3 again with anasarca, hematochezia and decompensated cirrhosis.  She underwent colonoscopy 6/5 which was complicated by colonic perforation requiring exploratory lap with partial colectomy.  Post op she had difficulties with volume status and labile BP requiring vasopressors and central line was placed.  Unfortunately CT of the chest revealed that R IJ CVL crossed the midline and entered R carotid with tip in the aortic arch.  She was tx 6/6 to [facility] for vascular surgery and PCCM consulted for ICU admission/medical management.  He is more alert for me today.  She is complaining of abdominal pain telling me that the initial dose of analgesic is adequate but wears off before the next dose is received.  She has not had any stool or flatus.  She is not having any shortness of breath.  She is still on a modest dose of levo fed.  This is post laparotomy day 5.  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  PAST SURGICAL HISTORY: She  has a past surgical history that includes Breast biopsy (1982); Partial hysterectomy;  Esophagogastroduodenoscopy (03/02/2012); Esophagogastroduodenoscopy (N/A, 07/15/2014); Abdominal hysterectomy; Colonoscopy (June 2010); Esophagogastroduodenoscopy (N/A, 06/05/2015); Cataract extraction w/PHACO (Right, 07/11/2015); Cataract extraction w/PHACO (Left, 07/25/2015); laparotomy (N/A, 09/03/2017); Partial colectomy (09/03/2017); Colonoscopy (N/A, 09/03/2017); and Esophagogastroduodenoscopy (N/A, 09/03/2017).  No Known Allergies  No current facility-administered medications on file prior to encounter.    Current Outpatient Medications on File Prior to Encounter  Medication Sig  . aspirin EC 81 MG tablet Take 81 mg by mouth at bedtime.  . Cholecalciferol (VITAMIN D3) 5000 units CAPS Take 1 capsule daily by mouth.  Marland Kitchen FLUoxetine (PROZAC) 20 MG capsule Take 20 mg by mouth daily.  . furosemide (LASIX) 80 MG tablet Take 1 tablet (80 mg total) by mouth 2 (two) times daily.  Marland Kitchen KLOR-CON M20 20 MEQ tablet Take 20 mEq by mouth daily.  Marland Kitchen levothyroxine (SYNTHROID, LEVOTHROID) 100 MCG tablet Take 100 mcg by mouth daily.  Marland Kitchen lidocaine-hydrocortisone (ANAMANTEL HC) 3-0.5 % CREA Apply to the external hemorrhoid area 4 times daily as needed  . loratadine-pseudoephedrine (CLARITIN-D 24-HOUR) 10-240 MG 24 hr tablet Take 1 tablet by mouth daily as needed for allergies.  . magnesium oxide (MAG-OX) 400 (241.3 Mg) MG tablet Take 1 tablet (400 mg total) by mouth daily.  . pantoprazole (PROTONIX) 40 MG tablet Take 1 tablet (40 mg total) by mouth daily.  Marland Kitchen Propylene Glycol (SYSTANE BALANCE) 0.6 % SOLN Apply 1 drop to eye daily as needed. Dry Eyes  . simvastatin (ZOCOR) 40 MG tablet Take 40 mg by mouth daily.   Marland Kitchen spironolactone (ALDACTONE) 50 MG tablet Take 1 tablet (50 mg total) by mouth 2 (two) times daily.    FAMILY HISTORY:  Her indicated that her mother is deceased.  She indicated that her father is deceased. She indicated that the status of her sister is unknown. She indicated that the status of her brother is unknown.  She indicated that the status of her neg hx is unknown.   SOCIAL HISTORY: She  reports that she has never smoked. She has never used smokeless tobacco. She reports that she does not drink alcohol or use drugs.  REVIEW OF SYSTEMS:   Review of records shows an echocardiogram performed on 4/1 which shows moderate aortic stenosis and normal LV systolic function.  SUBJECTIVE:    VITAL SIGNS: BP (!) 123/48   Pulse 79   Temp 98.1 F (36.7 C) (Oral)   Resp 15   Ht 5\' 2"  (1.575 m)   Wt 193 lb 9 oz (87.8 kg)   SpO2 92%   BMI 35.40 kg/m   HEMODYNAMICS:    VENTILATOR SETTINGS:    INTAKE / OUTPUT: I/O last 3 completed shifts: In: 5930.3 [P.O.:120; I.V.:4167.8; Blood:442.5; IV EUMPNTIRW:4315] Out: 4008 [Urine:2070; Drains:4155]  PHYSICAL EXAMINATION: General: She is more alert today and appropriately interactive.     Neuro: She responds quickly and appropriately to questions and can tell me the correct day today.  She is moving all fours on request.  Pupils are equal and face is symmetric.   ENT: There is no JVD  Cardiovascular: S1 and S2 are regular with a 4 out of 6 systolic ejection murmur  Lungs: Respirations are unlabored, there is symmetric air movement, no wheezes, no rhonchi, the lungs are clear anteriorly   Abdomen: The abdomen is obese and soft with diffuse slight tenderness and no guarding or rebound.  Few bowel sounds are present.  The JP is draining significant quantities of serous fluid.     Musculoskeletal: Warm and dry, 1+ BLE edema   LABS:  BMET Recent Labs  Lab 09/04/17 0433 09/05/17 0357 09/06/17 0352  NA 135 135 136  K 3.8 4.0 3.7  CL 103 105 108  CO2 25 24 25   BUN 21* 18 17  CREATININE 1.28* 1.29* 1.02*  GLUCOSE 160* 134* 129*    Electrolytes Recent Labs  Lab 09/01/17 1120  09/04/17 0433 09/05/17 0357 09/06/17 0352  CALCIUM  --    < > 8.4* 8.3* 8.5*  MG 1.6*  --  1.3*  --  1.9  PHOS 3.1  --  3.3  --   --    < > = values in this interval  not displayed.    CBC Recent Labs  Lab 09/04/17 0433 09/05/17 0357 09/05/17 1120 09/06/17 0352  WBC 12.7* 8.4  --  6.8  HGB 8.3* 7.0* 8.8* 8.7*  HCT 24.5* 20.8* 25.9* 25.9*  PLT 127* 75*  --  64*    Coag's Recent Labs  Lab 09/03/17 0438 09/03/17 2219 09/04/17 0433  APTT  --  36 37*  INR 1.80 1.65 1.62    Sepsis Markers Recent Labs  Lab 09/04/17 1625  09/05/17 0357 09/05/17 1000 09/06/17 0352  LATICACIDVEN 1.8  1.9  --   --   --   --   PROCALCITON  --    < > 4.70 4.05 2.69   < > = values in this interval not displayed.    ABG No results for input(s): PHART, PCO2ART, PO2ART in the last 168 hours.  Liver Enzymes Recent Labs  Lab 09/04/17 0433 09/05/17 0357 09/06/17 0352  AST 33 23 18  ALT 21 17 13*  ALKPHOS 81 63 55  BILITOT 1.6* 1.7*  2.3*  ALBUMIN 2.7* 2.7* 3.1*    Cardiac Enzymes No results for input(s): TROPONINI, PROBNP in the last 168 hours.  Glucose No results for input(s): GLUCAP in the last 168 hours.  Imaging No results found.   STUDIES:  CT chest 6/5>>> 1. Right-sided vascular access catheter is intra arterial with tip in the aortic arch. The catheter traverses the right internal jugular vein and enters the right common carotid artery before continuing inferiorly to the aortic arch. There is surrounding soft tissue edema that extends into the right upper chest wall and axilla.  2. Large volume pneumoperitoneum, consistent with recent perforation and subsequent abdominal surgery. 3.  Aortic Atherosclerosis (ICD10-I70.0).  CULTURES:   ANTIBIOTICS: Ertapenem 6/6>>>   SIGNIFICANT EVENTS: 6/5 OR >> exploratory lap, repair of colonic perforation  6/6 OR>> removal of malpositioned, arterially placed central line   LINES/TUBES: R IJ CVL 6/5>>>6/6  DISCUSSION: 77yo female with hx NASH cirrhosis and multiple recent admissions for anasarca admitted 6/3 with decompensated cirrhosis and hematochezia.  Underwent colonoscopy which was  complicated by colonic perforation requiring exploratory lap.  Had R IJ CVL placed for pressor needs which unfortunately crossed the midline and terminated in carotid requiring tx to [facility] for vascular surgery.   ASSESSMENT / PLAN:  PULMONARY No active issue P:   Pulmonal O2 as needed Pulmonary hygiene I am adjusting her medications for pain today and encouraging mobilization as a pulmonary toilet efforts.    CARDIOVASCULAR Hypotension. She does not have a left shift and her procalcitonin is dropping, I am encouraged that the component secondary to intra-abdominal sepsis is likely coming under control.  Unfortunately she has a JP in place and we are persistently draining ascites and I will have to be careful to match her eyes and nose to maintain her blood pressure.  She continues on a scheduled dose of albumin. I am aware of her at least moderate aortic stenosis revealed on an echocardiogram of 06/30/2017  Malpositioned right IJ CVL into carotid- status post OR removal by vascular surgery There is no focal neurological deficit at present  RENAL AKI-mild Hypokalemia Hypomg P:   Follow-up chemistry Holding diuretics for now  Monitor urine output closely replace electrolytes as needed  GASTROINTESTINAL [name] cirrhosis Ascites Hematochezia - resolved  Perforated colon-  Iatrogenic.  S/P partial colectomy 6/5 Peritonitis P:   Continue ertapenem Holding diuretics for now as above Trend LFTs, ammonia Holding off on further paracentesis for now  HEMATOLOGIC Thrombocytopenia-secondary to cirrhosis P: She has responded appropriately to transfusion and does not appear to have ongoing blood loss at present.     INFECTIOUS Peritonitis-status post colon perforation P:   Continue ertapenem as above Procalcitonin is trending down and there is no left shift.  ENDOCRINE Hypothyroid P:   Continue Synthroid.  TSH has been ordered for the morning.  NEUROLOGIC Mental  status-multifactorial post anesthesia and in the setting of liver failure P:   Mental status is much improved with control of her intra-abdominal sepsis     Lars Masson, MD 09/06/2017  8:09 AM Critical Care Medicine

## 2017-09-06 NOTE — Progress Notes (Signed)
Patient ID: Melanie Porter, female   DOB: [birth year], 77 y.o.   MRN: [id number]    2 Days Post-Op  Subjective: Pt is passing flatus.  Denies nausea.  No BM.  Still on pressors.  Objective: Vital signs in last 24 hours: Temp:  [97.5 F (36.4 C)-98.1 F (36.7 C)] 98.1 F (36.7 C) (06/08 0734) Pulse Rate:  [68-94] 79 (06/08 0600) Resp:  [11-33] 15 (06/08 0600) BP: (98-126)/(36-53) 123/48 (06/08 0600) SpO2:  [91 %-100 %] 92 % (06/08 0600) Arterial Line BP: (87-133)/(26-67) 115/34 (06/08 0600) Last BM Date: 09/03/17  Intake/Output from previous day: 06/07 0701 - 06/08 0700 In: 4004.1 [P.O.:120; I.V.:2841.6; Blood:442.5; IV [id number]:756] Out: 4332 [RJJOA:4166; Drains:3025] Intake/Output this shift: No intake/output data recorded.  PE: Abd: soft, diffuse anasarca noted, surgical drain with over 3L of ascites drained yesterday.  Midline wound is c/d/i with staples present.  Appropriately tender, +BS  Lab Results:  Recent Labs    09/05/17 0357 09/05/17 1120 09/06/17 0352  WBC 8.4  --  6.8  HGB 7.0* 8.8* 8.7*  HCT 20.8* 25.9* 25.9*  PLT 75*  --  64*   BMET Recent Labs    09/05/17 0357 09/06/17 0352  NA 135 136  K 4.0 3.7  CL 105 108  CO2 24 25  GLUCOSE 134* 129*  BUN 18 17  CREATININE 1.29* 1.02*  CALCIUM 8.3* 8.5*   PT/INR Recent Labs    09/03/17 2219 09/04/17 0433  LABPROT 19.4* 19.1*  INR 1.65 1.62   CMP     Component Value Date/Time   NA 136 09/06/2017 0352   K 3.7 09/06/2017 0352   CL 108 09/06/2017 0352   CO2 25 09/06/2017 0352   GLUCOSE 129 (H) 09/06/2017 0352   BUN 17 09/06/2017 0352   CREATININE 1.02 (H) 09/06/2017 0352   CREATININE 0.79 02/26/2017 1407   CALCIUM 8.5 (L) 09/06/2017 0352   PROT 5.1 (L) 09/06/2017 0352   PROT 7.2 09/16/2013   ALBUMIN 3.1 (L) 09/06/2017 0352   ALBUMIN 3.6 09/16/2013   AST 18 09/06/2017 0352   AST 44 09/16/2013   ALT 13 (L) 09/06/2017 0352   ALT 39 09/16/2013   ALKPHOS 55 09/06/2017 0352   ALKPHOS 146  09/16/2013   BILITOT 2.3 (H) 09/06/2017 0352   BILITOT 0.6 09/16/2013   GFRNONAA 52 (L) 09/06/2017 0352   GFRNONAA 83 05/12/2015 0905   GFRAA >60 09/06/2017 0352   GFRAA >89 05/12/2015 0905   Lipase     Component Value Date/Time   LIPASE 25 07/23/2017 1515       Studies/Results: No results found.  Anti-infectives: Anti-infectives (From admission, onward)   Start     Dose/Rate Route Frequency Ordered Stop   09/06/17 1400  ertapenem (INVANZ) 1 g in sodium chloride 0.9 % 100 mL IVPB     1 g 200 mL/hr over 30 Minutes Intravenous Every 24 hours 09/06/17 0344     09/04/17 1200  ertapenem (INVANZ) 1 g in sodium chloride 0.9 % 50 mL IVPB  Status:  Discontinued     1 g 100 mL/hr over 30 Minutes Intravenous Every 24 hours 09/03/17 1720 09/06/17 0344   09/03/17 2200  piperacillin-tazobactam (ZOSYN) IVPB 3.375 g  Status:  Discontinued     3.375 g 12.5 mL/hr over 240 Minutes Intravenous Every 8 hours 09/03/17 1801 09/03/17 1815   09/03/17 1245  ertapenem (INVANZ) 1 g in sodium chloride 0.9 % 100 mL IVPB     1 g 200 mL/hr over  41 Minutes Intravenous  Once 09/03/17 1239 09/03/17 1310       Assessment/Plan Principal Problem:   Anasarca Active Problems:   Hyperlipidemia   Thrombocytopenia (HCC)   PUD (peptic ulcer disease)   Liver cirrhosis secondary to NASH (HCC)   Hypothyroidism   Cirrhosis of liver with ascites (HCC)   Ascites   Rectal bleeding   S/P partial colectomy   Encounter for central line placement   Palliative care by specialist   Goals of care, counseling/discussion   DNR (do not resuscitate) discussion   Hypovolemic shock ([place])  S/P exploratory laparotomy, partial colectomy, Dr. Arnoldo Morale, 06/05, POD 3 - leave honeycomb dressing on until 06/10 - + flatus, no BM, will give clear liquids today -pulm toilet with IS -needs to get up in a chair.  Likely can't mobilize otherwise too well right now secondary to femoral line in place. -surgical drain is going to  cont to drain a lot of ascites secondary to cirrhosis.  May at some point just need to remove it when further out from surgery and manage ascites with paracenteses.   FEN: clears VTE: SCDs ID: Invanz 06/06 Foley: yes Follow up: Dr. Arnoldo Morale      LOS: 5 days    Henreitta Cea , [facility] 09/06/2017, 8:12 AM Pager: [phone]

## 2017-09-06 NOTE — Plan of Care (Signed)
  Problem: Education: Goal: Knowledge of General Education information will improve Outcome: Progressing   Problem: Health Behavior/Discharge Planning: Goal: Ability to manage health-related needs will improve Outcome: Progressing   Problem: Clinical Measurements: Goal: Ability to maintain clinical measurements within normal limits will improve Outcome: Progressing Goal: Will remain free from infection Outcome: Progressing Goal: Diagnostic test results will improve Outcome: Progressing Goal: Respiratory complications will improve Outcome: Progressing Goal: Cardiovascular complication will be avoided Outcome: Progressing   Problem: Activity: Goal: Risk for activity intolerance will decrease Outcome: Progressing   Problem: Nutrition: Goal: Adequate nutrition will be maintained Outcome: Progressing   Problem: Coping: Goal: Level of anxiety will decrease Outcome: Progressing   Problem: Elimination: Goal: Will not experience complications related to bowel motility Outcome: Progressing Goal: Will not experience complications related to urinary retention Outcome: Progressing   Problem: Pain Managment: Goal: General experience of comfort will improve Outcome: Progressing   Problem: Safety: Goal: Ability to remain free from injury will improve Outcome: Progressing   Problem: Skin Integrity: Goal: Risk for impaired skin integrity will decrease Outcome: Progressing   Problem: Clinical Measurements: Goal: Ability to maintain clinical measurements within normal limits will improve Outcome: Progressing Goal: Postoperative complications will be avoided or minimized Outcome: Progressing   Problem: Skin Integrity: Goal: Demonstration of wound healing without infection will improve Outcome: Progressing

## 2017-09-06 NOTE — Progress Notes (Addendum)
  Progress Note    09/06/2017 10:26 AM 2 Days Post-Op  Subjective:  She denies stroke like symptoms including L sided weakness, slurring speech, or changes in vision   Vitals:   09/06/17 0800 09/06/17 0900  BP: (!) 117/49 (!) 113/52  Pulse: 76 83  Resp: 18 (!) 21  Temp:    SpO2: 92% 95%   Physical Exam: Lungs:  Non labored Incisions:  R neck incision without hematoma; minimal output from drain; drain removed, no bleeding from exit site Abdomen:  Soft distended Neurologic: A&O  CBC    Component Value Date/Time   WBC 6.8 09/06/2017 0352   RBC 2.52 (L) 09/06/2017 0352   HGB 8.7 (L) 09/06/2017 0352   HCT 25.9 (L) 09/06/2017 0352   PLT 64 (L) 09/06/2017 0352   MCV 102.8 (H) 09/06/2017 0352   MCH 34.5 (H) 09/06/2017 0352   MCHC 33.6 09/06/2017 0352   RDW 14.5 09/06/2017 0352   LYMPHSABS 1.2 09/06/2017 0352   MONOABS 0.5 09/06/2017 0352   EOSABS 0.1 09/06/2017 0352   BASOSABS 0.0 09/06/2017 0352    BMET    Component Value Date/Time   NA 136 09/06/2017 0352   K 3.7 09/06/2017 0352   CL 108 09/06/2017 0352   CO2 25 09/06/2017 0352   GLUCOSE 129 (H) 09/06/2017 0352   BUN 17 09/06/2017 0352   CREATININE 1.02 (H) 09/06/2017 0352   CREATININE 0.79 02/26/2017 1407   CALCIUM 8.5 (L) 09/06/2017 0352   GFRNONAA 52 (L) 09/06/2017 0352   GFRNONAA 83 05/12/2015 0905   GFRAA >60 09/06/2017 0352   GFRAA >89 05/12/2015 0905    INR    Component Value Date/Time   INR 1.62 09/04/2017 0433     Intake/Output Summary (Last 24 hours) at 09/06/2017 1026 Last data filed at 09/06/2017 0900 Gross per 24 hour  Intake 3308.08 ml  Output 4370 ml  Net -1061.92 ml     Assessment/Plan:  77 y.o. female is s/p repair of R IVJ and CCA 2 Days Post-Op   Drain removed from R neck today; no hematoma Incision unremarkable Vascular surgery will sign off for now Explained to patient and family member no follow up needed unless having trouble with neck incision  Dagoberto Ligas,  PA-C [facility] [phone] 09/06/2017 10:26 AM  Agree with above.  No hematoma.  Will sign off  Ruta Hinds, MD [facility] [place] Office: [phone] Pager: [phone]

## 2017-09-07 LAB — BASIC METABOLIC PANEL
Anion gap: 5 (ref 5–15)
BUN: 14 mg/dL (ref 6–20)
CALCIUM: 9 mg/dL (ref 8.9–10.3)
CO2: 25 mmol/L (ref 22–32)
CREATININE: 0.82 mg/dL (ref 0.44–1.00)
Chloride: 109 mmol/L (ref 101–111)
GFR calc non Af Amer: >60 mL/min (ref >60)
Glucose, Bld: 110 mg/dL — ABNORMAL HIGH (ref 65–99)
Potassium: 4 mmol/L (ref 3.5–5.1)
SODIUM: 139 mmol/L (ref 135–145)

## 2017-09-07 LAB — TYPE AND SCREEN
ABO/RH(D): O POS
Antibody Screen: NEGATIVE
UNIT DIVISION: 0
Unit division: 0

## 2017-09-07 LAB — CBC WITH DIFFERENTIAL/PLATELET
Abs Immature Granulocytes: 0 10*3/uL (ref 0.0–0.1)
BASOS ABS: 0 10*3/uL (ref 0.0–0.1)
BASOS PCT: 0 %
EOS ABS: 0.1 10*3/uL (ref 0.0–0.7)
EOS PCT: 2 %
HCT: 23.6 % — ABNORMAL LOW (ref 36.0–46.0)
Hemoglobin: 8 g/dL — ABNORMAL LOW (ref 12.0–15.0)
Immature Granulocytes: 0 %
Lymphocytes Relative: 21 %
Lymphs Abs: 1 10*3/uL (ref 0.7–4.0)
MCH: 34.5 pg — ABNORMAL HIGH (ref 26.0–34.0)
MCHC: 33.9 g/dL (ref 30.0–36.0)
MCV: 101.7 fL — ABNORMAL HIGH (ref 78.0–100.0)
Monocytes Absolute: 0.4 10*3/uL (ref 0.1–1.0)
Monocytes Relative: 9 %
Neutro Abs: 3.2 10*3/uL (ref 1.7–7.7)
Neutrophils Relative %: 68 %
Platelets: 57 10*3/uL — ABNORMAL LOW (ref 150–400)
RBC: 2.32 MIL/uL — ABNORMAL LOW (ref 3.87–5.11)
RDW: 13.7 % (ref 11.5–15.5)
WBC: 4.8 10*3/uL (ref 4.0–10.5)

## 2017-09-07 LAB — BPAM RBC
Blood Product Expiration Date: 201906252359
Blood Product Expiration Date: 201906252359
ISSUE DATE / TIME: 201906070115
UNIT TYPE AND RH: 5100
Unit Type and Rh: 9500

## 2017-09-07 LAB — PROCALCITONIN: PROCALCITONIN: 1.48 ng/mL

## 2017-09-07 LAB — MAGNESIUM: MAGNESIUM: 1.9 mg/dL (ref 1.7–2.4)

## 2017-09-07 LAB — PHOSPHORUS: PHOSPHORUS: 2 mg/dL — AB (ref 2.5–4.6)

## 2017-09-07 MED ORDER — SODIUM PHOSPHATES 45 MMOLE/15ML IV SOLN
10.0000 mmol | Freq: Once | INTRAVENOUS | Status: AC
  Administered 2017-09-07: 10 mmol via INTRAVENOUS
  Filled 2017-09-07: qty 3.33

## 2017-09-07 MED ORDER — HYDROMORPHONE HCL 1 MG/ML IJ SOLN: 0.5000 mg | INTRAMUSCULAR | Status: DC | PRN

## 2017-09-07 MED ORDER — TRAMADOL HCL 50 MG PO TABS
50.0000 mg | ORAL_TABLET | Freq: Four times a day (QID) | ORAL | Status: DC | PRN
  Administered 2017-09-07 – 2017-09-10 (×2): 50 mg via ORAL
  Filled 2017-09-07 (×2): qty 1

## 2017-09-07 MED ORDER — OXYCODONE HCL 5 MG PO TABS: 5.0000 mg | ORAL_TABLET | Freq: Four times a day (QID) | ORAL | Status: DC | PRN

## 2017-09-07 NOTE — Progress Notes (Signed)
Patient ID: Melanie Porter, female   DOB: September 24, 1940, 77 y.o.   MRN: [id number]    3 Days Post-Op  Subjective: RN states patient had some confusion overnight.  Tolerated her liquids well yesterday, but then didn't eat much last night.  Still on 12 of levo.  Objective: Vital signs in last 24 hours: Temp:  [97.6 F (36.4 C)-98.3 F (36.8 C)] 98.3 F (36.8 C) (06/09 0733) Pulse Rate:  [72-91] 72 (06/09 0700) Resp:  [14-25] 16 (06/09 0700) BP: (82-118)/(37-61) 103/41 (06/09 0700) SpO2:  [92 %-100 %] 97 % (06/09 0700) Arterial Line BP: (99-142)/(36-52) 111/43 (06/09 0700) Weight:  [82.7 kg (182 lb 5.1 oz)] 82.7 kg (182 lb 5.1 oz) (06/09 0600) Last BM Date: 09/03/17  Intake/Output from previous day: 06/08 0701 - 06/09 0700 In: 3003.6 [P.O.:480; I.V.:2023.6; IV Piggyback:500] Out: 5365 [Urine:1615; Drains:3750] Intake/Output this shift: No intake/output data recorded.  PE: Abd: soft, appropriately tender, midline incision c/d/i with staples.  RLQ drain with serosang ascitic output. 3750cc yesterday. Some BS.  Anasarca noted.  Lab Results:  Recent Labs    09/06/17 0352 09/07/17 0416  WBC 6.8 4.8  HGB 8.7* 8.0*  HCT 25.9* 23.6*  PLT 64* 57*   BMET Recent Labs    09/06/17 0352 09/07/17 0416  NA 136 139  K 3.7 4.0  CL 108 109  CO2 25 25  GLUCOSE 129* 110*  BUN 17 14  CREATININE 1.02* 0.82  CALCIUM 8.5* 9.0   PT/INR No results for input(s): LABPROT, INR in the last 72 hours. CMP     Component Value Date/Time   NA 139 09/07/2017 0416   K 4.0 09/07/2017 0416   CL 109 09/07/2017 0416   CO2 25 09/07/2017 0416   GLUCOSE 110 (H) 09/07/2017 0416   BUN 14 09/07/2017 0416   CREATININE 0.82 09/07/2017 0416   CREATININE 0.79 02/26/2017 1407   CALCIUM 9.0 09/07/2017 0416   PROT 5.1 (L) 09/06/2017 0352   PROT 7.2 09/16/2013   ALBUMIN 3.1 (L) 09/06/2017 0352   ALBUMIN 3.6 09/16/2013   AST 18 09/06/2017 0352   AST 44 09/16/2013   ALT 13 (L) 09/06/2017 0352   ALT 39  09/16/2013   ALKPHOS 55 09/06/2017 0352   ALKPHOS 146 09/16/2013   BILITOT 2.3 (H) 09/06/2017 0352   BILITOT 0.6 09/16/2013   GFRNONAA >60 09/07/2017 0416   GFRNONAA 83 05/12/2015 0905   GFRAA >60 09/07/2017 0416   GFRAA >89 05/12/2015 0905   Lipase     Component Value Date/Time   LIPASE 25 07/23/2017 1515       Studies/Results: No results found.  Anti-infectives: Anti-infectives (From admission, onward)   Start     Dose/Rate Route Frequency Ordered Stop   09/06/17 1400  ertapenem (INVANZ) 1 g in sodium chloride 0.9 % 100 mL IVPB     1 g 200 mL/hr over 30 Minutes Intravenous Every 24 hours 09/06/17 0344     09/04/17 1200  ertapenem (INVANZ) 1 g in sodium chloride 0.9 % 50 mL IVPB  Status:  Discontinued     1 g 100 mL/hr over 30 Minutes Intravenous Every 24 hours 09/03/17 1720 09/06/17 0344   09/03/17 2200  piperacillin-tazobactam (ZOSYN) IVPB 3.375 g  Status:  Discontinued     3.375 g 12.5 mL/hr over 240 Minutes Intravenous Every 8 hours 09/03/17 1801 09/03/17 1815   09/03/17 1245  ertapenem (INVANZ) 1 g in sodium chloride 0.9 % 100 mL IVPB     1 g  200 mL/hr over 30 Minutes Intravenous  Once 09/03/17 1239 09/03/17 1310       Assessment/Plan Principal Problem: Anasarca Active Problems: Hyperlipidemia Thrombocytopenia (HCC) PUD (peptic ulcer disease) Liver cirrhosis secondary to NASH (HCC) Hypothyroidism Cirrhosis of liver with ascites (HCC) Ascites Rectal bleeding S/P partial colectomy Encounter for central line placement Palliative care by specialist Goals of care, counseling/discussion DNR (do not resuscitate) discussion Hypovolemic shock ([place])  S/P exploratory laparotomy, partial colectomy, Dr. Arnoldo Morale, 06/05, POD 3 - leave honeycomb dressing on until 06/10 - on clears, not eating much, ? Secondary to slow bowel function vs poor appetite post-op. -pulm toilet with IS -needs to get up in a chair.  Can't mobilize otherwise  too well right now secondary to femoral line in place. -surgical drain is going to cont to drain a lot of ascites secondary to cirrhosis.  May at some point just need to remove it when further out from surgery and manage ascites with paracenteses.   FEN: clears VTE: SCDs ID: Invanz 06/06 Foley: purewick Follow up: Dr. Arnoldo Morale     LOS: 6 days    Henreitta Cea , [facility] 09/07/2017, 7:55 AM Pager: [phone]

## 2017-09-07 NOTE — Progress Notes (Signed)
PULMONARY / CRITICAL CARE MEDICINE   Name: Melanie Porter MRN: [id number] DOB: 12-Feb-1941    ADMISSION DATE:  09/01/2017 CONSULTATION DATE:  6/6  REFERRING MD:  Tat Deneise Lever [place])   CHIEF COMPLAINT:  Peritonitis, malpositioned R IJ   HISTORY OF PRESENT ILLNESS:   77yo female with hx NASH cirrhosis, hypothyroidism, thrombocytopenia with recent admission 3/31-4/3 and again 4/24-4/30 for decompensated cirrhosis and was diureses and had multiple paracentesis. She returned to [facility] 6/3 again with anasarca, hematochezia and decompensated cirrhosis.  She underwent colonoscopy 6/5 which was complicated by colonic perforation requiring exploratory lap with partial colectomy.  Post op she had difficulties with volume status and labile BP requiring vasopressors and central line was placed.  Unfortunately CT of the chest revealed that R IJ CVL crossed the midline and entered R carotid with tip in the aortic arch.  She was tx 6/6 to [facility] for vascular surgery and PCCM consulted for ICU admission/medical management.  She is still complaining of abdominal discomfort.  She has not had any stool or flatus.  She is not having any shortness of breath.  She is still on a modest dose of levo fed.  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  PAST SURGICAL HISTORY: She  has a past surgical history that includes Breast biopsy (1982); Partial hysterectomy; Esophagogastroduodenoscopy (03/02/2012); Esophagogastroduodenoscopy (N/A, 07/15/2014); Abdominal hysterectomy; Colonoscopy (June  2010); Esophagogastroduodenoscopy (N/A, 06/05/2015); Cataract extraction w/PHACO (Right, 07/11/2015); Cataract extraction w/PHACO (Left, 07/25/2015); laparotomy (N/A, 09/03/2017); Partial colectomy (09/03/2017); Colonoscopy (N/A, 09/03/2017); and Esophagogastroduodenoscopy (N/A, 09/03/2017).  No Known Allergies  No current facility-administered medications on file prior to encounter.    Current Outpatient Medications on File Prior to Encounter  Medication Sig  . aspirin EC 81 MG tablet Take 81 mg by mouth at bedtime.  . Cholecalciferol (VITAMIN D3) 5000 units CAPS Take 1 capsule daily by mouth.  Marland Kitchen FLUoxetine (PROZAC) 20 MG capsule Take 20 mg by mouth daily.  . furosemide (LASIX) 80 MG tablet Take 1 tablet (80 mg total) by mouth 2 (two) times daily.  Marland Kitchen KLOR-CON M20 20 MEQ tablet Take 20 mEq by mouth daily.  Marland Kitchen levothyroxine (SYNTHROID, LEVOTHROID) 100 MCG tablet Take 100 mcg by mouth daily.  Marland Kitchen lidocaine-hydrocortisone (ANAMANTEL HC) 3-0.5 % CREA Apply to the external hemorrhoid area 4 times daily as needed  . loratadine-pseudoephedrine (CLARITIN-D 24-HOUR) 10-240 MG 24 hr tablet Take 1 tablet by mouth daily as needed for allergies.  . magnesium oxide (MAG-OX) 400 (241.3 Mg) MG tablet Take 1 tablet (400 mg total) by mouth daily.  . pantoprazole (PROTONIX) 40 MG tablet Take 1 tablet (40 mg total) by mouth daily.  Marland Kitchen Propylene Glycol (SYSTANE BALANCE) 0.6 % SOLN Apply 1 drop to eye daily as needed. Dry Eyes  . simvastatin (ZOCOR) 40 MG tablet Take 40 mg by mouth daily.   Marland Kitchen spironolactone (ALDACTONE) 50 MG tablet Take 1 tablet (50 mg total) by mouth 2 (two) times daily.    FAMILY HISTORY:  Her indicated that her mother is deceased. She indicated that her father is deceased. She indicated that the status of her sister is unknown. She indicated that the status of her brother is  unknown. She indicated that the status of her neg hx is unknown.   SOCIAL HISTORY: She  reports that she has never smoked. She has  never used smokeless tobacco. She reports that she does not drink alcohol or use drugs.  REVIEW OF SYSTEMS:   Review of records shows an echocardiogram performed on 4/1 which shows moderate aortic stenosis and normal LV systolic function.  SUBJECTIVE:    VITAL SIGNS: BP (!) 103/41   Pulse 72   Temp 98.3 F (36.8 C) (Oral)   Resp 16   Ht 5\' 2"  (1.575 m)   Wt 182 lb 5.1 oz (82.7 kg)   SpO2 97%   BMI 33.35 kg/m   HEMODYNAMICS:    VENTILATOR SETTINGS:    INTAKE / OUTPUT: I/O last 3 completed shifts: In: 4570.4 [P.O.:600; I.V.:3220.4; IV Piggyback:750] Out: 1638 [Urine:2370; Drains:5000]  PHYSICAL EXAMINATION: General: She is alert sitting up in bed and appropriately interactive.       Neuro: Speech is appropriate.  She is moving all fours.  She has a slight right eyelid droop but otherwise the face is symmetric.  Pupils are equal and EOMs appear to be full    ENT: There is no JVD  Cardiovascular: S1 and S2 are regular with a 4 out of 6 systolic ejection murmur  Lungs: Respirations are unlabored, there is symmetric air movement, no wheezes, no rhonchi.     Abdomen: The abdomen is obese and soft with reproducible left upper quadrant tenderness.  There is no guarding and there are some bowel sounds present.  The JP is continuing to drain serous fluid.       Musculoskeletal: Warm and dry, 1+ BLE edema   LABS:  BMET Recent Labs  Lab 09/05/17 0357 09/06/17 0352 09/07/17 0416  NA 135 136 139  K 4.0 3.7 4.0  CL 105 108 109  CO2 24 25 25   BUN 18 17 14   CREATININE 1.29* 1.02* 0.82  GLUCOSE 134* 129* 110*    Electrolytes Recent Labs  Lab 09/01/17 1120  09/04/17 0433 09/05/17 0357 09/06/17 0352 09/07/17 0416  CALCIUM  --    < > 8.4* 8.3* 8.5* 9.0  MG 1.6*  --  1.3*  --  1.9 1.9  PHOS 3.1  --  3.3  --   --  2.0*   < > = values in this interval not displayed.    CBC Recent Labs  Lab 09/05/17 0357 09/05/17 1120 09/06/17 0352 09/07/17 0416  WBC 8.4  --  6.8  4.8  HGB 7.0* 8.8* 8.7* 8.0*  HCT 20.8* 25.9* 25.9* 23.6*  PLT 75*  --  64* 57*    Coag's Recent Labs  Lab 09/03/17 0438 09/03/17 2219 09/04/17 0433  APTT  --  36 37*  INR 1.80 1.65 1.62    Sepsis Markers Recent Labs  Lab 09/04/17 1625  09/05/17 1000 09/06/17 0352 09/07/17 0416  LATICACIDVEN 1.8  1.9  --   --   --   --   PROCALCITON  --    < > 4.05 2.69 1.48   < > = values in this interval not displayed.    ABG No results for input(s): PHART, PCO2ART, PO2ART in the last 168 hours.  Liver Enzymes Recent Labs  Lab 09/04/17 0433 09/05/17 0357 09/06/17 0352  AST 33 23 18  ALT 21 17 13*  ALKPHOS 81 63 55  BILITOT 1.6* 1.7* 2.3*  ALBUMIN 2.7* 2.7* 3.1*    Cardiac Enzymes No results for  input(s): TROPONINI, PROBNP in the last 168 hours.  Glucose No results for input(s): GLUCAP in the last 168 hours.  Imaging No results found.   STUDIES:  CT chest 6/5>>> 1. Right-sided vascular access catheter is intra arterial with tip in the aortic arch. The catheter traverses the right internal jugular vein and enters the right common carotid artery before continuing inferiorly to the aortic arch. There is surrounding soft tissue edema that extends into the right upper chest wall and axilla.  2. Large volume pneumoperitoneum, consistent with recent perforation and subsequent abdominal surgery. 3.  Aortic Atherosclerosis (ICD10-I70.0).  CULTURES:   ANTIBIOTICS: Ertapenem 6/6>>>   SIGNIFICANT EVENTS: 6/5 OR >> exploratory lap, repair of colonic perforation  6/6 OR>> removal of malpositioned, arterially placed central line   LINES/TUBES: R IJ CVL 6/5>>>6/6  DISCUSSION: 77yo female with hx NASH cirrhosis and multiple recent admissions for anasarca admitted 6/3 with decompensated cirrhosis and hematochezia.  Underwent colonoscopy which was complicated by colonic perforation requiring exploratory lap.  Had R IJ CVL placed for pressor needs which unfortunately crossed  the midline and terminated in carotid requiring tx to [facility] for vascular surgery.   ASSESSMENT / PLAN:  PULMONARY No active issue P:   Pulmonal O2 as needed Pulmonary hygiene     CARDIOVASCULAR Hypotension.  She is still requiring a modest dose of levo fed. Her procalcitonin continues to drop but she does have some reproducible left upper quadrant tenderness.  I am going to continue to observe her from an infectious disease standpoint today on ertapenem.  She also has valvular disease which is potentially contributing to her hypotension with at least moderate aortic stenosis on her last echo.  Finally she is leaking ascites out of her JP drain and I am continuing her on intravenous albumin hoping to keep her intravascular volume repleted.  Malpositioned right IJ CVL into carotid- status post OR removal by vascular surgery There is no focal neurological deficit at present  RENAL AKI-mild Hypokalemia Hypomg P:   Follow-up chemistry Holding diuretics for now  Monitor urine output closely replace electrolytes as needed  GASTROINTESTINAL [name] cirrhosis Ascites Hematochezia - resolved  Perforated colon-  Iatrogenic.  S/P partial colectomy 6/5 Peritonitis P:   Continue ertapenem Holding diuretics for now as above Trend LFTs, ammonia Holding off on further paracentesis for now  HEMATOLOGIC Thrombocytopenia-secondary to cirrhosis P: Her hemoglobin continues to drift down slightly.  She is denying passing any blood per rectum.  Her fluid balance is running negative and I am concerned that we need to continue following her hemoglobins.    INFECTIOUS Peritonitis-status post colon perforation P:   Continue ertapenem as above Procalcitonin is trending down and there is no left shift.  ENDOCRINE Hypothyroid P:   Continue Synthroid.    NEUROLOGIC Mental status-multifactorial post anesthesia and in the setting of liver failure P:   Mental status is much improved with control  of her intra-abdominal sepsis     Lars Masson, MD 09/07/2017  9:26 AM Critical Care Medicine

## 2017-09-07 NOTE — Plan of Care (Signed)
  Problem: Clinical Measurements: Goal: Ability to maintain clinical measurements within normal limits will improve Outcome: Progressing Goal: Respiratory complications will improve Outcome: Progressing Goal: Cardiovascular complication will be avoided Outcome: Progressing   Problem: Elimination: Goal: Will not experience complications related to urinary retention Outcome: Progressing   Problem: Nutrition: Goal: Adequate nutrition will be maintained Outcome: Not Progressing
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
## 2017-09-08 ENCOUNTER — Encounter (HOSPITAL_COMMUNITY): Payer: Self-pay | Admitting: Vascular Surgery

## 2017-09-08 LAB — BASIC METABOLIC PANEL
ANION GAP: 6 (ref 5–15)
BUN: 13 mg/dL (ref 6–20)
CO2: 22 mmol/L (ref 22–32)
Calcium: 9.3 mg/dL (ref 8.9–10.3)
Chloride: 110 mmol/L (ref 101–111)
Creatinine, Ser: 0.91 mg/dL (ref 0.44–1.00)
GFR calc Af Amer: >60 mL/min (ref >60)
GFR, EST NON AFRICAN AMERICAN: 60 mL/min — AB (ref >60)
GLUCOSE: 115 mg/dL — AB (ref 65–99)
POTASSIUM: 4.1 mmol/L (ref 3.5–5.1)
Sodium: 138 mmol/L (ref 135–145)

## 2017-09-08 LAB — CBC WITH DIFFERENTIAL/PLATELET
Abs Immature Granulocytes: 0 10*3/uL (ref 0.0–0.1)
Basophils Absolute: 0 10*3/uL (ref 0.0–0.1)
Basophils Relative: 0 %
Eosinophils Absolute: 0 10*3/uL (ref 0.0–0.7)
Eosinophils Relative: 1 %
HEMATOCRIT: 25.5 % — AB (ref 36.0–46.0)
HEMOGLOBIN: 8.7 g/dL — AB (ref 12.0–15.0)
IMMATURE GRANULOCYTES: 0 %
LYMPHS ABS: 0.9 10*3/uL (ref 0.7–4.0)
LYMPHS PCT: 14 %
MCH: 34.8 pg — ABNORMAL HIGH (ref 26.0–34.0)
MCHC: 34.1 g/dL (ref 30.0–36.0)
MCV: 102 fL — ABNORMAL HIGH (ref 78.0–100.0)
Monocytes Absolute: 0.6 10*3/uL (ref 0.1–1.0)
Monocytes Relative: 10 %
NEUTROS ABS: 4.7 10*3/uL (ref 1.7–7.7)
NEUTROS PCT: 75 %
Platelets: 66 10*3/uL — ABNORMAL LOW (ref 150–400)
RBC: 2.5 MIL/uL — AB (ref 3.87–5.11)
RDW: 13.5 % (ref 11.5–15.5)
WBC: 6.3 10*3/uL (ref 4.0–10.5)

## 2017-09-08 MED ORDER — MIDODRINE HCL 5 MG PO TABS
5.0000 mg | ORAL_TABLET | Freq: Three times a day (TID) | ORAL | Status: DC
  Administered 2017-09-08 – 2017-09-09 (×3): 5 mg via ORAL
  Filled 2017-09-08 (×3): qty 1

## 2017-09-08 MED ORDER — ALBUMIN HUMAN 25 % IV SOLN
25.0000 g | Freq: Once | INTRAVENOUS | Status: AC
Start: 2017-09-08 — End: 2017-09-08
  Administered 2017-09-08: 25 g via INTRAVENOUS
  Filled 2017-09-08: qty 50

## 2017-09-08 NOTE — Progress Notes (Signed)
Follow up - Critical Care Medicine Note  Patient Details:    Melanie Porter is an 77 y.o. female.  She has known [name] cirrhosis and has been experiencing a progressive functional decline characterized by increasing weakness and loss of appetite with weight loss over the last several months.  She unfortunately suffered a colonic perforation following colonoscopy performed presumably to investigate a history of bright red blood per rectum.  She underwent laparotomy which was in turn complicated by an arterial line placement.    Lines, Airways, Drains: CVC Triple Lumen 09/03/17 Right Femoral (Active)  Indication for Insertion or Continuance of Line Vasoactive infusions 09/08/2017  8:00 AM  Site Assessment Clean;Dry;Intact 09/08/2017  8:00 AM  Proximal Lumen Status Infusing 09/08/2017  8:00 AM  Medial Lumen Status Infusing 09/08/2017  8:00 AM  Distal Lumen Status Infusing 09/08/2017  8:00 AM  Dressing Type Transparent;Occlusive 09/08/2017  8:00 AM  Dressing Status Clean;Dry;Intact;Antimicrobial disc in place 09/08/2017  8:00 AM  Line Care Connections checked and tightened 09/08/2017  8:00 AM  Dressing Intervention New dressing;Dressing changed;Antimicrobial disc changed;Securement device changed 09/04/2017  8:00 AM  Dressing Change Due 09/11/17 09/08/2017  8:00 AM     Arterial Line 09/04/17 Radial (Active)  Site Assessment Clean;Dry;Intact 09/08/2017  8:00 AM  Line Status Pulsatile blood flow 09/08/2017  8:00 AM  Art Line Waveform Appropriate 09/08/2017  8:00 AM  Art Line Interventions Zeroed and calibrated 09/08/2017  8:00 AM  Color/Movement/Sensation Capillary refill less than 3 sec 09/08/2017  8:00 AM  Dressing Type Transparent;Occlusive 09/08/2017  8:00 AM  Dressing Status Clean;Dry;Intact;Antimicrobial disc in place 09/08/2017  8:00 AM  Dressing Change Due 09/11/17 09/08/2017  8:00 AM     Closed System Drain 1 Right;Lateral Abdomen Bulb (JP) (Active)  Site Description Unable to view 09/08/2017  8:00 AM   Dressing Status Clean;Dry;Intact 09/08/2017  8:00 AM  Drainage Appearance Serosanguineous 09/08/2017  8:00 AM  Status To gravity (Uncharged) 09/08/2017  8:00 AM  Intake (mL) 300 ml 09/08/2017  2:02 PM  Output (mL) 600 mL 09/08/2017  6:59 AM     External Urinary Catheter (Active)  Collection Container Dedicated Suction Canister 09/08/2017  8:00 AM  Securement Method Tape 09/08/2017  6:00 AM  Intervention Equipment Changed 09/08/2017  6:00 AM  Output (mL) 25 mL 09/08/2017 10:00 AM    Anti-infectives:  Anti-infectives (From admission, onward)   Start     Dose/Rate Route Frequency Ordered Stop   09/06/17 1400  ertapenem (INVANZ) 1 g in sodium chloride 0.9 % 100 mL IVPB     1 g 200 mL/hr over 30 Minutes Intravenous Every 24 hours 09/06/17 0344     09/04/17 1200  ertapenem (INVANZ) 1 g in sodium chloride 0.9 % 50 mL IVPB  Status:  Discontinued     1 g 100 mL/hr over 30 Minutes Intravenous Every 24 hours 09/03/17 1720 09/06/17 0344   09/03/17 2200  piperacillin-tazobactam (ZOSYN) IVPB 3.375 g  Status:  Discontinued     3.375 g 12.5 mL/hr over 240 Minutes Intravenous Every 8 hours 09/03/17 1801 09/03/17 1815   09/03/17 1245  ertapenem (INVANZ) 1 g in sodium chloride 0.9 % 100 mL IVPB     1 g 200 mL/hr over 30 Minutes Intravenous  Once 09/03/17 1239 09/03/17 1310      Microbiology: Results for orders placed or performed during the hospital encounter of 09/01/17  MRSA PCR Screening     Status: None   Collection Time: 09/03/17  5:18 PM  Result  Value Ref Range Status   MRSA by PCR NEGATIVE NEGATIVE Final    Comment:        The GeneXpert MRSA Assay (FDA approved for NASAL specimens only), is one component of a comprehensive MRSA colonization surveillance program. It is not intended to diagnose MRSA infection nor to guide or monitor treatment for MRSA infections. Performed at [facility], [address], [place], [place] [address]   Surgical pcr screen     Status: None   Collection Time:  09/04/17 11:29 AM  Result Value Ref Range Status   MRSA, PCR NEGATIVE NEGATIVE Final   Staphylococcus aureus NEGATIVE NEGATIVE Final    Comment: (NOTE) The Xpert SA Assay (FDA approved for NASAL specimens in patients 1 years of age and older), is one component of a comprehensive surveillance program. It is not intended to diagnose infection nor to guide or monitor treatment. Performed at [facility], [place] [address], [place], [place] [address]     Best Practice/Protocols:  VTE Prophylaxis: None due to thrombocytopenia  Events: Subsequently extubated.  Continues to require vasopressors.  Studies: Dg Chest 2 View  Result Date: 09/01/2017 CLINICAL DATA:  Coughing and shortness of breath over the last 2 weeks. EXAM: CHEST - 2 VIEW COMPARISON:  07/23/2017.  06/29/2017. FINDINGS: Poor inspiration or low lung volumes. Heart size is normal. Chronic aortic atherosclerosis. Bronchial thickening pattern consistent with bronchitis, but no consolidation or lobar collapse. No effusions. Chronic degenerative changes affect the spine. Old thoracolumbar compression fractures. IMPRESSION: Possible bronchitis. No consolidation or collapse. Poor inspiration versus low lung volumes. Electronically Signed   By: Nelson Chimes M.D.   On: 09/01/2017 12:27   Ct Chest W Contrast  Result Date: 09/03/2017 CLINICAL DATA:  Central line placement. EXAM: CT CHEST WITH CONTRAST TECHNIQUE: Multidetector CT imaging of the chest was performed during intravenous contrast administration. CONTRAST:  75 mL ISOVUE-300 IOPAMIDOL (ISOVUE-300) INJECTION 61% COMPARISON:  Chest radiograph 09/03/2017 FINDINGS: Cardiovascular: Right-sided vascular catheter traverses the right internal jugular vein and enters the right common carotid artery, with tip in the proximal aortic arch. There is atherosclerotic calcification in the aorta. Mild cardiomegaly. There is soft tissue edema in the right neck, extending into the upper right chest  wall and axilla. Mediastinum/Nodes: No mediastinal, hilar or axillary lymphadenopathy. The visualized thyroid and thoracic esophageal course are unremarkable. Lungs/Pleura: Lungs are clear. No pleural effusion or pneumothorax. Upper Abdomen: There is a large amount of free air in the anterior abdomen. The liver is diffusely nodular. Small volume ascites. Musculoskeletal: No chest wall abnormality. No acute or significant osseous findings. IMPRESSION: 1. Right-sided vascular access catheter is intra arterial with tip in the aortic arch. The catheter traverses the right internal jugular vein and enters the right common carotid artery before continuing inferiorly to the aortic arch. There is surrounding soft tissue edema that extends into the right upper chest wall and axilla. 2. Large volume pneumoperitoneum, consistent with recent perforation and subsequent abdominal surgery. 3.  Aortic Atherosclerosis (ICD10-I70.0). Critical Value/emergent results were called by telephone at the time of interpretation on 09/03/2017 at 10:35 pm to Dr. Aviva Signs , who verbally acknowledged these results. Electronically Signed   By: Ulyses Jarred M.D.   On: 09/03/2017 22:35   US Paracentesis  Result Date: 09/01/2017 INDICATION: Recurrent ascites, cirrhosis due to NASH EXAM: ULTRASOUND GUIDED THERAPEUTIC PARACENTESIS MEDICATIONS: None. COMPLICATIONS: None immediate. PROCEDURE: Procedure, benefits, and risks of procedure were discussed with patient. Written informed consent for procedure was obtained. Time  out protocol followed. Adequate collection of ascites localized by ultrasound in RIGHT lower quadrant. Skin prepped and draped in usual sterile fashion. Skin and soft tissues anesthetized with 10 mL of 1% lidocaine. 5 [place] Yueh catheter placed into peritoneal cavity. 5 L of clear yellow ascitic fluid aspirated by vacuum bottle suction. Procedure tolerated well by patient without immediate complication. FINDINGS: As above  IMPRESSION: Successful ultrasound-guided paracentesis yielding 5 liters of peritoneal fluid. Electronically Signed   By: Lavonia Dana M.D.   On: 09/01/2017 15:58   Dg Chest Port 1 View  Result Date: 09/03/2017 CLINICAL DATA:  Central line placement. EXAM: PORTABLE CHEST 1 VIEW COMPARISON:  Radiograph of September 01, 2017. FINDINGS: The heart size and mediastinal contours are within normal limits. Both lungs are clear. Hypoinflation of the lungs is noted. No pneumothorax or pleural effusion is noted. Interval placement of right-sided neck catheter. The tip is positioned more medially than expected and is concerning for possible arterial puncture. The visualized skeletal structures are unremarkable. IMPRESSION: Hypoinflation of the lungs. Interval placement of right-sided neck catheter. The distal tip is significantly more medial than would be expected for superior vena cava and internal jugular venous access. The possibility of inadvertent arterial catheterization with tip in aorta cannot be excluded. Clinical correlation is recommended as well as further evaluation with CT scan. These results will be called to the ordering clinician or representative by the Radiologist Assistant, and communication documented in the PACS or zVision Dashboard. Electronically Signed   By: Marijo Conception, M.D.   On: 09/03/2017 18:03    Consults: Treatment Team:  Danie Binder, MD   Subjective:    Overnight Issues:   Objective:  Vital signs for last 24 hours: Temp:  [97.4 F (36.3 C)-98 F (36.7 C)] 97.7 F (36.5 C) (06/10 1143) Pulse Rate:  [75-97] 82 (06/10 1415) Resp:  [15-24] 15 (06/10 1415) BP: (101-123)/(44-69) 111/55 (06/10 1400) SpO2:  [96 %-100 %] 100 % (06/10 1415) Arterial Line BP: (67-129)/(38-54) 115/42 (06/10 1415) Weight:  [183 lb 10.3 oz (83.3 kg)] 183 lb 10.3 oz (83.3 kg) (06/10 0500)  Hemodynamic parameters for last 24 hours:    Intake/Output from previous day: 06/09 0701 - 06/10 0700 In:  3208.5 [P.O.:520; I.V.:1938.5; IV Piggyback:750] Out: 3474 [Urine:900; Drains:4600]  Intake/Output this shift: Total I/O In: 1762.6 [I.V.:662.6; Other:1100] Out: 100 [Urine:100]  Vent settings for last 24 hours:    Physical Exam:  General: alert and no respiratory distress Neuro: alert, oriented and nonfocal exam HEENT/Neck: no JVD Resp: clear to auscultation bilaterally CVS: regular rate and rhythm, S1, S2 normal, no murmur, click, rub or gallop GI: wound clean and Ongoing drainage of ascitic fluid Skin: no rash Extremities: no edema, no erythema, pulses WNL and edema 2+  Assessment/Plan:   NEURO  Currently somnolent.  At risk for hepatic encephalopathy.   Plan: Consider ammonia level formally rule out hepatic encephalopathy.  Hypovolemia may also be contributing.  PULM  No pulmonary issues.   Plan: Mobilize as possible  CARDIO  Dehydration and Hypotension.  Cirrhosis may also impair clearance of bacterial mediated vasodilators.   Plan: Replete volume with albumin.  Start Midrin  RENAL  Appears volume contracted   Plan: Fluid resuscitation  GI  Lower GI Bleeding (Appears to have stopped) End-stage.  Loss of weight is a poor prognostic factor.   Plan: Engage in ongoing end of care discussions.  ID  Has completed treatment for aspiration.  Ongoing low blood pressure likely related to  hepatic disease rather than active infection.   Stop all antibiotics  HEME  Thrombocytopenia (due to splenic sequestration)   Plan: Hold chemical DVT prophylaxis  ENDO Currently euglycemic   Plan: Continue current diabetes management strategy.  Global Issues   Overall prognosis is poor given cirrhosis with no transplantation option and progressive functional decline.  Appreciate palliative care's input and would see gradually transitioning this patient to comfort care.   LOS: 7 days   Additional comments:DNR and no intubation.  Critical Care Total Time*: 30 Minutes  Rafaella Kole 09/08/2017  *Care during the described time interval was provided by me and/or other providers on the critical care team.  I have reviewed this patient's available data, including medical history, events of note, physical examination and test results as part of my evaluation.

## 2017-09-08 NOTE — Progress Notes (Signed)
[facility]/Trauma Progress Note  4 Days Post-Op   Assessment/Plan Principal Problem: Anasarca Active Problems: Hyperlipidemia Thrombocytopenia (HCC) PUD (peptic ulcer disease) Liver cirrhosis secondary to NASH (HCC) Hypothyroidism Cirrhosis of liver with ascites (HCC) Ascites Rectal bleeding S/P partial colectomy Encounter for central line placement Palliative care by specialist Goals of care, counseling/discussion DNR (do not resuscitate) discussion Hypovolemic shock ([place])  S/P exploratory laparotomy, partial colectomy, Dr. Arnoldo Morale, 06/05, POD 3 - on clears, not eating much, added breeze - pulm toilet with IS - needs to mobilize. Difficult 2/2 femoral line in place. - continue surgical drain, high output likely 2/2 cirrhosis. Fluid is serosanguinous.   QQI:WLNLGX until [facility] QJJ:HERD ID: Invanz 06/06 Foley: purewick Follow up: Dr. Arnoldo Morale    LOS: 7 days    Subjective: CC: abdominal pain  Pt states mild abdominal pain worse with movement. She states no flatus or BM. She denies nausea or vomiting. Family at bedside.   Objective: Vital signs in last 24 hours: Temp:  [97.4 F (36.3 C)-98 F (36.7 C)] 98 F (36.7 C) (06/10 0745) Pulse Rate:  [71-97] 93 (06/10 0800) Resp:  [14-26] 23 (06/10 0800) BP: (104-123)/(44-69) 120/52 (06/10 0800) SpO2:  [96 %-100 %] 98 % (06/10 0800) Arterial Line BP: (67-130)/(37-64) 114/47 (06/10 0815) Weight:  [83.3 kg (183 lb 10.3 oz)] 83.3 kg (183 lb 10.3 oz) (06/10 0500) Last BM Date: 09/03/17  Intake/Output from previous day: 06/09 0701 - 06/10 0700 In: 3208.5 [P.O.:520; I.V.:1938.5; IV Piggyback:750] Out: 4081 [Urine:900; Drains:4600] Intake/Output this shift: Total I/O In: 166.8 [I.V.:166.8] Out: 52 [Urine:75]  PE: Gen:  Alert, NAD, pleasant, cooperative Pulm:  Rate and effort normal Abd: Soft, not distended, +BS, midline incision with staples and is without signs of  infection. drain with serosanguinous drainage, mild TTP on left side, no guarding, no signs of peritonitis Skin: warm and dry   Anti-infectives: Anti-infectives (From admission, onward)   Start     Dose/Rate Route Frequency Ordered Stop   09/06/17 1400  ertapenem (INVANZ) 1 g in sodium chloride 0.9 % 100 mL IVPB     1 g 200 mL/hr over 30 Minutes Intravenous Every 24 hours 09/06/17 0344     09/04/17 1200  ertapenem (INVANZ) 1 g in sodium chloride 0.9 % 50 mL IVPB  Status:  Discontinued     1 g 100 mL/hr over 30 Minutes Intravenous Every 24 hours 09/03/17 1720 09/06/17 0344   09/03/17 2200  piperacillin-tazobactam (ZOSYN) IVPB 3.375 g  Status:  Discontinued     3.375 g 12.5 mL/hr over 240 Minutes Intravenous Every 8 hours 09/03/17 1801 09/03/17 1815   09/03/17 1245  ertapenem (INVANZ) 1 g in sodium chloride 0.9 % 100 mL IVPB     1 g 200 mL/hr over 30 Minutes Intravenous  Once 09/03/17 1239 09/03/17 1310      Lab Results:  Recent Labs    09/07/17 0416 09/08/17 0414  WBC 4.8 6.3  HGB 8.0* 8.7*  HCT 23.6* 25.5*  PLT 57* 66*   BMET Recent Labs    09/07/17 0416 09/08/17 0414  NA 139 138  K 4.0 4.1  CL 109 110  CO2 25 22  GLUCOSE 110* 115*  BUN 14 13  CREATININE 0.82 0.91  CALCIUM 9.0 9.3   PT/INR No results for input(s): LABPROT, INR in the last 72 hours. CMP     Component Value Date/Time   NA 138 09/08/2017 0414   K 4.1 09/08/2017 0414   CL 110 09/08/2017 0414  CO2 22 09/08/2017 0414   GLUCOSE 115 (H) 09/08/2017 0414   BUN 13 09/08/2017 0414   CREATININE 0.91 09/08/2017 0414   CREATININE 0.79 02/26/2017 1407   CALCIUM 9.3 09/08/2017 0414   PROT 5.1 (L) 09/06/2017 0352   PROT 7.2 09/16/2013   ALBUMIN 3.1 (L) 09/06/2017 0352   ALBUMIN 3.6 09/16/2013   AST 18 09/06/2017 0352   AST 44 09/16/2013   ALT 13 (L) 09/06/2017 0352   ALT 39 09/16/2013   ALKPHOS 55 09/06/2017 0352   ALKPHOS 146 09/16/2013   BILITOT 2.3 (H) 09/06/2017 0352   BILITOT 0.6 09/16/2013    GFRNONAA 60 (L) 09/08/2017 0414   GFRNONAA 83 05/12/2015 0905   GFRAA >60 09/08/2017 0414   GFRAA >89 05/12/2015 0905   Lipase     Component Value Date/Time   LIPASE 25 07/23/2017 1515    Studies/Results: No results found.    Kalman Drape , [facility] Surgery 09/08/2017, 9:02 AM  Pager: [phone] Mon-Wed, Friday 7:00am-4:30pm Thurs 7am-11:30am  Consults: [phone]

## 2017-09-08 NOTE — Progress Notes (Addendum)
Daily Progress Note   Patient Name: [name]       Date: 09/08/2017 DOB: [birth year]  Age: 77 y.o. MRN#: [id number] Attending Physician: Sampson Goon, MD Primary Care Physician: Sharilyn Sites, MD Admit Date: 09/01/2017  Reason for Consultation/Follow-up: Establishing goals of care and Psychosocial/spiritual support  Subjective: Patient resting in bed, and appears very sleepy. Son is at bedside. He states that he has been thinking about yesterday's conversation in regards to her cirrhosis, paracentesis, and quality of life, and wonders how she will progress both short and long term. Family processing this hospitalization in the larger context of her cirrhosis.  Will continue to monitor her progress with discussions on quality of life following discharge as well as discussion of palliative vs hospice phylosophy.  Spoke with surgery regarding possibility of continuing high output drain for symptom management should family decide on hospice.  Poor oral intake. Neo drip decreased. IV Albumin administrations.    Will continue to follow along.      Length of Stay: 7  Current Medications: Scheduled Meds:  . chlorhexidine  15 mL Mouth Rinse BID  . Chlorhexidine Gluconate Cloth  6 each Topical Once  . Chlorhexidine Gluconate Cloth  6 each Topical Daily  . levothyroxine  100 mcg Oral QAC breakfast  . mouth rinse  15 mL Mouth Rinse q12n4p  . midodrine  5 mg Oral TID WC  . nystatin cream   Topical BID  . sodium chloride flush  10-40 mL Intracatheter Q12H  . sodium chloride flush  3 mL Intravenous Q12H  . sodium chloride flush  3 mL Intravenous Q12H    Continuous Infusions: . sodium chloride Stopped (09/04/17 1605)  . sodium chloride Stopped (09/04/17 1605)  . albumin human    .  ertapenem Stopped (09/07/17 1510)  . famotidine (PEPCID) IV Stopped (09/07/17 2144)  . lactated ringers 75 mL/hr at 09/08/17 0800  . norepinephrine (LEVOPHED) Adult infusion 5 mcg/min (09/08/17 0928)    PRN Meds: sodium chloride, acetaminophen **OR** acetaminophen, hydrocortisone-pramoxine, HYDROmorphone (DILAUDID) injection, LORazepam, ondansetron **OR** ondansetron (ZOFRAN) IV, oxyCODONE, polyvinyl alcohol, sodium chloride flush, sodium chloride flush, traMADol  Physical Exam  Constitutional: No distress.  Pulmonary/Chest: Effort normal.  Neurological: She is alert.  Skin: Skin is warm and dry.  Vital Signs: BP (!) 120/52   Pulse 93   Temp 98 F (36.7 C) (Oral)   Resp (!) 23   Ht 5\' 2"  (1.575 m)   Wt 83.3 kg (183 lb 10.3 oz)   SpO2 98%   BMI 33.59 kg/m  SpO2: SpO2: 98 % O2 Device: O2 Device: Room Air O2 Flow Rate: O2 Flow Rate (L/min): 2 L/min  Intake/output summary:   Intake/Output Summary (Last 24 hours) at 09/08/2017 0950 Last data filed at 09/08/2017 0800 Gross per 24 hour  Intake 3202.84 ml  Output 5075 ml  Net -1872.16 ml   LBM: Last BM Date: 09/03/17 Baseline Weight: Weight: 93 kg (205 lb) Most recent weight: Weight: 83.3 kg (183 lb 10.3 oz)       Palliative Assessment/Data: 40%    Flowsheet Rows     Most Recent Value  Intake Tab  Referral Department  Hospitalist  Unit at Time of Referral  Cardiac/Telemetry Unit  Palliative Care Primary Diagnosis  Other (Comment)  Date Notified  09/03/17  Palliative Care Type  New Palliative care  Reason for referral  Clarify Goals of Care  Date of Admission  09/01/17  Date first seen by Palliative Care  09/03/17  # of days Palliative referral response time  0 Day(s)  # of days IP prior to Palliative referral  2  Clinical Assessment  Palliative Performance Scale Score  30%  Pain Max last 24 hours  Not able to report  Pain Min Last 24 hours  Not able to report  Dyspnea Max Last 24 Hours  Not able to  report  Dyspnea Min Last 24 hours  Not able to report  Psychosocial & Spiritual Assessment  Palliative Care Outcomes  Patient/Family meeting held?  No [no family at bedside, emergency surgery]      Patient Active Problem List   Diagnosis Date Noted  . Encounter for central line placement   . Palliative care by specialist   . Goals of care, counseling/discussion   . DNR (do not resuscitate) discussion   . Hypovolemic shock ([place])   . S/P partial colectomy 09/03/2017  . Rectal bleeding   . Ascites   . Abnormal CT of the abdomen 07/23/2017  . Decreased appetite 07/23/2017  . Nausea without vomiting 07/23/2017  . Dark stools 07/23/2017  . Hypothyroidism 06/30/2017  . Diarrhea 06/30/2017  . Volume overload 06/30/2017  . Cirrhosis of liver with ascites ([place]) 06/30/2017  . Anasarca   . Liver cirrhosis secondary to NASH ([place]) 06/23/2015  . Spinal stenosis of lumbar region 01/25/2014  . PUD (peptic ulcer disease) 11/18/2013  . Undiagnosed cardiac murmurs 11/18/2013  . Osteopenia 07/20/2013  . Vaginal atrophy 07/20/2013  . Platelet disorder(CLUMPING) 12/28/2012  . Dermatitis perineal 12/28/2012  . Splenomegaly 09/23/2012  . 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 Non-alcoholic micronodular cirrhosis of liver ([place]) 02/25/2012  . Thrombocytopenia ([place]) 02/19/2012  . Fibromyalgia 02/04/2012  . Hyperlipidemia 02/04/2012  . Arthritis 02/04/2012  . GERD (gastroesophageal reflux disease) 02/04/2012    Palliative Care Assessment & Plan   Patient Profile:  77 y.o. female  with past medical history of [name] cirrhosis, anxiety and depression, fibromyalgia, obesity, thrombocytopenia, GERD, hypercholesterolemia and hypothyroidism, colonoscopy 6/5 with bowel perf, in emergency surgery rest of day admitted on 09/01/2017 with anasarca and ascites and GI bleed.    Assessment/ Recommendations/Plan:  Agree with Ultram for moderate pain 50mg . Could increase to 100mg  q 6  hours if needed.  Would recommend Oxycodone for  indication of severe pain 2.5-5mg  q 4-6 hours.      Code Status:    Code Status Orders  (From admission, onward)        Start     Ordered   09/03/17 1751  Do not attempt resuscitation (DNR)  Continuous    Question Answer Comment  In the event of cardiac or respiratory ARREST Do not call a "code blue"   In the event of cardiac or respiratory ARREST Do not perform Intubation, CPR, defibrillation or ACLS   In the event of cardiac or respiratory ARREST Use medication by any route, position, wound care, and other measures to relive pain and suffering. May use oxygen, suction and manual treatment of airway obstruction as needed for comfort.      09/03/17 1750    Code Status History    Date Active Date Inactive Code Status Order ID Comments User Context   09/01/2017 1629 09/03/2017 1750 Full Code [id number]  Barton Dubois, MD Inpatient   07/23/2017 2112 07/29/2017 1921 Full Code [id number]  Rodena Goldmann, DO Inpatient   06/30/2017 0054 07/02/2017 1933 Full Code [id number]  Heath Lark D, DO ED       Prognosis:  < 6 months  Discharge Planning:  To Be Determined  Care plan was discussed with RN  Thank you for allowing the Palliative Medicine Team to assist in the care of this patient.   Total Time 35 min Prolonged Time Billed  no      Greater than 50%  of this time was spent counseling and coordinating care related to the above assessment and plan.  Asencion Gowda, NP  Please contact Palliative Medicine Team phone at [phone] for questions and concerns.

## 2017-09-09 DIAGNOSIS — K921 Melena: Secondary | ICD-10-CM

## 2017-09-09 DIAGNOSIS — E440 Moderate protein-calorie malnutrition: Secondary | ICD-10-CM

## 2017-09-09 DIAGNOSIS — R601 Generalized edema: Secondary | ICD-10-CM

## 2017-09-09 LAB — CBC WITH DIFFERENTIAL/PLATELET
Abs Immature Granulocytes: 0 10*3/uL (ref 0.0–0.1)
BASOS PCT: 0 %
Basophils Absolute: 0 10*3/uL (ref 0.0–0.1)
EOS ABS: 0.1 10*3/uL (ref 0.0–0.7)
EOS PCT: 1 %
HCT: 25.3 % — ABNORMAL LOW (ref 36.0–46.0)
Hemoglobin: 8.8 g/dL — ABNORMAL LOW (ref 12.0–15.0)
Immature Granulocytes: 0 %
Lymphocytes Relative: 17 %
Lymphs Abs: 1.5 10*3/uL (ref 0.7–4.0)
MCH: 34.5 pg — AB (ref 26.0–34.0)
MCHC: 34.8 g/dL (ref 30.0–36.0)
MCV: 99.2 fL (ref 78.0–100.0)
MONO ABS: 0.8 10*3/uL (ref 0.1–1.0)
Monocytes Relative: 9 %
NEUTROS PCT: 73 %
Neutro Abs: 6.5 10*3/uL (ref 1.7–7.7)
PLATELETS: 68 10*3/uL — AB (ref 150–400)
RBC: 2.55 MIL/uL — ABNORMAL LOW (ref 3.87–5.11)
RDW: 13.3 % (ref 11.5–15.5)
WBC: 9 10*3/uL (ref 4.0–10.5)

## 2017-09-09 LAB — BASIC METABOLIC PANEL
Anion gap: 6 (ref 5–15)
BUN: 19 mg/dL (ref 6–20)
CALCIUM: 9 mg/dL (ref 8.9–10.3)
CO2: 21 mmol/L — AB (ref 22–32)
Chloride: 111 mmol/L (ref 101–111)
Creatinine, Ser: 1.04 mg/dL — ABNORMAL HIGH (ref 0.44–1.00)
GFR calc Af Amer: 59 mL/min — ABNORMAL LOW (ref >60)
GFR, EST NON AFRICAN AMERICAN: 51 mL/min — AB (ref >60)
Glucose, Bld: 100 mg/dL — ABNORMAL HIGH (ref 65–99)
Potassium: 4.3 mmol/L (ref 3.5–5.1)
Sodium: 138 mmol/L (ref 135–145)

## 2017-09-09 MED ORDER — MIDODRINE HCL 5 MG PO TABS
10.0000 mg | ORAL_TABLET | Freq: Three times a day (TID) | ORAL | Status: DC
  Administered 2017-09-09 – 2017-09-10 (×5): 10 mg via ORAL
  Filled 2017-09-09 (×5): qty 2

## 2017-09-09 MED ORDER — BOOST / RESOURCE BREEZE PO LIQD CUSTOM
1.0000 | Freq: Three times a day (TID) | ORAL | Status: DC
  Administered 2017-09-09 – 2017-09-10 (×5): 1 via ORAL

## 2017-09-09 NOTE — Progress Notes (Signed)
Initial Nutrition Assessment  DOCUMENTATION CODES:   Non-severe (moderate) malnutrition in context of chronic illness  INTERVENTION:  Boost Breeze po TID, each supplement provides 250 kcal and 9 grams of protein RD to monitor diet advancement and order supplements as necessary RD to monitor PO intake Monitor GOC  NUTRITION DIAGNOSIS:   Moderate Malnutrition related to chronic illness(Cirrhosis) as evidenced by moderate fat depletion, moderate muscle depletion, energy intake < 75% for > or equal to 1 month.  GOAL:   Patient will meet greater than or equal to 90% of their needs  MONITOR:   PO intake, Supplement acceptance, Skin, Weight trends, Labs, I & O's  REASON FOR ASSESSMENT:   Malnutrition Screening Tool    ASSESSMENT:   77 y.o. F admitted to [facility] on 09/01/17 for anasarca, generalized weakness, and worsening SOB and transferred to [facility] 6/6 for vascular surgery. PMH of NASH cirrhosis, depression, GERD, peptic ulcer disease, hypothyroidism, fibromyalgia, and hx of multiple fractures. Previous admission end of April 2019 for anasarca and has had multiple paracentesis in April; 4/1: -2.8 L, 4/25: - 2.5 L. Pt underwent colostomy and esophagogastroduodenoscopy 6/4 due to rectal bleeding, nausea, and hx of cirrhosis. Pt with colonic perforation from colonoscopy 6/4 and had laparotomy to repair with partial colectomy 6/5. 6/6 pt had arterial line removed as it was malpositioned.   Discharge weight 07/29/17: 210 lbs per pt discharge note. EDW unknown. Pt current weight 172 lbs; 18% weight loss in < 2 months - significant. Pt weight day before 183 lbs; 13% weight loss in < 2 months - significant. Both weights show significant weight loss, however, unsure if discharge weight is a true dry weight.   Pt reports that she was 105 lbs "a while ago, but I gained the weight back, the doctor told me it was fluid". Pt reports her UBW was 301 lbs a year ago. Pt reports a typical meal is a banana,  crackers, and an egg. Pt reports eating 3 meals a day. Pt says she does not have an appetite and couldn't specify how long she has been eating like this past "a while" and says it has been over a month.   Per palliative care note will talk about GOC tomorrow.  Per Surgery PA note pt with prolonged ileus; if no bowel function returns, consider TPN.  Pt without BM > 5 days. PO intake unknown; per chart no data input past 6/4.   Medications reviewed: levothyroxine, invanz.   Labs reviewed: CO2 21 (L), BG 100 (H), creatinine 1.04 (H), GFR 51 (L), RBC 2.55 (L), hemoglobin 8.8 (L), HCT 25.3 (L), platelets 68 (L).   NUTRITION - FOCUSED PHYSICAL EXAM:    Most Recent Value  Orbital Region  Mild depletion  Upper Arm Region  Moderate depletion  Thoracic and Lumbar Region  No depletion  Buccal Region  Moderate depletion  Temple Region  Mild depletion  Clavicle Bone Region  Moderate depletion  Clavicle and Acromion Bone Region  No depletion  Scapular Bone Region  Unable to assess  Dorsal Hand  Moderate depletion  Patellar Region  No depletion  Anterior Thigh Region  No depletion  Posterior Calf Region  Mild depletion  Edema (RD Assessment)  Mild  Hair  Reviewed  Eyes  Reviewed  Mouth  Reviewed  Skin  Reviewed  Nails  Reviewed     Diet Order:   Diet Order           Diet clear liquid Room service appropriate? Yes;  Fluid consistency: Thin  Diet effective now          EDUCATION NEEDS:   Education needs have been addressed  Skin:  Skin Assessment: Skin Integrity Issues: Skin Integrity Issues:: Incisions Incisions: chest, R neck  Last BM:  09/03/17  Height:   Ht Readings from Last 1 Encounters:  09/04/17 5\' 2"  (1.575 m)    Weight:   Wt Readings from Last 1 Encounters:  09/09/17 172 lb 9.9 oz (78.3 kg)    Ideal Body Weight:  50 kg  BMI:  Body mass index is 31.57 kg/m.  Estimated Nutritional Needs:   Kcal:  1900-2100 kcal  Protein:  100-115 grams  Fluid:  >/= 1.9 L  or per MD   Hope Budds, Dietetic Intern

## 2017-09-09 NOTE — Progress Notes (Addendum)
Daily Progress Note   Patient Name: [name]       Date: 09/09/2017 DOB: 18-Jun-1940  Age: 77 y.o. MRN#: [id number] Attending Physician: Sampson Goon, MD Primary Care Physician: Sharilyn Sites, MD Admit Date: 09/01/2017  Reason for Consultation/Follow-up: Establishing goals of care and Psychosocial/spiritual support  Subjective: Patient resting in bed, no family at bedside. She is asking when she can be discharged. Abdominal pain is improving. Per conversations with CCM, plans to D/C Levophed, plans to increase Midodrine and discontinuing femoral line with plans to mobilize patient. Drain continues to have high output.  Spoke with surgery yesterday regarding possibility of continuing high output drain for symptom management should family decide on hospice. Drain to be removed tomorrow as without IV fluids she would have life threatening dehydration in a matter of days. Anticipate quick reaccumulation requiring paracentesis. Could place pleurex if patient decides on hospice.  Called to bedside as son has arrived. His mother is feeling a bit better. He would like to have a family meeting tomorrow to discuss [place] and plan moving forward.    RN to page with time for family meeting tomorrow. Will continue to follow along.      Length of Stay: 8  Current Medications: Scheduled Meds:  . Chlorhexidine Gluconate Cloth  6 each Topical Once  . Chlorhexidine Gluconate Cloth  6 each Topical Daily  . levothyroxine  100 mcg Oral QAC breakfast  . midodrine  10 mg Oral TID WC  . nystatin cream   Topical BID  . sodium chloride flush  10-40 mL Intracatheter Q12H  . sodium chloride flush  3 mL Intravenous Q12H  . sodium chloride flush  3 mL Intravenous Q12H    Continuous Infusions: . sodium  chloride Stopped (09/04/17 1605)  . sodium chloride Stopped (09/04/17 1605)  . ertapenem Stopped (09/08/17 1402)    PRN Meds: sodium chloride, acetaminophen **OR** acetaminophen, hydrocortisone-pramoxine, HYDROmorphone (DILAUDID) injection, LORazepam, ondansetron **OR** ondansetron (ZOFRAN) IV, polyvinyl alcohol, sodium chloride flush, sodium chloride flush, traMADol  Physical Exam  Constitutional: No distress.  Pulmonary/Chest: Effort normal.  Neurological: She is alert.  Skin: Skin is warm and dry.            Vital Signs: BP 90/78   Pulse 94   Temp 97.7 F (36.5 C) (Oral)   Resp  20   Ht 5\' 2"  (1.575 m)   Wt 78.3 kg (172 lb 9.9 oz)   SpO2 100%   BMI 31.57 kg/m  SpO2: SpO2: 100 % O2 Device: O2 Device: Room Air O2 Flow Rate: O2 Flow Rate (L/min): 2 L/min  Intake/output summary:   Intake/Output Summary (Last 24 hours) at 09/09/2017 0910 Last data filed at 09/09/2017 0800 Gross per 24 hour  Intake 2063.9 ml  Output 3375 ml  Net -1311.1 ml   LBM: Last BM Date: 09/03/17 Baseline Weight: Weight: 93 kg (205 lb) Most recent weight: Weight: 78.3 kg (172 lb 9.9 oz)       Palliative Assessment/Data: 40%    Flowsheet Rows     Most Recent Value  Intake Tab  Referral Department  Hospitalist  Unit at Time of Referral  Cardiac/Telemetry Unit  Palliative Care Primary Diagnosis  Other (Comment)  Date Notified  09/03/17  Palliative Care Type  New Palliative care  Reason for referral  Clarify Goals of Care  Date of Admission  09/01/17  Date first seen by Palliative Care  09/03/17  # of days Palliative referral response time  0 Day(s)  # of days IP prior to Palliative referral  2  Clinical Assessment  Palliative Performance Scale Score  30%  Pain Max last 24 hours  Not able to report  Pain Min Last 24 hours  Not able to report  Dyspnea Max Last 24 Hours  Not able to report  Dyspnea Min Last 24 hours  Not able to report  Psychosocial & Spiritual Assessment  Palliative Care  Outcomes  Patient/Family meeting held?  No [no family at bedside, emergency surgery]      Patient Active Problem List   Diagnosis Date Noted  . Encounter for central line placement   . Palliative care by specialist   . Goals of care, counseling/discussion   . DNR (do not resuscitate) discussion   . Hypovolemic shock ([place])   . S/P partial colectomy 09/03/2017  . Rectal bleeding   . Ascites   . Abnormal CT of the abdomen 07/23/2017  . Decreased appetite 07/23/2017  . Nausea without vomiting 07/23/2017  . Dark stools 07/23/2017  . Hypothyroidism 06/30/2017  . Diarrhea 06/30/2017  . Volume overload 06/30/2017  . Cirrhosis of liver with ascites ([place]) 06/30/2017  . Anasarca   . Liver cirrhosis secondary to NASH ([place]) 06/23/2015  . Spinal stenosis of lumbar region 01/25/2014  . PUD (peptic ulcer disease) 11/18/2013  . Undiagnosed cardiac murmurs 11/18/2013  . Osteopenia 07/20/2013  . Vaginal atrophy 07/20/2013  . Platelet disorder(CLUMPING) 12/28/2012  . Dermatitis perineal 12/28/2012  . Splenomegaly 09/23/2012  . 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 Non-alcoholic micronodular cirrhosis of liver ([place]) 02/25/2012  . Thrombocytopenia ([place]) 02/19/2012  . Fibromyalgia 02/04/2012  . Hyperlipidemia 02/04/2012  . Arthritis 02/04/2012  . GERD (gastroesophageal reflux disease) 02/04/2012    Palliative Care Assessment & Plan   Patient Profile:  77 y.o. female  with past medical history of [name] cirrhosis, anxiety and depression, fibromyalgia, obesity, thrombocytopenia, GERD, hypercholesterolemia and hypothyroidism, colonoscopy 6/5 with bowel perf, in emergency surgery rest of day admitted on 09/01/2017 with anasarca and ascites and GI bleed.    Assessment/ Recommendations/Plan:  Continue care. Ongoing discussions with patient and family regarding this hospitalization in the larger context of her cirrhosis.      Code Status:    Code Status Orders    (From admission, onward)  Start     Ordered   09/03/17 1751  Do not attempt resuscitation (DNR)  Continuous    Question Answer Comment  In the event of cardiac or respiratory ARREST Do not call a "code blue"   In the event of cardiac or respiratory ARREST Do not perform Intubation, CPR, defibrillation or ACLS   In the event of cardiac or respiratory ARREST Use medication by any route, position, wound care, and other measures to relive pain and suffering. May use oxygen, suction and manual treatment of airway obstruction as needed for comfort.      09/03/17 1750    Code Status History    Date Active Date Inactive Code Status Order ID Comments User Context   09/01/2017 1629 09/03/2017 1750 Full Code [id number]  Barton Dubois, MD Inpatient   07/23/2017 2112 07/29/2017 1921 Full Code [id number]  Rodena Goldmann, DO Inpatient   06/30/2017 0054 07/02/2017 1933 Full Code [id number]  Heath Lark D, DO ED       Prognosis:  < 6 months  Discharge Planning:  To Be Determined  Care plan was discussed with RN  Thank you for allowing the Palliative Medicine Team to assist in the care of this patient.   Total Time 35 min Prolonged Time Billed  no      Greater than 50%  of this time was spent counseling and coordinating care related to the above assessment and plan.  Asencion Gowda, NP  Please contact Palliative Medicine Team phone at [phone] for questions and concerns.

## 2017-09-09 NOTE — Progress Notes (Signed)
Follow up - Critical Care Medicine Note  Patient Details:    Melanie Porter is an 77 y.o. female.  She has known [name] cirrhosis and has been experiencing a progressive functional decline characterized by increasing weakness and loss of appetite with weight loss over the last several months.  She unfortunately suffered a colonic perforation following colonoscopy performed presumably to investigate a history of bright red blood per rectum.  She underwent laparotomy which was in turn complicated by an arterial line placement.    Lines, Airways, Drains: CVC Triple Lumen 09/03/17 Right Femoral (Active)  Indication for Insertion or Continuance of Line Vasoactive infusions 09/08/2017  8:00 AM  Site Assessment Clean;Dry;Intact 09/08/2017  8:00 AM  Proximal Lumen Status Infusing 09/08/2017  8:00 AM  Medial Lumen Status Infusing 09/08/2017  8:00 AM  Distal Lumen Status Infusing 09/08/2017  8:00 AM  Dressing Type Transparent;Occlusive 09/08/2017  8:00 AM  Dressing Status Clean;Dry;Intact;Antimicrobial disc in place 09/08/2017  8:00 AM  Line Care Connections checked and tightened 09/08/2017  8:00 AM  Dressing Intervention New dressing;Dressing changed;Antimicrobial disc changed;Securement device changed 09/04/2017  8:00 AM  Dressing Change Due 09/11/17 09/08/2017  8:00 AM     Arterial Line 09/04/17 Radial (Active)  Site Assessment Clean;Dry;Intact 09/08/2017  8:00 AM  Line Status Pulsatile blood flow 09/08/2017  8:00 AM  Art Line Waveform Appropriate 09/08/2017  8:00 AM  Art Line Interventions Zeroed and calibrated 09/08/2017  8:00 AM  Color/Movement/Sensation Capillary refill less than 3 sec 09/08/2017  8:00 AM  Dressing Type Transparent;Occlusive 09/08/2017  8:00 AM  Dressing Status Clean;Dry;Intact;Antimicrobial disc in place 09/08/2017  8:00 AM  Dressing Change Due 09/11/17 09/08/2017  8:00 AM     Closed System Drain 1 Right;Lateral Abdomen Bulb (JP) (Active)  Site Description Unable to view 09/08/2017  8:00 AM   Dressing Status Clean;Dry;Intact 09/08/2017  8:00 AM  Drainage Appearance Serosanguineous 09/08/2017  8:00 AM  Status To gravity (Uncharged) 09/08/2017  8:00 AM  Intake (mL) 300 ml 09/08/2017  2:02 PM  Output (mL) 600 mL 09/08/2017  6:59 AM     External Urinary Catheter (Active)  Collection Container Dedicated Suction Canister 09/08/2017  8:00 AM  Securement Method Tape 09/08/2017  6:00 AM  Intervention Equipment Changed 09/08/2017  6:00 AM  Output (mL) 25 mL 09/08/2017 10:00 AM    Anti-infectives:  Anti-infectives (From admission, onward)   Start     Dose/Rate Route Frequency Ordered Stop   09/06/17 1400  ertapenem (INVANZ) 1 g in sodium chloride 0.9 % 100 mL IVPB  Status:  Discontinued     1 g 200 mL/hr over 30 Minutes Intravenous Every 24 hours 09/06/17 0344 09/09/17 1153   09/04/17 1200  ertapenem (INVANZ) 1 g in sodium chloride 0.9 % 50 mL IVPB  Status:  Discontinued     1 g 100 mL/hr over 30 Minutes Intravenous Every 24 hours 09/03/17 1720 09/06/17 0344   09/03/17 2200  piperacillin-tazobactam (ZOSYN) IVPB 3.375 g  Status:  Discontinued     3.375 g 12.5 mL/hr over 240 Minutes Intravenous Every 8 hours 09/03/17 1801 09/03/17 1815   09/03/17 1245  ertapenem (INVANZ) 1 g in sodium chloride 0.9 % 100 mL IVPB     1 g 200 mL/hr over 30 Minutes Intravenous  Once 09/03/17 1239 09/03/17 1310      Microbiology: Results for orders placed or performed during the hospital encounter of 09/01/17  MRSA PCR Screening     Status: None   Collection Time: 09/03/17  5:18 PM  Result Value Ref Range Status   MRSA by PCR NEGATIVE NEGATIVE Final    Comment:        The GeneXpert MRSA Assay (FDA approved for NASAL specimens only), is one component of a comprehensive MRSA colonization surveillance program. It is not intended to diagnose MRSA infection nor to guide or monitor treatment for MRSA infections. Performed at [facility], [address], [place], [place] [address]   Surgical pcr screen      Status: None   Collection Time: 09/04/17 11:29 AM  Result Value Ref Range Status   MRSA, PCR NEGATIVE NEGATIVE Final   Staphylococcus aureus NEGATIVE NEGATIVE Final    Comment: (NOTE) The Xpert SA Assay (FDA approved for NASAL specimens in patients 75 years of age and older), is one component of a comprehensive surveillance program. It is not intended to diagnose infection nor to guide or monitor treatment. Performed at [facility], [place] [address], [place], [place] [address]     Best Practice/Protocols:  VTE Prophylaxis: None due to thrombocytopenia  Events: Extubated 6/10.  Norepinephrine weaned off 6/11  Studies: Dg Chest 2 View  Result Date: 09/01/2017 CLINICAL DATA:  Coughing and shortness of breath over the last 2 weeks. EXAM: CHEST - 2 VIEW COMPARISON:  07/23/2017.  06/29/2017. FINDINGS: Poor inspiration or low lung volumes. Heart size is normal. Chronic aortic atherosclerosis. Bronchial thickening pattern consistent with bronchitis, but no consolidation or lobar collapse. No effusions. Chronic degenerative changes affect the spine. Old thoracolumbar compression fractures. IMPRESSION: Possible bronchitis. No consolidation or collapse. Poor inspiration versus low lung volumes. Electronically Signed   By: Nelson Chimes M.D.   On: 09/01/2017 12:27   Ct Chest W Contrast  Result Date: 09/03/2017 CLINICAL DATA:  Central line placement. EXAM: CT CHEST WITH CONTRAST TECHNIQUE: Multidetector CT imaging of the chest was performed during intravenous contrast administration. CONTRAST:  75 mL ISOVUE-300 IOPAMIDOL (ISOVUE-300) INJECTION 61% COMPARISON:  Chest radiograph 09/03/2017 FINDINGS: Cardiovascular: Right-sided vascular catheter traverses the right internal jugular vein and enters the right common carotid artery, with tip in the proximal aortic arch. There is atherosclerotic calcification in the aorta. Mild cardiomegaly. There is soft tissue edema in the right neck, extending into  the upper right chest wall and axilla. Mediastinum/Nodes: No mediastinal, hilar or axillary lymphadenopathy. The visualized thyroid and thoracic esophageal course are unremarkable. Lungs/Pleura: Lungs are clear. No pleural effusion or pneumothorax. Upper Abdomen: There is a large amount of free air in the anterior abdomen. The liver is diffusely nodular. Small volume ascites. Musculoskeletal: No chest wall abnormality. No acute or significant osseous findings. IMPRESSION: 1. Right-sided vascular access catheter is intra arterial with tip in the aortic arch. The catheter traverses the right internal jugular vein and enters the right common carotid artery before continuing inferiorly to the aortic arch. There is surrounding soft tissue edema that extends into the right upper chest wall and axilla. 2. Large volume pneumoperitoneum, consistent with recent perforation and subsequent abdominal surgery. 3.  Aortic Atherosclerosis (ICD10-I70.0). Critical Value/emergent results were called by telephone at the time of interpretation on 09/03/2017 at 10:35 pm to Dr. Aviva Signs , who verbally acknowledged these results. Electronically Signed   By: Ulyses Jarred M.D.   On: 09/03/2017 22:35   US Paracentesis  Result Date: 09/01/2017 INDICATION: Recurrent ascites, cirrhosis due to NASH EXAM: ULTRASOUND GUIDED THERAPEUTIC PARACENTESIS MEDICATIONS: None. COMPLICATIONS: None immediate. PROCEDURE: Procedure, benefits, and risks of procedure were discussed with patient. Written informed consent for  procedure was obtained. Time out protocol followed. Adequate collection of ascites localized by ultrasound in RIGHT lower quadrant. Skin prepped and draped in usual sterile fashion. Skin and soft tissues anesthetized with 10 mL of 1% lidocaine. 5 [place] Yueh catheter placed into peritoneal cavity. 5 L of clear yellow ascitic fluid aspirated by vacuum bottle suction. Procedure tolerated well by patient without immediate complication.  FINDINGS: As above IMPRESSION: Successful ultrasound-guided paracentesis yielding 5 liters of peritoneal fluid. Electronically Signed   By: Lavonia Dana M.D.   On: 09/01/2017 15:58   Dg Chest Port 1 View  Result Date: 09/03/2017 CLINICAL DATA:  Central line placement. EXAM: PORTABLE CHEST 1 VIEW COMPARISON:  Radiograph of September 01, 2017. FINDINGS: The heart size and mediastinal contours are within normal limits. Both lungs are clear. Hypoinflation of the lungs is noted. No pneumothorax or pleural effusion is noted. Interval placement of right-sided neck catheter. The tip is positioned more medially than expected and is concerning for possible arterial puncture. The visualized skeletal structures are unremarkable. IMPRESSION: Hypoinflation of the lungs. Interval placement of right-sided neck catheter. The distal tip is significantly more medial than would be expected for superior vena cava and internal jugular venous access. The possibility of inadvertent arterial catheterization with tip in aorta cannot be excluded. Clinical correlation is recommended as well as further evaluation with CT scan. These results will be called to the ordering clinician or representative by the Radiologist Assistant, and communication documented in the PACS or zVision Dashboard. Electronically Signed   By: Marijo Conception, M.D.   On: 09/03/2017 18:03    Consults: Treatment Team:  Danie Binder, MD   Subjective:    Overnight Issues: No reported complaints apart from feeling tired.  Objective:  Vital signs for last 24 hours: Temp:  [97.5 F (36.4 C)-98.1 F (36.7 C)] 98.1 F (36.7 C) (06/11 1216) Pulse Rate:  [82-102] 92 (06/11 1300) Resp:  [14-22] 18 (06/11 1300) BP: (87-127)/(43-78) 96/43 (06/11 1300) SpO2:  [96 %-100 %] 98 % (06/11 1300) Arterial Line BP: (83-118)/(41-54) 98/50 (06/11 0900) Weight:  [172 lb 9.9 oz (78.3 kg)] 172 lb 9.9 oz (78.3 kg) (06/11 0418)  Hemodynamic parameters for last 24 hours:   Norepinephrine discontinued at 0500  Intake/Output from previous day: 06/10 0701 - 06/11 0700 In: 2005.7 [I.V.:1805.7; IV Piggyback:200] Out: 3450 [Urine:700; Drains:2750]  Intake/Output this shift: Total I/O In: 585 [P.O.:360; I.V.:225] Out: -   Vent settings for last 24 hours:  Extubated  Physical Exam:  General: alert and no respiratory distress Neuro: alert, oriented and nonfocal exam HEENT/Neck: no JVD Resp: clear to auscultation bilaterally CVS: regular rate and rhythm, S1, S2 normal, no murmur, click, rub or gallop.  Extremities are warm GI: wound clean and Ongoing drainage of ascitic fluid Skin: no rash Extremities: no edema, no erythema, pulses WNL and edema 2+  Assessment/Plan:   NEURO  Currently somnolent.  At risk for hepatic encephalopathy.   Plan: Consider ammonia level formally rule out hepatic encephalopathy.  Hypovolemia may also be contributing.  PULM  No pulmonary issues.   Plan: Mobilize as possible  CARDIO  Dehydration and Hypotension.  Cirrhosis may also impair clearance of bacterial mediated vasodilators.   Plan: Replete volume with albumin.  Increase Midodrin  RENAL  Appears volume contracted   Plan: Gentle fluid resuscitation  GI  Lower GI Bleeding (Appears to have stopped) End-stage.  Loss of weight is a poor prognostic factor.   Plan: Engage in ongoing end of care  discussions.  ID  Has completed treatment for aspiration.  Ongoing low blood pressure likely related to hepatic disease rather than active infection.   Stop all antibiotics  HEME  Thrombocytopenia (due to splenic sequestration)   Plan: Hold chemical DVT prophylaxis  ENDO Currently euglycemic   Plan: Continue current diabetes management strategy.  Global Issues   Overall prognosis is poor given cirrhosis with no transplantation option and progressive functional decline.  Appreciate palliative care's input and would see gradually transitioning this patient to comfort care.   LOS:  8 days   Additional comments:DNR and no intubation.  Ready for transfer to medical service.  Critical Care Total Time*: 30 Minutes  Ahlani Wickes 09/09/2017  *Care during the described time interval was provided by me and/or other providers on the critical care team.  I have reviewed this patient's available data, including medical history, events of note, physical examination and test results as part of my evaluation.

## 2017-09-09 NOTE — Progress Notes (Signed)
[place] Progress Note Patient Name: TIAMARIE FURNARI DOB: 1940/12/03 MRN: [id number]   Date of Service  09/09/2017  HPI/Events of Note  No AM labs ordered.   eICU Interventions  Will order: 1.CBC and BMP at 5 AM.     Intervention Category Major Interventions: Other:  Sommer,Steven Cornelia Copa 09/09/2017, 4:04 AM

## 2017-09-09 NOTE — Progress Notes (Addendum)
[facility]/Trauma Progress Note  5 Days Post-Op   Assessment/Plan Principal Problem: Anasarca Active Problems: Hyperlipidemia Thrombocytopenia (HCC) PUD (peptic ulcer disease) Liver cirrhosis secondary to NASH (HCC) Hypothyroidism Cirrhosis of liver with ascites (HCC) Ascites Rectal bleeding S/P partial colectomy Encounter for central line placement Palliative care by specialist Goals of care, counseling/discussion DNR (do not resuscitate) discussion Hypovolemic shock ([place])  S/P exploratory laparotomy, partial colectomy, Dr. Arnoldo Morale, 06/05, POD 3 -on clears, not taking in much, added breeze - pulm toilet with IS - needs to mobilize. - continue surgical drain until tomorrow, high output likely 2/2 cirrhosis. Fluid is serosanguinous.   XFG:HWEXHB until BM or flatus, breeze ZJI:RCVE ID: [id number] 06/06 Foley:purewick Follow up: Dr. Arnoldo Morale  Plan: Will pull surgical drain tomorrow. Ambulate. PT consult pending. It bowel function does not return within a day or two will need to consider TPN in setting of prolonged ileus.     LOS: 8 days    Subjective: CC; abdominal pain  Pain unchanged, not worse. Mild nausea. Did vomit up jello yesterday morning. She states no flatus or BM. Son at bedside and discussed pulling surgical drain tomorrow. Encouraged ambulation. Nurse states they are removing femoral line today. Pt states she is ambulatory at baseline.   Objective: Vital signs in last 24 hours: Temp:  [97.5 F (36.4 C)-97.8 F (36.6 C)] 97.7 F (36.5 C) (06/11 0749) Pulse Rate:  [82-102] 94 (06/11 0900) Resp:  [14-22] 20 (06/11 0900) BP: (87-127)/(44-78) 90/78 (06/11 0900) SpO2:  [96 %-100 %] 100 % (06/11 0900) Arterial Line BP: (83-129)/(38-54) 98/50 (06/11 0900) Weight:  [78.3 kg (172 lb 9.9 oz)] 78.3 kg (172 lb 9.9 oz) (06/11 0418) Last BM Date: 09/03/17  Intake/Output from previous day: 06/10 0701 - 06/11  0700 In: 2005.7 [I.V.:1805.7; IV Piggyback:200] Out: 3450 [Urine:700; Drains:2750] Intake/Output this shift: Total I/O In: 225 [I.V.:225] Out: -   PE: Gen:  Alert, NAD, pleasant, cooperative Pulm:  Rate and effort normal Abd: Soft, not distended, hypoactive BS, midline incision with staples and is without signs of infection. drain with high output serosanguinous drainage, mild TTP on left side, no guarding, no signs of peritonitis Skin: warm and dry  Anti-infectives: Anti-infectives (From admission, onward)   Start     Dose/Rate Route Frequency Ordered Stop   09/06/17 1400  ertapenem (INVANZ) 1 g in sodium chloride 0.9 % 100 mL IVPB     1 g 200 mL/hr over 30 Minutes Intravenous Every 24 hours 09/06/17 0344     09/04/17 1200  ertapenem (INVANZ) 1 g in sodium chloride 0.9 % 50 mL IVPB  Status:  Discontinued     1 g 100 mL/hr over 30 Minutes Intravenous Every 24 hours 09/03/17 1720 09/06/17 0344   09/03/17 2200  piperacillin-tazobactam (ZOSYN) IVPB 3.375 g  Status:  Discontinued     3.375 g 12.5 mL/hr over 240 Minutes Intravenous Every 8 hours 09/03/17 1801 09/03/17 1815   09/03/17 1245  ertapenem (INVANZ) 1 g in sodium chloride 0.9 % 100 mL IVPB     1 g 200 mL/hr over 30 Minutes Intravenous  Once 09/03/17 1239 09/03/17 1310      Lab Results:  Recent Labs    09/08/17 0414 09/09/17 0413  WBC 6.3 9.0  HGB 8.7* 8.8*  HCT 25.5* 25.3*  PLT 66* 68*   BMET Recent Labs    09/08/17 0414 09/09/17 0413  NA 138 138  K 4.1 4.3  CL 110 111  CO2 22 21*  GLUCOSE 115*  100*  BUN 13 19  CREATININE 0.91 1.04*  CALCIUM 9.3 9.0   PT/INR No results for input(s): LABPROT, INR in the last 72 hours. CMP     Component Value Date/Time   NA 138 09/09/2017 0413   K 4.3 09/09/2017 0413   CL 111 09/09/2017 0413   CO2 21 (L) 09/09/2017 0413   GLUCOSE 100 (H) 09/09/2017 0413   BUN 19 09/09/2017 0413   CREATININE 1.04 (H) 09/09/2017 0413   CREATININE 0.79 02/26/2017 1407   CALCIUM 9.0  09/09/2017 0413   PROT 5.1 (L) 09/06/2017 0352   PROT 7.2 09/16/2013   ALBUMIN 3.1 (L) 09/06/2017 0352   ALBUMIN 3.6 09/16/2013   AST 18 09/06/2017 0352   AST 44 09/16/2013   ALT 13 (L) 09/06/2017 0352   ALT 39 09/16/2013   ALKPHOS 55 09/06/2017 0352   ALKPHOS 146 09/16/2013   BILITOT 2.3 (H) 09/06/2017 0352   BILITOT 0.6 09/16/2013   GFRNONAA 51 (L) 09/09/2017 0413   GFRNONAA 83 05/12/2015 0905   GFRAA 59 (L) 09/09/2017 0413   GFRAA >89 05/12/2015 0905   Lipase     Component Value Date/Time   LIPASE 25 07/23/2017 1515    Studies/Results: No results found.    Kalman Drape , [facility] 09/09/2017, 9:52 AM  Pager: [phone] Mon-Wed, Friday 7:00am-4:30pm Thurs 7am-11:30am  Consults: [phone]

## 2017-09-09 NOTE — Progress Notes (Signed)
eLink Physician-Brief Progress Note Patient Name: Melanie Porter DOB: 26-Nov-1940 MRN: [id number]   Date of Service  09/09/2017  HPI/Events of Note  Oliguria - Patient feels like she has to void. Bladder scan --> 250 mL residual.   eICU Interventions  Will order: 1. I/O Cath PRN.      Intervention Category Intermediate Interventions: Oliguria - evaluation and management  Sommer,Steven Eugene 09/09/2017, 5:39 AM

## 2017-09-10 DIAGNOSIS — K631 Perforation of intestine (nontraumatic): Secondary | ICD-10-CM

## 2017-09-10 MED ORDER — ONDANSETRON 4 MG PO TBDP: 4.0000 mg | ORAL_TABLET | Freq: Four times a day (QID) | ORAL | 0 refills | Status: AC | PRN

## 2017-09-10 MED ORDER — MIDODRINE HCL 10 MG PO TABS: 10.0000 mg | ORAL_TABLET | Freq: Three times a day (TID) | ORAL | 1 refills | Status: AC

## 2017-09-10 MED ORDER — MORPHINE SULFATE 20 MG/5ML PO SOLN: 2.5000 mg | ORAL | 0 refills | Status: AC | PRN

## 2017-09-10 MED ORDER — ALUM & MAG HYDROXIDE-SIMETH 200-200-20 MG/5ML PO SUSP: 30.0000 mL | Freq: Four times a day (QID) | ORAL | Status: DC | PRN

## 2017-09-10 MED ORDER — LORAZEPAM 2 MG/ML PO CONC: 0.6000 mg | ORAL | 0 refills | Status: AC | PRN

## 2017-09-10 NOTE — Care Management Note (Addendum)
Case Management Note  Patient Details  Name: Melanie Porter MRN: [id number] Date of Birth: [birth year]  Subjective/Objective:                    Action/Plan: Son called DME has arrived and he is ready for his mom to come home. PTAR called estimated time PTAR will arrive is a hour and half from now. Junior aware.  Spoke with patient, patient's friend and son Paramedic .  Confirmed address in Epic.   Patient's cell number is [phone]  Daughter KNL [phone]  Junior ( son main contact person) [phone]   Confirmed plan is to discharge today to home with hospice. Patient lives alone but has assistance from adult children and friend currently at bedside.   Choice offered . Patient and family prefer [facility] phone [phone] fax [phone] .   Patient's PCP is Dr Sharilyn Sites.  Patient will require ambulance transport home and hospital bed and 3 in 1.   Patient and family would like discharge today.   Referral called and faxed to  to [facility].   Vito Backers will order hospital bed and 3 in 1 through [facility] and provide Junior's cell number to arrange delivery. Once hospital bed is in place at patient's home , Junior will call NCM or bedside nurse to call PTAR.   PTAR paperwork and phone numbers and gold DNR form in patient's shadow chart for nurse to call when hospital bed in place and ready for discharge.    Patient asking for hospice nurse to remove staples so she does not have to go to surgeons office. Cassandra aware and will confirm with her Director but believes hospice nurse will be able to remove staples . Surgery note regarding same faxed to [place].    Expected Discharge Date:                  Expected Discharge Plan:  [place]  In-House Referral:  Hospice / Palliative Care  Discharge planning Services  CM Consult  Post Acute Care Choice:  Hospice, Durable Medical  Equipment Choice offered to:  Patient, Adult Children  DME Arranged:  3-N-1, Hospital bed DME Agency:  [facility]  HH Arranged:  PT [facility] Agency:  [facility]  Status of Service:  In process, will continue to follow  If discussed at Long Length of Stay Meetings, dates discussed:    Additional Comments:  Marilu Favre, RN 09/10/2017, 1:00 PM

## 2017-09-10 NOTE — Evaluation (Signed)
Physical Therapy Evaluation and Discharge Patient Details Name: Melanie Porter MRN: [id number] DOB: 06/08/1940 Today's Date: 09/10/2017   History of Present Illness  Melanie Porter is an 77 y.o. female.  She has known Melanie Porter cirrhosis and has been experiencing a progressive functional decline characterized by increasing weakness and loss of appetite with weight loss over the last several months.  She unfortunately suffered a colonic perforation following colonoscopy performed presumably to investigate a history of bright red blood per rectum.  She underwent laparotomy which was in turn complicated by an arterial line placement.     Clinical Impression  Pt admitted with above diagnosis. Pt currently with functional limitations due to the deficits listed below (see PT Problem List). PTA, pt living home alone independent with household ambulation. Upon eval pt presents with high levels of weakness and fatigue, limited to bed level therex and transfers this visit. Educated family on proper assistance for bed mobility and therex, recommending HHPT to work with patients functional mobility/strength and reduce caregiver burden at home. PT scheduled to return home today, will sign off at this time.   .       Follow Up Recommendations Home health PT;Supervision for mobility/OOB    Equipment Recommendations  None recommended by PT(All needs being met )    Recommendations for Other Services       Precautions / Restrictions Precautions Precautions: Fall Restrictions Weight Bearing Restrictions: No      Mobility  Bed Mobility Overal bed mobility: Needs Assistance Bed Mobility: Supine to Sit     Supine to sit: Max assist     General bed mobility comments: max A supine to sit, support trunk and assist BLE over EOB. pt able to particpate   Transfers Overall transfer level: Needs assistance Equipment used: Rolling walker (2 wheeled) Transfers: Sit to/from Stand Sit to Stand: Max assist          General transfer comment: max A to assist powering up from elevated surface. once standing, min A for stabilization inside RW.   Ambulation/Gait             General Gait Details: defered2/2 to fatigue  Stairs            Wheelchair Mobility    Modified Rankin (Stroke Patients Only)       Balance Overall balance assessment: Needs assistance Sitting-balance support: Feet supported;No upper extremity supported Sitting balance-Leahy Scale: Fair     Standing balance support: Bilateral upper extremity supported;During functional activity Standing balance-Leahy Scale: Poor Standing balance comment: Reliant on external support for standing balance                             Pertinent Vitals/Pain Pain Assessment: Faces Faces Pain Scale: Hurts little more Pain Location: left lower abdomen/groin area Pain Descriptors / Indicators: Aching Pain Intervention(s): Limited activity within patient's tolerance;Monitored during session;Premedicated before session;Repositioned    Home Living Family/patient expects to be discharged to:: Private residence Living Arrangements: Alone Available Help at Discharge: Family;Available PRN/intermittently Type of Home: House Home Access: Ramped entrance     Home Layout: One level Home Equipment: Walker - 2 wheels;Shower seat;Wheelchair - manual;Cane - single point;Walker - 4 wheels      Prior Function Level of Independence: Independent with assistive device(s)         Comments: househoald distances using RW     Hand Dominance        Extremity/Trunk Assessment  Upper Extremity Assessment Upper Extremity Assessment: Generalized weakness    Lower Extremity Assessment Lower Extremity Assessment: Generalized weakness    Cervical / Trunk Assessment Cervical / Trunk Assessment: Normal  Communication   Communication: No difficulties  Cognition Arousal/Alertness: Awake/alert Behavior During Therapy:  WFL for tasks assessed/performed Overall Cognitive Status: Within Functional Limits for tasks assessed                                        General Comments      Exercises General Exercises - Lower Extremity Ankle Circles/Pumps: 10 reps Long Arc Quad: 10 reps Straight Leg Raises: 10 reps   Assessment/Plan    PT Assessment All further PT needs can be met in the next venue of care  PT Problem List Decreased strength;Decreased activity tolerance;Decreased balance;Decreased mobility       PT Treatment Interventions      PT Goals (Current goals can be found in the Care Plan section)  Acute Rehab PT Goals Patient Stated Goal: return home with family to assist PT Goal Formulation: With patient/family Time For Goal Achievement: 09/17/17 Potential to Achieve Goals: Good    Frequency     Barriers to discharge        Co-evaluation               AM-PAC PT "6 Clicks" Daily Activity  Outcome Measure Difficulty turning over in bed (including adjusting bedclothes, sheets and blankets)?: Unable Difficulty moving from lying on back to sitting on the side of the bed? : Unable Difficulty sitting down on and standing up from a chair with arms (e.g., wheelchair, bedside commode, etc,.)?: Unable Help needed moving to and from a bed to chair (including a wheelchair)?: A Lot Help needed walking in hospital room?: A Lot Help needed climbing 3-5 steps with a railing? : Total 6 Click Score: 8    End of Session Equipment Utilized During Treatment: Gait belt Activity Tolerance: Patient limited by fatigue Patient left: in bed;with call bell/phone within reach;with nursing/sitter in room;with family/visitor present Nurse Communication: Mobility status PT Visit Diagnosis: Pain;Unsteadiness on feet (R26.81);Other abnormalities of gait and mobility (R26.89);Muscle weakness (generalized) (M62.81);Difficulty in walking, not elsewhere classified (R26.2)    Time:  1400-1420 PT Time Calculation (min) (ACUTE ONLY): 20 min   Charges:   PT Evaluation $PT Eval Low Complexity: 1 Low PT Treatments $Therapeutic Activity: 8-22 mins   PT G Codes:        Reinaldo Berber, PT, DPT Acute Rehab Services Pager: [phone]    Reinaldo Berber 09/10/2017, 2:45 PM

## 2017-09-10 NOTE — Discharge Summary (Signed)
Physician Discharge Summary  Melanie Porter NAT:[id number] DOB: [birth year] DOA: 09/01/2017  PCP: Sharilyn Sites, MD  Admit date: 09/01/2017 Discharge date: 09/10/2017  Admitted From: home Disposition:  Home with hospice  Recommendations for Outpatient Follow-up:  1. Follow up with hospice services   Home Health: hospice Equipment/Devices: hospital bed  Discharge Condition: guarded CODE STATUS: DNR Diet recommendation: as tolerated   HPI: Per Dr. Dyann Kief, Melanie Porter is a 77 y.o. female with past medical history significant for Melanie Porter cirrhosis, history of depression, gastroesophageal reflux disease/peptic ulcer disease, hypercholesterolemia and hypothyroidism; who presented to the emergency department secondary to increased fatigue, shortness of breath and anasarca.  Looking into her records patient was admitted at the end of April secondary to anasarca at that time she was discharged with a weight of 210 and adjusted dosages of diuretics.  She has on April 1 2.8 L removed with paracentesis and then in April 25 she has 2.5 L removed with paracentesis as well.  Patient symptoms have been present for the last 2 weeks and is steadily worsening to the point that she was referred by her primary care doctor to the emergency department for further evaluation and treatment. Patient expressed having some nausea recently and that she is eating, reports decreased intake also intermittent episode of hematochezia. She denies any abdominal pain, chest pain, shortness of breath, hematuria, dysuria, headaches and focal neurologic/motor deficit.  Hospital Course: GI bleeding / hematochezia - patient was admitted to the hospital with hematochezia. GI was consulted and followed patient while hospitalized. She underwent a colonoscopy on 6/5 unfortunately complicated by colon perforation requiring exploratory laparotomy and partial colectomy on 6/5 by Dr. Arnoldo Morale. Course was also complicated by CVC placement in  the right common carotid artery, for which patient was transferred to [facility] for vascular surgery evaluation. She underwent CVC removal on 6/6 by Dr. Bridgett Larsson.  Liver cirrhosis -patient with end stage liver disease, c/b GI bleeding, anasarca, recurrent ascites. Given advanced disease palliative care was consulted and followed patient. After discussions with family care was transitioned towards comfort and patient will be discharged home with hospice services follow up. She had an abdominal drain placed post op which seems to be draining ascitic fluid. Per surgery, will leave drain in for comfort purposes as patient expressed desire to avoid further paracentesis.  Hypotension - due to liver disease, on Midodrine Aspiration pneumonia - completed treatment while hospitalized.  Anasarca / ascites - continue drain and diuretics for comfort Thrombocytopenia - due to liver disease Hypothyroidism -continue synthroid Depression -continue home medications  Discharge Diagnoses:  Principal Problem:   Anasarca Active Problems:   Hyperlipidemia   Thrombocytopenia (HCC)   PUD (peptic ulcer disease)   Liver cirrhosis secondary to NASH (HCC)   Hypothyroidism   Cirrhosis of liver with ascites ([name])   Ascites   Rectal bleeding   S/P partial colectomy   Encounter for central line placement   Palliative care by specialist   Goals of care, counseling/discussion   DNR (do not resuscitate) discussion   Hypovolemic shock ([place])   Malnutrition of moderate degree     Discharge Instructions   Allergies as of 09/10/2017   No Known Allergies     Medication List    STOP taking these medications   aspirin EC 81 MG tablet   simvastatin 40 MG tablet Commonly known as:  ZOCOR   Vitamin D3 5000 units Caps     TAKE these medications   FLUoxetine 20 MG  capsule Commonly known as:  PROZAC Take 20 mg by mouth daily.   furosemide 80 MG tablet Commonly known as:  LASIX Take 1 tablet (80 mg  total) by mouth 2 (two) times daily.   KLOR-CON M20 20 MEQ tablet Generic drug:  potassium chloride SA Take 20 mEq by mouth daily.   levothyroxine 100 MCG tablet Commonly known as:  SYNTHROID, LEVOTHROID Take 100 mcg by mouth daily.   lidocaine-hydrocortisone 3-0.5 % Crea Commonly known as:  ANAMANTEL HC Apply to the external hemorrhoid area 4 times daily as needed   loratadine-pseudoephedrine 10-240 MG 24 hr tablet Commonly known as:  CLARITIN-D 24-hour Take 1 tablet by mouth daily as needed for allergies.   LORazepam 2 MG/ML concentrated solution Commonly known as:  LORAZEPAM INTENSOL Take 0.3 mLs (0.6 mg total) by mouth every 4 (four) hours as needed for anxiety.   magnesium oxide 400 (241.3 Mg) MG tablet Commonly known as:  MAG-OX Take 1 tablet (400 mg total) by mouth daily.   midodrine 10 MG tablet Commonly known as:  PROAMATINE Take 1 tablet (10 mg total) by mouth 3 (three) times daily with meals.   morphine 20 MG/5ML solution Take 0.6 mLs (2.4 mg total) by mouth every 4 (four) hours as needed for pain.   ondansetron 4 MG disintegrating tablet Commonly known as:  ZOFRAN-ODT Take 1 tablet (4 mg total) by mouth every 6 (six) hours as needed for nausea.   pantoprazole 40 MG tablet Commonly known as:  PROTONIX Take 1 tablet (40 mg total) by mouth daily.   spironolactone 50 MG tablet Commonly known as:  ALDACTONE Take 1 tablet (50 mg total) by mouth 2 (two) times daily.   SYSTANE BALANCE 0.6 % Soln Generic drug:  Propylene Glycol Apply 1 drop to eye daily as needed. Dry Eyes        Consultations:  Palliative  GI  General surgery   PCCM  Procedures/Studies:  Ex lap with colectomy  Colonoscopy  Right neck exploration with repair of common carotid artery  Dg Chest 2 View  Result Date: 09/01/2017 CLINICAL DATA:  Coughing and shortness of breath over the last 2 weeks. EXAM: CHEST - 2 VIEW COMPARISON:  07/23/2017.  06/29/2017. FINDINGS: Poor  inspiration or low lung volumes. Heart size is normal. Chronic aortic atherosclerosis. Bronchial thickening pattern consistent with bronchitis, but no consolidation or lobar collapse. No effusions. Chronic degenerative changes affect the spine. Old thoracolumbar compression fractures. IMPRESSION: Possible bronchitis. No consolidation or collapse. Poor inspiration versus low lung volumes. Electronically Signed   By: Nelson Chimes M.D.   On: 09/01/2017 12:27   Ct Chest W Contrast  Result Date: 09/03/2017 CLINICAL DATA:  Central line placement. EXAM: CT CHEST WITH CONTRAST TECHNIQUE: Multidetector CT imaging of the chest was performed during intravenous contrast administration. CONTRAST:  75 mL ISOVUE-300 IOPAMIDOL (ISOVUE-300) INJECTION 61% COMPARISON:  Chest radiograph 09/03/2017 FINDINGS: Cardiovascular: Right-sided vascular catheter traverses the right internal jugular vein and enters the right common carotid artery, with tip in the proximal aortic arch. There is atherosclerotic calcification in the aorta. Mild cardiomegaly. There is soft tissue edema in the right neck, extending into the upper right chest wall and axilla. Mediastinum/Nodes: No mediastinal, hilar or axillary lymphadenopathy. The visualized thyroid and thoracic esophageal course are unremarkable. Lungs/Pleura: Lungs are clear. No pleural effusion or pneumothorax. Upper Abdomen: There is a large amount of free air in the anterior abdomen. The liver is diffusely nodular. Small volume ascites. Musculoskeletal: No chest wall abnormality. No  acute or significant osseous findings. IMPRESSION: 1. Right-sided vascular access catheter is intra arterial with tip in the aortic arch. The catheter traverses the right internal jugular vein and enters the right common carotid artery before continuing inferiorly to the aortic arch. There is surrounding soft tissue edema that extends into the right upper chest wall and axilla. 2. Large volume pneumoperitoneum,  consistent with recent perforation and subsequent abdominal surgery. 3.  Aortic Atherosclerosis (ICD10-I70.0). Critical Value/emergent results were called by telephone at the time of interpretation on 09/03/2017 at 10:35 pm to Dr. Aviva Signs , who verbally acknowledged these results. Electronically Signed   By: Ulyses Jarred M.D.   On: 09/03/2017 22:35   US Paracentesis  Result Date: 09/01/2017 INDICATION: Recurrent ascites, cirrhosis due to NASH EXAM: ULTRASOUND GUIDED THERAPEUTIC PARACENTESIS MEDICATIONS: None. COMPLICATIONS: None immediate. PROCEDURE: Procedure, benefits, and risks of procedure were discussed with patient. Written informed consent for procedure was obtained. Time out protocol followed. Adequate collection of ascites localized by ultrasound in RIGHT lower quadrant. Skin prepped and draped in usual sterile fashion. Skin and soft tissues anesthetized with 10 mL of 1% lidocaine. 5 [place] Yueh catheter placed into peritoneal cavity. 5 L of clear yellow ascitic fluid aspirated by vacuum bottle suction. Procedure tolerated well by patient without immediate complication. FINDINGS: As above IMPRESSION: Successful ultrasound-guided paracentesis yielding 5 liters of peritoneal fluid. Electronically Signed   By: Lavonia Dana M.D.   On: 09/01/2017 15:58   Dg Chest Port 1 View  Result Date: 09/03/2017 CLINICAL DATA:  Central line placement. EXAM: PORTABLE CHEST 1 VIEW COMPARISON:  Radiograph of September 01, 2017. FINDINGS: The heart size and mediastinal contours are within normal limits. Both lungs are clear. Hypoinflation of the lungs is noted. No pneumothorax or pleural effusion is noted. Interval placement of right-sided neck catheter. The tip is positioned more medially than expected and is concerning for possible arterial puncture. The visualized skeletal structures are unremarkable. IMPRESSION: Hypoinflation of the lungs. Interval placement of right-sided neck catheter. The distal tip is significantly  more medial than would be expected for superior vena cava and internal jugular venous access. The possibility of inadvertent arterial catheterization with tip in aorta cannot be excluded. Clinical correlation is recommended as well as further evaluation with CT scan. These results will be called to the ordering clinician or representative by the Radiologist Assistant, and communication documented in the PACS or zVision Dashboard. Electronically Signed   By: Marijo Conception, M.D.   On: 09/03/2017 18:03     Subjective: -feeling weak  Discharge Exam: Vitals:   09/09/17 2123 09/10/17 0504  BP: 92/70 (!) 107/54  Pulse: 93 87  Resp: 18 17  Temp: 97.7 F (36.5 C) (!) 97.3 F (36.3 C)  SpO2: 99% 99%    General: Pt is alert, awake, not in acute distress Cardiovascular: RRR, S1/S2 +, no rubs, no gallops Respiratory: CTA bilaterally, no wheezing, no rhonchi  The results of significant diagnostics from this hospitalization (including imaging, microbiology, ancillary and laboratory) are listed below for reference.     Microbiology: Recent Results (from the past 240 hour(s))  MRSA PCR Screening     Status: None   Collection Time: 09/03/17  5:18 PM  Result Value Ref Range Status   MRSA by PCR NEGATIVE NEGATIVE Final    Comment:        The GeneXpert MRSA Assay (FDA approved for NASAL specimens only), is one component of a comprehensive MRSA colonization surveillance program. It is not intended  to diagnose MRSA infection nor to guide or monitor treatment for MRSA infections. Performed at [facility], [address], [place], [place] [address]   Surgical pcr screen     Status: None   Collection Time: 09/04/17 11:29 AM  Result Value Ref Range Status   MRSA, PCR NEGATIVE NEGATIVE Final   Staphylococcus aureus NEGATIVE NEGATIVE Final    Comment: (NOTE) The Xpert SA Assay (FDA approved for NASAL specimens in patients 22 years of age and older), is one component of a  comprehensive surveillance program. It is not intended to diagnose infection nor to guide or monitor treatment. Performed at [facility], [place] [address], [place], [place] [address]      Labs: BNP (last 3 results) Recent Labs    06/09/17 1538 06/29/17 2254  BNP 38.0 74.0   Basic Metabolic Panel: Recent Labs  Lab 09/04/17 0433 09/05/17 0357 09/06/17 0352 09/07/17 0416 09/08/17 0414 09/09/17 0413  NA 135 135 136 139 138 138  K 3.8 4.0 3.7 4.0 4.1 4.3  CL 103 105 108 109 110 111  CO2 25 24 25 25 22  21*  GLUCOSE 160* 134* 129* 110* 115* 100*  BUN 21* 18 17 14 13 19   CREATININE 1.28* 1.29* 1.02* 0.82 0.91 1.04*  CALCIUM 8.4* 8.3* 8.5* 9.0 9.3 9.0  MG 1.3*  --  1.9 1.9  --   --   PHOS 3.3  --   --  2.0*  --   --    Liver Function Tests: Recent Labs  Lab 09/03/17 2219 09/04/17 0433 09/05/17 0357 09/06/17 0352  AST 37 33 23 18  ALT 23 21 17  13*  ALKPHOS 84 81 63 55  BILITOT 1.7* 1.6* 1.7* 2.3*  PROT 5.3* 5.1* 4.9* 5.1*  ALBUMIN 2.7* 2.7* 2.7* 3.1*   No results for input(s): LIPASE, AMYLASE in the last 168 hours. No results for input(s): AMMONIA in the last 168 hours. CBC: Recent Labs  Lab 09/05/17 0357 09/05/17 1120 09/06/17 0352 09/07/17 0416 09/08/17 0414 09/09/17 0413  WBC 8.4  --  6.8 4.8 6.3 9.0  NEUTROABS  --   --  5.0 3.2 4.7 6.5  HGB 7.0* 8.8* 8.7* 8.0* 8.7* 8.8*  HCT 20.8* 25.9* 25.9* 23.6* 25.5* 25.3*  MCV 103.5*  --  102.8* 101.7* 102.0* 99.2  PLT 75*  --  64* 57* 66* 68*   Cardiac Enzymes: No results for input(s): CKTOTAL, CKMB, CKMBINDEX, TROPONINI in the last 168 hours. BNP: Invalid input(s): POCBNP CBG: No results for input(s): GLUCAP in the last 168 hours. D-Dimer No results for input(s): DDIMER in the last 72 hours. Hgb A1c No results for input(s): HGBA1C in the last 72 hours. Lipid Profile No results for input(s): CHOL, HDL, LDLCALC, TRIG, CHOLHDL, LDLDIRECT in the last 72 hours. Thyroid function studies No results for  input(s): TSH, T4TOTAL, T3FREE, THYROIDAB in the last 72 hours.  Invalid input(s): FREET3 Anemia work up No results for input(s): VITAMINB12, FOLATE, FERRITIN, TIBC, IRON, RETICCTPCT in the last 72 hours. Urinalysis    Component Value Date/Time   COLORURINE YELLOW 07/23/2017 [place] 07/23/2017 1509   LABSPEC 1.016 07/23/2017 1509   PHURINE 6.0 07/23/2017 [place] 07/23/2017 1509   HGBUR NEGATIVE 07/23/2017 [place] 07/23/2017 [place] 07/23/2017 1509   PROTEINUR NEGATIVE 07/23/2017 1509   UROBILINOGEN 0.2 07/27/2014 1220   NITRITE NEGATIVE 07/23/2017 [place] 07/23/2017 1509  Sepsis Labs Invalid input(s): PROCALCITONIN,  WBC,  LACTICIDVEN   Time coordinating discharge: 40 minutes  SIGNED:  Marzetta Board, MD  [facility] 09/10/2017, 1:20 PM Pager [phone]  If 7PM-7AM, please contact night-coverage [email or web address] Password [email or web address]

## 2017-09-10 NOTE — Progress Notes (Addendum)
[facility]/Trauma Progress Note  6 Days Post-Op   Assessment/Plan Principal Problem: Anasarca Active Problems: Hyperlipidemia Thrombocytopenia (HCC) PUD (peptic ulcer disease) Liver cirrhosis secondary to NASH (HCC) Hypothyroidism Cirrhosis of liver with ascites (HCC) Ascites Rectal bleeding S/P partial colectomy Encounter for central line placement Palliative care by specialist Goals of care, counseling/discussion DNR (do not resuscitate) discussion Hypovolemic shock ([place])  S/P exploratory laparotomy, partial colectomy, drain placement, Dr. Arnoldo Morale, 06/05 - pulm toilet with IS - needs tomobilize. - + flatus increase diet to fulls -continuesurgical drain, output down, high output likely 2/2cirrhosis. Fluid is serosanguinous. - surgical drain output 1250cc in 24 hours down from 2750 the day prior  XVQ:MGQQP, breeze YPP:JKDT ID: Invanz 06/06 Foley:purewick Follow up: Dr. Arnoldo Morale  Plan:  Patient wishes for hospice care at home. Will discuss with M.D. If we can leave surgical drain as a comfort measure.     LOS: 9 days    Subjective: CC: S/P partial colectomy  No complaints of abdominal pain, nausea vomiting. Patient endorses flatus. Patient states she is not drinking much although did try to drink the breeze yesterday. Melanie Gowda, NP at bedside discussing EOL goals with patient, son and family friend. Discussed options with surgical drain. Discuss that if the drain is left in and her output continues to be high that she could become dehydrated very quickly at home and this will shorten her time here. Patient would prefer to leave drain in to prevent the need for return to hospital for paracentesis. Patient and family expressed understanding of the outcome if drain is left in place.  Objective: Vital signs in last 24 hours: Temp:  [97.3 F (36.3 C)-98.1 F (36.7 C)] 97.3 F (36.3 C) (06/12 0504) Pulse Rate:   [87-100] 87 (06/12 0504) Resp:  [17-21] 17 (06/12 0504) BP: (92-109)/(39-70) 107/54 (06/12 0504) SpO2:  [98 %-100 %] 99 % (06/12 0504) Last BM Date: 09/03/17  Intake/Output from previous day: 06/11 0701 - 06/12 0700 In: 925 [P.O.:700; I.V.:225] Out: 1450 [Urine:200; Drains:1250] Intake/Output this shift: No intake/output data recorded.  PE: Gen: Alert, NAD, pleasant, cooperative Pulm:Rate andeffort normal Abd: Soft,not distended, midline incision with staples and is without signs of infection.drain with serosanguinous drainage,very mild TTP on left side, no guarding, no signs of peritonitis Skin: warm and dry  Anti-infectives: Anti-infectives (From admission, onward)   Start     Dose/Rate Route Frequency Ordered Stop   09/06/17 1400  ertapenem (INVANZ) 1 g in sodium chloride 0.9 % 100 mL IVPB  Status:  Discontinued     1 g 200 mL/hr over 30 Minutes Intravenous Every 24 hours 09/06/17 0344 09/09/17 1153   09/04/17 1200  ertapenem (INVANZ) 1 g in sodium chloride 0.9 % 50 mL IVPB  Status:  Discontinued     1 g 100 mL/hr over 30 Minutes Intravenous Every 24 hours 09/03/17 1720 09/06/17 0344   09/03/17 2200  piperacillin-tazobactam (ZOSYN) IVPB 3.375 g  Status:  Discontinued     3.375 g 12.5 mL/hr over 240 Minutes Intravenous Every 8 hours 09/03/17 1801 09/03/17 1815   09/03/17 1245  ertapenem (INVANZ) 1 g in sodium chloride 0.9 % 100 mL IVPB     1 g 200 mL/hr over 30 Minutes Intravenous  Once 09/03/17 1239 09/03/17 1310      Lab Results:  Recent Labs    09/08/17 0414 09/09/17 0413  WBC 6.3 9.0  HGB 8.7* 8.8*  HCT 25.5* 25.3*  PLT 66* 68*   BMET Recent Labs  09/08/17 0414 09/09/17 0413  NA 138 138  K 4.1 4.3  CL 110 111  CO2 22 21*  GLUCOSE 115* 100*  BUN 13 19  CREATININE 0.91 1.04*  CALCIUM 9.3 9.0   PT/INR No results for input(s): LABPROT, INR in the last 72 hours. CMP     Component Value Date/Time   NA 138 09/09/2017 0413   K 4.3 09/09/2017  0413   CL 111 09/09/2017 0413   CO2 21 (L) 09/09/2017 0413   GLUCOSE 100 (H) 09/09/2017 0413   BUN 19 09/09/2017 0413   CREATININE 1.04 (H) 09/09/2017 0413   CREATININE 0.79 02/26/2017 1407   CALCIUM 9.0 09/09/2017 0413   PROT 5.1 (L) 09/06/2017 0352   PROT 7.2 09/16/2013   ALBUMIN 3.1 (L) 09/06/2017 0352   ALBUMIN 3.6 09/16/2013   AST 18 09/06/2017 0352   AST 44 09/16/2013   ALT 13 (L) 09/06/2017 0352   ALT 39 09/16/2013   ALKPHOS 55 09/06/2017 0352   ALKPHOS 146 09/16/2013   BILITOT 2.3 (H) 09/06/2017 0352   BILITOT 0.6 09/16/2013   GFRNONAA 51 (L) 09/09/2017 0413   GFRNONAA 83 05/12/2015 0905   GFRAA 59 (L) 09/09/2017 0413   GFRAA >89 05/12/2015 0905   Lipase     Component Value Date/Time   LIPASE 25 07/23/2017 1515    Studies/Results: No results found.    Kalman Drape , [facility] 09/10/2017, 11:03 AM  Pager: [phone] Mon-Wed, Friday 7:00am-4:30pm Thurs 7am-11:30am  Consults: [phone]

## 2017-09-10 NOTE — Care Management Important Message (Signed)
Important Message  Patient Details  Name: ADIBA FARGNOLI MRN: [id number] Date of Birth: 10/04/1940   Medicare Important Message Given:  No  Patient is at the end of life and out of respect no IM given.  Caleah Tortorelli 09/10/2017, 1:17 PM

## 2017-09-10 NOTE — Progress Notes (Addendum)
Daily Progress Note   Patient Name: Melanie Porter       Date: 09/10/2017 DOB: 1941-03-28  Age: 77 y.o. MRN#: [id number] Attending Physician: Caren Griffins, MD Primary Care Physician: Sharilyn Sites, MD Admit Date: 09/01/2017  Reason for Consultation/Follow-up: Establishing goals of care and Psychosocial/spiritual support  Subjective: Patient resting in bed. Son, daughter, and friend at bedside.   We discussed diagnoses, prognosis, GOC, EOL wishes disposition and options.  A detailed discussion was had today regarding advanced directives.  Concepts specific to code status, artifical feeding and hydration, continued IV antibiotics and rehospitalization was discussed.  The difference between an aggressive medical intervention path and a hospice comfort care path was discussed.  Values and goals of care important to patient and family were attempted to be elicited. Natural trajectory and expectations at EOL were discussed.     Melanie Porter states prior to this hospitalization, she had a poor quality of life. She slept most of the time,and she felt badly.  She states she wants to focus on comfort only. She would like to proceed home with hospice. Her family states they will make arrangements to assist her at home. She does not ever want any additional paracentesis procedures. We discussed leaving the drain in place at discharge for comfort due to volume and reaccumulation vs removal and paracentesis.We discussed her prognosis of days to weeks, as leaving the drain could shorten her life. She would like to continue the drain for comfort. Spoke with surgery about this and they are amenable. Spoke with surgery at [facility], note placed by them regarding staple removal directions for hospice team. All  questions answered.   MOST form completed for DNR, comfort care, discuss abx if indicated, no feeding tube or IV fluids.   Son states tomorrow he and his wife are meeting an attorney to bring home a child they have adopted. Discussed this with CM.   Length of Stay: 9  Current Medications: Scheduled Meds:  . Chlorhexidine Gluconate Cloth  6 each Topical Once  . Chlorhexidine Gluconate Cloth  6 each Topical Daily  . feeding supplement  1 Container Oral TID BM  . levothyroxine  100 mcg Oral QAC breakfast  . midodrine  10 mg Oral TID WC  . nystatin cream   Topical BID  . sodium chloride  flush  10-40 mL Intracatheter Q12H  . sodium chloride flush  3 mL Intravenous Q12H    Continuous Infusions: . sodium chloride Stopped (09/04/17 1605)    PRN Meds: sodium chloride, acetaminophen **OR** [DISCONTINUED] acetaminophen, alum & mag hydroxide-simeth, hydrocortisone-pramoxine, HYDROmorphone (DILAUDID) injection, LORazepam, ondansetron **OR** ondansetron (ZOFRAN) IV, polyvinyl alcohol, sodium chloride flush, traMADol  Physical Exam  Constitutional: No distress.  Pulmonary/Chest: Effort normal.  Neurological: She is alert.  Skin: Skin is warm and dry.            Vital Signs: BP (!) 107/54 (BP Location: Right Arm)   Pulse 87   Temp (!) 97.3 F (36.3 C) (Oral)   Resp 17   Ht 5\' 2"  (1.575 m)   Wt 78.3 kg (172 lb 9.9 oz)   SpO2 99%   BMI 31.57 kg/m  SpO2: SpO2: 99 % O2 Device: O2 Device: Room Air O2 Flow Rate: O2 Flow Rate (L/min): 2 L/min  Intake/output summary:   Intake/Output Summary (Last 24 hours) at 09/10/2017 1145 Last data filed at 09/10/2017 7628 Gross per 24 hour  Intake 460 ml  Output 1450 ml  Net -990 ml   LBM: Last BM Date: 09/03/17 Baseline Weight: Weight: 93 kg (205 lb) Most recent weight: Weight: 78.3 kg (172 lb 9.9 oz)       Palliative Assessment/Data: 40%    Flowsheet Rows     Most Recent Value  Intake Tab  Referral Department  Hospitalist  Unit at  Time of Referral  Cardiac/Telemetry Unit  Palliative Care Primary Diagnosis  Other (Comment)  Date Notified  09/03/17  Palliative Care Type  [facility]  Reason for referral  Clarify Goals of Care  Date of Admission  09/01/17  Date first seen by Palliative Care  09/03/17  # of days Palliative referral response time  0 Day(s)  # of days IP prior to Palliative referral  2  Clinical Assessment  Palliative Performance Scale Score  30%  Pain Max last 24 hours  Not able to report  Pain Min Last 24 hours  Not able to report  Dyspnea Max Last 24 Hours  Not able to report  Dyspnea Min Last 24 hours  Not able to report  Psychosocial & Spiritual Assessment  Palliative Care Outcomes  Patient/Family meeting held?  No [no family at bedside, emergency surgery]      Patient Active Problem List   Diagnosis Date Noted  . Malnutrition of moderate degree 09/09/2017  . Encounter for central line placement   . Palliative care by specialist   . Goals of care, counseling/discussion   . DNR (do not resuscitate) discussion   . Hypovolemic shock ([place])   . S/P partial colectomy 09/03/2017  . Rectal bleeding   . Ascites   . Abnormal CT of the abdomen 07/23/2017  . Decreased appetite 07/23/2017  . Nausea without vomiting 07/23/2017  . Dark stools 07/23/2017  . Hypothyroidism 06/30/2017  . Diarrhea 06/30/2017  . Volume overload 06/30/2017  . Cirrhosis of liver with ascites ([place]) 06/30/2017  . Anasarca   . Liver cirrhosis secondary to NASH ([place]) 06/23/2015  . Spinal stenosis of lumbar region 01/25/2014  . PUD (peptic ulcer disease) 11/18/2013  . Undiagnosed cardiac murmurs 11/18/2013  . Osteopenia 07/20/2013  . Vaginal atrophy 07/20/2013  . Platelet disorder(CLUMPING) 12/28/2012  . Dermatitis perineal 12/28/2012  . Splenomegaly 09/23/2012  . 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 Thrombocytopenia  (  [place]) 02/19/2012  . Fibromyalgia 02/04/2012  . Hyperlipidemia 02/04/2012  . Arthritis 02/04/2012  . GERD (gastroesophageal reflux disease) 02/04/2012    Palliative Care Assessment & Plan   Patient Profile:  77 y.o. female  with past medical history of Melanie Porter cirrhosis, anxiety and depression, fibromyalgia, obesity, thrombocytopenia, GERD, hypercholesterolemia and hypothyroidism, colonoscopy 6/5 with bowel perf, in emergency surgery rest of day admitted on 09/01/2017 with anasarca and ascites and GI bleed.    Assessment/ Recommendations/Plan:  Plans for home with hospice. Continue abdominal drain for comfort. Directions left by [facility] surgery team for when and how to remove staples.      Code Status:    Code Status Orders  (From admission, onward)        Start     Ordered   09/03/17 1751  Do not attempt resuscitation (DNR)  Continuous    Question Answer Comment  In the event of cardiac or respiratory ARREST Do not call a "code blue"   In the event of cardiac or respiratory ARREST Do not perform Intubation, CPR, defibrillation or ACLS   In the event of cardiac or respiratory ARREST Use medication by any route, position, wound care, and other measures to relive pain and suffering. May use oxygen, suction and manual treatment of airway obstruction as needed for comfort.      09/03/17 1750    Code Status History    Date Active Date Inactive Code Status Order ID Comments User Context   09/01/2017 1629 09/03/2017 1750 Full Code [id number]  Barton Dubois, MD Inpatient   07/23/2017 2112 07/29/2017 1921 Full Code [id number]  Rodena Goldmann, DO Inpatient   06/30/2017 0054 07/02/2017 1933 Full Code [id number]  Heath Lark D, DO ED       Prognosis:  < 2 weeks. Cirrhosis with multiple previous paracentesis procedures. Very little oral intake. Large amount of weight loss. IV albumin. Surgery drain with output in last 24 hours of 1250 cc's.   Discharge Planning:  To Be Determined  Care plan  was discussed with primary MD, surgery at [facility] and [facility], CM.  Thank you for allowing the Palliative Medicine Team to assist in the care of this patient.   Total Time 10:00 - 12:30  2.5 hours Prolonged Time Billed yes      Greater than 50%  of this time was spent counseling and coordinating care related to the above assessment and plan.  Asencion Gowda, NP  Please contact Palliative Medicine Team phone at [phone] for questions and concerns.

## 2017-09-10 NOTE — Progress Notes (Signed)
[facility]  Discussed with palliative RN.   JP to stay in place to straight drain as it is currently being managed. Will keep her from developing ascites since she does not want any additional paracentesis.    Half of staples out at POD 14 and remaining out at POD 21. Due to risk of leaking from midline.   Updated Dr. Arnoldo Morale.  Curlene Labrum, MD [facility] [address] [place], [place] [address] [phone] (office)

## 2017-09-10 NOTE — Progress Notes (Signed)
PT Cancellation Note  Patient Details Name: Melanie Porter MRN: [id number] DOB: [birth year]   Cancelled Treatment:     Attempted to eval pt at 68, pt currently meeting with other provider, will cont to follow.   Reinaldo Berber, PT, DPT Acute Rehab Services Pager: [phone]     Reinaldo Berber 09/10/2017, 10:44 AM

## 2017-09-10 NOTE — Progress Notes (Signed)
PTAR on floor. Pt discharged to home with hospice care.

## 2017-09-12 DIAGNOSIS — K631 Perforation of intestine (nontraumatic): Secondary | ICD-10-CM

## 2017-09-22 ENCOUNTER — Encounter: Payer: PPO | Admitting: Gynecology

## 2017-09-22 DIAGNOSIS — Z0289 Encounter for other administrative examinations: Secondary | ICD-10-CM

## 2017-09-24 ENCOUNTER — Ambulatory Visit: Payer: PPO | Admitting: Gastroenterology

## 2017-09-29 DEATH — deceased

## 2017-12-01 IMAGING — US US ABDOMEN COMPLETE
1 series · 14 of 25 positions shown · non-contrast
Comparison: Abdominal ultrasound June 10, 2014

CLINICAL DATA: Hepatic cirrhosis, nonalcoholic steatohepatitis,
splenomegaly.

EXAM:
ABDOMEN ULTRASOUND COMPLETE

[Series 1: us abdomen complete · 0.16mm/px · 14 of 131 slices shown]
[im 1/131]
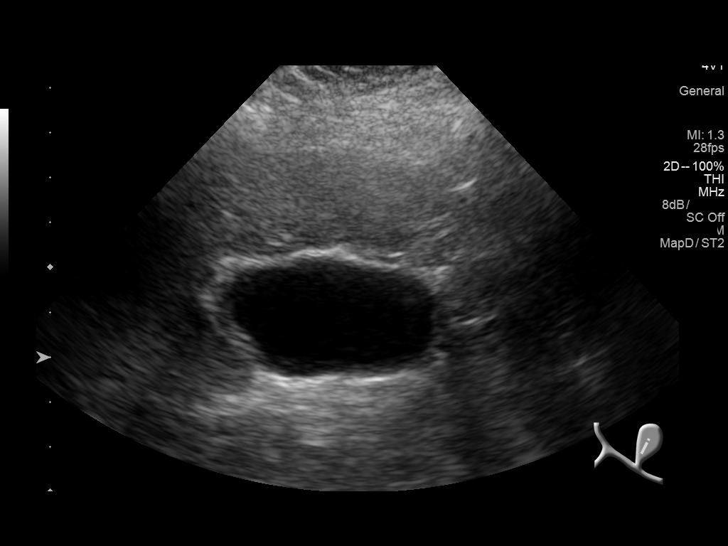
[im 11/131]
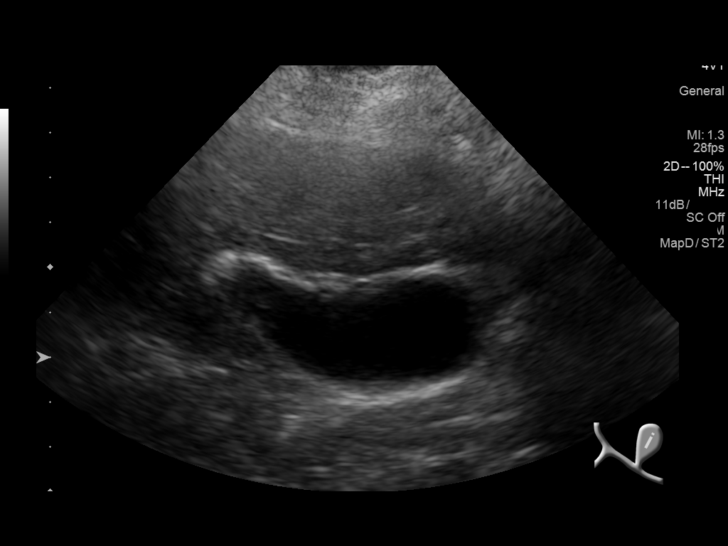
[im 22/131]
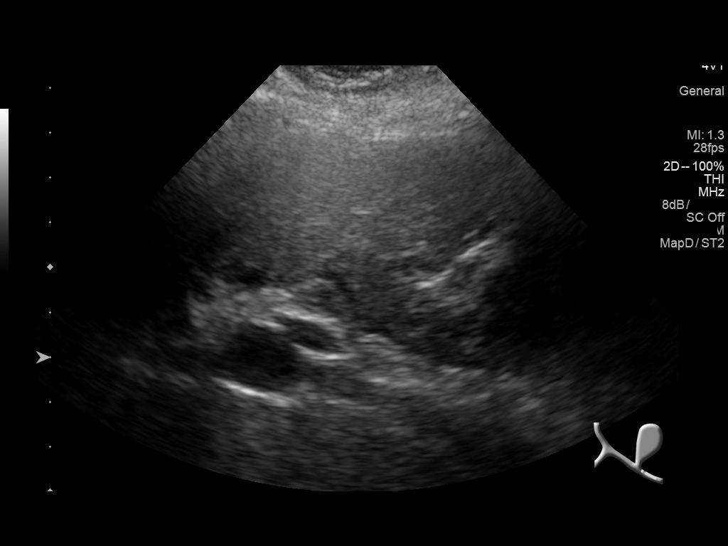
[im 33/131]
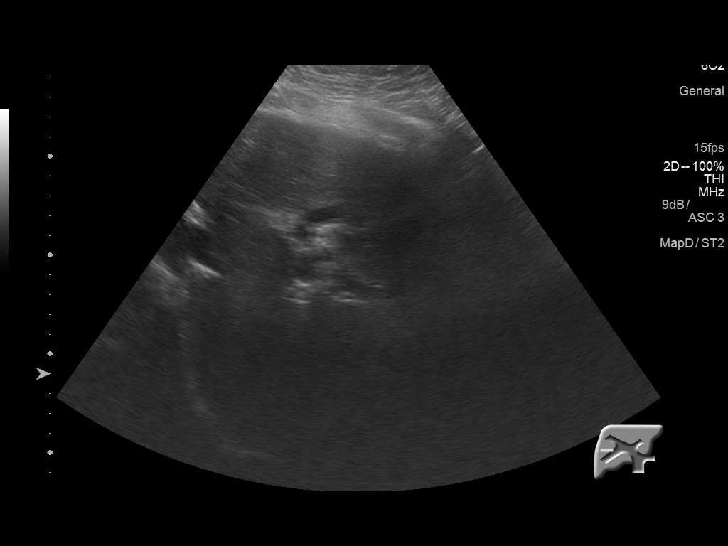
[im 44/131]
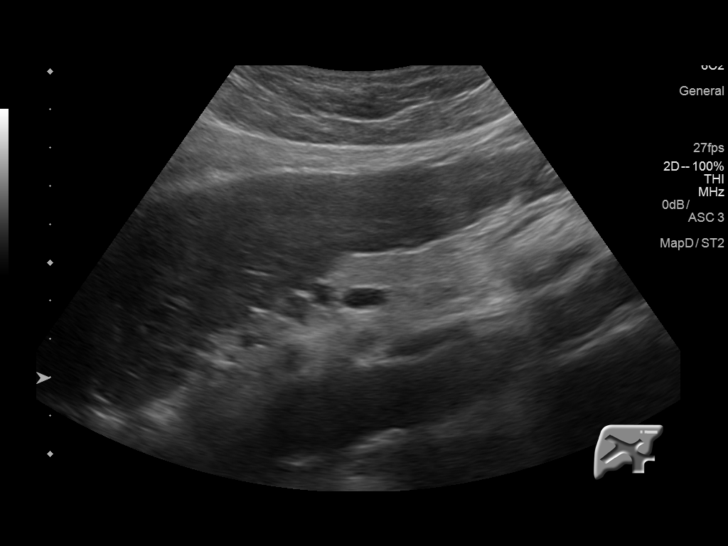
[im 49/131]
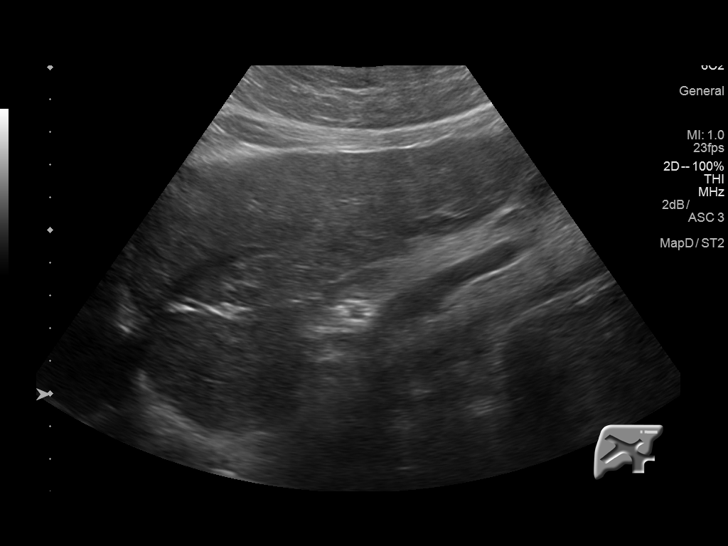
[im 60/131]
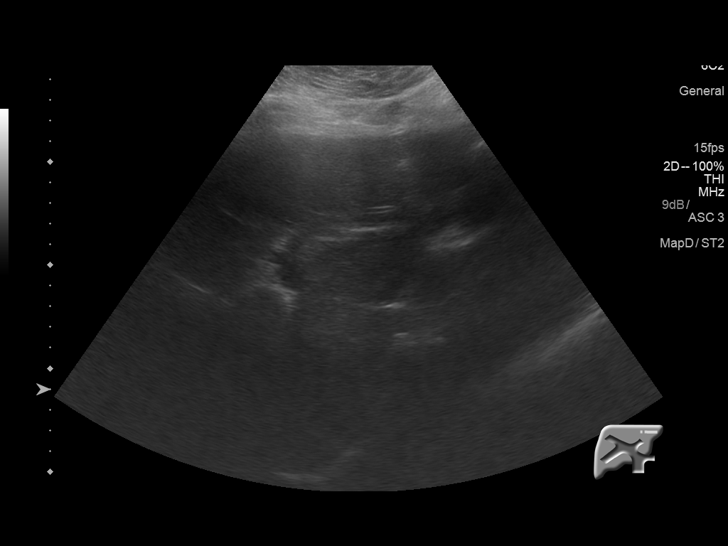
[im 71/131]
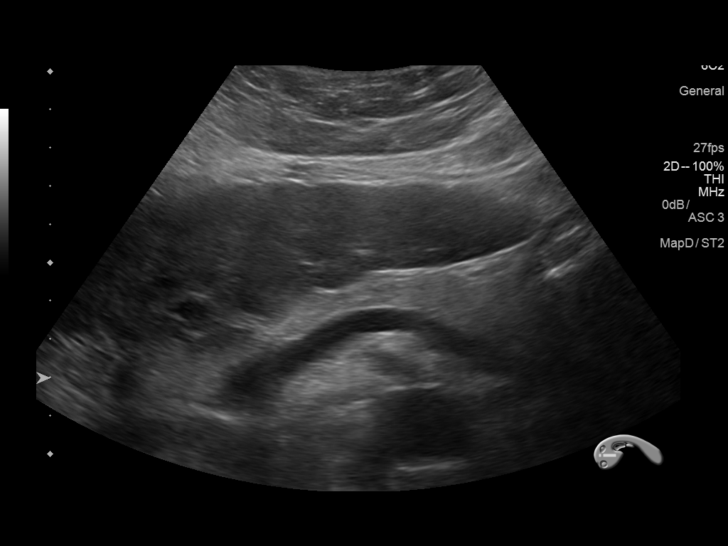
[im 82/131]
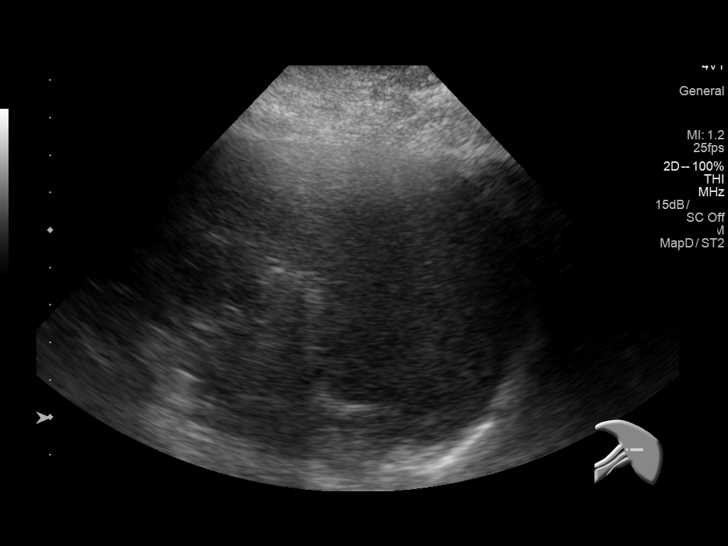
[im 87/131]
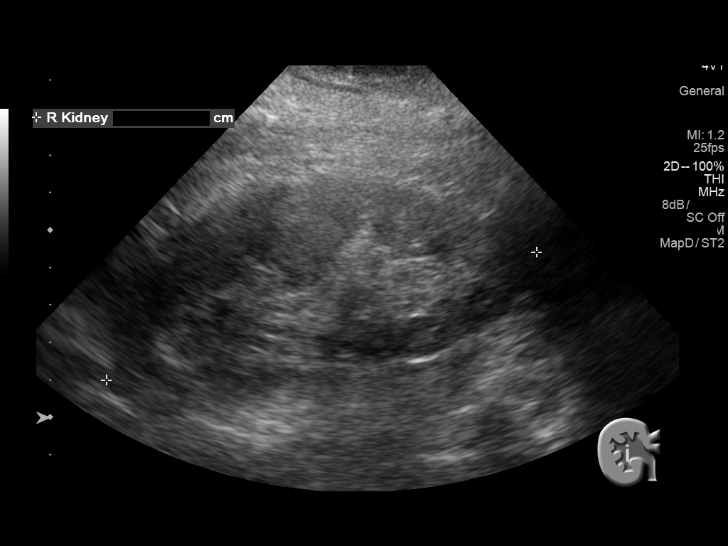
[im 98/131]
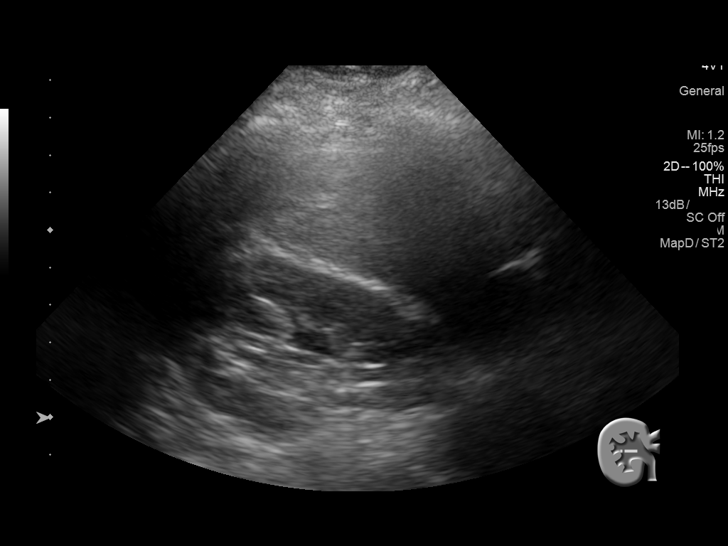
[im 109/131]
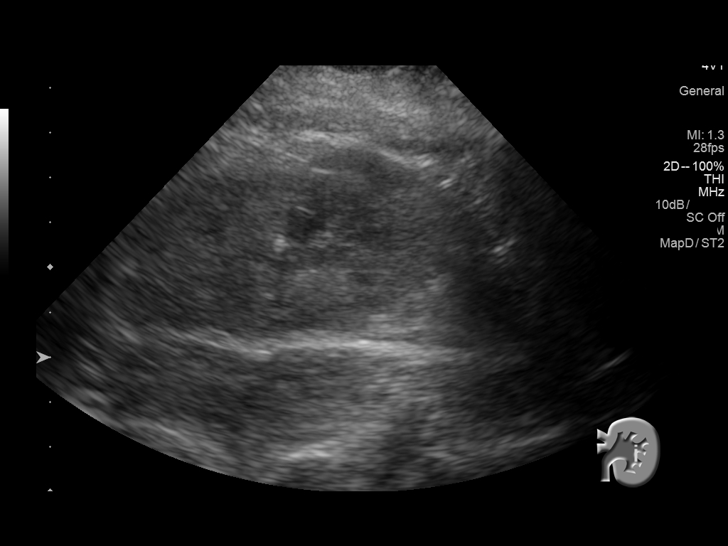
[im 120/131]
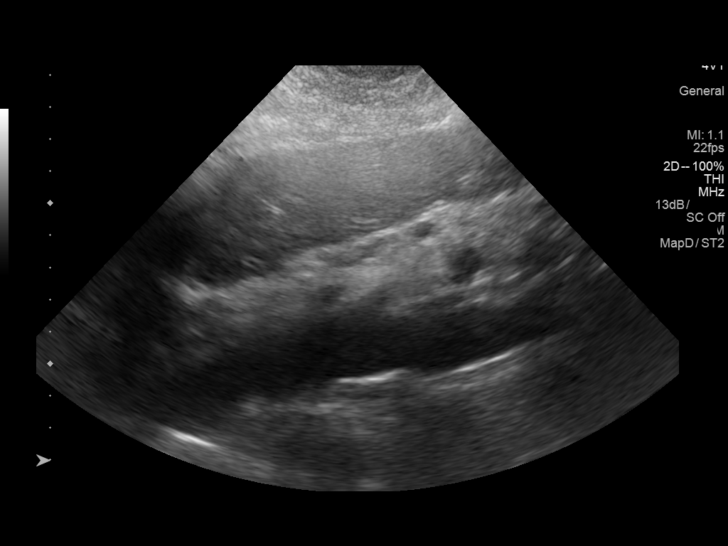
[im 131/131]
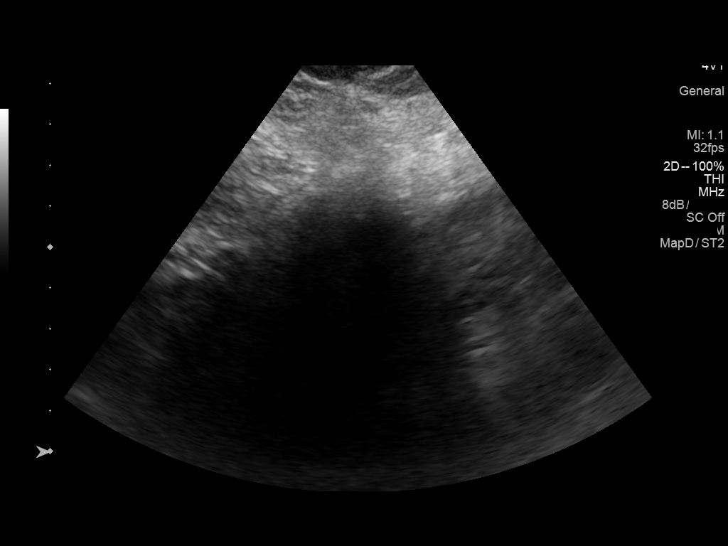

[14 of 25 positions shown; findings below may reference images not displayed]

FINDINGS: Gallbladder: No gallstones or wall thickening visualized. No
sonographic Murphy sign noted by sonographer.

Common bile duct: Diameter: 2.7 mm

Liver: The hepatic echotexture is heterogeneous. There is no
discrete mass. There is no ductal dilation. The surface contour of
the liver is irregular.

IVC: No abnormality visualized.

Pancreas: Visualized portion unremarkable.

Spleen: The spleen is mildly enlarged with calculated volume of 699
cc. It measures 12.2 x 12 x 9.6 cm.

Right Kidney: Length: 12 cm. Echogenicity within normal limits. No
mass or hydronephrosis visualized.

Left Kidney: Length: 12.2 cm. Echogenicity within normal limits. No
mass or hydronephrosis visualized.

Abdominal aorta: Bowel gas limits evaluation of the abdominal aorta.

Other findings: No ascites is observed.
IMPRESSION: 1. Stable cirrhotic changes of the liver. No discrete hepatic mass
is observed.
2. Mild splenomegaly.
3. No acute abnormality is observed elsewhere within the abdomen.

## 2019-11-16 IMAGING — DX DG CHEST 2V
2 series · 2 of 2 positions shown · non-contrast
Comparison: 08/26/2012 chest CT.  06/10/2010 chest x-ray.

CLINICAL DATA: 76-year-old female with shortness breath and fluid
retention. Initial encounter.

EXAM:
CHEST - 2 VIEW

[chest pa]
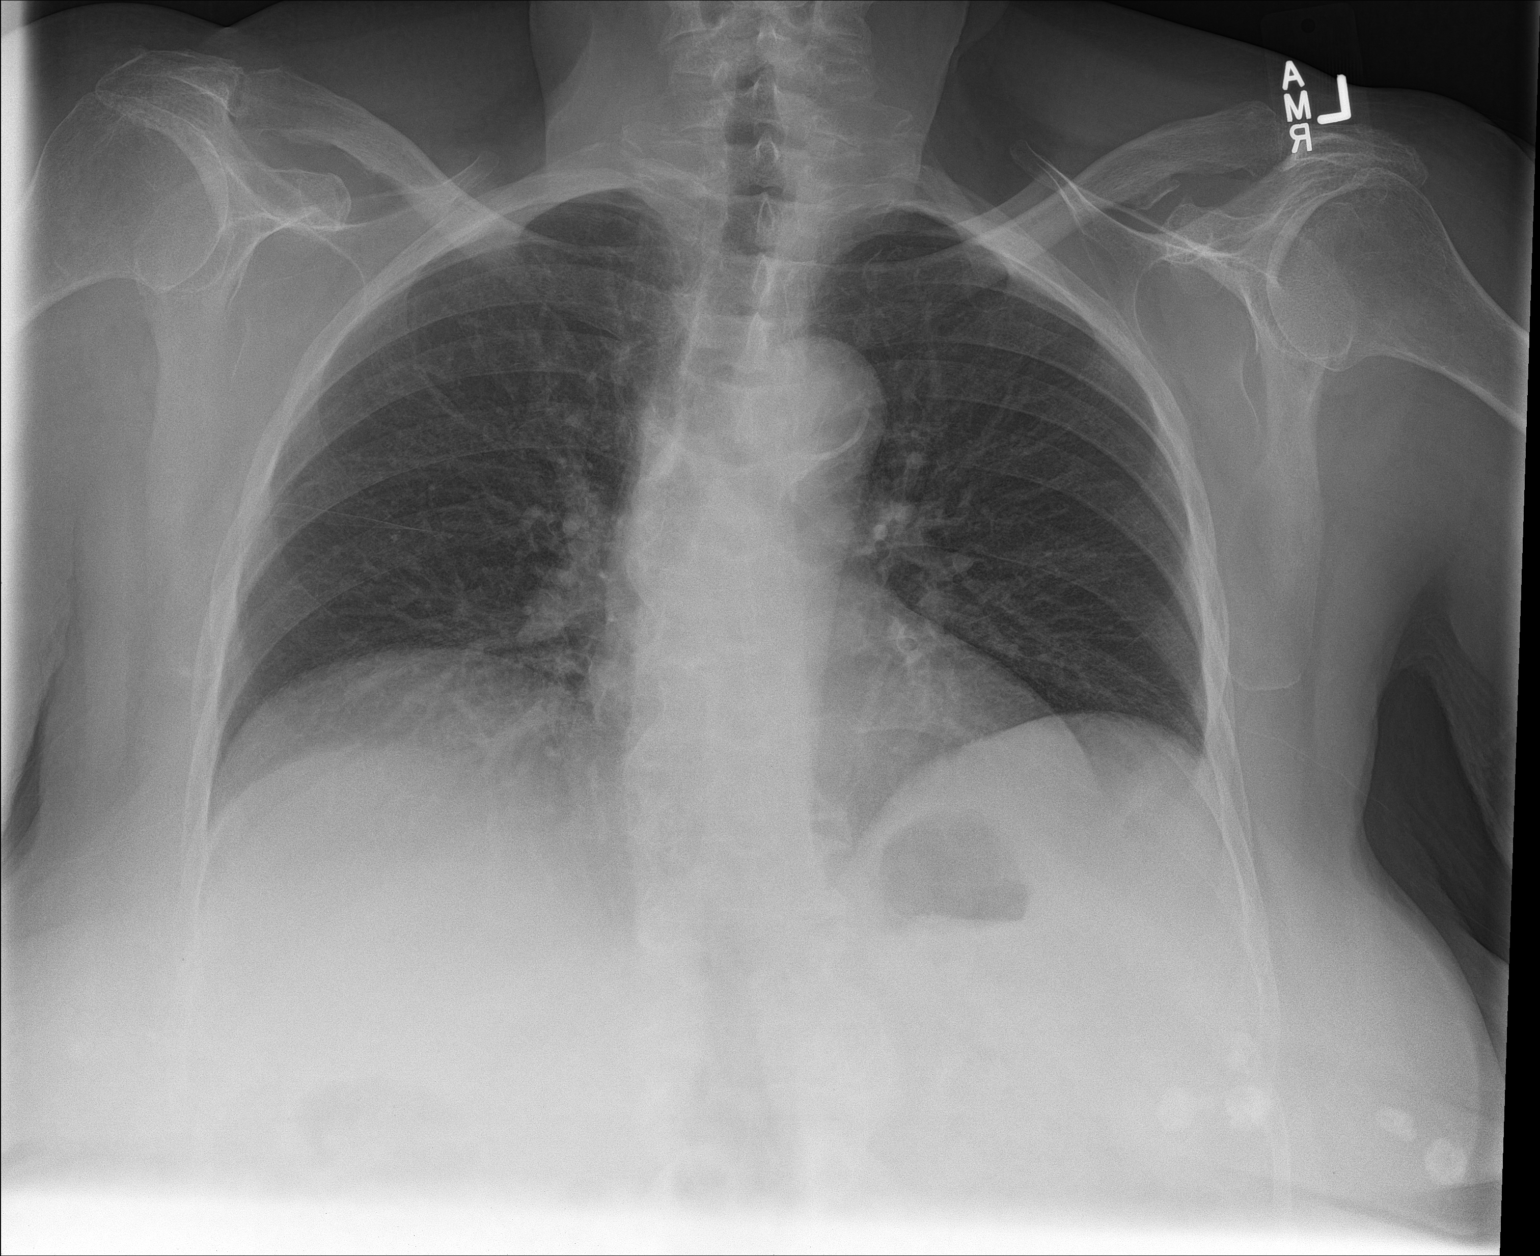

[chest lat]
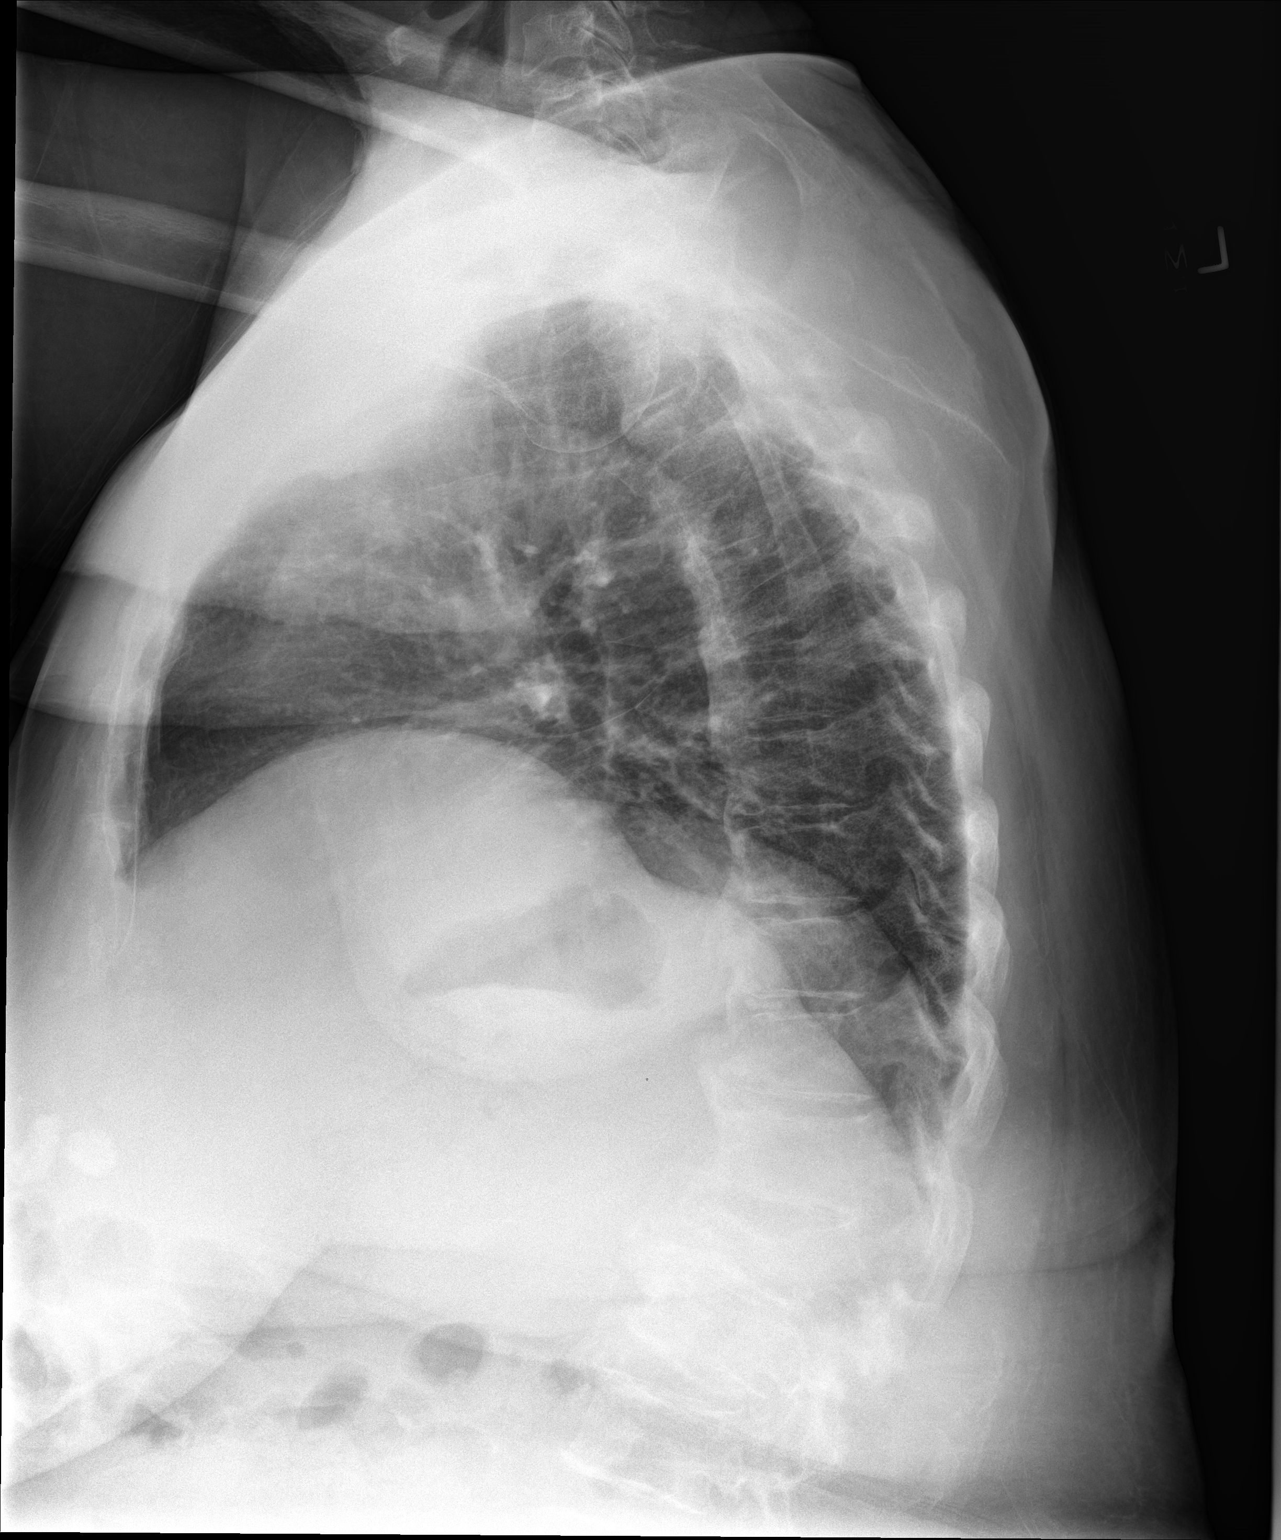

[2 of 2 positions shown; findings below may reference images not displayed]

FINDINGS: Central pulmonary vascular prominence without pulmonary edema.

Elevated hemidiaphragms.

No segmental consolidation or pneumothorax.

Heart size within normal limits.

Calcified slightly tortuous aorta.

Left breast calcifications.

Chronic T12 compression fracture with anterior wedging similar to
prior CT. Interval mild loss of height L1 vertebra.

Shoulder joint degenerative changes greater on left.
IMPRESSION: Central pulmonary vascular prominence without pulmonary edema.

Chronic T12 compression fracture with anterior wedging similar to
prior CT. Interval mild loss of height L1 vertebra.

Aortic Atherosclerosis ([id number]).

## 2019-12-31 IMAGING — US US PARACENTESIS
2 series · 7 of 7 positions shown · non-contrast
Comparison: none

INDICATION: Nonalcoholic micronodular cirrhosis of the liver, ascites

[Series 1: us paracentesis · 0.33mm/px · 3 of 3 slices shown (1 of 2)]
[im 1/3]
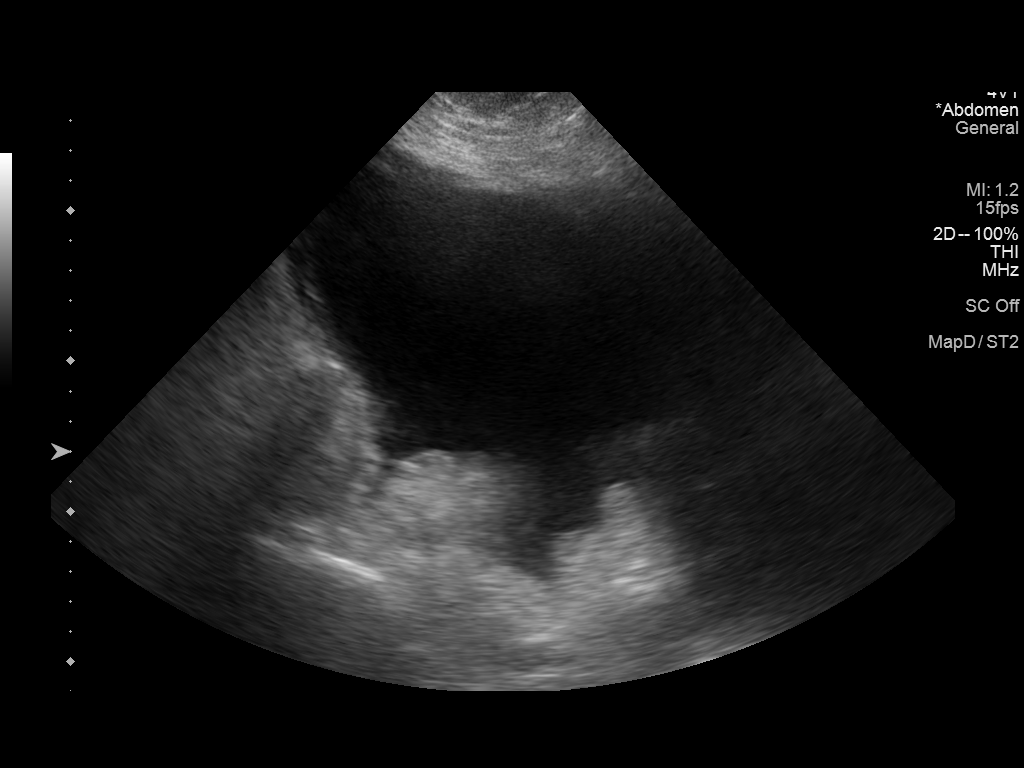
[im 2/3]
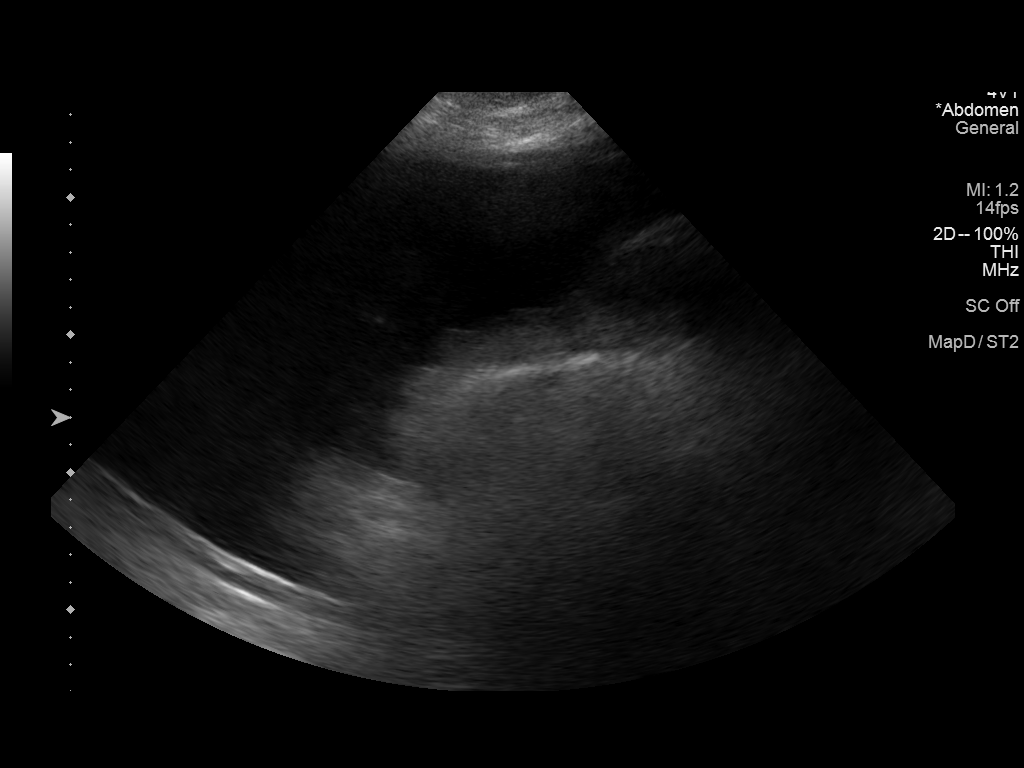
[im 3/3]
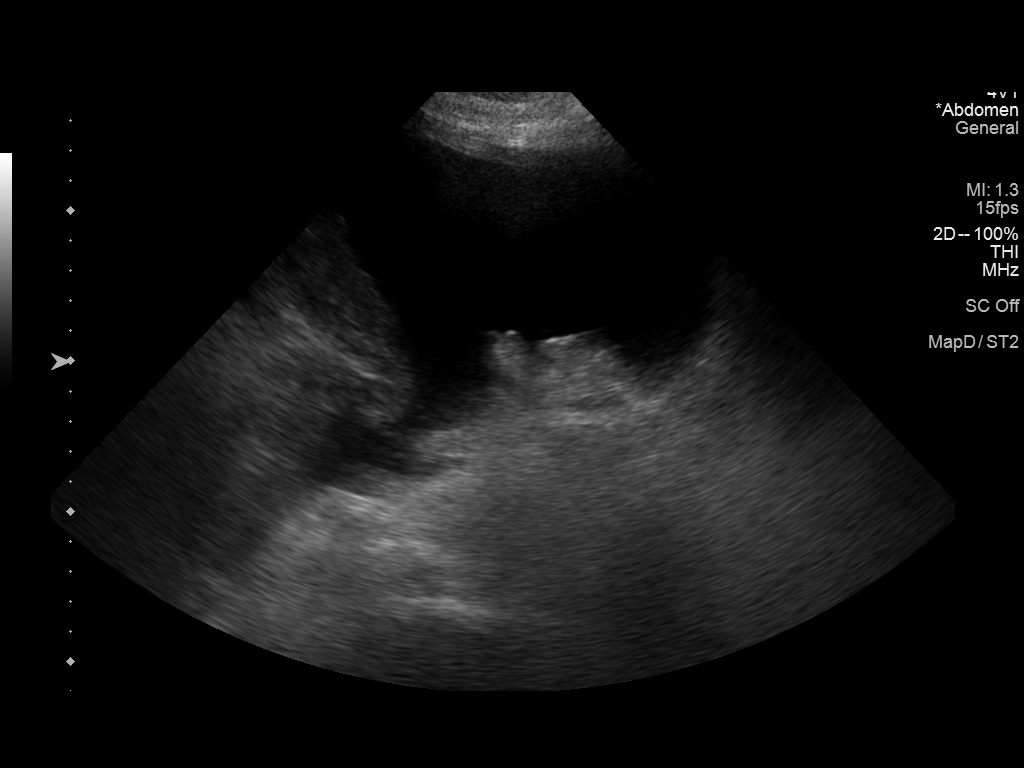

[Series 1: us paracentesis · 0.44mm/px · 4 of 4 slices shown (2 of 2)]
[im 1/4]
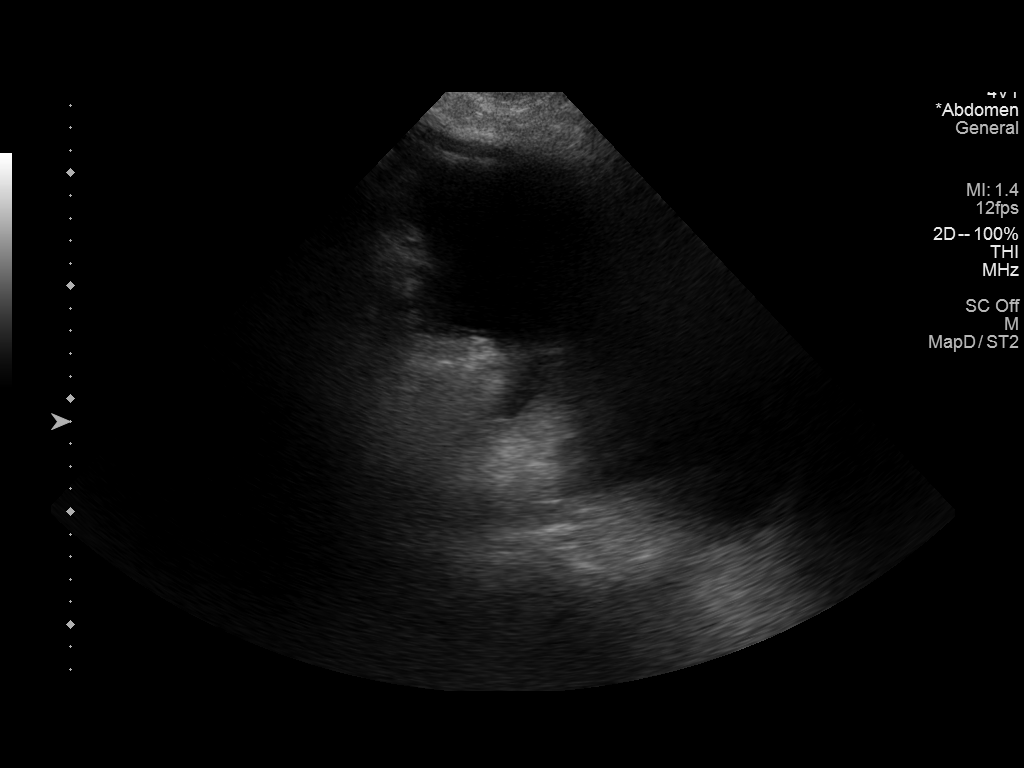
[im 2/4]
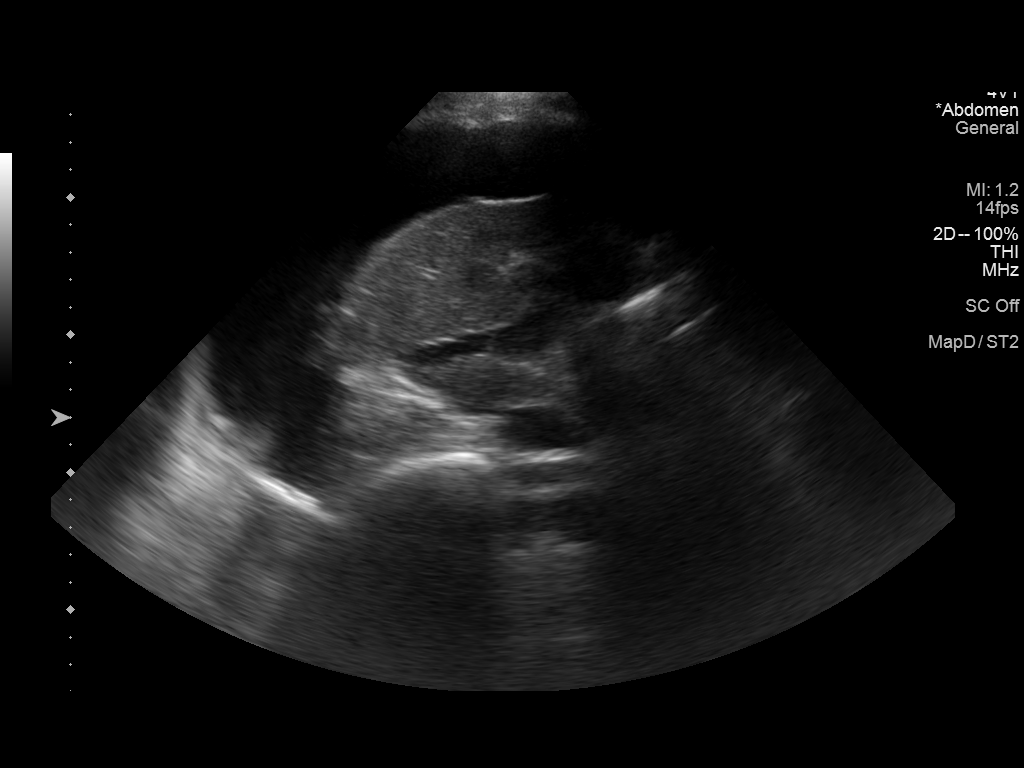
[im 3/4]
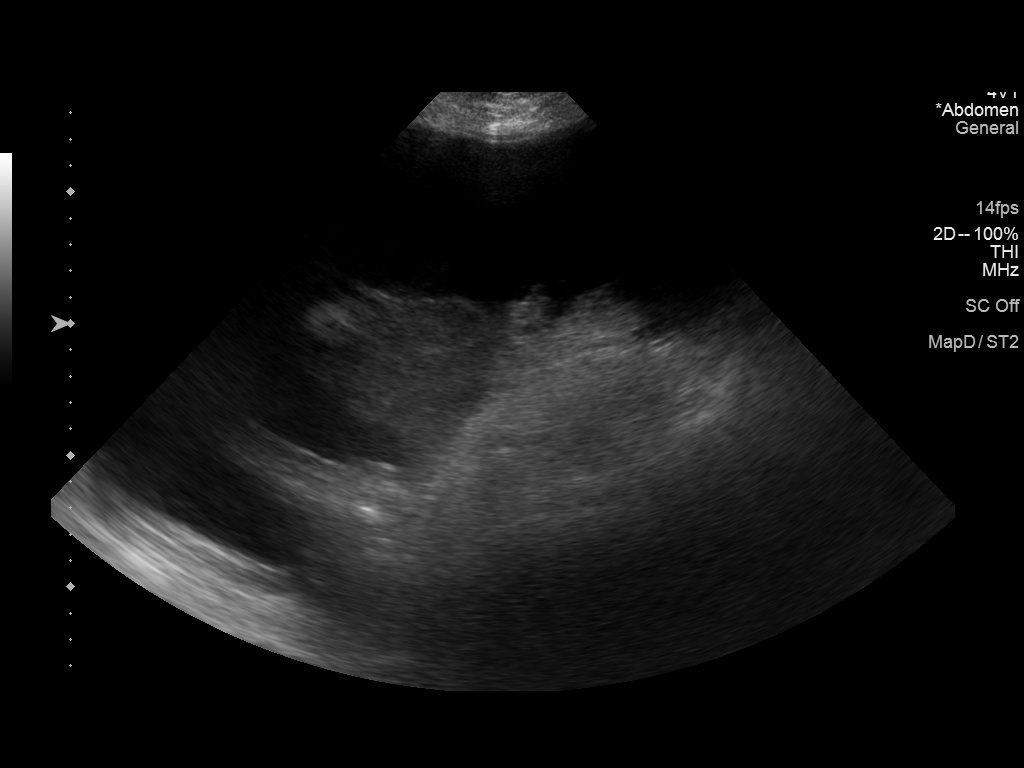
[im 4/4]
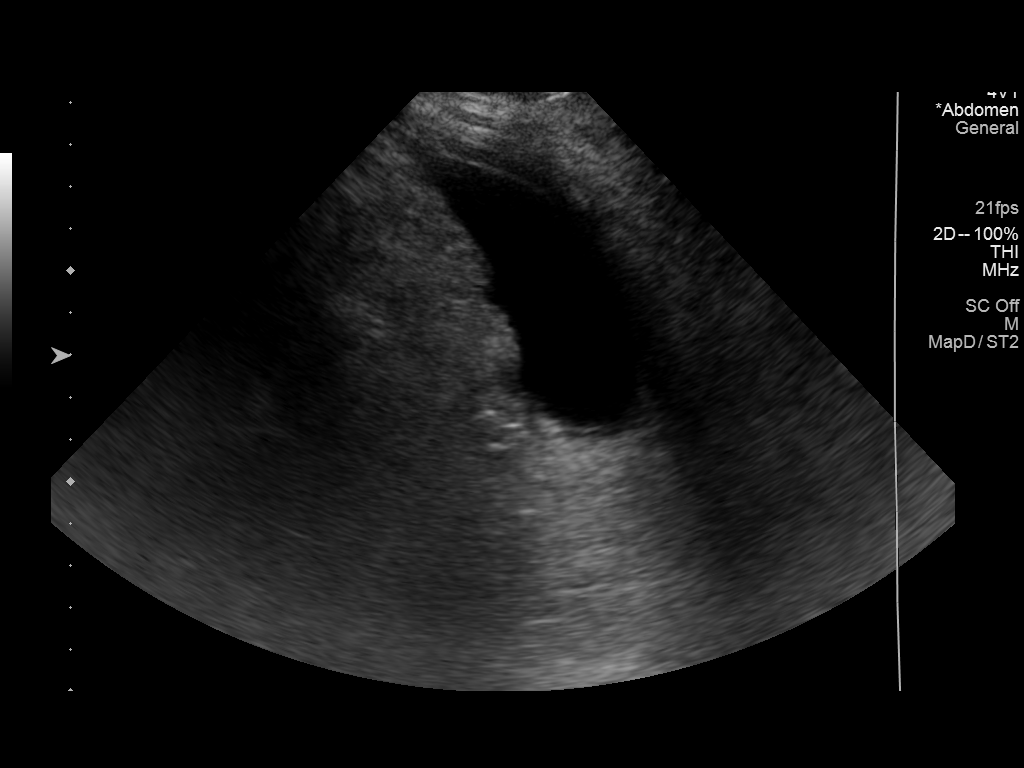

[7 of 7 positions shown; findings below may reference images not displayed]

EXAM:
ULTRASOUND GUIDED DIAGNOSTIC AND THERAPEUTIC PARACENTESIS

MEDICATIONS:
None.

COMPLICATIONS:
None immediate.

PROCEDURE:
Procedure, benefits, and risks of procedure were discussed with
patient.

Written informed consent for procedure was obtained.

Time out protocol followed.

Adequate collection of ascites localized by ultrasound in LEFT lower
quadrant.

Skin prepped and draped in usual sterile fashion.

Skin and soft tissues anesthetized with 10 mL of 1% lidocaine.

5 French Yueh catheter placed into peritoneal cavity.

2.5 L of yellow ascitic fluid aspirated by vacuum bottle suction.

Procedure tolerated well by patient without immediate complication.
FINDINGS: A total of approximately 2.5 L of ascitic fluid was removed.

Samples were sent to the laboratory as requested by the clinical
team.
IMPRESSION: Successful ultrasound-guided paracentesis yielding 2.5 liters of
peritoneal fluid.
